# Patient Record
Sex: Male | Born: 1950
Health system: Southern US, Community
[De-identification: ages and names within clinical notes are randomized; demographics above are authoritative.]

## PROBLEM LIST (undated history)

## (undated) DIAGNOSIS — K5790 Diverticulosis of intestine, part unspecified, without perforation or abscess without bleeding: Secondary | ICD-10-CM

## (undated) DIAGNOSIS — R55 Syncope and collapse: Secondary | ICD-10-CM

## (undated) DIAGNOSIS — I10 Essential (primary) hypertension: Secondary | ICD-10-CM

## (undated) DIAGNOSIS — D126 Benign neoplasm of colon, unspecified: Secondary | ICD-10-CM

## (undated) DIAGNOSIS — I639 Cerebral infarction, unspecified: Secondary | ICD-10-CM

## (undated) DIAGNOSIS — E785 Hyperlipidemia, unspecified: Secondary | ICD-10-CM

## (undated) DIAGNOSIS — K449 Diaphragmatic hernia without obstruction or gangrene: Secondary | ICD-10-CM

## (undated) DIAGNOSIS — G459 Transient cerebral ischemic attack, unspecified: Secondary | ICD-10-CM

## (undated) DIAGNOSIS — M1990 Unspecified osteoarthritis, unspecified site: Secondary | ICD-10-CM

## (undated) DIAGNOSIS — Z923 Personal history of irradiation: Secondary | ICD-10-CM

## (undated) DIAGNOSIS — T7840XA Allergy, unspecified, initial encounter: Secondary | ICD-10-CM

## (undated) DIAGNOSIS — C029 Malignant neoplasm of tongue, unspecified: Secondary | ICD-10-CM

## (undated) DIAGNOSIS — D496 Neoplasm of unspecified behavior of brain: Secondary | ICD-10-CM

## (undated) DIAGNOSIS — R011 Cardiac murmur, unspecified: Secondary | ICD-10-CM

## (undated) DIAGNOSIS — C801 Malignant (primary) neoplasm, unspecified: Secondary | ICD-10-CM

## (undated) DIAGNOSIS — I219 Acute myocardial infarction, unspecified: Secondary | ICD-10-CM

## (undated) DIAGNOSIS — I739 Peripheral vascular disease, unspecified: Secondary | ICD-10-CM

## (undated) DIAGNOSIS — J449 Chronic obstructive pulmonary disease, unspecified: Secondary | ICD-10-CM

## (undated) HISTORY — PX: TRACHEOSTOMY: SUR1362

## (undated) HISTORY — PX: APPENDECTOMY: SHX54

## (undated) HISTORY — PX: BRAIN SURGERY: SHX531

## (undated) HISTORY — DX: Syncope and collapse: R55

## (undated) HISTORY — DX: Diverticulosis of intestine, part unspecified, without perforation or abscess without bleeding: K57.90

## (undated) HISTORY — DX: Diaphragmatic hernia without obstruction or gangrene: K44.9

## (undated) HISTORY — DX: Malignant (primary) neoplasm, unspecified: C80.1

## (undated) HISTORY — DX: Unspecified osteoarthritis, unspecified site: M19.90

## (undated) HISTORY — PX: TRACHEOSTOMY CLOSURE: SHX458

## (undated) HISTORY — DX: Hyperlipidemia, unspecified: E78.5

## (undated) HISTORY — DX: Chronic obstructive pulmonary disease, unspecified: J44.9

## (undated) HISTORY — PX: CHOLECYSTECTOMY: SHX55

## (undated) HISTORY — PX: ORBITAL FRACTURE SURGERY: SHX725

## (undated) HISTORY — PX: HERNIA REPAIR: SHX51

## (undated) HISTORY — DX: Allergy, unspecified, initial encounter: T78.40XA

## (undated) HISTORY — DX: Benign neoplasm of colon, unspecified: D12.6

## (undated) HISTORY — DX: Cardiac murmur, unspecified: R01.1

---

## 1998-09-27 ENCOUNTER — Ambulatory Visit (HOSPITAL_COMMUNITY): Admission: RE | Admit: 1998-09-27 | Discharge: 1998-09-27 | Payer: Self-pay | Admitting: *Deleted

## 1999-02-26 ENCOUNTER — Encounter: Payer: Self-pay | Admitting: Emergency Medicine

## 1999-02-26 ENCOUNTER — Emergency Department (HOSPITAL_COMMUNITY): Admission: EM | Admit: 1999-02-26 | Discharge: 1999-02-26 | Payer: Self-pay | Admitting: Emergency Medicine

## 1999-10-19 ENCOUNTER — Ambulatory Visit (HOSPITAL_COMMUNITY): Admission: RE | Admit: 1999-10-19 | Discharge: 1999-10-19 | Payer: Self-pay | Admitting: *Deleted

## 1999-11-24 ENCOUNTER — Encounter (INDEPENDENT_AMBULATORY_CARE_PROVIDER_SITE_OTHER): Payer: Self-pay

## 1999-11-24 ENCOUNTER — Ambulatory Visit (HOSPITAL_COMMUNITY): Admission: RE | Admit: 1999-11-24 | Discharge: 1999-11-24 | Payer: Self-pay | Admitting: *Deleted

## 2000-01-16 ENCOUNTER — Encounter: Payer: Self-pay | Admitting: Emergency Medicine

## 2000-01-17 ENCOUNTER — Inpatient Hospital Stay (HOSPITAL_COMMUNITY): Admission: EM | Admit: 2000-01-17 | Discharge: 2000-01-17 | Payer: Self-pay | Admitting: Emergency Medicine

## 2000-01-17 ENCOUNTER — Encounter: Payer: Self-pay | Admitting: Cardiovascular Disease

## 2000-03-01 ENCOUNTER — Ambulatory Visit (HOSPITAL_COMMUNITY): Admission: RE | Admit: 2000-03-01 | Discharge: 2000-03-01 | Payer: Self-pay | Admitting: Cardiovascular Disease

## 2000-11-07 ENCOUNTER — Encounter: Admission: RE | Admit: 2000-11-07 | Discharge: 2000-11-07 | Payer: Self-pay | Admitting: Cardiovascular Disease

## 2000-11-07 ENCOUNTER — Encounter: Payer: Self-pay | Admitting: Cardiovascular Disease

## 2000-11-08 ENCOUNTER — Encounter: Payer: Self-pay | Admitting: Cardiovascular Disease

## 2000-11-08 ENCOUNTER — Ambulatory Visit (HOSPITAL_COMMUNITY): Admission: RE | Admit: 2000-11-08 | Discharge: 2000-11-08 | Payer: Self-pay | Admitting: Cardiovascular Disease

## 2001-06-08 ENCOUNTER — Emergency Department (HOSPITAL_COMMUNITY): Admission: EM | Admit: 2001-06-08 | Discharge: 2001-06-08 | Payer: Self-pay | Admitting: Emergency Medicine

## 2001-06-08 ENCOUNTER — Encounter: Payer: Self-pay | Admitting: Emergency Medicine

## 2001-08-26 ENCOUNTER — Ambulatory Visit (HOSPITAL_COMMUNITY): Admission: RE | Admit: 2001-08-26 | Discharge: 2001-08-26 | Payer: Self-pay | Admitting: Internal Medicine

## 2001-10-23 ENCOUNTER — Encounter: Payer: Self-pay | Admitting: General Surgery

## 2001-10-27 ENCOUNTER — Observation Stay (HOSPITAL_COMMUNITY): Admission: RE | Admit: 2001-10-27 | Discharge: 2001-10-28 | Payer: Self-pay | Admitting: General Surgery

## 2001-11-21 ENCOUNTER — Emergency Department (HOSPITAL_COMMUNITY): Admission: EM | Admit: 2001-11-21 | Discharge: 2001-11-21 | Payer: Self-pay | Admitting: Emergency Medicine

## 2002-01-05 ENCOUNTER — Encounter: Payer: Self-pay | Admitting: Cardiovascular Disease

## 2002-01-05 ENCOUNTER — Ambulatory Visit (HOSPITAL_COMMUNITY): Admission: RE | Admit: 2002-01-05 | Discharge: 2002-01-05 | Payer: Self-pay | Admitting: Cardiovascular Disease

## 2002-07-28 ENCOUNTER — Observation Stay (HOSPITAL_COMMUNITY): Admission: RE | Admit: 2002-07-28 | Discharge: 2002-07-29 | Payer: Self-pay | Admitting: General Surgery

## 2003-02-08 ENCOUNTER — Inpatient Hospital Stay (HOSPITAL_COMMUNITY): Admission: EM | Admit: 2003-02-08 | Discharge: 2003-02-09 | Payer: Self-pay | Admitting: Emergency Medicine

## 2003-02-08 ENCOUNTER — Encounter: Payer: Self-pay | Admitting: Emergency Medicine

## 2003-02-09 ENCOUNTER — Encounter: Payer: Self-pay | Admitting: Cardiovascular Disease

## 2004-08-30 ENCOUNTER — Encounter: Admission: RE | Admit: 2004-08-30 | Discharge: 2004-08-30 | Payer: Self-pay | Admitting: General Surgery

## 2005-07-16 ENCOUNTER — Inpatient Hospital Stay (HOSPITAL_COMMUNITY): Admission: AD | Admit: 2005-07-16 | Discharge: 2005-07-17 | Payer: Self-pay | Admitting: Cardiovascular Disease

## 2005-11-03 ENCOUNTER — Emergency Department (HOSPITAL_COMMUNITY): Admission: EM | Admit: 2005-11-03 | Discharge: 2005-11-03 | Payer: Self-pay | Admitting: Emergency Medicine

## 2006-06-17 ENCOUNTER — Encounter: Admission: RE | Admit: 2006-06-17 | Discharge: 2006-06-17 | Payer: Self-pay | Admitting: Cardiovascular Disease

## 2006-08-07 ENCOUNTER — Inpatient Hospital Stay (HOSPITAL_COMMUNITY): Admission: EM | Admit: 2006-08-07 | Discharge: 2006-08-08 | Payer: Self-pay | Admitting: Emergency Medicine

## 2007-04-07 ENCOUNTER — Emergency Department (HOSPITAL_COMMUNITY): Admission: EM | Admit: 2007-04-07 | Discharge: 2007-04-07 | Payer: Self-pay | Admitting: Emergency Medicine

## 2007-08-23 ENCOUNTER — Emergency Department (HOSPITAL_COMMUNITY): Admission: EM | Admit: 2007-08-23 | Discharge: 2007-08-23 | Payer: Self-pay | Admitting: Emergency Medicine

## 2007-09-10 ENCOUNTER — Ambulatory Visit: Payer: Self-pay | Admitting: Internal Medicine

## 2007-09-23 ENCOUNTER — Encounter: Payer: Self-pay | Admitting: Internal Medicine

## 2007-09-23 ENCOUNTER — Ambulatory Visit: Payer: Self-pay | Admitting: Internal Medicine

## 2007-11-09 ENCOUNTER — Emergency Department (HOSPITAL_COMMUNITY): Admission: EM | Admit: 2007-11-09 | Discharge: 2007-11-09 | Payer: Self-pay | Admitting: Family Medicine

## 2008-03-18 ENCOUNTER — Encounter (INDEPENDENT_AMBULATORY_CARE_PROVIDER_SITE_OTHER): Payer: Self-pay | Admitting: Cardiovascular Disease

## 2008-03-18 ENCOUNTER — Inpatient Hospital Stay (HOSPITAL_COMMUNITY): Admission: EM | Admit: 2008-03-18 | Discharge: 2008-03-19 | Payer: Self-pay | Admitting: Emergency Medicine

## 2008-05-07 ENCOUNTER — Emergency Department (HOSPITAL_COMMUNITY): Admission: EM | Admit: 2008-05-07 | Discharge: 2008-05-07 | Payer: Self-pay | Admitting: Emergency Medicine

## 2008-05-12 ENCOUNTER — Ambulatory Visit: Payer: Self-pay | Admitting: Surgery

## 2008-05-12 ENCOUNTER — Ambulatory Visit (HOSPITAL_COMMUNITY): Admission: RE | Admit: 2008-05-12 | Discharge: 2008-05-12 | Payer: Self-pay | Admitting: Cardiovascular Disease

## 2008-05-12 ENCOUNTER — Encounter (INDEPENDENT_AMBULATORY_CARE_PROVIDER_SITE_OTHER): Payer: Self-pay | Admitting: Cardiovascular Disease

## 2008-09-30 ENCOUNTER — Encounter: Admission: RE | Admit: 2008-09-30 | Discharge: 2008-09-30 | Payer: Self-pay | Admitting: Cardiovascular Disease

## 2008-10-26 ENCOUNTER — Emergency Department (HOSPITAL_COMMUNITY): Admission: EM | Admit: 2008-10-26 | Discharge: 2008-10-26 | Payer: Self-pay | Admitting: Emergency Medicine

## 2008-11-19 ENCOUNTER — Encounter: Admission: RE | Admit: 2008-11-19 | Discharge: 2008-11-19 | Payer: Self-pay | Admitting: Neurology

## 2009-02-16 ENCOUNTER — Encounter (HOSPITAL_COMMUNITY): Admission: RE | Admit: 2009-02-16 | Discharge: 2009-05-17 | Payer: Self-pay | Admitting: Cardiovascular Disease

## 2010-06-13 ENCOUNTER — Encounter: Admission: RE | Admit: 2010-06-13 | Discharge: 2010-06-13 | Payer: Self-pay | Admitting: Cardiovascular Disease

## 2010-06-22 ENCOUNTER — Encounter: Admission: RE | Admit: 2010-06-22 | Discharge: 2010-06-22 | Payer: Self-pay | Admitting: Cardiovascular Disease

## 2010-07-13 ENCOUNTER — Inpatient Hospital Stay (HOSPITAL_COMMUNITY): Admission: EM | Admit: 2010-07-13 | Discharge: 2010-07-14 | Payer: Self-pay | Admitting: Emergency Medicine

## 2010-10-13 ENCOUNTER — Ambulatory Visit: Payer: Self-pay | Admitting: Vascular Surgery

## 2010-12-24 ENCOUNTER — Encounter: Payer: Self-pay | Admitting: Cardiovascular Disease

## 2011-01-16 ENCOUNTER — Other Ambulatory Visit (HOSPITAL_COMMUNITY): Payer: Self-pay | Admitting: Cardiovascular Disease

## 2011-01-16 DIAGNOSIS — I2510 Atherosclerotic heart disease of native coronary artery without angina pectoris: Secondary | ICD-10-CM

## 2011-02-01 ENCOUNTER — Ambulatory Visit (HOSPITAL_COMMUNITY)
Admission: RE | Admit: 2011-02-01 | Discharge: 2011-02-01 | Disposition: A | Discharge status: Home / Self Care | Payer: Medicare PPO | Source: Ambulatory Visit | Attending: Cardiovascular Disease | Admitting: Cardiovascular Disease

## 2011-02-01 DIAGNOSIS — R079 Chest pain, unspecified: Secondary | ICD-10-CM | POA: Insufficient documentation

## 2011-02-01 DIAGNOSIS — I2510 Atherosclerotic heart disease of native coronary artery without angina pectoris: Secondary | ICD-10-CM

## 2011-02-01 MED ORDER — TECHNETIUM TC 99M TETROFOSMIN IV KIT
30.0000 | PACK | Freq: Once | INTRAVENOUS | Status: AC | PRN
  Administered 2011-02-01: 30 via INTRAVENOUS

## 2011-02-01 MED ORDER — TECHNETIUM TC 99M TETROFOSMIN IV KIT
10.0000 | PACK | Freq: Once | INTRAVENOUS | Status: AC | PRN
  Administered 2011-02-01: 10 via INTRAVENOUS

## 2011-02-16 LAB — CBC
Hemoglobin: 13.9 g/dL (ref 13.0–17.0)
Hemoglobin: 15.4 g/dL (ref 13.0–17.0)
MCHC: 33.4 g/dL (ref 30.0–36.0)
MCV: 97 fL (ref 78.0–100.0)
Platelets: 207 10*3/uL (ref 150–400)
RBC: 4.28 MIL/uL (ref 4.22–5.81)
RBC: 4.64 MIL/uL (ref 4.22–5.81)
WBC: 7.3 10*3/uL (ref 4.0–10.5)

## 2011-02-16 LAB — CARDIAC PANEL(CRET KIN+CKTOT+MB+TROPI)
CK, MB: 4 ng/mL (ref 0.3–4.0)
Relative Index: 3.7 — ABNORMAL HIGH (ref 0.0–2.5)
Total CK: 104 U/L (ref 7–232)
Troponin I: 0.02 ng/mL (ref 0.00–0.06)

## 2011-02-16 LAB — POCT CARDIAC MARKERS
CKMB, poc: 4 ng/mL (ref 1.0–8.0)
Troponin i, poc: <0.05 ng/mL (ref 0.00–0.09)

## 2011-02-16 LAB — URINALYSIS, ROUTINE W REFLEX MICROSCOPIC
Glucose, UA: 100 mg/dL — AB
Ketones, ur: NEGATIVE mg/dL
Nitrite: NEGATIVE
Protein, ur: NEGATIVE mg/dL
Urobilinogen, UA: 1 mg/dL (ref 0.0–1.0)

## 2011-02-16 LAB — POCT I-STAT, CHEM 8
Chloride: 107 mEq/L (ref 96–112)
HCT: 49 % (ref 39.0–52.0)
Potassium: 3.6 mEq/L (ref 3.5–5.1)

## 2011-02-16 LAB — DIFFERENTIAL
Eosinophils Relative: 4 % (ref 0–5)
Lymphocytes Relative: 34 % (ref 12–46)
Lymphs Abs: 2.5 10*3/uL (ref 0.7–4.0)

## 2011-02-16 LAB — URINE CULTURE: Colony Count: NO GROWTH

## 2011-02-16 LAB — LIPID PANEL
Total CHOL/HDL Ratio: 3.7 RATIO
VLDL: 15 mg/dL (ref 0–40)

## 2011-02-16 LAB — PROTIME-INR
INR: 0.93 (ref 0.00–1.49)
Prothrombin Time: 12.7 seconds (ref 11.6–15.2)

## 2011-02-16 LAB — HEPARIN LEVEL (UNFRACTIONATED): Heparin Unfractionated: 0.22 IU/mL — ABNORMAL LOW (ref 0.30–0.70)

## 2011-04-12 ENCOUNTER — Observation Stay (HOSPITAL_COMMUNITY): Payer: Medicare PPO

## 2011-04-12 ENCOUNTER — Observation Stay (HOSPITAL_COMMUNITY)
Admission: AD | Admit: 2011-04-12 | Discharge: 2011-04-13 | Disposition: A | Discharge status: Home / Self Care | Payer: Medicare PPO | Source: Ambulatory Visit | Attending: Cardiovascular Disease | Admitting: Cardiovascular Disease

## 2011-04-12 DIAGNOSIS — I10 Essential (primary) hypertension: Secondary | ICD-10-CM | POA: Insufficient documentation

## 2011-04-12 DIAGNOSIS — R0602 Shortness of breath: Secondary | ICD-10-CM | POA: Insufficient documentation

## 2011-04-12 DIAGNOSIS — I739 Peripheral vascular disease, unspecified: Secondary | ICD-10-CM | POA: Insufficient documentation

## 2011-04-12 DIAGNOSIS — J4489 Other specified chronic obstructive pulmonary disease: Principal | ICD-10-CM | POA: Insufficient documentation

## 2011-04-12 DIAGNOSIS — R609 Edema, unspecified: Secondary | ICD-10-CM | POA: Insufficient documentation

## 2011-04-12 DIAGNOSIS — F411 Generalized anxiety disorder: Secondary | ICD-10-CM | POA: Insufficient documentation

## 2011-04-12 DIAGNOSIS — J449 Chronic obstructive pulmonary disease, unspecified: Secondary | ICD-10-CM | POA: Insufficient documentation

## 2011-04-12 DIAGNOSIS — Z8673 Personal history of transient ischemic attack (TIA), and cerebral infarction without residual deficits: Secondary | ICD-10-CM | POA: Insufficient documentation

## 2011-04-12 DIAGNOSIS — E785 Hyperlipidemia, unspecified: Secondary | ICD-10-CM | POA: Insufficient documentation

## 2011-04-12 DIAGNOSIS — M7989 Other specified soft tissue disorders: Secondary | ICD-10-CM

## 2011-04-12 DIAGNOSIS — F17200 Nicotine dependence, unspecified, uncomplicated: Secondary | ICD-10-CM | POA: Insufficient documentation

## 2011-04-12 LAB — COMPREHENSIVE METABOLIC PANEL
AST: 26 U/L (ref 0–37)
BUN: 9 mg/dL (ref 6–23)
CO2: 26 mEq/L (ref 19–32)
Calcium: 9.3 mg/dL (ref 8.4–10.5)
Creatinine, Ser: 0.89 mg/dL (ref 0.4–1.5)
GFR calc Af Amer: >60 mL/min (ref >60)
GFR calc non Af Amer: >60 mL/min (ref >60)
Glucose, Bld: 90 mg/dL (ref 70–99)
Total Bilirubin: 0.5 mg/dL (ref 0.3–1.2)

## 2011-04-12 LAB — CARDIAC PANEL(CRET KIN+CKTOT+MB+TROPI)
Total CK: 161 U/L (ref 7–232)
Troponin I: <0.3 ng/mL (ref <0.30)

## 2011-04-12 LAB — CBC
MCV: 95 fL (ref 78.0–100.0)
Platelets: 225 10*3/uL (ref 150–400)
RBC: 5.05 MIL/uL (ref 4.22–5.81)
WBC: 8.5 10*3/uL (ref 4.0–10.5)

## 2011-04-12 LAB — DIFFERENTIAL
Basophils Absolute: 0.1 10*3/uL (ref 0.0–0.1)
Eosinophils Absolute: 0.3 10*3/uL (ref 0.0–0.7)
Lymphocytes Relative: 33 % (ref 12–46)
Lymphs Abs: 2.8 10*3/uL (ref 0.7–4.0)
Neutrophils Relative %: 50 % (ref 43–77)

## 2011-04-12 LAB — TSH: TSH: 1.041 u[IU]/mL (ref 0.350–4.500)

## 2011-04-12 LAB — PRO B NATRIURETIC PEPTIDE: Pro B Natriuretic peptide (BNP): 58.3 pg/mL (ref 0–125)

## 2011-04-13 LAB — LIPID PANEL
HDL: 67 mg/dL (ref >39)
Total CHOL/HDL Ratio: 3 RATIO
VLDL: 16 mg/dL (ref 0–40)

## 2011-04-13 LAB — CBC
MCH: 33.3 pg (ref 26.0–34.0)
MCHC: 35.2 g/dL (ref 30.0–36.0)
Platelets: 197 10*3/uL (ref 150–400)
RBC: 4.99 MIL/uL (ref 4.22–5.81)
RDW: 13.7 % (ref 11.5–15.5)

## 2011-04-13 LAB — MAGNESIUM: Magnesium: 1.8 mg/dL (ref 1.5–2.5)

## 2011-04-13 LAB — BASIC METABOLIC PANEL
BUN: 8 mg/dL (ref 6–23)
CO2: 29 mEq/L (ref 19–32)
Chloride: 102 mEq/L (ref 96–112)
Glucose, Bld: 98 mg/dL (ref 70–99)
Potassium: 3.8 mEq/L (ref 3.5–5.1)

## 2011-04-13 LAB — CARDIAC PANEL(CRET KIN+CKTOT+MB+TROPI)
CK, MB: 2.8 ng/mL (ref 0.3–4.0)
Troponin I: <0.3 ng/mL (ref <0.30)

## 2011-04-13 LAB — HEPARIN LEVEL (UNFRACTIONATED): Heparin Unfractionated: 0.19 IU/mL — ABNORMAL LOW (ref 0.30–0.70)

## 2011-04-14 NOTE — Discharge Summary (Signed)
NAME:  [name], [name]                ACCOUNT NO.:  [id number]  MEDICAL RECORD NO.:  [id number]           PATIENT TYPE:  O  LOCATION:  2008                         FACILITY:  [facility]  PHYSICIAN:  Ricki Rodriguez, M.D.  DATE OF BIRTH:  28-Nov-1951  DATE OF ADMISSION:  04/12/2011 DATE OF DISCHARGE:  04/13/2011                              DISCHARGE SUMMARY   FINAL DIAGNOSES: 1. Chronic obstructive lung disease. 2. Shortness of breath. 3. Chronic leg edema. 4. Hypertension. 5. Tobacco use disorder. 6. Anxiety. 7. Status post cerebrovascular accident. 8. Hyperlipidemia. 9. Peripheral vascular disease.  DISCHARGE MEDICATIONS: 1. Xanax 0.25 mg one daily as needed. 2. Hydrochlorothiazide 12.5 mg 1 capsule daily. 3. Lisinopril 20 mg one daily. 4. Potassium chloride 10 mEq one daily. 5. Simvastatin 20 mg one at bedtime. 6. Aspirin 325 mg one daily. 7. Azor 5/20 mg one daily. 8. Cilostazol 100 mg one twice daily. 9. Clonidine 0.2 mg one twice daily. 10.Edarbi 80 mg one daily. 11.Isosorbide mononitrate 60 mg one daily. 12.Metoprolol tartrate 100 mg twice daily. 13.Nitroglycerin 0.4 mg one sublingual q. 5 minutes x3 as needed for     chest pain. 14.Vitamin D2 one daily. 15.The patient to discontinue taking Bystolic.  DISCHARGE DIET:  Low-sodium heart-healthy diet.  DISCHARGE ACTIVITY:  The patient to increase activity gradually as tolerated.  FOLLOWUP:  By Dr. Orpah Cobb in 2 weeks.  ADDITIONAL INSTRUCTIONS:  The patient to wear support stockings and take medications regularly and avoid salt in the diet.  HISTORY:  This 60 year old white male presented with leg edema and pain in the calf along with some shortness of breath.  The patient had a past history of coronary artery disease, hypertension, peripheral vascular disease and old cerebrovascular accident with left-sided weakness.  PHYSICAL EXAMINATION:  VITAL SIGNS:  Pulse 70, respirations 16, blood pressure 124/85, height  5 feet 8 inch, weight 194 pounds, body mass index of 29.5. HEENT:  The patient is normocephalic, atraumatic with hazel eyes. Conjunctivae pink.  Sclerae nonicteric.  He wears dentures and reading glasses.  Tongue pink and midline. NECK:  No JVD, no carotid bruit.  Full range of motion. LUNGS:  Clear bilaterally. HEART:  Normal S1-S2. ABDOMEN:  Soft and nontender but distended. EXTREMITIES:  No cyanosis, positive clubbing.  Positive 2+ edema. CNS:  Cranial nerves grossly intact.  The patient is right-handed. SKIN:  Warm and dry.  LABORATORY DATA:  Normal hemoglobin/hematocrit, WBC count, platelet count.  D-dimer borderline at 0.49.  B-natriuretic peptide normal at 58. Electrolytes showed potassium of 3.1, subsequent potassium was 3.8 after replacement.  BUN and creatinine normal.  Glucose normal.  CK-MB, troponin I normal x3.  Thyroid stimulating hormone level was 1.041. Lipid profile showed cholesterol of 200, HDL cholesterol of 67, LDL cholesterol of 117, magnesium level was 1.8.  Chest x-ray showed no acute cardiopulmonary disease.  HOSPITAL COURSE:  The patient was admitted to telemetry floor.  He underwent a 2-D echocardiogram that showed preserved LV systolic function with mild left ventricular hypertrophy and mild dilatation of the aortic root.  Doppler of his lower extremity was negative for DVT. His  leg edema subsided by elevation of the legs.  He was advised to wear support stocking, decrease salt and fluid intake and get followup by me in 2 weeks.     Ricki Rodriguez, M.D.     ASK/MEDQ  D:  04/13/2011  T:  04/14/2011  Job:  [id number]  Electronically Signed by Orpah Cobb M.D. on 04/14/2011 02:24:13 PM

## 2011-04-17 NOTE — Consult Note (Signed)
NEW PATIENT CONSULTATION   [name], [name]  DOB:  Oct 19, 1951                                       10/13/2010  BJYNW#:[id number]   REASON FOR CONSULTATION:  Bilateral cold feet.   HISTORY OF PRESENT ILLNESS:  This is a 60 year old gentleman with chief  complaint of bilateral cold feet.  He has had these symptoms for about 2  years he says.  He says that with drop in temperature he notes that his  feet change colors.  At one point last year previously, they turned  purple and he developed pain in his feet.  He is not absolutely certain  whether or not he developed any anesthesia or paresthesia during this  episode.  He denies any claudication symptoms or rest pain symptoms.  Mainly his complaint of severe coldness in his legs during these  episodes.  He denies any known previous connective tissue disorder and  no autoimmune disorders.  Additionally,  he denies ever been diagnosed  with any type of Raynaud's phenomenon.   PAST MEDICAL HISTORY:  1. Hypertension.  2. History of stroke.  3. Skin cancer in his left ear.  4. History of left eye trauma.  5. Coronary artery disease.   PAST SURGICAL HISTORY:  Included a cardiac catheterization, hiatal  hernia surgery of some type, cholecystectomy, appendectomy and left eye  surgery.   SOCIAL HISTORY:  He does have a significant smoking history with greater  than 40 pack year history of smoking, continuing to smoke at least 1  pack a day.  He drinks about 6 Beers a month.   MEDICATIONS:  Include metoprolol, Pletal, Azor, Plavix, clonidine,  Tylenol,  isosorbide ER, nitroglycerin.   ALLERGIES:  Codeine.   REVIEW OF SYSTEMS:  He noted weight gain, headache, pain in legs when  walking, pain in feet when lying flat, mini-strokes, slurred speech,  bronchitis, chest tightness, pressure, shortness breath with lying flat,  shortness breath with exertion, hiatal hernia.   PHYSICAL EXAMINATION:  Vitals: Blood pressure  __________ , heart rate  78, respirations were 12, satting 99% in room air.  General: He is well-developed, well-nourished, no apparent distress.  Head:  Normocephalic, atraumatic.  ENT:  Hearing was grossly intact.  Nares without any erythema or  drainage.  Oropharynx had no erythema or exudate.  Neck: supple neck without nuchal rigidity, no JVD  Pulmonary:  Clear auscultation bilaterally, symmetric expansion.  No  rales, rhonchi or wheezing.  Cardiac:  Regular rate and rhythm.  Normal S1 and S2.  He had a grade  2/6 murmur in multiple listening areas.  No rubs, thrills or gallops.  Vascular:  Palpable upper extremity pulses throughout.  Carotids were  palpable and had no bruits on either side.  He had palpable femoral  pulses.  I do not appreciate popliteals but he had easily palpable  dorsalis pedis pulses bilaterally and there were faint posterior tibial  pulses bilaterally.  GI:  He had a soft abdomen, nontender, nondistended, no guarding.  No  hepatosplenomegaly. No obvious masses.  Musculoskeletal: All extremities had 5/5 strength.  There were no  obvious ischemic changes in either lower extremities.  No signs of any  type of gangrene or ulceration in any extremities.  Neurologic:  Cranial nerves II-XII were intact.  Motor exam was as  listed.  He had intact sensation  in all extremities include the lower  extremities.  Psychiatric:  Judgment appear to be intact.  Mood and affect were  appropriate for clinical situation.  Skin and extremities:  As listed above.  Otherwise there was no obvious  rash in the body.  Lymphatics:  No cervical, axillary, inguinal lymphadenopathy.   NON-INVASIVE VASCULAR IMAGING:  He had bilateral ABI's completed which  demonstrate on the right side 0.97 and on the left 1.31. Looking at the  waveforms, there were biphasic waveforms in the right posterior tibial  and anterior tibial.  On the left side, similarly there was a biphasic  waveform in the  posterior tibial and anterior tibial.  Also he had toe  pressures completed which gave a toe brachial index of 0.74 on the right  side and 0.78 on the left side.   MEDICAL DECISION MAKING:  This is a 60 year old gentleman who by history  has what is suggestive of possible Raynaud phenomenon.  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  Unfortunately, in most cases of primary, workup fails to reveal exact  etiology.  His eliciting trigger seems to be the cold weather, so  obviously I recommended to him that he minimize his exposure to the cold  weather, wearing thermal undergarments as necessary to avoid this.  Additionally if  he develops a severe episode of vasospastic behavior  and his legs turn blue, I have instructed him to come immediately to the  clinic to be seen.  I will, at that point, repeat ABIs to document the  severity of this and then this would also be the point of highest yield  for obtaining labwork in this patient.  I think at this point with  normal ABIs and normal toe pressures it would be low yield to draw the  labs or do any additional workup at this point.  I also stressed to him  the importance of smoking cessation.  We talked about 5 minutes about  the need to terminate his smoking.  He was not interested in any  counseling or pharmacologic assistance at this point.  We discussed this  plan.  He is fine with it at this point and he will follow up with [place] if  he has a severe exacerbation of his likely primary Raynaud's phenomena.     Leonides Sake, MD  Electronically Signed   BC/MEDQ  D:  10/13/2010  T:  10/16/2010  Job:  [id number]   cc:   Ricki Rodriguez, M.D.

## 2011-04-17 NOTE — Consult Note (Signed)
NAME:  DIAMANTE, RUBIN                ACCOUNT NO.:  [id number]   MEDICAL RECORD NO.:  [id number]          PATIENT TYPE:  INP   LOCATION:  4731                         FACILITY:  [facility]   PHYSICIAN:  Pramod P. Pearlean Brownie, MD    DATE OF BIRTH:  [birth year]   DATE OF CONSULTATION:  DATE OF DISCHARGE:                                 CONSULTATION   REASON FOR REFERRAL:  Stroke.   HISTORY OF PRESENT ILLNESS:  Mr. Rhoads is a 60 year old Caucasian male  who walked in this morning to Dr. Roseanne Kaufman office complaining of chest  pain and left hand weakness.  He was referred to the emergency room when  Dr. Algie Coffer found significant left-sided weakness.  He thought the  patient's symptoms began at 7:30 and hence he called me and I advised  him to call a Code Stroke.  However, when I arrived and examined the  patient, he told me clearly that his symptoms began this morning when he  woke up from sleep at around 7:00 a.m., he went to sleep at 930.  The  patient's symptoms were clearly beyond 3 hours and he is not a candidate  for aggressive intervention and Code Stroke was cancelled.  The patient  has no known prior history of stroke, TIA, seizures or significant  neurological problems.  He did have a brain surgery at 66 months of age  for presumed brain hemorrhage.  He also had some orbital hematoma  following a blunt head trauma for which he required surgery in 1970.   PAST MEDICAL HISTORY:  Significant for coronary artery disease,  myocardial infarction, hypertension.   HOME MEDICATIONS:  Aspirin, Lipitor, Lopressor, Norvasc.   ALLERGIES TO MEDICATIONS:  CODEINE.   FAMILY HISTORY:  Noncontributory.   SOCIAL HISTORY:  Lives in [place].  He is currently smoking.  Does  drink alcohol occasionally.  Denies doing drugs.  He is independent in  activities of daily living.   REVIEW OF SYSTEMS:  Positive for weakness, numbness, chest pain.   PHYSICAL EXAMINATION:  GENERAL:  Reveals a pleasant,  middle-aged  Caucasian male who is not in distress, afebrile, temperature 96.5, blood  pressure is 160/110, heart rate is 79 per minute regular, respiratory  rate 20 per minute, sats 96% on room air.  HEAD:  Nontraumatic.  NECK:  Supple.  There is no bruit.  ENT exam unremarkable.  CARDIAC:  No murmur or gallop.  LUNGS:  Clear to auscultation.  ABDOMEN:  Soft, nontender.  NEUROLOGICAL:  The patient is awake, alert.  He is oriented to time,  place and person.  There is no aphasia, apraxia or dysarthria and eye  movements are full range.  There is no facial weakness.  Tongue is  midline.  MOTOR SYSTEM EXAM:  Reveals no upper extremity drift, but he clearly has  significant weakness in the left grip and intrinsic hand muscles.  Fine  finger movements is diminished on the left.  He orbits the right over  left upper extremity.  He has left lower extremity drift.  He has mild  weakness of hip flexors and  ankle dorsiflexors.  He has decreased  sensation in the left hemibody.  On NIH stroke scale, he scored 3.   DIAGNOSTIC DATA:  Data reviewed noncontrast CAT scan of the head shows  no acute abnormality.   Admission Labs are pending at this time.   IMPRESSION:  A 56-year gentleman with sudden onset of left hemiparesis  likely due to right subcortical infarct.  He is also having ongoing  chest pain and coronary ischemia and also needs to be ruled out.   PLAN:  The patient is clearly not a candidate for thrombolysis due to  time of onset being beyond 3 hours.  He will be admitted for stroke  workup.  Keep him on telemetry monitoring.  Check MRI scan of the brain  with MRA of the brain, carotid ultrasound, transcranial Doppler studies,  fasting lipid profile, hemoglobin A1c, and homocystine.  Consider  changing aspirin to Plavix for secondary stroke prevention if the  patient can afford it.  IV heparin for chest pain treatment as per Dr.  Algie Coffer. I will happy to follow the patient in  consult.  Kindly call for  questions.           ______________________________  Sunny Schlein. Pearlean Brownie, MD     PPS/MEDQ  D:  03/18/2008  T:  03/19/2008  Job:  [id number]

## 2011-04-20 NOTE — H&P (Signed)
NAME:  [name], [name]                ACCOUNT NO.:  [id number]   MEDICAL RECORD NO.:  [id number]          PATIENT TYPE:  INP   LOCATION:  4735                         FACILITY:  [facility]   PHYSICIAN:  Ricki Rodriguez, M.D.  DATE OF BIRTH:  [birth year]   DATE OF ADMISSION:  07/16/2005  DATE OF DISCHARGE:                                HISTORY & PHYSICAL   CHIEF COMPLAINT:  Chest pain.   HISTORY OF PRESENT ILLNESS:  This 60 year old white male had exertional  dyspnea with chest pain for three days with left hand numbness. No history  of nausea and no nitroglycerin use.   PAST MEDICAL HISTORY:  Negative for diabetes. Positive for hypertension.  Positive for smoking one pack of cigarettes a day for 35 years. Positive for  alcohol use. Negative for elevated cholesterol level, myocardial infarction,  obesity, exercise, or family history of premature coronary artery disease.   PAST SURGICAL HISTORY:  1.  Gallbladder surgery in 1996.  2.  Left eye surgery in 1994.  3.  Appendectomy in the 1970s.  4.  Subdural hematoma evacuation at age 22 months.  5.  Polyp removal by Dr. Dortha Kern in 2001.   MEDICATIONS:  1.  Ecotrin 325 mg one daily.  2.  Lopressor 25 mg b.i.d.  3.  Lipitor 10 mg one daily.   ALLERGIES:  CODEINE.   PERSONAL HISTORY:  The patient is married x2. One son died at age 52 years.  He has one daughter at 18 years of age and another daughter 64 years of age.  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   FAMILY HISTORY:  Parents not known. Had two half brothers and one sister.   REVIEW OF SYSTEMS:  Denies weight gain or weight loss. Wears glasses. No  cataract surgery. Denies hearing loss. Tinnitus, rhinorrhea. Wears full  upper dentures. No history of cough, hemoptysis, asthma, or pneumonia. No  history of palpitations, dizziness or chest pain. No history of nausea,  vomiting, diarrhea, or constipation. Positive history of chronic  obstructive  lung disease, leg claudication, draining from the bowels, stomach ulcer,  hiatal hernia, history of blood transfusion, and hematuria. No history of  hepatitis, kidney stones, strokes, seizures, or psychiatric admission.  Positive history of joint pain. History of tetanus diphtheria shot in 2000.  No history of flu shot or pneumonia shot.   PHYSICAL EXAMINATION:  VITAL SIGNS: Pulse 80, respirations 20, blood  pressure 160/70, height 5 feet 8 inches, weight 168 pounds.  HEENT: Patient is normocephalic and atraumatic with hazel eyes. Conjunctiva  pink. Sclerae nonicteric. Mucous membranes pink and moist.  NECK: No JVD, no carotid bruits.  LUNGS: Clear bilaterally.  HEART: Normal S1 and S2 with a grade 2/6 systolic murmur.  ABDOMEN: Soft and nontender.  EXTREMITIES: No clubbing, cyanosis, or edema.  CNS: Cranial nerves II-XII grossly intact and the patient moves all four  extremities.   LABORATORY DATA:  Pending.   EKG reveals normal sinus rhythm with high voltage QRS.   ASSESSMENT:  1.  Chest  pain. Rule out myocardial infarction.  2.  Hypertension.  3.  Chronic obstructive lung disease.   PLAN:  1.  Admit the patient to telemetry bed.  2.  Check serial CK-MB.  3.  Schedule the patient for cardiac catheterization.      Ricki Rodriguez, M.D.  Electronically Signed     ASK/MEDQ  D:  07/17/2005  T:  07/17/2005  Job:  [id number]

## 2011-04-20 NOTE — Discharge Summary (Signed)
NAME:  [name], [name]                ACCOUNT NO.:  [id number]   MEDICAL RECORD NO.:  [id number]          PATIENT TYPE:  INP   LOCATION:  4735                         FACILITY:  [facility]   PHYSICIAN:  Ricki Rodriguez, M.D.  DATE OF BIRTH:  1951/01/01   DATE OF ADMISSION:  07/16/2005  DATE OF DISCHARGE:  07/17/2005                                 DISCHARGE SUMMARY   PRINCIPAL DIAGNOSIS:  1.  Chest pain.  2.  Chronic obstructive lung disease.  3.  Hypertension.  4.  Coronary artery disease.  5.  Abnormal electrocardiogram.   DISCHARGE MEDICATIONS:  Aspirin 81 mg one daily, Lopressor 25 mg one b.i.d.,  Lipitor 20 mg one daily.   DISCHARGE INSTRUCTIONS:  Diet low fat, low salt diet.  Activities:  Increase  slowly.  May shower after 24 hours and no sexual activity for one week.  Follow up with Dr. Orpah Cobb in 1-2 weeks.  Referral to smoking  cessation.  The patient will notify for right groin pain, swelling, or  discharge.   CONDITION ON DISCHARGE:  Improved.   HISTORY:  This 60 year old white male had exertional dyspnea with chest pain  for 3-4 days without history of nausea or nitroglycerin use.  His past  medical history is positive for hypertension, smoking one pack cigarettes  per day for 35 years, alcohol use, no history of diabetes, elevated  cholesterol level, or myocardial infarction.   PHYSICAL EXAMINATION:  Pulse 80, respirations 20, blood pressure 160/70,  height 5 feet 8 inches, weight 168 pounds.  HEENT:  The patient is  normocephalic, atraumatic, with hazel eyes, conjunctivae pink, sclerae  nonicteric, mucous membranes pink and moist.  Neck:  No JVD, no carotid  bruit.  Lungs clear bilaterally.  Heart:  Normal S1 and S2, grade 2/6  systolic murmur.  Abdomen:  Soft and nontender.  Extremities:  No edema,  cyanosis, or clubbing.  CNS:  Cranial nerves grossly intact, the patient  moves all four extremities.   LABORATORY DATA:  Normal hemoglobin, hematocrit, near normal  WBC count and  platelet count.  Normal electrolytes, BUN, creatinine, and liver enzymes.  Normal CK, MB, troponin I.  Cholesterol 187, triglycerides elevated at 273,  LDL cholesterol 79, HDL cholesterol 53.  Chest x-ray no acute disease.  Cardiac catheterization showed minimal one vessel disease with mild left  ventricular systolic dysfunction.   HOSPITAL COURSE:  The patient was admitted to the telemetry unit, myocardial  infarction was ruled out.  He underwent cardiac catheterization that failed  to show significant coronary artery disease.  He had mild left ventricular  systolic dysfunction.  The patient was discharged home in satisfactory  condition with follow up by me in one week.      Ricki Rodriguez, M.D.  Electronically Signed     ASK/MEDQ  D:  10/12/2005  T:  10/12/2005  Job:  [id number]

## 2011-04-20 NOTE — Op Note (Signed)
[place]  Patient:    [name]                        MRN: [id number] Proc. Date: 10/19/99 Adm. Date:  [id number] Attending:  Sharyn Dross                           Operative Report  REFERRING PHYSICIAN:  Dortha Kern, Montez Hageman., M.D.  OBJECTIVELY:  This pleasant 60 year old gentleman presented to the office with complaints of tarry stools, that was present at that time.  He was seen and evaluated and underwent a flexible sigmoidoscope, which appeared to be unremarkable at this time.  He was subsequently scheduled for an endoscopy to rule out any evidence of any occult process that may be ongoing.  During the process, the patient had some abdominal pains and discomforts, but appeared to be mild in nature.  He had more burning sensation that was noted.  OBJECTIVE FINDINGS:  He is a pleasant gentleman, who appears to be in no acute distress.  His vital signs are stable.  His HEENT examination is anicteric. NECK was supple.  LUNGS are clear.  HEART had a regular rate and rhythm without heaves, thrills, murmurs or gallops.  The ABDOMEN was soft, no tenderness, no hepatosplenomegaly appreciated.  EXTREMITIES are unremarkable.  PLAN:  I am going to presently schedule the patient for an endoscopic examination. Presently going to proceed with the endoscopic examination at this time.  INFORMED CONSENT:  The patient was advised of the procedure, indications and the risks involved.  The patient has agreed to have the procedure performed.  Video was viewed and consent form was obtained.  PREOPERATIVE PREPARATION:  The patient is brought in the endoscopy unit with IV, where IV conscious sedating medication was started.  A monitor is placed on the  patient to monitor the patients vital signs and oxygen saturation.  Nasal oxygen at 2 L/min was used and after adequate sedation was performed, the procedure was begun.  PROCEDURE NOTE:  The  instrument is advanced, patient lying in left lateral position via direct technique without difficulty.  The oropharyngeal, epiglottis, vocal cords, and piriform sinuses appeared to be grossly within normal limits.  The esophagus was normal without any evidence of acute inflammation, ulcerations, hiatal hernia or varices appreciated.  The gastric area showed a normal mucosa lake without any type of appearance of he gastric body and antral area that was noted.  This is consistent with a possible mild inflammatory process that was present.  There was no evidence of any acute  ulcerations that was noted at this time or erosions appreciated.  The pylorus is normal with good peristaltic activity and upon advancing through the pyloric canal, the duodenal bulb and second portion appeared to show evidence of mild duodenal  erosions that was appreciated.  The instrument was subsequently retracted back,  where retroflexed view of the cardia revealed no gross pathology.  The Z-line appeared to be approximately 40 cm distal esophagus, but noted at this region, there appeared to be evidence consistent with cardia inflammation and questionable mild grade I distal esophagitis that was appreciated.  The instrument was subsequently retracted back and removed per orum without difficulty with the patient tolerating the procedure well.  TREATMENT: 1. I am going to treat the patient with the Axid at this time, which he is    presently taking. 2. Will  have him follow-up in the office within the next few weeks. 3. May need to add a prokinetic agent during the interim to see if this may improve    his ability or give him samples of a PTI to possibly see if this helps to    improve his symptoms.  Depending upon these results will determine the course of    therapy. DD:  10/19/99 TD:  10/20/99 Job: [id number]

## 2011-04-20 NOTE — Cardiovascular Report (Signed)
NAME:  [name], [name]                ACCOUNT NO.:  [id number]   MEDICAL RECORD NO.:  [id number]          PATIENT TYPE:  INP   LOCATION:  4735                         FACILITY:  [facility]   PHYSICIAN:  Ricki Rodriguez, M.D.  DATE OF BIRTH:  12-07-1950   DATE OF PROCEDURE:  07/17/2005  DATE OF DISCHARGE:                              CARDIAC CATHETERIZATION   PROCEDURES:  1.  Left heart catheterization.  2.  Selective coronary angiography.  3.  Left ventricle function study.   INDICATIONS:  This 59 year old white male had a 3-day history of chest pain,  along with an abnormal EKG, hypertension and chronic obstructive lung  disease.   APPROACH:  Right femoral artery using 4-French sheath and catheters.   COMPLICATIONS:  None.   HEMODYNAMIC DATA:  1.  Left ventricular pressure:  132/13.  2.  Aortic pressure:  132/82.   CORONARY ANATOMY:  1.  LEFT MAIN CORONARY ARTERY:  Unremarkable.  2.  LEFT ANTERIOR DESCENDING CORONARY ARTERY:  Also unremarkable. There was      a diagonal branch 1 to OM3 also unremarkable.  3.  LEFT CIRCUMFLEX CORONARY ARTERY:  Unremarkable. Obtuse Marginal branches      1 and 2 were unremarkable.  4.  RIGHT CORONARY ARTERY:  Dominant; had luminal irregularities proximally,      otherwise, was unremarkable.  Posterolateral branch and Posterior      descending coronary artery were unremarkable.   LEFT VENTRICULOGRAM:  The left ventriculogram showed mild anterior and  inferior hypokinesia, with a 45-50% ejection fraction.   IMPRESSION:  1.  Minimal one-vessel coronary artery disease.  2.  Mild left systolic dysfunction.   RECOMMENDATIONS:  This patient will be treated medically with smoking  cessation, and medication and dietary compliance.      Ricki Rodriguez, M.D.  Electronically Signed     ASK/MEDQ  D:  07/17/2005  T:  07/17/2005  Job:  [id number]

## 2011-04-20 NOTE — Discharge Summary (Signed)
NAME:  [name], [name]                ACCOUNT NO.:  [id number]   MEDICAL RECORD NO.:  [id number]          PATIENT TYPE:  INP   LOCATION:  2018                         FACILITY:  [facility]   PHYSICIAN:  Ricki Rodriguez, M.D.  DATE OF BIRTH:  04-Jan-1951   DATE OF ADMISSION:  08/07/2006  DATE OF DISCHARGE:  08/08/2006                                 DISCHARGE SUMMARY   HOSPITAL LOCATION:  2118, bed 1.   PRINCIPAL DIAGNOSES:  1. Chest pain.  2. Native coronary atherosclerosis.  3. Chronic obstructive lung disease.  4. Hypertension.  5. Hyperlipidemia.  6. Peptic ulcer disease.  7. Diabetes mellitus type 2.   PRINCIPAL PROCEDURE:  Left heart catheterization, selective left  angiography, left heart study done by Dr. Orpah Cobb August 07, 2006.   DISCHARGE MEDICATIONS:  1. Prilosec 20 mg 1 twice daily.  2. Aspirin 81 mg 1 day.  3. Zetia 10 mg 1 daily.  4. Metoprolol 25 mg 1/2 twice daily.  5. Nitroglycerin 0.4 mg 1 sublingual every 5 minutes x3 daily.  6. Take Tylenol as needed.   DISCHARGE DIET:  Low-fat, low-salt diet.   DISCHARGE ACTIVITY:  As tolerated.   WOUND CARE:  The patient to notify if right groin pain swelling or  discharge.   FOLLOWUP:  By Dr. Orpah Cobb in two weeks.  The patient to call [phone]  for an appointment.   HISTORY:  This 60 year old white man had non-exertional chest pain of three  hours duration with nausea and sweating spell.  The patient was increased  with cough and motion, but some of it was reproducible by palpation also.  The patient has a known history of hypertension, smoking, elevated  cholesterol level, and mild coronary artery disease in the past.   PHYSICAL EXAMINATION:  GENERAL:  The patient is averagely built and  nourished white male in no acute distress.  VITAL SIGNS:  Pulse 104, respirations 20, blood pressure 123/71, temperature  97.1, pulse oximetry 95% on room air.  The patient is 5 feet, 8 inches tall  and weighed  approximately 168 pounds.  HEENT:  The patient is normocephalic, atraumatic.  Has hazel eyes.  Conjunctivae are pink.  Sclerae are nonicteric.  Mucous membranes are pink  and moist.  NECK:  No JVD.  No carotid bruit.  LUNGS:  Clear bilaterally with a few basal crackles posteriorly.  HEART:  Normal S1, S2.  Grade 2/6 systolic murmur without S3 gallop.  ABDOMEN:  Soft with mild epigastric tenderness.  Bowel sounds normal.  EXTREMITIES:  No edema or cyanosis, but positive for mild clubbing.  CNS:  Cranial nerves grossly intact and the patient moves all four  extremities.   LABORATORY DATA:  Normal cardiac enzymes.  Sodium, potassium, BUN,  creatinine normal.  Hemoglobin and hematocrit normal.  EKG normal sinus  rhythm.   CHEST X-RAY:  Lungs clear.  Heart size normal.   CARDIAC CATHETERIZATION:  Showed mild non-critical coronary artery disease  and mild left ventricular systolic dysfunction with ejection fraction of 50%  and inferior wall hyperkinesia.   HOSPITAL COURSE:  The  patient was admitted to telemetry unit.  Myocardial  infarction was ruled out.  Because of her prolonged chest pain and known  history of coronary artery disease, the patient underwent cardiac  catheterization.  This showed non-critical coronary artery disease and mild  left ventricular systolic dysfunction.   The patient's medications were adjusted and he was discharged home in  satisfactory condition on August 08, 2006 with a followup by me in two  weeks.      Ricki Rodriguez, M.D.  Electronically Signed     ASK/MEDQ  D:  10/03/2006  T:  10/04/2006  Job:  [id number]

## 2011-04-20 NOTE — H&P (Signed)
[facility]  Patient:    [name], [name] Visit Number: [id number] MRN: [id number]          Service Type: SUR Location: 4W 0445 01 Attending Physician:  Arlis Porta Dictated by:   Adolph Pollack, M.D. Admit Date:  10/27/2001                           History and Physical  REASON FOR ADMISSION:  Elective laparoscopic hiatal hernia repair and Nissen fundoplication.  HISTORY OF PRESENT ILLNESS:  [name] is a 60 year old male who had been suffering from gastroesophageal reflux disease.  He has been treated adequately medically and is currently on Nexium b.i.d., but currently still has ongoing reflux symptoms.  He has undergone an extensive evaluation by Wilhemina Bonito. Eda Keys., M.D.  An upper endoscopy of January 2002 demonstrated erosive esophagitis and a hiatal hernia.  He had a 24-hour pH study done.  The 24-hour pH study was reported to be within normal limits.  However, the person who was placing the probe wondered if it was in the right position.  His manometry study demonstrated normal esophageal motility.  He continues to have classic symptoms.  He now presents for elective laparoscopic Nissen fundoplication. Procedure and risks were explained to him preoperatively.  PAST MEDICAL HISTORY: 1. Hiatal hernia with gastroesophageal reflux. 2. Cholelithiasis. 3. Epigastric incisional hernia. 4. Hypertension.  PAST SURGICAL HISTORY: 1. Laparoscopic cholecystectomy. 2. Repair of epigastric incisional hernia. 3. Brain surgery. 4. Appendectomy. 5. Left eye surgery.  CURRENT MEDICATIONS:  Nexium 40 mg p.o. b.i.d.  SOCIAL HISTORY:  He smokes two packs of cigarettes per day.  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  REVIEW OF SYSTEMS:  No known coronary artery disease.  No pulmonary disease. He does have a cough, however.  He does have some tattoos.  PHYSICAL EXAMINATION:  VITAL SIGNS:  Temperature 97,  blood pressure 160/92, pulse 101.  GENERAL:  Well-developed, well-nourished male in no acute distress.  Very pleasant and cooperative.  HEENT:  Eyes:  Extraocular motions are intact.  No scleral icterus.  NECK:  Supple without palpable mass.  LUNGS:  Breath sounds equal and clear, but they are distant.  HEART:  Heart demonstrates a slightly increased rate with regular rhythm.  ABDOMEN:  Soft, nontender.  There is a right paramedian scar and multiple small scars including one in the epigastrium.  There is a small diastasis recti noted.  No palpable organomegaly or masses.  EXTREMITIES:  He has some clubbing in fingers.  No edema noted.  IMPRESSION: 1. Hiatal hernia, refractory to medications. 2. Gastroesophageal reflux disease, refractory to medications.  PLAN:  Although the 24-hour pH study was reported as normal, he does have documented erosive esophagitis and classic symptoms as well as a hiatal hernia.  Laparoscopic hiatal hernia repair and Nissen fundoplication. Dictated by:   Adolph Pollack, M.D. Attending Physician:  Arlis Porta DD:  10/27/01 TD:  10/27/01 Job: [id number]

## 2011-04-20 NOTE — Discharge Summary (Signed)
NAME:  [name], HEMANN                ACCOUNT NO.:  [id number]   MEDICAL RECORD NO.:  [id number]          PATIENT TYPE:  INP   LOCATION:  4731                         FACILITY:  [facility]   PHYSICIAN:  Ricki Rodriguez, M.D.  DATE OF BIRTH:  [birth year]   DATE OF ADMISSION:  03/18/2008  DATE OF DISCHARGE:  03/19/2008                               DISCHARGE SUMMARY   FINAL DIAGNOSES:  1. Subcortical parietal infarction.  2. Left-sided hemiparesis.  3. Hypertension.  4. Tobacco use disorder.  5. Hyperlipidemia.  6. Coronary atherosclerosis of native coronary vessel.  7. Old myocardial infarction.  8. Coronary catheter obstruction.  9. Peptic ulcer disease.  10.Diabetes mellitus type 2.   DISCHARGE MEDICATIONS:  1. Aspirin 81 mg 1 daily.  2. Plavix 75 mg 1 daily.  3. Metoprolol 50 mg half twice daily.  4. Clonidine 0.1 mg 1 twice daily.  5. Lipitor 20 mg 1 daily.  6. Azor 10/20 one daily.   DISCHARGE DIET:  Low-sodium, heart-healthy diet.   WOUND CARE:  Not applicable.   ACTIVITY:  The patient to increase activity slowly.   SPECIAL INSTRUCTIONS:  The patient to stop any activity that causes  chest pain, shortness of breath, dizziness, sweating, or excessive  weakness.  Follow up by Dr. Orpah Cobb in 1 month. The patient to call  [phone] for appointment and by Dr. Delia Heady in 1 month.   HISTORY:  This 60 year old white male came to the office complaining of  left arm weakness when he woke up.  There was no loss of consciousness  and no speech problem.  Also, he had some left-sided chest pain, upon  walking 200-300 feet.  He had risk factors of hypertension and smoking  for 35 years.   PHYSICAL EXAMINATION:  VITAL SIGNS:  Temperature 98, pulse 76,  respirations 20, and blood pressure 155/95.  Height 5 feet 8 inches.  Weight 180 pounds.  GENERAL:  The patient is averagely built, well nourished in no acute  distress.  HEENT:  He is normocephalic and atraumatic with pupils  equally reactive  to light.  Extraocular movement intact. Conjunctivae pink.  Sclerae  nonicteric.  NECK:  No JVD and no carotid bruit.  LUNGS:  Decreased air entry at the bases.  HEART:  Normal S1 and S2 with grade 2/6 systolic murmur.  ABDOMEN:  Soft and distended.  EXTREMITIES:  Negative edema or cyanosis, positive clubbing.  SKIN:  Warm.  NEUROLOGIC:  Tongue midline.  The patient moved all 4 extremities, but  the left hand grip is decreased and decreased left upper and lower  extremity strength.   LABORATORY DATA:  Revealed normal hemoglobin, hematocrit, WBC count, and  platelet count.  Normal PT/INR.  Normal electrolytes, BUN, and  creatinine.  Glucose borderline 811.  Liver enzymes slightly high, AST  of 78 and ALT of 118.  Hemoglobin A1C is 5.6.  Cholesterol elevated at  251.  HDL cholesterol of 66.  LDL cholesterol of 170.  TSH of 1.15.   MRI/MRA of the head showed diffuse bilateral cerebral atrophy and  attenuation in the  MCA and PCA branches bilaterally.   EKG revealed sinus rhythm with marked sinus arrhythmia.   HOSPITAL COURSE:  The patient was admitted to telemetry bed.  Stroke  team was called.  The patient was not a candidate for thrombolytic  therapy.  Since he woke up with left-sided stroke, it was not clear for  how long he had the stroke.  His overall condition improved within 24  hours of hospitalization.  The patient requested early discharge due to  lack of insurance, and he will be followed by me and by Dr. Lum Keas on outpatient basis.  He understood he could stay few days and get  inpatient rehabilitation. Instructions were given for rehab and for  exercises and medications.  Samples were provided from the office.      Ricki Rodriguez, M.D.  Electronically Signed     ASK/MEDQ  D:  04/07/2008  T:  04/08/2008  Job:  [id number]

## 2011-04-20 NOTE — Cardiovascular Report (Signed)
. [facility]  Patient:    [name], [name]                       MRN: [id number] Proc. Date: 03/01/00 Adm. Date:  [id number] Disc. Date: [id number] Attending:  Ricki Rodriguez CC:         Cardiac Catheterization Laboratory                        Cardiac Catheterization  CINE:  [id number]  PROCEDURE:  Left heart catheterization, selective coronary angiography, left ventricular function study, and descending aortography.  CARDIOLOGIST:  Ricki Rodriguez, M.D.  INDICATIONS:  This 60 year old white male had typical angina with a sweating spell and shortness of breath.  COMPLICATIONS:  None.  A Perclose suture was applied without any difficulty.  APPROACH:  Right femoral artery using 6-French diagnostic catheters.  HEMODYNAMIC DATA: Left ventricular pressure:  120 x 14 mmHg. Aortic pressure:  122 x 81 mmHg.  LEFT VENTRICULOGRAM:  The left ventriculogram showed mild generalized hypokinesia with an ejection fraction of 50%-55%.  DESCENDING AORTOGRAPHY:  The descending aortography showed normal renal arteries and mild external and internal iliac vessel disease.  CORONARY ANATOMY: 1. Left main coronary artery:  The left main coronary artery was unremarkable. 2. Left anterior descending coronary artery:  The left anterior descending coronary    artery was a large diagonal vessel and was unremarkable. 3. Left circumflex coronary artery:  The left circumflex coronary artery had    a fair-sized ramus and obtuse marginal branch-I, and was essentially    unremarkable. 4. Right coronary artery:  The right coronary artery was dominant and unremarkable.  IMPRESSION: 1. Normal coronaries. 2. Preserved left ventricular systolic function. 3. Mild peripheral vascular disease.  RECOMMENDATIONS:  This patient will be treated medically. DD:  03/01/00 TD:  03/02/00 Job: [id number]

## 2011-04-20 NOTE — Cardiovascular Report (Signed)
NAME:  [name], [name]                ACCOUNT NO.:  [id number]   MEDICAL RECORD NO.:  [id number]          PATIENT TYPE:  INP   LOCATION:  2018                         FACILITY:  [facility]   PHYSICIAN:  Ricki Rodriguez, M.D.  DATE OF BIRTH:  December 29, 1950   DATE OF PROCEDURE:  08/08/2006  DATE OF DISCHARGE:                              CARDIAC CATHETERIZATION   OPERATION/PROCEDURE:  1. Left heart catheterization.  2. Selective coronary angiography.  3. Left ventricular function study.   INDICATIONS:  This 60 year old white male with cardiac risk factors of  hypertension, smoking, hyperlipidemia, had severe chest pain with sweating  spells.   APPROACH:  Right femoral artery using a 4-French sheath and catheters.   COMPLICATIONS:  None.   DYE:  Less than 40 mL of dye was used.   HEMODYNAMIC DATA:  The left ventricular pressure was 130/20 and the aortic  pressure was 134/84.   LEFT VENTRICULOGRAPHY:  The left ventriculogram showed inferior wall  hypokinesia with ejection fraction of 50%.   CORONARY ANATOMY:  1. The left main coronary artery was unremarkable.  2. Left anterior descending coronary artery:  The left anterior descending      coronary artery had normal proximal area, mid vessel eccentric 30%      lesion near the origin of the diagonal 1 vessel and a diffuse narrowing      of the distal one-third of the vessel. The diagonal 1 and 2 had ostial      20-30% stenosis.  3. Left circumflex coronary artery:  The left circumflex  coronary artery      was unremarkable.  Its ramus branch, obtuse marginal 1 and 2 were also      unremarkable.  4. Right coronary artery:  The right coronary artery was dominant. Had      proximal to mid junction 20-30% eccentric lesion and distal luminal      irregularities.  The posterolateral branch had luminal irregularities      in the proximal section and the posterior descending coronary artery      had diffuse narrowing of the vessel.  The marginal  vessels were      unremarkable.   IMPRESSION:  1. Mild multi-vessel, native vessel coronary artery disease.  2. Mild left ventricular systolic dysfunction.   RECOMMENDATIONS:  This patient will continue his medical therapy and will  undergo therapeutic lifestyle changes.      Ricki Rodriguez, M.D.  Electronically Signed     ASK/MEDQ  D:  08/08/2006  T:  08/08/2006  Job:  [id number]

## 2011-04-20 NOTE — H&P (Signed)
NAME:  [name], [name] NO.:  [id number]   MEDICAL RECORD NO.:  [id number]          PATIENT TYPE:  INP   LOCATION:  1844                         FACILITY:  [facility]   PHYSICIAN:  Ricki Rodriguez, M.D.  DATE OF BIRTH:  1950/12/17   DATE OF ADMISSION:  08/07/2006  DATE OF DISCHARGE:                                HISTORY & PHYSICAL   CHIEF COMPLAINT:  Chest pain.   HISTORY OF PRESENT ILLNESS:  This 60 year old white male had nonexertional  chest pain for 3 hours without radiation of the pain but with nausea and  sweating spell.  The patient had no significant relief with nitroglycerin  use.  His chest pain is increased with cough and motion and some of it is  reproducible by palpation.   PAST MEDICAL HISTORY:  1. Negative for diabetes.  2. Positive for hypertension.  3. Positive for smoking one pack of cigarettes per day for 35 years.  4. Positive for occasional alcohol use.  5. Negative for elevated cholesterol level, myocardial infarction,      obesity, exercise, or family history of premature coronary artery      disease.   PAST SURGICAL HISTORY:  1. Gallbladder surgery in 1996.  2. Left eye surgery in 1994.  3. Appendectomy in 1970s.  4. Subdural hematoma evacuation in 1970s.  5. Subdural hematoma evacuation at age 24 months.  6. Colonic Polyp removal by Dr. Dortha Kern, in 2001.  7. Cardiac catheterization in 2006, showing no significant coronary artery      disease.   CURRENT MEDICATIONS:  1. Ecotrin 325 mg one daily.  2. Benicar 20 mg one daily.  3. Lipitor 10 mg one daily, discontinued due to muscle pain.   ALLERGIES:  CODEINE.   PERSONAL HISTORY:  The patient is married x2, one son died at age 10 years,  has daughter 11 year old and another daughter a 46 year old.  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   FAMILY HISTORY:  Parents not known.  The patient has two half brothers and  one  sister.   REVIEW OF SYSTEMS:  The patient denies recent weight gain, weight loss.  Wears glasses for reading.  No cataract surgery.  Denies hearing loss,  tinnitus, rhinorrhea.  Wears full upper dentures.  No history of cough,  hemoptysis, asthma, pneumonia.  No history of palpitations, dizziness.  A  positive history of occasional chest pain.  No history of diarrhea,  constipation, occasional history of nausea.  Positive history of chronic  obstructive lung disease, leg claudication.  No history of GI bleed, stomach  ulcer, hiatal hernia, blood transfusion, or hematuria.  No history of  hepatitis, kidney stones, strokes, seizures, or psychiatric admissions.  He  has a history of joint pains and he took tetanus diphtheria booster shot in  2000.  No history of flu shot or pneumonia shot intake.   PHYSICAL EXAMINATION:  VITAL SIGNS:  Pulse 104, respirations 20, blood  pressure 123/71, temperature 97.1, pulse oximetry 95% on room air.  The  patient is 5 feet 8 inches tall, weighs 168 pounds.  GENERAL:  Alert, oriented x3 and in mild to moderate distress due to chest  pain.  HEENT:  The patient is normocephalic, atraumatic.  He has hazel eyes.  Conjunctivae pink.  Sclerae nonicteric.  Mucous membranes pink and moist.  NECK:  No JVD.  No carotid bruits.  LUNGS:  Clear bilaterally anteriorly with a few basal crackles bilaterally.  HEART:  Normal S1 S2 with a grade 2/6 systolic murmur.  No S3 gallop.  ABDOMEN:  Soft and mild epigastric tenderness.  Bowel sounds normal.  EXTREMITIES:  No edema.  Mild clubbing.  No cyanosis.  CNS:  Cranial nerves grossly intact.  The patient moves all four  extremities.   LABORATORY DATA:  Revealed normal cardiac enzymes x1.  Sodium 140, potassium  3.5, BUN 16, creatinine 0.9, glucose 119.  Hemoglobin 16.3 and hematocrit  48.  EKG:  Normal sinus rhythm.   ASSESSMENT:  1. Chest pain rule out myocardial infarction.  2. Hypertension.  3. Chronic obstructive  lung disease.  4. Possible musculoskeletal chest pain.   PLAN:  1. Admit the patient to telemetry bed.  2. Check serial cardiac enzymes.  3. Consider cardiac catheterization in a.m.  4. IV nitroglycerin, IV heparin.      Ricki Rodriguez, M.D.  Electronically Signed     ASK/MEDQ  D:  08/07/2006  T:  08/07/2006  Job:  [id number]

## 2011-04-20 NOTE — Discharge Summary (Signed)
[place]  Patient:    [name], [name]                       MRN: [id number] Adm. Date:  [id number] Disc. Date: [id number] Attending:  Orpah Cobb S                           Discharge Summary  DISCHARGE DIAGNOSES: 1. Dilated cardiomyopathy. 2. Atypical angina. 3. 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  PRINCIPAL PROCEDURE:  Adenosine Cardiolite stress test, negative for ischemia, however, with dilated heart and reduced ejection fraction of 48%.  MEDICATIONS: 1. Mobic 7.5 mg daily. 2. Xanax 0.5 mg as needed.  FOLLOW-UP:  Follow-up appointment by Dr. Orpah Cobb in two weeks.  DIET:  Low fat, low salt diet as tolerated.  ACTIVITY:  As tolerated.  SPECIAL INSTRUCTIONS:  Patient to wear left wrist splint for two weeks, and elevate the left hand on extra pillow, and refrain from job for one week with light duty for an additional one week.  HISTORY OF PRESENT ILLNESS:  This 60 year old white male presented to the emergency room with sweating spell, exertional dyspnea, and left hand swelling with numbness. The patient denied any injuries, and works as a Games developer.  He has a history of smoking an alcohol intake.  PHYSICAL EXAMINATION:  VITAL SIGNS:  Stable with a pulse of 96, respirations 22, blood pressure 178/79. Height 5 feet 8 inches, weight 160 pounds.  GENERAL:  The patient was alert and oriented x 3.  HEENT:  Head:  Normocephalic, atraumatic.  Eyes:  Hazel eyes with conjunctivae pink, sclerae nonicteric.  Ears, nose, throat:  Grossly unremarkable, with mucous membranes pink and moist.  NECK:  No JVD, no carotid bruit.  LUNGS:  Clear to auscultation bilaterally.  HEART:  Normal S1, S2, without S3.  ABDOMEN:  Soft and nontender.  EXTREMITIES:  No edema, cyanosis, or clubbing.  There was numbness of the left and fingers and swelling of the left hand.  CNS:  Cranial nerves II-XII grossly  intact.  LABORATORY DATA:  Normal hemoglobin, hematocrit, WBC count, platelet count. Normal electrolytes, BUN, creatinine, and glucose.  CK 131, MB 1.7.  Urinalysis negative.  EKG normal sinus rhythm.  Chest x-ray with cardiomegaly, enlarged heart compared to study two years ago.  Adenosine Cardiolite stress test negative for any reversible ischemia. However, the ejection fraction was reduced to 48%.  HOSPITAL COURSE:  The patient was admitted to telemetry unit.  Myocardial infarction was ruled out.  He underwent adenosine Cardiolite stress test that did not show any ischemia, however, showed reduced ejection fraction.  The patient ad Mobic 7.5 mg 1 daily along with elevation of the left hand and splint placement. The patient had significant relief in swelling, and had no chest pain and no ischemia on the Cardiolite stress test.  Hence, he was discharged home in satisfactory condition, with follow-up by me in one to two weeks, and also with  instructions to refrain from job for one week, and light duty for an additional one week. DD:  01/17/00 TD:  01/18/00 Job: [id number]

## 2011-04-20 NOTE — Op Note (Signed)
[name], [name]                         ACCOUNT NO.:  [id number]   MEDICAL RECORD NO.:  [id number]                   PATIENT TYPE:  OBV   LOCATION:  0380                                 FACILITY:  [facility]   PHYSICIAN:  Adolph Pollack, M.D.            DATE OF BIRTH:  [birth year]   DATE OF PROCEDURE:  07/28/2002  DATE OF DISCHARGE:  07/29/2002                                 OPERATIVE REPORT   PREOPERATIVE DIAGNOSES:  Recurrent ventral incisional hernia.   POSTOPERATIVE DIAGNOSES:  Recurrent ventral incisional hernia.   PROCEDURE:  Laparoscopic repair of recurrent ventral incisional hernia with  Gore-Tex dual mesh.   SURGEON:  Adolph Pollack, M.D.   ASSISTANT:  Anselm Pancoast. Zachery Dakins, M.D.   ANESTHESIA:  General.   INDICATIONS FOR PROCEDURE:  [name] is a 60 year old male who had a  laparoscopic Nissen fundoplication. He than had a coughing spell soon after  this and developed an epigastric ventral incisional hernia. Subsequently, he  has gained a lot of weight, done some lifting and noted some pain and  another bulge and has a recurrent hernia and now presents for repair.   TECHNIQUE:  He was placed supine on the operating table and a general  anesthetic was administered. A Foley catheter was placed in the bladder. His  abdomen was shaved and sterilely prepped and draped. In the left mid abdomen  laterally, a small incision was made through the skin and subcutaneous  tissue and the fascial layers and muscle layers were incised and bluntly  dissected. The peritoneum was incised sharply and entered under direct  vision. A pursestring suture of #0 Vicryl was placed around the anterior  fascial layer and a Hasson trocar was introduced into the peritoneal cavity.  A pneumoperitoneum was created by insufflation of CO2 gas.   The laparoscope was introduced in omentum and it was adherent to the  previous mesh repair in abdominal wall was noted. A 5 mm trocar was then  placed in the right lower quadrant and this omentum was easily able to be  taken down under the hernia. Part of the falciform ligament was also incised  to create an area where the mesh could lay. Next, a 10 mm trocar was placed  in the right mid abdomen and a 5 mm trocar in the right lower quadrant. The  periphery of the hernia was marked with four spinal needles and then 4 cm  were measured out from these and a large oval drawn. A piece of 15 x 19 Gore-  Tex mesh was brought onto the field. It was the dual mesh type. Four  quadrants of it were marked and it was cut to fit the oval and it was on the  anterior abdominal wall. Four stay sutures of #0 Novofil were then placed at  the four quadrants and the mesh was rolled up and placed into the abdominal  cavity. The  mesh was unfurled with the rough side facing up and the smooth  side facing down. Four incisions were made at the four quadrants of the oval  on the anterior abdominal wall and the stay sutures pulled up around the  fascial bridge tightening up and tying down and thus approximating the mesh  to the anterior abdominal wall with more than adequate coverage of the  hernia. Using the spiral tacker, the periphery of the mesh was further  anchored. I then made four incisions in between the four previous anchoring  sutures and placed four more anchoring sutures there using the endoclose  device. The mesh was well anchored at this time. I then removed the left  lateral trocar and closed the fascial defect with one interrupted #0 Novofil  suture and by tightening up and typing down the pursestring suture with #0  Vicryl. The remaining trocars were removed. The pneumoperitoneum was  released.   I had inspected the area around the mesh previously and under the mesh and  noted no bile injury and no bleeding before I removed all the trocars. I  then closed all the skin incisions with 4-0 Monocryl subcuticular sutures.  Steri-Strips and  sterile dressings were applied.   He tolerated the procedure well without any apparent complications and he  was taken to the recovery room in satisfactory condition.                                                Adolph Pollack, M.D.    Kari Baars  D:  07/28/2002  T:  07/29/2002  Job:  [id number]   cc:   Ricki Rodriguez, M.D.  [address]  [place] [address]

## 2011-04-20 NOTE — Op Note (Signed)
[facility]  Patient:    CARNEL, STEGMAN Visit Number: [id number] MRN: [id number]          Service Type: SUR Location: 4W [id number] 01 Attending Physician:  Arlis Porta Dictated by:   Adolph Pollack, M.D. Proc. Date: 10/27/01 Admit Date:  10/27/2001   CC:         Wilhemina Bonito. Eda Keys., M.D. [facility]  Ricki Rodriguez, M.D.   Operative Report  PREOPERATIVE DIAGNOSES:  Hiatal hernia with gastroesophageal reflux disease.  POSTOPERATIVE DIAGNOSES:  Hiatal hernia with gastroesophageal reflux disease.  PROCEDURE:  Laparoscopic hiatal hernia repair and Nissen fundoplication (over a size 50 dilator).  SURGEON:  Adolph Pollack, M.D.  ASSISTANT:  Lorne Skeens. Hoxworth, M.D.  ANESTHESIA:  General.  INDICATION:  This is a 60 year old male, who has been suffering from gastroesophageal reflux disease and has now become medically refractory.  He has had erosive esophagitis documented on upper endoscopy and has a hiatal hernia.  He now presents for elective hiatal hernia repair and Nissen fundoplication.  TECHNIQUE:  He was placed supine on the operating table, and a general anesthetic was administered.  The abdomen was shaved then sterilely prepped and draped.  An inferior epigastric incision was made, incising the skin sharply, carrying this down to the subcutaneous tissues until the midline fascia was identified, and a small incision was made in the midline fascia.  A pursestring suture of 0 Vicryl was placed around the fascial edges.  The peritoneal cavity was entered bluntly and under direct vision.  A Hasson trocar was introduced to the peritoneal cavity and pneumoperitoneum created by insufflation of CO2 gas.  Next the laparoscope was introduced.  Under direct vision, a 5 mm trocar was placed in the right and mid lateral abdomen, through which a liver retractor was inserted and used to hold up the left lobe of the liver.  Following this, two 10  mm incisions were made in the right upper quadrant and one 5 mm incision made in the left upper quadrant.  In the right upper quadrant, 5 mm and 10 mm trocars were placed and in the left upper quadrant, 5 mm trocar was placed.  Next, we visualized the thin gastrohepatic omentum and divided this with the harmonic scalpel up to the level of the crus.  Some of the frenal esophageal attachments superiorly over the esophagus were then divided with the harmonic scalpel down onto the left crus.  I then used blunt dissection to carefully dissect free the esophagus from the right crus.  Using blunt dissection, a retroesophageal window was then created.  A moderate sized hiatal hernia was noted.  The stomach was easily reducible.  Through the retroesophageal window, a small abrasion/avulsion was made in the spleen with minimal bleeding.  I just placed a piece of Surgicel at this site, and this controlled it without difficulty.  Next, I grasped an area approximately halfway down the fundus and then began dividing short gastric vessels to completely mobilize the fundus and the cardia.  Once this was done, I reapproached the hiatus and closed the hiatal hernia with three interrupted size 0 sutures.  This was more than an adequate closure, and the crura were not under tension.  I then grasped the stomach through the retroesophageal window and pulled part of the fundus through, creating a 360 degree wrap which was under no tension.  Following this, under direct vision, a size 50 dilator was placed into the stomach.  I then  performed a 360 degree fundoplication.  The first two sutures of the fundoplication incorporated the left leaf over the wrap of the stomach as part of the wrap, the esophagus, and the right leaf.  The last suture incorporated just the stomach in both bites for the right and left leaf.  The wrap measured approximately 2 cm.  I removed the dilator at this time, and the wrap was  floppy.  I then fixed the right posterior portion of the wrap to the right crus with a single nonabsorbable suture.  I inspected the area and reinspected the splenic area, and no bleeding was noted.  I subsequently removed the liver retractor and removed all the trocars and released the pneumoperitoneum.  The epigastric fascial defect was closed by tightening up and tying down the pursestring suture.  The skin incisions were closed with 4-0 Monocryl subcuticular stitches.  Steri-Strips and sterile dressings were applied.  He tolerated the procedure well without any apparent complications, and he was taken to the recovery room in satisfactory condition. Dictated by:   Adolph Pollack, M.D. Attending Physician:  Arlis Porta DD:  10/27/01 TD:  10/27/01 Job: [id number]

## 2011-04-20 NOTE — Procedures (Signed)
[place]  Patient:    [name], [name] Visit Number: [id number] MRN: [id number]          Service Type: END Location: ENDO Attending Physician:  Estella Husk Dictated by:   Wilhemina Bonito Eda Keys., M.D. [facility] Proc. Date: 08/26/01 Admit Date:  08/26/2001 Discharge Date: 08/26/2001   CC:         Adolph Pollack, M.D.   Procedure Report  PROCEDURE:  Esophageal manometry.  INDICATION:  Preoperative evaluation.  HISTORY:  This is a 60 year old white male with a history of gastroesophageal reflux disease who was recently evaluated in the office.  The patient had symptoms despite compliance with medical therapy.  He was interested in alternatives to medical therapy.  He was interested in proceeding with antireflux surgery.  As such, he is for esophageal manometry.  DESCRIPTION OF PROCEDURE:  The patient presented to the [facility] GI laboratory.  The manometry probe was placed with some difficulty. The findings were as follows:  1. The upper esophageal sphincter demonstrated normal relaxation and    coordination. 2. The esophageal body demonstrated normal wave amplitude and normal wave    propagation. 3. The lower esophageal sphincter resting pressure equalled 21.1 mmHg.  This    was normal.  Relaxation with wet swallowing, however, was not    well-demonstrated.  IMPRESSION:  Relaxation of the lower esophageal sphincter with wet swallowing not well-demonstrated.  Otherwise normal manometry with normal peristalsis.  RECOMMENDATIONS:  Proceed to surgical evaluation for laparoscopic Nissen fundoplication. Dictated by:   Wilhemina Bonito. Eda Keys., M.D. [facility] Attending Physician:  Estella Husk DD:  09/05/01 TD:  09/06/01 Job: [id number]

## 2011-08-28 LAB — URINALYSIS, ROUTINE W REFLEX MICROSCOPIC
Bilirubin Urine: NEGATIVE
Ketones, ur: NEGATIVE
Nitrite: NEGATIVE
Protein, ur: NEGATIVE
Urobilinogen, UA: 0.2
pH: 5.5

## 2011-08-28 LAB — LIPID PANEL
HDL: 66
HDL: 67
LDL Cholesterol: 170 — ABNORMAL HIGH
LDL Cholesterol: 181 — ABNORMAL HIGH
Total CHOL/HDL Ratio: 3.8
Total CHOL/HDL Ratio: 4.1
Triglycerides: 122
Triglycerides: 76
VLDL: 15
VLDL: 24

## 2011-08-28 LAB — COMPREHENSIVE METABOLIC PANEL
AST: 78 — ABNORMAL HIGH
Alkaline Phosphatase: 100
BUN: 10
CO2: 21
Chloride: 103
Creatinine, Ser: 0.7
GFR calc non Af Amer: >60
Potassium: 3.6
Total Bilirubin: 0.4

## 2011-08-28 LAB — DIFFERENTIAL
Basophils Absolute: 0
Basophils Relative: 1
Eosinophils Relative: 6 — ABNORMAL HIGH
Lymphocytes Relative: 36

## 2011-08-28 LAB — CBC
HCT: 44
HCT: 47.8
MCV: 96.4
Platelets: 182
Platelets: 218
RDW: 14.3
RDW: 14.4
WBC: 7.4

## 2011-08-28 LAB — TROPONIN I: Troponin I: 0.01

## 2011-08-28 LAB — PROTIME-INR: Prothrombin Time: 12.2

## 2011-08-30 LAB — COMPREHENSIVE METABOLIC PANEL
ALT: 70 — ABNORMAL HIGH
AST: 64 — ABNORMAL HIGH
Albumin: 4.1
Alkaline Phosphatase: 102
CO2: 24
Chloride: 105
Creatinine, Ser: 0.75
GFR calc Af Amer: >60
GFR calc non Af Amer: >60
Potassium: 3.1 — ABNORMAL LOW
Total Bilirubin: 0.7

## 2011-08-30 LAB — DIFFERENTIAL
Basophils Absolute: 0.1
Basophils Relative: 1
Eosinophils Absolute: 0.2
Eosinophils Relative: 4
Monocytes Absolute: 0.7

## 2011-08-30 LAB — PROTIME-INR: Prothrombin Time: 12.8

## 2011-08-30 LAB — CBC
MCV: 95.7
Platelets: 233
RBC: 4.82
WBC: 6.3

## 2011-08-30 LAB — URINALYSIS, ROUTINE W REFLEX MICROSCOPIC
Glucose, UA: NEGATIVE
Hgb urine dipstick: NEGATIVE
Ketones, ur: NEGATIVE
Protein, ur: NEGATIVE
pH: 5.5

## 2011-08-30 LAB — ETHANOL: Alcohol, Ethyl (B): 186 — ABNORMAL HIGH

## 2011-09-05 LAB — DIFFERENTIAL
Basophils Absolute: 0.6 — ABNORMAL HIGH
Lymphocytes Relative: 20
Lymphs Abs: 2.2
Neutro Abs: 6.8

## 2011-09-05 LAB — URINALYSIS, ROUTINE W REFLEX MICROSCOPIC
Bilirubin Urine: NEGATIVE
Glucose, UA: NEGATIVE
Ketones, ur: NEGATIVE
Leukocytes, UA: NEGATIVE
Nitrite: NEGATIVE
Specific Gravity, Urine: 1.019
pH: 5.5

## 2011-09-05 LAB — CBC
Hemoglobin: 16.1
Platelets: 201
RDW: 14.2
WBC: 11 — ABNORMAL HIGH

## 2011-09-05 LAB — POCT I-STAT, CHEM 8
BUN: 19
Chloride: 106
Creatinine, Ser: 1.2
Sodium: 139

## 2011-09-05 LAB — URINE MICROSCOPIC-ADD ON

## 2011-09-10 LAB — CBC
HCT: 44.5
Hemoglobin: 14.8
MCV: 98.1
RBC: 4.54
WBC: 9.6

## 2011-09-10 LAB — I-STAT 8, (EC8 V) (CONVERTED LAB)
BUN: 14
Bicarbonate: 26.5 — ABNORMAL HIGH
Glucose, Bld: 87
Hemoglobin: 16
Sodium: 141
pH, Ven: 7.418 — ABNORMAL HIGH

## 2011-09-10 LAB — POCT CARDIAC MARKERS
CKMB, poc: 1
Operator id: 151321
Operator id: 270651
Troponin i, poc: <0.05
Troponin i, poc: <0.05
Troponin i, poc: <0.05

## 2011-09-10 LAB — DIFFERENTIAL
Eosinophils Absolute: 0.3
Eosinophils Relative: 3
Lymphs Abs: 2.5
Monocytes Absolute: 0.9
Monocytes Relative: 9

## 2011-09-10 LAB — POCT I-STAT CREATININE: Creatinine, Ser: 0.7

## 2011-09-13 LAB — DIFFERENTIAL
Basophils Relative: 2 — ABNORMAL HIGH
Eosinophils Absolute: 0.2
Monocytes Absolute: 0.6
Monocytes Relative: 9

## 2011-09-13 LAB — COMPREHENSIVE METABOLIC PANEL
ALT: 31
AST: 26
Albumin: 3.8
Alkaline Phosphatase: 94
Potassium: 3.6
Sodium: 140
Total Protein: 6.8

## 2011-09-13 LAB — CBC
Hemoglobin: 15.5
Platelets: 237
RDW: 14.4 — ABNORMAL HIGH

## 2012-02-01 ENCOUNTER — Encounter: Payer: Self-pay | Admitting: Internal Medicine

## 2012-02-27 ENCOUNTER — Ambulatory Visit: Payer: Medicare PPO | Admitting: Internal Medicine

## 2012-07-17 ENCOUNTER — Encounter (HOSPITAL_COMMUNITY): Payer: Self-pay

## 2012-07-17 ENCOUNTER — Inpatient Hospital Stay (HOSPITAL_COMMUNITY)
Admission: EM | Admit: 2012-07-17 | Discharge: 2012-07-18 | DRG: 313 | Disposition: A | Discharge status: Home / Self Care | Payer: Medicare PPO | Attending: Cardiovascular Disease | Admitting: Cardiovascular Disease

## 2012-07-17 ENCOUNTER — Inpatient Hospital Stay (HOSPITAL_COMMUNITY): Payer: Medicare PPO

## 2012-07-17 DIAGNOSIS — Z8673 Personal history of transient ischemic attack (TIA), and cerebral infarction without residual deficits: Secondary | ICD-10-CM

## 2012-07-17 DIAGNOSIS — F10239 Alcohol dependence with withdrawal, unspecified: Secondary | ICD-10-CM | POA: Diagnosis present

## 2012-07-17 DIAGNOSIS — J4489 Other specified chronic obstructive pulmonary disease: Secondary | ICD-10-CM | POA: Diagnosis present

## 2012-07-17 DIAGNOSIS — R000 Tachycardia, unspecified: Secondary | ICD-10-CM | POA: Diagnosis present

## 2012-07-17 DIAGNOSIS — F1020 Alcohol dependence, uncomplicated: Secondary | ICD-10-CM | POA: Diagnosis present

## 2012-07-17 DIAGNOSIS — R079 Chest pain, unspecified: Principal | ICD-10-CM | POA: Diagnosis present

## 2012-07-17 DIAGNOSIS — E785 Hyperlipidemia, unspecified: Secondary | ICD-10-CM | POA: Diagnosis present

## 2012-07-17 DIAGNOSIS — F10939 Alcohol use, unspecified with withdrawal, unspecified: Secondary | ICD-10-CM | POA: Diagnosis present

## 2012-07-17 DIAGNOSIS — I10 Essential (primary) hypertension: Secondary | ICD-10-CM | POA: Diagnosis present

## 2012-07-17 DIAGNOSIS — J449 Chronic obstructive pulmonary disease, unspecified: Secondary | ICD-10-CM | POA: Diagnosis present

## 2012-07-17 DIAGNOSIS — F17200 Nicotine dependence, unspecified, uncomplicated: Secondary | ICD-10-CM | POA: Diagnosis present

## 2012-07-17 DIAGNOSIS — I252 Old myocardial infarction: Secondary | ICD-10-CM

## 2012-07-17 HISTORY — DX: Essential (primary) hypertension: I10

## 2012-07-17 HISTORY — DX: Cerebral infarction, unspecified: I63.9

## 2012-07-17 HISTORY — DX: Acute myocardial infarction, unspecified: I21.9

## 2012-07-17 LAB — CBC WITH DIFFERENTIAL/PLATELET
Eosinophils Relative: 1 % (ref 0–5)
HCT: 50.8 % (ref 39.0–52.0)
Hemoglobin: 18.3 g/dL — ABNORMAL HIGH (ref 13.0–17.0)
Lymphocytes Relative: 27 % (ref 12–46)
MCV: 97.7 fL (ref 78.0–100.0)
Monocytes Absolute: 1 10*3/uL (ref 0.1–1.0)
Monocytes Relative: 15 % — ABNORMAL HIGH (ref 3–12)
Neutro Abs: 3.9 10*3/uL (ref 1.7–7.7)
WBC: 7 10*3/uL (ref 4.0–10.5)

## 2012-07-17 LAB — COMPREHENSIVE METABOLIC PANEL
AST: 106 U/L — ABNORMAL HIGH (ref 0–37)
BUN: 15 mg/dL (ref 6–23)
CO2: 26 mEq/L (ref 19–32)
Calcium: 9.4 mg/dL (ref 8.4–10.5)
Chloride: 99 mEq/L (ref 96–112)
Creatinine, Ser: 0.78 mg/dL (ref 0.50–1.35)
GFR calc Af Amer: >90 mL/min (ref >90)
GFR calc non Af Amer: >90 mL/min (ref >90)
Glucose, Bld: 100 mg/dL — ABNORMAL HIGH (ref 70–99)
Total Bilirubin: 0.5 mg/dL (ref 0.3–1.2)

## 2012-07-17 LAB — CARDIAC PANEL(CRET KIN+CKTOT+MB+TROPI)
Relative Index: INVALID (ref 0.0–2.5)
Relative Index: INVALID (ref 0.0–2.5)
Total CK: 83 U/L (ref 7–232)
Total CK: 65 U/L (ref 7–232)
Troponin I: <0.3 ng/mL (ref <0.30)

## 2012-07-17 LAB — APTT: aPTT: 27 seconds (ref 24–37)

## 2012-07-17 LAB — PRO B NATRIURETIC PEPTIDE: Pro B Natriuretic peptide (BNP): 5.4 pg/mL (ref 0–125)

## 2012-07-17 LAB — MAGNESIUM: Magnesium: 1.8 mg/dL (ref 1.5–2.5)

## 2012-07-17 MED ORDER — NITROGLYCERIN IN D5W 200-5 MCG/ML-% IV SOLN
3.0000 ug/min | INTRAVENOUS | Status: DC
  Administered 2012-07-17: 15 ug/min via INTRAVENOUS
  Filled 2012-07-17: qty 250

## 2012-07-17 MED ORDER — ATORVASTATIN CALCIUM 40 MG PO TABS
40.0000 mg | ORAL_TABLET | Freq: Every morning | ORAL | Status: DC
  Administered 2012-07-18: 40 mg via ORAL
  Filled 2012-07-17: qty 1

## 2012-07-17 MED ORDER — CLOPIDOGREL BISULFATE 75 MG PO TABS
75.0000 mg | ORAL_TABLET | Freq: Once | ORAL | Status: AC
  Administered 2012-07-17: 75 mg via ORAL
  Filled 2012-07-17 (×2): qty 1

## 2012-07-17 MED ORDER — HYDROCHLOROTHIAZIDE 12.5 MG PO CAPS
12.5000 mg | ORAL_CAPSULE | Freq: Every day | ORAL | Status: DC
  Administered 2012-07-18: 12.5 mg via ORAL
  Filled 2012-07-17: qty 1

## 2012-07-17 MED ORDER — ASPIRIN 300 MG RE SUPP: 300.0000 mg | RECTAL | Status: AC

## 2012-07-17 MED ORDER — ALPRAZOLAM 0.25 MG PO TABS
0.2500 mg | ORAL_TABLET | Freq: Two times a day (BID) | ORAL | Status: DC | PRN
  Administered 2012-07-18: 0.25 mg via ORAL
  Filled 2012-07-17: qty 1

## 2012-07-17 MED ORDER — ASPIRIN EC 81 MG PO TBEC: 81.0000 mg | DELAYED_RELEASE_TABLET | Freq: Every day | ORAL | Status: DC

## 2012-07-17 MED ORDER — NITROGLYCERIN 0.4 MG SL SUBL: 0.4000 mg | SUBLINGUAL_TABLET | SUBLINGUAL | Status: DC | PRN

## 2012-07-17 MED ORDER — SODIUM CHLORIDE 0.9 % IV SOLN: 250.0000 mL | INTRAVENOUS | Status: DC | PRN

## 2012-07-17 MED ORDER — LISINOPRIL 20 MG PO TABS
20.0000 mg | ORAL_TABLET | Freq: Every day | ORAL | Status: DC
  Administered 2012-07-18: 20 mg via ORAL
  Filled 2012-07-17: qty 1

## 2012-07-17 MED ORDER — ONDANSETRON HCL 4 MG/2ML IJ SOLN: 4.0000 mg | Freq: Four times a day (QID) | INTRAMUSCULAR | Status: DC | PRN

## 2012-07-17 MED ORDER — SODIUM CHLORIDE 0.9 % IJ SOLN
3.0000 mL | Freq: Two times a day (BID) | INTRAMUSCULAR | Status: DC
  Administered 2012-07-17: 3 mL via INTRAVENOUS

## 2012-07-17 MED ORDER — CILOSTAZOL 100 MG PO TABS
100.0000 mg | ORAL_TABLET | Freq: Two times a day (BID) | ORAL | Status: DC
  Administered 2012-07-17 – 2012-07-18 (×2): 100 mg via ORAL
  Filled 2012-07-17 (×3): qty 1

## 2012-07-17 MED ORDER — SODIUM CHLORIDE 0.9 % IJ SOLN: 3.0000 mL | INTRAMUSCULAR | Status: DC | PRN

## 2012-07-17 MED ORDER — METOPROLOL TARTRATE 25 MG PO TABS: 25.0000 mg | ORAL_TABLET | Freq: Two times a day (BID) | ORAL | Status: DC

## 2012-07-17 MED ORDER — ACETAMINOPHEN 325 MG PO TABS: 650.0000 mg | ORAL_TABLET | ORAL | Status: DC | PRN

## 2012-07-17 MED ORDER — ISOSORBIDE MONONITRATE ER 60 MG PO TB24
60.0000 mg | ORAL_TABLET | Freq: Every day | ORAL | Status: DC
  Administered 2012-07-18: 60 mg via ORAL
  Filled 2012-07-17: qty 1

## 2012-07-17 MED ORDER — ASPIRIN 81 MG PO CHEW
324.0000 mg | CHEWABLE_TABLET | ORAL | Status: AC
  Administered 2012-07-17: 324 mg via ORAL
  Filled 2012-07-17: qty 4

## 2012-07-17 MED ORDER — POTASSIUM CHLORIDE ER 10 MEQ PO TBCR
10.0000 meq | EXTENDED_RELEASE_TABLET | Freq: Every evening | ORAL | Status: DC
  Administered 2012-07-17: 10 meq via ORAL
  Filled 2012-07-17 (×2): qty 1

## 2012-07-17 MED ORDER — HEPARIN (PORCINE) IN NACL 100-0.45 UNIT/ML-% IJ SOLN
1250.0000 [IU]/h | INTRAMUSCULAR | Status: DC
  Administered 2012-07-17: 1000 [IU]/h via INTRAVENOUS
  Filled 2012-07-17 (×2): qty 250

## 2012-07-17 MED ORDER — METOPROLOL TARTRATE 25 MG PO TABS
100.0000 mg | ORAL_TABLET | Freq: Two times a day (BID) | ORAL | Status: DC
  Administered 2012-07-17 – 2012-07-18 (×2): 100 mg via ORAL
  Filled 2012-07-17 (×3): qty 4

## 2012-07-17 MED ORDER — ASPIRIN 81 MG PO CHEW
81.0000 mg | CHEWABLE_TABLET | Freq: Every day | ORAL | Status: DC
  Administered 2012-07-18: 81 mg via ORAL
  Filled 2012-07-17: qty 1

## 2012-07-17 MED ORDER — HEPARIN BOLUS VIA INFUSION
4000.0000 [IU] | Freq: Once | INTRAVENOUS | Status: AC
Start: 2012-07-17 — End: 2012-07-17
  Administered 2012-07-17: 4000 [IU] via INTRAVENOUS
  Filled 2012-07-17: qty 4000

## 2012-07-17 NOTE — Progress Notes (Signed)
ANTICOAGULATION CONSULT NOTE - Initial Consult  Pharmacy Consult for Heparin Indication: chest pain/ACS  Allergies  Allergen Reactions  . Latex Rash    Patient Measurements: Height: 5\' 8"  (172.7 cm) Weight: 180 lb (81.647 kg) IBW/kg (Calculated) : 68.4  Heparin Dosing Weight: 82 kg  Vital Signs: Temp: 97.3 F (36.3 C) (08/15 1446) Temp src: Oral (08/15 1446) BP: 165/109 mmHg (08/15 1446) Pulse Rate: 124  (08/15 1446)  Labs: No results found for this basename: HGB:2,HCT:3,PLT:3,APTT:3,LABPROT:3,INR:3,HEPARINUNFRC:3,CREATININE:3,CKTOTAL:3,CKMB:3,TROPONINI:3 in the last 72 hours  Estimated Creatinine Clearance: 93.8 ml/min (by C-G formula based on Cr of 0.75).   Medical History: Past Medical History  Diagnosis Date  . Stroke   . Hypertension   . Myocardial infarction     Medications:  See med rec   Assessment: Patient is a 61 y.o. M admitted to the ED with c/o CP. Spoke to patient, he is very hesitant about starting heparin therapy. Per Dr. Algie Coffer, patient is now OK for [place] to start heparin therapy for him.  Goal of Therapy:  Heparin level 0.3-0.7 units/ml Monitor platelets by anticoagulation protocol: Yes   Plan:  1) heparin 4000 units x1 bolus, then heparin gtt at 1000 units/hr 2) check 6 hour heparin level   Shayan Bramhall P 07/17/2012,3:53 PM

## 2012-07-17 NOTE — ED Provider Notes (Signed)
Patient moved to CDU directly from triage awaiting admission for acute coronary syndrome.  Patient presented to Dr. Roseanne Kaufman office with palpitations and diaphoresis.  Dr. Algie Coffer accompanied patient to ED and entered admission orders.  Patient denies pain or discomfort at present.  Strong odor of Etoh, patient admits to consuming "4 or 5 beers" today.  Prior history of MI and poorly controlled hypertension.  Patient reports having epigastric chest pressure earlier, now resolved.  Patient alert, oriented, skin warm, dry, red.  Lungs CTA bilaterally.  S1/S2, RRR, no murmur.  Abdomen soft, non-tender.  Strong distal pulses palpated all extremities.     Date: 07/17/2012  Rate: 104  Rhythm: sinus tachycardia  QRS Axis: normal  Intervals: normal  ST/T Wave abnormalities: normal  Conduction Disutrbances:none  Narrative Interpretation:  Sinus tachycardia   Old EKG Reviewed: changes noted  comparision to ECG of 04/12/11--tachycardia present today, first degree block no longer present    Jimmye Norman, NP 07/17/12 1935

## 2012-07-17 NOTE — ED Notes (Signed)
Pt presents with onset of midscapular pain that radiates into chest while at MD office getting BP checked.  +diaphoresis and shortness of breath.

## 2012-07-17 NOTE — H&P (Signed)
[name] is an 61 y.o. male.   Chief Complaint: chest pain HPI: 61 years old male with uncontrolled hypertension presented to office for high blood pressure. While waiting in waiting room of the office he had chest pain and sweating spell. He was brought to ER and found to be tachycardic with elevated blood pressure. He admits to excess salt intake.  Past Medical History  Diagnosis Date  . Stroke   . Hypertension   . Myocardial infarction       Past Surgical History  Procedure Date  . Brain surgery   . Cholecystectomy   . Appendectomy     No family history on file. Social History:  reports that he has been smoking.  He does not have any smokeless tobacco history on file. He reports that he drinks alcohol. He reports that he does not use illicit drugs.  Allergies:  Allergies  Allergen Reactions  . Latex Rash     (Not in a hospital admission)  Results for orders placed during the hospital encounter of 07/17/12 (from the past 48 hour(s))  CARDIAC PANEL(CRET KIN+CKTOT+MB+TROPI)     Status: Normal   Collection Time   07/17/12  3:55 PM      Component Value Range Comment   Total CK 83  7 - 232 U/L    CK, MB 2.4  0.3 - 4.0 ng/mL    Troponin I <0.30  <0.30 ng/mL    Relative Index RELATIVE INDEX IS INVALID  0.0 - 2.5   PROTIME-INR     Status: Normal   Collection Time   07/17/12  3:55 PM      Component Value Range Comment   Prothrombin Time 12.1  11.6 - 15.2 seconds    INR 0.88  0.00 - 1.49   APTT     Status: Normal   Collection Time   07/17/12  3:55 PM      Component Value Range Comment   aPTT 27  24 - 37 seconds   CBC WITH DIFFERENTIAL     Status: Abnormal   Collection Time   07/17/12  3:55 PM      Component Value Range Comment   WBC 7.0  4.0 - 10.5 K/uL    RBC 5.20  4.22 - 5.81 MIL/uL    Hemoglobin 18.3 (*) 13.0 - 17.0 g/dL    HCT 62.1  30.8 - 65.7 %    MCV 97.7  78.0 - 100.0 fL    MCH 35.2 (*) 26.0 - 34.0 pg    MCHC 36.0  30.0 - 36.0 g/dL    RDW 84.6  96.2 -  95.2 %    Platelets 166  150 - 400 K/uL    Neutrophils Relative 57  43 - 77 %    Neutro Abs 3.9  1.7 - 7.7 K/uL    Lymphocytes Relative 27  12 - 46 %    Lymphs Abs 1.9  0.7 - 4.0 K/uL    Monocytes Relative 15 (*) 3 - 12 %    Monocytes Absolute 1.0  0.1 - 1.0 K/uL    Eosinophils Relative 1  0 - 5 %    Eosinophils Absolute 0.1  0.0 - 0.7 K/uL    Basophils Relative 1  0 - 1 %    Basophils Absolute 0.1  0.0 - 0.1 K/uL   COMPREHENSIVE METABOLIC PANEL     Status: Abnormal   Collection Time   07/17/12  3:55 PM      Component  Value Range Comment   Sodium 143  135 - 145 mEq/L    Potassium 3.6  3.5 - 5.1 mEq/L    Chloride 99  96 - 112 mEq/L    CO2 26  19 - 32 mEq/L    Glucose, Bld 100 (*) 70 - 99 mg/dL    BUN 15  6 - 23 mg/dL    Creatinine, Ser 4.54  0.50 - 1.35 mg/dL    Calcium 9.4  8.4 - 09.8 mg/dL    Total Protein 8.0  6.0 - 8.3 g/dL    Albumin 3.9  3.5 - 5.2 g/dL    AST 119 (*) 0 - 37 U/L    ALT 88 (*) 0 - 53 U/L    Alkaline Phosphatase 93  39 - 117 U/L    Total Bilirubin 0.5  0.3 - 1.2 mg/dL    GFR calc non Af Amer >90  >90 mL/min    GFR calc Af Amer >90  >90 mL/min   PRO B NATRIURETIC PEPTIDE     Status: Normal   Collection Time   07/17/12  3:55 PM      Component Value Range Comment   Pro B Natriuretic peptide (BNP) 5.4  0 - 125 pg/mL   MAGNESIUM     Status: Normal   Collection Time   07/17/12  3:55 PM      Component Value Range Comment   Magnesium 1.8  1.5 - 2.5 mg/dL    No results found.  @ROS @ No weight gain, wears reading glasses, Full upper and lower dentures, + chest pain, + palpitation, + leg edema, + shortness of breath, no hemoptysis, No GI bleed, no hepatitis, + kidney stone, + CVA, no seizures, no psych admission, + joint pains, + back pain, + leg pain.  Physical Exam:  Blood pressure 153/93, pulse 104, temperature 98.2 F (36.8 C), temperature source Oral, resp. rate 19, height 5\' 8"  (1.727 m), weight 81.647 kg (180 lb), SpO2 92.00%. HEENT: The patient is  normocephalic, atraumatic with hazel eyes. Conjunctivae pink. Sclerae nonicteric. He wears dentures and reading glasses. Tongue pink and midline.  NECK: No JVD, no carotid bruit. Full range of motion.  LUNGS: Clear bilaterally.  HEART: Normal S1-S2, tachycardic. II/VI systolic murmur. ABDOMEN: Soft and nontender but distended.  EXTREMITIES: No cyanosis, positive clubbing. Trace edema.  CNS: Cranial nerves grossly intact. The patient is right-handed.  SKIN: Warm and dry.   Assessment/Plan Chest pain Hypertension COPD Hyperlipidemia Anxiety Alcohol use disorder Tobacco use disorder Peripheral vascular disease  Admit R/O MI Home medications.  Gervase Colberg S 07/17/2012, 6:05 PM

## 2012-07-17 NOTE — ED Notes (Signed)
Pt presents with palpitations, chest pain and shortness of breath while at MD office.  Pt was there to have BP evaluated.  Pt admits to etoh use prior to arrival.  Dr. Algie Coffer with pt and is placing admission orders while in triage.

## 2012-07-18 ENCOUNTER — Inpatient Hospital Stay (HOSPITAL_COMMUNITY): Payer: Medicare PPO

## 2012-07-18 LAB — COMPREHENSIVE METABOLIC PANEL
ALT: 65 U/L — ABNORMAL HIGH (ref 0–53)
AST: 71 U/L — ABNORMAL HIGH (ref 0–37)
Albumin: 3.2 g/dL — ABNORMAL LOW (ref 3.5–5.2)
CO2: 29 mEq/L (ref 19–32)
Chloride: 103 mEq/L (ref 96–112)
Creatinine, Ser: 0.72 mg/dL (ref 0.50–1.35)
GFR calc non Af Amer: >90 mL/min (ref >90)
Potassium: 3.7 mEq/L (ref 3.5–5.1)
Sodium: 143 mEq/L (ref 135–145)
Total Bilirubin: 1 mg/dL (ref 0.3–1.2)

## 2012-07-18 LAB — HEPARIN LEVEL (UNFRACTIONATED): Heparin Unfractionated: 0.15 IU/mL — ABNORMAL LOW (ref 0.30–0.70)

## 2012-07-18 LAB — CBC
Platelets: 138 10*3/uL — ABNORMAL LOW (ref 150–400)
RBC: 4.41 MIL/uL (ref 4.22–5.81)
RDW: 14.3 % (ref 11.5–15.5)
WBC: 6.9 10*3/uL (ref 4.0–10.5)

## 2012-07-18 LAB — PROTIME-INR: Prothrombin Time: 13.1 seconds (ref 11.6–15.2)

## 2012-07-18 LAB — LIPID PANEL
LDL Cholesterol: 112 mg/dL — ABNORMAL HIGH (ref 0–99)
Triglycerides: 81 mg/dL (ref <150)
VLDL: 16 mg/dL (ref 0–40)

## 2012-07-18 LAB — CARDIAC PANEL(CRET KIN+CKTOT+MB+TROPI)
Relative Index: INVALID (ref 0.0–2.5)
Total CK: 67 U/L (ref 7–232)
Troponin I: <0.3 ng/mL (ref <0.30)

## 2012-07-18 MED ORDER — REGADENOSON 0.4 MG/5ML IV SOLN
0.4000 mg | Freq: Once | INTRAVENOUS | Status: AC
  Administered 2012-07-18: 0.4 mg via INTRAVENOUS
  Filled 2012-07-18: qty 5

## 2012-07-18 MED ORDER — LORAZEPAM 1 MG PO TABS
1.0000 mg | ORAL_TABLET | Freq: Once | ORAL | Status: AC
  Administered 2012-07-18: 1 mg via ORAL
  Filled 2012-07-18: qty 1

## 2012-07-18 MED ORDER — TECHNETIUM TC 99M TETROFOSMIN IV KIT
10.0000 | PACK | Freq: Once | INTRAVENOUS | Status: AC | PRN
  Administered 2012-07-18: 10 via INTRAVENOUS

## 2012-07-18 MED ORDER — TECHNETIUM TC 99M TETROFOSMIN IV KIT
30.0000 | PACK | Freq: Once | INTRAVENOUS | Status: AC | PRN
  Administered 2012-07-18: 30 via INTRAVENOUS

## 2012-07-18 MED ORDER — LORAZEPAM 1 MG PO TABS: 1.0000 mg | ORAL_TABLET | Freq: Three times a day (TID) | ORAL | Status: AC | PRN

## 2012-07-18 NOTE — Progress Notes (Signed)
UR Completed.  Meloni Hinz Jane [phone] 07/18/2012  

## 2012-07-18 NOTE — Discharge Summary (Signed)
Physician Discharge Summary  Patient ID: [name] MRN: [id number] DOB/AGE: [birth year] 61 y.o.  Admit date: 07/17/2012 Discharge date: 07/18/2012  Admission Diagnoses: Chest pain  Hypertension  COPD  Hyperlipidemia  Anxiety  Alcohol use disorder  Tobacco use disorder  Peripheral vascular disease  Discharge Diagnoses:  Active Problems:  *Chest pain* Principle diagnosis  Hypertension  COPD  Hyperlipidemia  Anxiety  Alcohol use disorder Tobacco use disorder  Peripheral vascular disease   Discharged Condition: fair  Hospital Course: 61 years old male with hypertension, COPD, Anxiety and alcohol use disorder had chest pain with sweating spell. He underwent nuclear stress test that failed to show reversible ischemia. He was given Lorazepam for alcohol withdrawal. He was advised to decrease alcohol consumption by 50 % every week till able to quit in 2-3 weeks or sooner or stay in hospital 48 hours and get Lorazepam scheduled and as needed.   He refused to stay and wife agreed to take him home and keep an eye on his alcohol use. He was also advised to decrease salt intake to control blood pressure and decrease smoking to quit asap.  Consults: None  Significant Diagnostic Studies: labs: essentially unremarkable and nuclear medicine: negative for reversible ischemia with EF of 54 % with anterior scar.  Treatments: analgesia: Morphine and cardiac meds: lisinopril (Prinivil), metoprolol and Imdur  Discharge Exam: Blood pressure 136/80, pulse 72, temperature 98.3 F (36.8 C), temperature source Oral, resp. rate 16, height 5\' 8"  (1.727 m), weight 77.2 kg (170 lb 3.1 oz), SpO2 94.00%. HEENT: The patient is normocephalic, atraumatic with hazel eyes. Conjunctivae pink. Sclerae nonicteric. He wears dentures and reading glasses. Tongue pink and midline.  NECK: No JVD, no carotid bruit. Full range of motion.  LUNGS: Clear bilaterally.  HEART: Normal S1-S2, tachycardic. II/VI systolic  murmur.  ABDOMEN: Soft and nontender but distended.  EXTREMITIES: No cyanosis, positive clubbing. Trace edema.  CNS: Cranial nerves grossly intact. The patient is right-handed. Fine tremors worsening with activity. SKIN: Warm and dry.   Disposition: 01-Home or Self Care   Medication List  As of 07/18/2012  5:50 PM   STOP taking these medications         atorvastatin 10 MG tablet         TAKE these medications         aspirin 81 MG chewable tablet   Chew 81 mg by mouth daily.      atorvastatin 40 MG tablet   Commonly known as: LIPITOR   Take 40 mg by mouth every morning.      cilostazol 100 MG tablet   Commonly known as: PLETAL   Take 100 mg by mouth 2 (two) times daily.      hydrochlorothiazide 12.5 MG capsule   Commonly known as: MICROZIDE   Take 12.5 mg by mouth daily.      isosorbide mononitrate 60 MG 24 hr tablet   Commonly known as: IMDUR   Take 60 mg by mouth daily.      lisinopril 20 MG tablet   Commonly known as: PRINIVIL,ZESTRIL   Take 20 mg by mouth daily.      LORazepam 1 MG tablet   Commonly known as: ATIVAN   Take 1 tablet (1 mg total) by mouth every 8 (eight) hours as needed for anxiety.      metoprolol 100 MG tablet   Commonly known as: LOPRESSOR   Take 100 mg by mouth 2 (two) times daily.      nitroGLYCERIN  0.4 MG SL tablet   Commonly known as: NITROSTAT   Place 0.4 mg under the tongue every 5 (five) minutes as needed. For chest pain      potassium chloride 10 MEQ tablet   Commonly known as: K-DUR   Take 10 mEq by mouth every evening.           Follow-up Information    Follow up with [facility] S, MD. Schedule an appointment as soon as possible for a visit in 1 week.   Contact information:   [address] [place] [address] [phone]          Signed: Ricki Rodriguez 07/18/2012, 5:50 PM

## 2012-07-18 NOTE — Progress Notes (Signed)
ANTICOAGULATION CONSULT NOTE - Initial Consult  Pharmacy Consult for Heparin Indication: chest pain/ACS  Allergies  Allergen Reactions  . Latex Rash    Patient Measurements: Height: 5\' 8"  (172.7 cm) Weight: 170 lb 3.1 oz (77.2 kg) IBW/kg (Calculated) : 68.4  Heparin Dosing Weight: 82 kg  Vital Signs: Temp: 98.2 F (36.8 C) (08/16 0345) Temp src: Oral (08/16 0345) BP: 166/97 mmHg (08/16 0400) Pulse Rate: 79  (08/16 0100)  Labs:  Basename 07/18/12 0317 07/17/12 2150 07/17/12 1555  HGB 15.2 -- 18.3*  HCT 43.0 -- 50.8  PLT 138* -- 166  APTT -- -- 27  LABPROT 13.1 -- 12.1  INR 0.97 -- 0.88  HEPARINUNFRC 0.15* -- --  CREATININE 0.72 -- 0.78  CKTOTAL 67 65 83  CKMB 2.3 2.3 2.4  TROPONINI <0.30 <0.30 <0.30    Estimated Creatinine Clearance: 93.8 ml/min (by C-G formula based on Cr of 0.72).   Medical History: Past Medical History  Diagnosis Date  . Stroke   . Hypertension   . Myocardial infarction     Medications:  See med rec   Assessment: subtherapeutic heparin level 0.15. No bleeding noted. No infusion issues.  Goal of Therapy:  Heparin level 0.3-0.7 units/ml Monitor platelets by anticoagulation protocol: Yes   Plan:  Increase to 1250 and recheck in 6-8 hours.    Janice Coffin 07/18/2012,4:12 AM

## 2012-07-23 NOTE — ED Provider Notes (Signed)
Medical screening examination/treatment/procedure(s) were conducted as a shared visit with non-physician practitioner(s) and myself.  I personally evaluated the patient during the encounter  Patient to be admitted by DR Algie Coffer, not formally seen by any ED provider, but screened by [place] since in our department. Will be admitted for chest pain.   Shelda Jakes, MD 07/23/12 2125

## 2012-09-15 ENCOUNTER — Encounter: Payer: Self-pay | Admitting: Internal Medicine

## 2012-09-17 ENCOUNTER — Encounter: Payer: Self-pay | Admitting: Internal Medicine

## 2012-11-11 ENCOUNTER — Telehealth: Payer: Self-pay | Admitting: *Deleted

## 2012-11-11 NOTE — Telephone Encounter (Signed)
Pt scheduled to see Dr. Marina Goodell tomorrow at 10:15am. Pt aware of appt date and time.

## 2012-11-11 NOTE — Telephone Encounter (Signed)
Based on the electronic chart, I cannot see that I have ever seen this patient. Have I? Y. Was he being set up for colonoscopy? Please investigate. It is likely he may need an office visit, but we will see. Thanks

## 2012-11-11 NOTE — Telephone Encounter (Signed)
Pt is a recall colon. Last colon with Dr. Marina Goodell 09/23/2007 with a 5 year recall. Pt received recall letter to schedule colon.

## 2012-11-11 NOTE — Telephone Encounter (Signed)
I would recommend a routine office visit with me to assess his clinical status and determine the appropriate as a surveillance colonoscopy as well as its location options

## 2012-11-11 NOTE — Telephone Encounter (Signed)
Dr. Marina Goodell please see the note below from previsit. Do you want pt seen for an OV? Please advise.

## 2012-11-11 NOTE — Telephone Encounter (Signed)
Mr. [name] has been recalled for colon and has a previsit schedule for 11/14/12.  On reviewing his chart it appears he has severe COPD and is also on Pletal from a recent event in August 2013.  He will be a candidate for hospital colon.  Please advise.

## 2012-11-12 ENCOUNTER — Encounter: Payer: Self-pay | Admitting: Internal Medicine

## 2012-11-12 ENCOUNTER — Ambulatory Visit (INDEPENDENT_AMBULATORY_CARE_PROVIDER_SITE_OTHER): Payer: Medicare PPO | Admitting: Internal Medicine

## 2012-11-12 VITALS — BP 142/88 | HR 84 | Ht 68.0 in | Wt 182.0 lb

## 2012-11-12 DIAGNOSIS — I2510 Atherosclerotic heart disease of native coronary artery without angina pectoris: Secondary | ICD-10-CM

## 2012-11-12 DIAGNOSIS — Z86010 Personal history of colonic polyps: Secondary | ICD-10-CM

## 2012-11-12 DIAGNOSIS — J449 Chronic obstructive pulmonary disease, unspecified: Secondary | ICD-10-CM

## 2012-11-12 MED ORDER — MOVIPREP 100 G PO SOLR: 1.0000 | Freq: Once | ORAL | Status: DC

## 2012-11-12 NOTE — Patient Instructions (Addendum)
You have been scheduled for a colonoscopy with propofol. Please follow written instructions given to you at your visit today.  Please pick up your prep kit at the pharmacy within the next 1-3 days. If you use inhalers (even only as needed) or a CPAP machine, please bring them with you on the day of your procedure.  

## 2012-11-12 NOTE — Progress Notes (Signed)
HISTORY OF PRESENT ILLNESS:  [name] is a 61 y.o. male with coronary artery disease, cerebrovascular disease, COPD,hypertension, tobacco abuse, alcohol abuse, and adenomatous colon polyps. His also had prior surgeries as outlined. He is referred to the office today regarding evaluation for surveillance colonoscopy. Patient has a history of adenomatous colon polyps and has undergone prior colonoscopy in 1998, 2002, 2005, and most recently in 2008. At the time of his most recent colonoscopy. He was found to have several polyps, one adenomatous. Followup in 5 years recommended. The patient's GI review of systems is entirely negative. He was hospitalized in August for chest pain and hypertension. He does have COPD, but has not been hospitalized for this nor does he use oxygen. Currently, he reports that his chronic medical problems are stable. He is Pletal. He remains active.  REVIEW OF SYSTEMS:  All non-GI ROS negative except for heart murmur, arthritis  Past Medical History  Diagnosis Date  . Stroke   . Hypertension   . Myocardial infarction   . Colon polyps   . Diverticulosis     Past Surgical History  Procedure Date  . Brain surgery   . Cholecystectomy   . Appendectomy   . Hernia repair   . Orbitol sugery     Social History [name]  reports that he has been smoking.  He has never used smokeless tobacco. He reports that he drinks alcohol. He reports that he does not use illicit drugs.  family history is not on file. Family history is unknown by patient.  Allergies  Allergen Reactions  . Latex Rash       PHYSICAL EXAMINATION:  Vital signs: BP 142/88  Pulse 84  Ht 5\' 8"  (1.727 m)  Wt 182 lb (82.555 kg)  BMI 27.67 kg/m2 General: Well-developed, well-nourished, no acute distress HEENT: Sclerae are anicteric, conjunctiva pink. Oral mucosa intact Lungs: Clear Heart: Regular Abdomen: soft, nontender, nondistended, no obvious ascites, no peritoneal signs, normal  bowel sounds. No organomegaly. Extremities: No edema Psychiatric: alert and oriented x3. Cooperative     ASSESSMENT:  #1. Personal history of adenomatous colon polyps. Due for surveillance. Appropriate candidate without contraindication.   PLAN:  #1. Surveillance colonoscopy.The nature of the procedure, as well as the risks, benefits, and alternatives were carefully and thoroughly reviewed with the patient. Ample time for discussion and questions allowed. The patient understood, was satisfied, and agreed to proceed.  #2. Movi prep prescribed. The patient instructed on his use. #3. CRNA supervised propofol administration for sedation recommended.

## 2012-11-28 ENCOUNTER — Ambulatory Visit (AMBULATORY_SURGERY_CENTER): Payer: Medicare PPO | Admitting: Internal Medicine

## 2012-11-28 ENCOUNTER — Encounter: Payer: Self-pay | Admitting: Internal Medicine

## 2012-11-28 VITALS — BP 121/89 | HR 81 | Temp 98.1°F | Resp 19 | Ht 68.0 in | Wt 182.0 lb

## 2012-11-28 DIAGNOSIS — Z86010 Personal history of colonic polyps: Secondary | ICD-10-CM

## 2012-11-28 DIAGNOSIS — Z1211 Encounter for screening for malignant neoplasm of colon: Secondary | ICD-10-CM

## 2012-11-28 DIAGNOSIS — D126 Benign neoplasm of colon, unspecified: Secondary | ICD-10-CM

## 2012-11-28 MED ORDER — SODIUM CHLORIDE 0.9 % IV SOLN: 500.0000 mL | INTRAVENOUS | Status: DC

## 2012-11-28 NOTE — Op Note (Signed)
[facility] [address] [place], [address]   COLONOSCOPY PROCEDURE REPORT  PATIENT: Travone, Georg  MR#: [id number] BIRTHDATE: 1951-05-12 , 61  yrs. old GENDER: Male ENDOSCOPIST: Roxy Cedar, MD REFERRED XB:[id number] Program Recall PROCEDURE DATE:  11/28/2012 PROCEDURE:   Colonoscopy with snare polypectomy    x 1 ASA CLASS:   Class III INDICATIONS:Patient's personal history of adenomatous colon polyps. Prior exams [phone] MEDICATIONS: MAC sedation, administered by CRNA and propofol (Diprivan) 250mg  IV  DESCRIPTION OF PROCEDURE:   After the risks benefits and alternatives of the procedure were thoroughly explained, informed consent was obtained.  A digital rectal exam revealed no abnormalities of the rectum.   The LB CF-[id number]  endoscope was introduced through the anus and advanced to the cecum, which was identified by both the appendix and ileocecal valve. No adverse events experienced.   The quality of the prep was good, using MoviPrep  The instrument was then slowly withdrawn as the colon was fully examined.      COLON FINDINGS: A diminutive polyp was found at the hepatic flexure. A polypectomy was performed with a cold snare.  The resection was complete and the polyp tissue was completely retrieved.   Moderate diverticulosis was noted The finding was in the left colon.   The colon mucosa was otherwise normal.  Retroflexed views revealed internal hemorrhoids. The time to cecum=1 minutes 59 seconds. Withdrawal time=11 minutes 07 seconds.  The scope was withdrawn and the procedure completed. COMPLICATIONS: There were no complications.  ENDOSCOPIC IMPRESSION: 1.   Diminutive polyp was found at the hepatic flexure; polypectomy was performed with a cold snare 2.   Moderate diverticulosis was noted in the left colon 3.   The colon mucosa was otherwise normal  RECOMMENDATIONS: 1. Follow up colonoscopy in 5 years   eSigned:   Roxy Cedar, MD 11/28/2012 9:11 AM   cc: Orpah Cobb, MD and The Patient   PATIENT NAME:  Thurl, Boen MR#: [id number]

## 2012-11-28 NOTE — Progress Notes (Signed)
Pt and wife both unsure about when he took his Pletal last

## 2012-11-28 NOTE — Progress Notes (Signed)
Patient did not experience any of the following events: a burn prior to discharge; a fall within the facility; wrong site/side/patient/procedure/implant event; or a hospital transfer or hospital admission upon discharge from the facility. (G8907) Patient did not have preoperative order for IV antibiotic SSI prophylaxis. (G8918)  

## 2012-11-28 NOTE — Patient Instructions (Addendum)
YOU HAD AN ENDOSCOPIC PROCEDURE TODAY AT THE [facility]: Refer to the procedure report that was given to you for any specific questions about what was found during the examination.  If the procedure report does not answer your questions, please call your gastroenterologist to clarify.  If you requested that your care partner not be given the details of your procedure findings, then the procedure report has been included in a sealed envelope for you to review at your convenience later.  YOU SHOULD EXPECT: Some feelings of bloating in the abdomen. Passage of more gas than usual.  Walking can help get rid of the air that was put into your GI tract during the procedure and reduce the bloating. If you had a lower endoscopy (such as a colonoscopy or flexible sigmoidoscopy) you may notice spotting of blood in your stool or on the toilet paper. If you underwent a bowel prep for your procedure, then you may not have a normal bowel movement for a few days.  DIET: Your first meal following the procedure should be a light meal and then it is ok to progress to your normal diet.  A half-sandwich or bowl of soup is an example of a good first meal.  Heavy or fried foods are harder to digest and may make you feel nauseous or bloated.  Likewise meals heavy in dairy and vegetables can cause extra gas to form and this can also increase the bloating.  Drink plenty of fluids but you should avoid alcoholic beverages for 24 hours.  ACTIVITY: Your care partner should take you home directly after the procedure.  You should plan to take it easy, moving slowly for the rest of the day.  You can resume normal activity the day after the procedure however you should NOT DRIVE or use heavy machinery for 24 hours (because of the sedation medicines used during the test).    SYMPTOMS TO REPORT IMMEDIATELY: A gastroenterologist can be reached at any hour.  During normal business hours, 8:30 AM to 5:00 PM Monday through Friday,  call [phone].  After hours and on weekends, please call the GI answering service at [phone] who will take a message and have the physician on call contact you.   Following lower endoscopy (colonoscopy or flexible sigmoidoscopy):  Excessive amounts of blood in the stool  Significant tenderness or worsening of abdominal pains  Swelling of the abdomen that is new, acute  Fever of 100F or higher  If any biopsies were taken you will be contacted by phone or by letter within the next 1-3 weeks.  Call your gastroenterologist if you have not heard about the biopsies in 3 weeks.  Our staff will call the home number listed on your records the next business day following your procedure to check on you and address any questions or concerns that you may have at that time regarding the information given to you following your procedure. This is a courtesy call and so if there is no answer at the home number and we have not heard from you through the emergency physician on call, we will assume that you have returned to your regular daily activities without incident.   Polyps, diverticulosis, and high fiber diet given.  Follow-up colonoscopy in 5 years.  SIGNATURES/CONFIDENTIALITY: You and/or your care partner have signed paperwork which will be entered into your electronic medical record.  These signatures attest to the fact that that the information above on your After Visit Summary has  been reviewed and is understood.  Full responsibility of the confidentiality of this discharge information lies with you and/or your care-partner.  

## 2012-12-01 ENCOUNTER — Telehealth: Payer: Self-pay

## 2012-12-01 NOTE — Telephone Encounter (Signed)
  Follow up Call-  Call back number 11/28/2012  Post procedure Call Back phone  # [phone]  Permission to leave phone message Yes     Patient questions:  Do you have a fever, pain , or abdominal swelling? no Pain Score  0 *  Have you tolerated food without any problems? yes  Have you been able to return to your normal activities? yes  Do you have any questions about your discharge instructions: Diet   no Medications  no Follow up visit  no  Do you have questions or concerns about your Care? no  Actions: * If pain score is 4 or above: No action needed, pain <4.

## 2012-12-04 ENCOUNTER — Encounter: Payer: Self-pay | Admitting: Internal Medicine

## 2012-12-08 ENCOUNTER — Encounter (HOSPITAL_COMMUNITY): Payer: Self-pay | Admitting: Emergency Medicine

## 2012-12-08 ENCOUNTER — Emergency Department (HOSPITAL_COMMUNITY)
Admission: EM | Admit: 2012-12-08 | Discharge: 2012-12-08 | Disposition: A | Discharge status: Home / Self Care | Payer: Medicare PPO | Attending: Emergency Medicine | Admitting: Emergency Medicine

## 2012-12-08 DIAGNOSIS — E785 Hyperlipidemia, unspecified: Secondary | ICD-10-CM | POA: Insufficient documentation

## 2012-12-08 DIAGNOSIS — Z860100 Personal history of colon polyps, unspecified: Secondary | ICD-10-CM | POA: Insufficient documentation

## 2012-12-08 DIAGNOSIS — Z8709 Personal history of other diseases of the respiratory system: Secondary | ICD-10-CM | POA: Insufficient documentation

## 2012-12-08 DIAGNOSIS — F17200 Nicotine dependence, unspecified, uncomplicated: Secondary | ICD-10-CM | POA: Insufficient documentation

## 2012-12-08 DIAGNOSIS — Z8673 Personal history of transient ischemic attack (TIA), and cerebral infarction without residual deficits: Secondary | ICD-10-CM | POA: Insufficient documentation

## 2012-12-08 DIAGNOSIS — M7989 Other specified soft tissue disorders: Secondary | ICD-10-CM | POA: Insufficient documentation

## 2012-12-08 DIAGNOSIS — Z8739 Personal history of other diseases of the musculoskeletal system and connective tissue: Secondary | ICD-10-CM | POA: Insufficient documentation

## 2012-12-08 DIAGNOSIS — Z8679 Personal history of other diseases of the circulatory system: Secondary | ICD-10-CM | POA: Insufficient documentation

## 2012-12-08 DIAGNOSIS — Z8719 Personal history of other diseases of the digestive system: Secondary | ICD-10-CM | POA: Insufficient documentation

## 2012-12-08 DIAGNOSIS — I252 Old myocardial infarction: Secondary | ICD-10-CM | POA: Insufficient documentation

## 2012-12-08 DIAGNOSIS — Z79899 Other long term (current) drug therapy: Secondary | ICD-10-CM | POA: Insufficient documentation

## 2012-12-08 DIAGNOSIS — M79606 Pain in leg, unspecified: Secondary | ICD-10-CM

## 2012-12-08 DIAGNOSIS — I10 Essential (primary) hypertension: Secondary | ICD-10-CM | POA: Insufficient documentation

## 2012-12-08 DIAGNOSIS — Z85820 Personal history of malignant melanoma of skin: Secondary | ICD-10-CM | POA: Insufficient documentation

## 2012-12-08 DIAGNOSIS — Z7982 Long term (current) use of aspirin: Secondary | ICD-10-CM | POA: Insufficient documentation

## 2012-12-08 NOTE — ED Notes (Signed)
Emotional support offered to pt.

## 2012-12-08 NOTE — ED Notes (Signed)
Pt sts went to [place] on Friday and told to come here due to elevated blood work for possible DVT; pt sts hx of legs "feeling cold" and some leg swelling and cramping x months; pt legs warm at present

## 2012-12-08 NOTE — Progress Notes (Signed)
*  PRELIMINARY RESULTS* Vascular Ultrasound Lower extremity venous duplex has been completed.  Preliminary findings: Bilaterally  No evidence of deep or superficial thrombosis.  No Baker's cyst.   Farrel Demark, RDMS, RVT 12/08/2012, 5:27 PM

## 2012-12-08 NOTE — ED Provider Notes (Signed)
History     CSN: [id number]  Arrival date & time 12/08/12  1520   First MD Initiated Contact with Patient 12/08/12 2008      Chief Complaint  Patient presents with  . Leg Swelling    (Consider location/radiation/quality/duration/timing/severity/associated sxs/prior treatment) The history is provided by the patient.   Patient here with chronic bilateral lower extremity pain and swelling. Symptoms worse with standing or amylase in. Went to the [place] clinic and has elevated blood test of some type and patient was told that based on results of that test he might have blood clots in his legs. He has been seen by mask the surgeon in the past with negative workup. He denies any chest pain or shortness of breath. Denies any weakness in his back. His symptoms haven't gone on for months he said. Legs do feel cold at times. Denies any recent trauma. No change in his bowel or bladder function. Past Medical History  Diagnosis Date  . Hypertension   . Colon polyps   . Diverticulosis   . Allergy   . Arthritis   . Cancer     skin cancer behind ear  . COPD (chronic obstructive pulmonary disease)   . Heart murmur   . Hyperlipidemia   . Myocardial infarction     unsure when  . Stroke     3.5 years ago    Past Surgical History  Procedure Date  . Brain surgery   . Cholecystectomy   . Appendectomy   . Hernia repair   . Orbitol sugery     Family History  Problem Relation Age of Onset  . Colon cancer Neg Hx   . Esophageal cancer Neg Hx   . Rectal cancer Neg Hx   . Stomach cancer Neg Hx     History  Substance Use Topics  . Smoking status: Current Every Day Smoker -- 0.5 packs/day  . Smokeless tobacco: Never Used  . Alcohol Use: Yes     Comment: 1 and 1/2 pint every week      Review of Systems  All other systems reviewed and are negative.    Allergies  Codeine and Latex  Home Medications   Current Outpatient Rx  Name  Route  Sig  Dispense  Refill  . ASPIRIN 81 MG PO  CHEW   Oral   Chew 81 mg by mouth daily.         . ATORVASTATIN CALCIUM 40 MG PO TABS   Oral   Take 40 mg by mouth every morning.         Marland Kitchen AZILSARTAN MEDOXOMIL 80 MG PO TABS   Oral   Take 1 tablet by mouth at bedtime.         Marland Kitchen VITAMIN D-3 1000 UNITS PO CAPS   Oral   Take 1 capsule by mouth daily.         Marland Kitchen CILOSTAZOL 100 MG PO TABS   Oral   Take 100 mg by mouth 2 (two) times daily.         Marland Kitchen HYDROCHLOROTHIAZIDE 12.5 MG PO CAPS   Oral   Take 12.5 mg by mouth daily.         . ISOSORBIDE MONONITRATE ER 60 MG PO TB24   Oral   Take 60 mg by mouth daily.         Marland Kitchen LISINOPRIL 20 MG PO TABS   Oral   Take 20 mg by mouth daily.         Marland Kitchen  METOPROLOL TARTRATE 100 MG PO TABS   Oral   Take 100 mg by mouth 2 (two) times daily.         Marland Kitchen NITROGLYCERIN 0.4 MG SL SUBL   Sublingual   Place 0.4 mg under the tongue every 5 (five) minutes as needed. For chest pain         . POTASSIUM CHLORIDE ER 10 MEQ PO TBCR   Oral   Take 10 mEq by mouth at bedtime.           BP 150/91  Pulse 108  Temp 98.2 F (36.8 C) (Oral)  Resp 18  SpO2 95%  Physical Exam  Nursing note and vitals reviewed. Constitutional: He is oriented to person, place, and time. He appears well-developed and well-nourished.  Non-toxic appearance. No distress.  HENT:  Head: Normocephalic and atraumatic.  Eyes: Conjunctivae normal, EOM and lids are normal. Pupils are equal, round, and reactive to light.  Neck: Normal range of motion. Neck supple. No tracheal deviation present. No mass present.  Cardiovascular: Normal rate, regular rhythm and normal heart sounds.  Exam reveals no gallop.   No murmur heard. Pulmonary/Chest: Effort normal and breath sounds normal. No stridor. No respiratory distress. He has no decreased breath sounds. He has no wheezes. He has no rhonchi. He has no rales.  Abdominal: Soft. Normal appearance and bowel sounds are normal. He exhibits no distension. There is no  tenderness. There is no rebound and no CVA tenderness.  Musculoskeletal: Normal range of motion. He exhibits no edema and no tenderness.       Patient with normal dorsalis pedis pulses bilaterally. Skin is normal. Range of motion is normal. He has neurovascular intact  Neurological: He is alert and oriented to person, place, and time. He has normal strength. No cranial nerve deficit or sensory deficit. GCS eye subscore is 4. GCS verbal subscore is 5. GCS motor subscore is 6.  Skin: Skin is warm and dry. No abrasion and no rash noted.  Psychiatric: He has a normal mood and affect. His speech is normal and behavior is normal.    ED Course  Procedures (including critical care time)  Labs Reviewed - No data to display No results found.   No diagnosis found.    MDM  Patient with negative duplex Dopplers of his legs. Suspect he has claudication and patient will be referred back to the [place] clinic        Toy Baker, MD 12/08/12 2029

## 2013-02-11 ENCOUNTER — Emergency Department (HOSPITAL_COMMUNITY): Payer: Medicare PPO

## 2013-02-11 ENCOUNTER — Inpatient Hospital Stay (HOSPITAL_COMMUNITY)
Admission: EM | Admit: 2013-02-11 | Discharge: 2013-02-13 | DRG: 897 | Disposition: A | Discharge status: Home / Self Care | Payer: Medicare PPO | Attending: Family Medicine | Admitting: Family Medicine

## 2013-02-11 ENCOUNTER — Encounter (HOSPITAL_COMMUNITY): Payer: Self-pay | Admitting: *Deleted

## 2013-02-11 DIAGNOSIS — Z9089 Acquired absence of other organs: Secondary | ICD-10-CM

## 2013-02-11 DIAGNOSIS — S37009A Unspecified injury of unspecified kidney, initial encounter: Secondary | ICD-10-CM

## 2013-02-11 DIAGNOSIS — I5030 Unspecified diastolic (congestive) heart failure: Secondary | ICD-10-CM | POA: Diagnosis present

## 2013-02-11 DIAGNOSIS — Z860100 Personal history of colon polyps, unspecified: Secondary | ICD-10-CM

## 2013-02-11 DIAGNOSIS — M129 Arthropathy, unspecified: Secondary | ICD-10-CM | POA: Diagnosis present

## 2013-02-11 DIAGNOSIS — Z7982 Long term (current) use of aspirin: Secondary | ICD-10-CM

## 2013-02-11 DIAGNOSIS — J4489 Other specified chronic obstructive pulmonary disease: Secondary | ICD-10-CM | POA: Diagnosis present

## 2013-02-11 DIAGNOSIS — E860 Dehydration: Secondary | ICD-10-CM | POA: Diagnosis present

## 2013-02-11 DIAGNOSIS — Z885 Allergy status to narcotic agent status: Secondary | ICD-10-CM

## 2013-02-11 DIAGNOSIS — I509 Heart failure, unspecified: Secondary | ICD-10-CM | POA: Diagnosis present

## 2013-02-11 DIAGNOSIS — Z86011 Personal history of benign neoplasm of the brain: Secondary | ICD-10-CM

## 2013-02-11 DIAGNOSIS — F17200 Nicotine dependence, unspecified, uncomplicated: Secondary | ICD-10-CM | POA: Diagnosis present

## 2013-02-11 DIAGNOSIS — N179 Acute kidney failure, unspecified: Secondary | ICD-10-CM | POA: Diagnosis present

## 2013-02-11 DIAGNOSIS — R55 Syncope and collapse: Secondary | ICD-10-CM

## 2013-02-11 DIAGNOSIS — Z91040 Latex allergy status: Secondary | ICD-10-CM

## 2013-02-11 DIAGNOSIS — F1010 Alcohol abuse, uncomplicated: Principal | ICD-10-CM | POA: Diagnosis present

## 2013-02-11 DIAGNOSIS — I252 Old myocardial infarction: Secondary | ICD-10-CM

## 2013-02-11 DIAGNOSIS — E872 Acidosis, unspecified: Secondary | ICD-10-CM | POA: Diagnosis present

## 2013-02-11 DIAGNOSIS — Z8673 Personal history of transient ischemic attack (TIA), and cerebral infarction without residual deficits: Secondary | ICD-10-CM

## 2013-02-11 DIAGNOSIS — I440 Atrioventricular block, first degree: Secondary | ICD-10-CM | POA: Diagnosis present

## 2013-02-11 DIAGNOSIS — Z85828 Personal history of other malignant neoplasm of skin: Secondary | ICD-10-CM

## 2013-02-11 DIAGNOSIS — J449 Chronic obstructive pulmonary disease, unspecified: Secondary | ICD-10-CM | POA: Diagnosis present

## 2013-02-11 DIAGNOSIS — E785 Hyperlipidemia, unspecified: Secondary | ICD-10-CM | POA: Diagnosis present

## 2013-02-11 DIAGNOSIS — I129 Hypertensive chronic kidney disease with stage 1 through stage 4 chronic kidney disease, or unspecified chronic kidney disease: Secondary | ICD-10-CM | POA: Diagnosis present

## 2013-02-11 DIAGNOSIS — N183 Chronic kidney disease, stage 3 unspecified: Secondary | ICD-10-CM | POA: Diagnosis present

## 2013-02-11 DIAGNOSIS — I739 Peripheral vascular disease, unspecified: Secondary | ICD-10-CM | POA: Diagnosis present

## 2013-02-11 DIAGNOSIS — Z79899 Other long term (current) drug therapy: Secondary | ICD-10-CM

## 2013-02-11 HISTORY — DX: Transient cerebral ischemic attack, unspecified: G45.9

## 2013-02-11 HISTORY — DX: Neoplasm of unspecified behavior of brain: D49.6

## 2013-02-11 HISTORY — DX: Peripheral vascular disease, unspecified: I73.9

## 2013-02-11 LAB — CBC
MCH: 34 pg (ref 26.0–34.0)
MCHC: 35.1 g/dL (ref 30.0–36.0)
Platelets: 240 10*3/uL (ref 150–400)

## 2013-02-11 LAB — PROTIME-INR: Prothrombin Time: 12.3 seconds (ref 11.6–15.2)

## 2013-02-11 LAB — TROPONIN I: Troponin I: <0.3 ng/mL (ref <0.30)

## 2013-02-11 LAB — POCT I-STAT, CHEM 8
Creatinine, Ser: 1.6 mg/dL — ABNORMAL HIGH (ref 0.50–1.35)
HCT: 49 % (ref 39.0–52.0)
Hemoglobin: 16.7 g/dL (ref 13.0–17.0)
Potassium: 3.9 mEq/L (ref 3.5–5.1)
Sodium: 142 mEq/L (ref 135–145)
TCO2: 29 mmol/L (ref 0–100)

## 2013-02-11 LAB — COMPREHENSIVE METABOLIC PANEL
Albumin: 4 g/dL (ref 3.5–5.2)
Alkaline Phosphatase: 77 U/L (ref 39–117)
BUN: 29 mg/dL — ABNORMAL HIGH (ref 6–23)
Calcium: 9.2 mg/dL (ref 8.4–10.5)
Creatinine, Ser: 1.38 mg/dL — ABNORMAL HIGH (ref 0.50–1.35)
Glucose, Bld: 101 mg/dL — ABNORMAL HIGH (ref 70–99)
Total Protein: 8 g/dL (ref 6.0–8.3)

## 2013-02-11 LAB — SAMPLE TO BLOOD BANK

## 2013-02-11 LAB — ETHANOL: Alcohol, Ethyl (B): 205 mg/dL — ABNORMAL HIGH (ref 0–11)

## 2013-02-11 LAB — CG4 I-STAT (LACTIC ACID): Lactic Acid, Venous: 3.31 mmol/L — ABNORMAL HIGH (ref 0.5–2.2)

## 2013-02-11 LAB — ACETAMINOPHEN LEVEL: Acetaminophen (Tylenol), Serum: <15 ug/mL (ref 10–30)

## 2013-02-11 MED ORDER — HEPARIN SODIUM (PORCINE) 5000 UNIT/ML IJ SOLN
5000.0000 [IU] | Freq: Three times a day (TID) | INTRAMUSCULAR | Status: DC
  Administered 2013-02-12 – 2013-02-13 (×4): 5000 [IU] via SUBCUTANEOUS
  Filled 2013-02-11 (×7): qty 1

## 2013-02-11 MED ORDER — CILOSTAZOL 100 MG PO TABS
100.0000 mg | ORAL_TABLET | Freq: Two times a day (BID) | ORAL | Status: DC
  Administered 2013-02-12 – 2013-02-13 (×4): 100 mg via ORAL
  Filled 2013-02-11 (×5): qty 1

## 2013-02-11 MED ORDER — AMLODIPINE BESYLATE 2.5 MG PO TABS
2.5000 mg | ORAL_TABLET | Freq: Every evening | ORAL | Status: DC
  Administered 2013-02-12: 2.5 mg via ORAL
  Filled 2013-02-11 (×2): qty 1

## 2013-02-11 MED ORDER — ISOSORBIDE MONONITRATE ER 60 MG PO TB24
60.0000 mg | ORAL_TABLET | Freq: Two times a day (BID) | ORAL | Status: DC
  Administered 2013-02-12 – 2013-02-13 (×4): 60 mg via ORAL
  Filled 2013-02-11 (×5): qty 1

## 2013-02-11 MED ORDER — ATORVASTATIN CALCIUM 10 MG PO TABS
10.0000 mg | ORAL_TABLET | Freq: Every evening | ORAL | Status: DC
  Administered 2013-02-12: 10 mg via ORAL
  Filled 2013-02-11 (×2): qty 1

## 2013-02-11 MED ORDER — VITAMIN D 1000 UNITS PO TABS
1000.0000 [IU] | ORAL_TABLET | Freq: Every day | ORAL | Status: DC
  Administered 2013-02-12 – 2013-02-13 (×2): 1000 [IU] via ORAL
  Filled 2013-02-11 (×2): qty 1

## 2013-02-11 MED ORDER — HYDROCODONE-ACETAMINOPHEN 5-325 MG PO TABS
1.0000 | ORAL_TABLET | ORAL | Status: DC | PRN
  Administered 2013-02-12 (×2): 2 via ORAL
  Filled 2013-02-11 (×2): qty 2

## 2013-02-11 MED ORDER — LORAZEPAM 2 MG/ML IJ SOLN: 1.0000 mg | Freq: Four times a day (QID) | INTRAMUSCULAR | Status: DC | PRN

## 2013-02-11 MED ORDER — ADULT MULTIVITAMIN W/MINERALS CH
2.0000 | ORAL_TABLET | Freq: Every day | ORAL | Status: DC
  Filled 2013-02-11: qty 2

## 2013-02-11 MED ORDER — TETANUS-DIPHTH-ACELL PERTUSSIS 5-2.5-18.5 LF-MCG/0.5 IM SUSP
0.5000 mL | Freq: Once | INTRAMUSCULAR | Status: AC
  Administered 2013-02-11: 0.5 mL via INTRAMUSCULAR
  Filled 2013-02-11: qty 0.5

## 2013-02-11 MED ORDER — THIAMINE HCL 100 MG/ML IJ SOLN
100.0000 mg | Freq: Every day | INTRAMUSCULAR | Status: DC
  Filled 2013-02-11 (×2): qty 1

## 2013-02-11 MED ORDER — LORAZEPAM 1 MG PO TABS
1.0000 mg | ORAL_TABLET | Freq: Four times a day (QID) | ORAL | Status: DC | PRN
  Administered 2013-02-12: 1 mg via ORAL
  Filled 2013-02-11: qty 1

## 2013-02-11 MED ORDER — SODIUM CHLORIDE 0.9 % IV SOLN
INTRAVENOUS | Status: AC
  Administered 2013-02-11: 23:00:00 via INTRAVENOUS

## 2013-02-11 MED ORDER — LISINOPRIL 20 MG PO TABS
20.0000 mg | ORAL_TABLET | Freq: Every evening | ORAL | Status: DC
  Filled 2013-02-11: qty 1

## 2013-02-11 MED ORDER — CYANOCOBALAMIN 500 MCG PO TABS
500.0000 ug | ORAL_TABLET | Freq: Every morning | ORAL | Status: DC
  Administered 2013-02-12 – 2013-02-13 (×2): 500 ug via ORAL
  Filled 2013-02-11 (×2): qty 1

## 2013-02-11 MED ORDER — ONDANSETRON HCL 4 MG PO TABS: 4.0000 mg | ORAL_TABLET | Freq: Four times a day (QID) | ORAL | Status: DC | PRN

## 2013-02-11 MED ORDER — NICOTINE 14 MG/24HR TD PT24
14.0000 mg | MEDICATED_PATCH | Freq: Every day | TRANSDERMAL | Status: DC
  Filled 2013-02-11 (×3): qty 1

## 2013-02-11 MED ORDER — ALBUTEROL SULFATE HFA 108 (90 BASE) MCG/ACT IN AERS
2.0000 | INHALATION_SPRAY | RESPIRATORY_TRACT | Status: DC | PRN
  Filled 2013-02-11: qty 6.7

## 2013-02-11 MED ORDER — VITAMIN B-1 100 MG PO TABS
100.0000 mg | ORAL_TABLET | Freq: Every day | ORAL | Status: DC
  Administered 2013-02-12 – 2013-02-13 (×2): 100 mg via ORAL
  Filled 2013-02-11 (×2): qty 1

## 2013-02-11 MED ORDER — ADULT MULTIVITAMIN W/MINERALS CH
1.0000 | ORAL_TABLET | Freq: Every day | ORAL | Status: DC
  Administered 2013-02-12 – 2013-02-13 (×2): 1 via ORAL
  Filled 2013-02-11 (×2): qty 1

## 2013-02-11 MED ORDER — METOPROLOL TARTRATE 100 MG PO TABS
100.0000 mg | ORAL_TABLET | Freq: Two times a day (BID) | ORAL | Status: DC
  Administered 2013-02-12 – 2013-02-13 (×4): 100 mg via ORAL
  Filled 2013-02-11 (×5): qty 1

## 2013-02-11 MED ORDER — FOLIC ACID 1 MG PO TABS
1.0000 mg | ORAL_TABLET | Freq: Every day | ORAL | Status: DC
  Administered 2013-02-12 – 2013-02-13 (×2): 1 mg via ORAL
  Filled 2013-02-11 (×2): qty 1

## 2013-02-11 MED ORDER — ASPIRIN 81 MG PO CHEW
81.0000 mg | CHEWABLE_TABLET | Freq: Every morning | ORAL | Status: DC
  Administered 2013-02-12 – 2013-02-13 (×2): 81 mg via ORAL
  Filled 2013-02-11 (×2): qty 1

## 2013-02-11 MED ORDER — SODIUM CHLORIDE 0.9 % IJ SOLN
3.0000 mL | Freq: Two times a day (BID) | INTRAMUSCULAR | Status: DC
  Administered 2013-02-11 – 2013-02-12 (×2): 3 mL via INTRAVENOUS

## 2013-02-11 MED ORDER — ONDANSETRON HCL 4 MG/2ML IJ SOLN: 4.0000 mg | Freq: Four times a day (QID) | INTRAMUSCULAR | Status: DC | PRN

## 2013-02-11 MED ORDER — HYDROCHLOROTHIAZIDE 12.5 MG PO CAPS
12.5000 mg | ORAL_CAPSULE | Freq: Every morning | ORAL | Status: DC
  Administered 2013-02-12 – 2013-02-13 (×2): 12.5 mg via ORAL
  Filled 2013-02-11 (×2): qty 1
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
## 2013-02-11 NOTE — ED Notes (Signed)
CSW familiar with pt from previous visits.  Emotional support offered to pt/family.

## 2013-02-11 NOTE — ED Notes (Signed)
Patient transported to CT 

## 2013-02-12 DIAGNOSIS — N179 Acute kidney failure, unspecified: Secondary | ICD-10-CM | POA: Diagnosis present

## 2013-02-12 DIAGNOSIS — Z8673 Personal history of transient ischemic attack (TIA), and cerebral infarction without residual deficits: Secondary | ICD-10-CM

## 2013-02-12 DIAGNOSIS — R55 Syncope and collapse: Secondary | ICD-10-CM | POA: Diagnosis present

## 2013-02-12 DIAGNOSIS — I252 Old myocardial infarction: Secondary | ICD-10-CM

## 2013-02-12 DIAGNOSIS — I10 Essential (primary) hypertension: Secondary | ICD-10-CM | POA: Diagnosis present

## 2013-02-12 LAB — COMPREHENSIVE METABOLIC PANEL
ALT: 25 U/L (ref 0–53)
AST: 30 U/L (ref 0–37)
Albumin: 3.5 g/dL (ref 3.5–5.2)
CO2: 24 mEq/L (ref 19–32)
Calcium: 8.8 mg/dL (ref 8.4–10.5)
Creatinine, Ser: 1.17 mg/dL (ref 0.50–1.35)
Sodium: 141 mEq/L (ref 135–145)
Total Protein: 7.1 g/dL (ref 6.0–8.3)

## 2013-02-12 LAB — CBC
HCT: 40.4 % (ref 39.0–52.0)
Hemoglobin: 13.8 g/dL (ref 13.0–17.0)
MCH: 34 pg (ref 26.0–34.0)
MCHC: 34.2 g/dL (ref 30.0–36.0)
MCV: 99.5 fL (ref 78.0–100.0)
RBC: 4.06 MIL/uL — ABNORMAL LOW (ref 4.22–5.81)

## 2013-02-12 LAB — URINALYSIS, MICROSCOPIC ONLY
Bilirubin Urine: NEGATIVE
Glucose, UA: NEGATIVE mg/dL
Ketones, ur: 15 mg/dL — AB
Leukocytes, UA: NEGATIVE
Nitrite: NEGATIVE
Protein, ur: NEGATIVE mg/dL

## 2013-02-12 LAB — RAPID URINE DRUG SCREEN, HOSP PERFORMED
Barbiturates: NOT DETECTED
Cocaine: NOT DETECTED

## 2013-02-12 LAB — LACTIC ACID, PLASMA: Lactic Acid, Venous: 0.8 mmol/L (ref 0.5–2.2)

## 2013-02-12 LAB — HEMOGLOBIN A1C: Hgb A1c MFr Bld: 5.7 % — ABNORMAL HIGH (ref <5.7)

## 2013-02-12 MED ORDER — TIOTROPIUM BROMIDE MONOHYDRATE 18 MCG IN CAPS
18.0000 ug | ORAL_CAPSULE | Freq: Every day | RESPIRATORY_TRACT | Status: DC
  Filled 2013-02-12 (×2): qty 5

## 2013-02-12 MED ORDER — LOSARTAN POTASSIUM 50 MG PO TABS
50.0000 mg | ORAL_TABLET | Freq: Every evening | ORAL | Status: DC
  Administered 2013-02-12: 50 mg via ORAL
  Filled 2013-02-12 (×2): qty 1

## 2013-02-12 MED ORDER — ALBUTEROL SULFATE (5 MG/ML) 0.5% IN NEBU
INHALATION_SOLUTION | RESPIRATORY_TRACT | Status: AC
  Administered 2013-02-12: 2.5 mg
  Filled 2013-02-12: qty 0.5
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
## 2013-02-12 NOTE — Progress Notes (Signed)
RT performed peak flow pre and post. Patients pre peak flow was 150 liter/min and after treatment the post peak flow was 210 liters/min. Patient showed improved flow after albuterol treatment.
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
## 2013-02-12 NOTE — Progress Notes (Signed)
Echocardiogram 2D Echocardiogram has been performed.  [name], Bobby 02/12/2013, 10:42 AM

## 2013-02-12 NOTE — Progress Notes (Signed)
VASCULAR LAB PRELIMINARY  PRELIMINARY  PRELIMINARY  PRELIMINARY  Carotid Dopplers completed.    Preliminary report:  There is <40% right ICA stenosis.  There is no left ICA stenosis.  Right vertebral artery flow not insonated.  Left vertebral flow is antegrade.  KANADY, CANDACE, RVT 02/12/2013, 3:06 PM
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
## 2013-02-13 DIAGNOSIS — S37009A Unspecified injury of unspecified kidney, initial encounter: Secondary | ICD-10-CM

## 2013-02-13 LAB — BASIC METABOLIC PANEL
Calcium: 10 mg/dL (ref 8.4–10.5)
GFR calc non Af Amer: >90 mL/min (ref >90)
Potassium: 4 mEq/L (ref 3.5–5.1)
Sodium: 135 mEq/L (ref 135–145)

## 2013-02-13 MED ORDER — TIOTROPIUM BROMIDE MONOHYDRATE 18 MCG IN CAPS: 18.0000 ug | ORAL_CAPSULE | Freq: Every day | RESPIRATORY_TRACT | Status: DC

## 2013-02-13 MED ORDER — ALBUTEROL SULFATE HFA 108 (90 BASE) MCG/ACT IN AERS: 2.0000 | INHALATION_SPRAY | RESPIRATORY_TRACT | Status: DC | PRN

## 2013-02-13 MED ORDER — ADULT MULTIVITAMIN W/MINERALS CH: 1.0000 | ORAL_TABLET | Freq: Every day | ORAL | Status: DC
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
## 2013-02-13 NOTE — Progress Notes (Addendum)
D/c instructions reviewed with pt. Copy of instructions given to pt. Pt refused wheelchair to take to entrance where wife is picking pt up. Pt steady on feet, steady gait this am, ambulating in hall. Pt d/c'd via ambulation with belongings.

## 2013-02-13 NOTE — Progress Notes (Signed)
I have seen and examined this patient, reviewed their chart. I have discussed this patient with the resident. I agree with the resident's findings, assessment and care plan.  For his chronic cough,plan to set up PFT as out patient.
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
## 2013-02-13 NOTE — Progress Notes (Signed)
Referral received today for possible drug abuse. Met with pt to discuss his use of drugs and alcohol, pt stated that he stopped drinking 3 weeks ago. CSW mentioned going to [employer], and pt stated that he go's to AA through the [place]. CSW asked about family/friends support, and pt stated that he has several children and a stable marriage of 33 years. No other CSW needs at this time and pt is being d/c home today.  CSW gave [place] and other AA phone numbers to the pt.    Sherald Barge, LCSW-A Clinical Social Worker [phone]

## 2013-02-13 NOTE — Discharge Summary (Signed)
FMTS Attending Admission Note: Kehinde Eniola,MD I  have seen and examined this patient, reviewed their chart. I have discussed this patient with the resident. I agree with the resident's findings, assessment and care plan.  For his chronic cough,plan to set up PFT as out patient.
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
## 2013-02-14 NOTE — Progress Notes (Signed)
FMTS Attending Admission Note: Bobby Eniola,MD  Patient was seen and examined by Dr Breen, I reviewed their chart. I have discussed this patient with the resident. I agree with the resident's findings, assessment and care plan. 

## 2013-02-24 ENCOUNTER — Encounter: Payer: Self-pay | Admitting: Family Medicine

## 2013-02-24 ENCOUNTER — Ambulatory Visit (INDEPENDENT_AMBULATORY_CARE_PROVIDER_SITE_OTHER): Payer: Medicare PPO | Admitting: Family Medicine

## 2013-02-24 VITALS — BP 140/83 | HR 78 | Temp 97.9°F | Ht 68.0 in | Wt 179.8 lb

## 2013-02-24 DIAGNOSIS — E785 Hyperlipidemia, unspecified: Secondary | ICD-10-CM | POA: Insufficient documentation

## 2013-02-24 DIAGNOSIS — N179 Acute kidney failure, unspecified: Secondary | ICD-10-CM

## 2013-02-24 DIAGNOSIS — F1010 Alcohol abuse, uncomplicated: Secondary | ICD-10-CM

## 2013-02-24 DIAGNOSIS — I10 Essential (primary) hypertension: Secondary | ICD-10-CM

## 2013-02-24 DIAGNOSIS — Z720 Tobacco use: Secondary | ICD-10-CM

## 2013-02-24 DIAGNOSIS — J449 Chronic obstructive pulmonary disease, unspecified: Secondary | ICD-10-CM

## 2013-02-24 DIAGNOSIS — F17200 Nicotine dependence, unspecified, uncomplicated: Secondary | ICD-10-CM

## 2013-02-24 DIAGNOSIS — R55 Syncope and collapse: Secondary | ICD-10-CM

## 2013-02-24 DIAGNOSIS — J4489 Other specified chronic obstructive pulmonary disease: Secondary | ICD-10-CM

## 2013-02-24 LAB — BASIC METABOLIC PANEL
Chloride: 103 mEq/L (ref 96–112)
Potassium: 4.1 mEq/L (ref 3.5–5.3)
Sodium: 138 mEq/L (ref 135–145)

## 2013-02-24 NOTE — Patient Instructions (Signed)
Mr. Rabel, we are glad you are feeling better.  Most importantly, YOU CANNOT DRIVE UNTIL WE FURTHER EXAMINE YOU IN THE NEXT COUPLE MONTHS.  We will repeat a couple lab tests today to make sure everything looks good and will see you back in one month.  In the meantime, we will want you to see Dr. Raymondo Band, our pharmacist, for both spirometry (testing your lungs), and smoking cessation.  Lastly, please make an appointment to see your primary care doctor at the [facility] within the next two weeks to discuss switching care, if you still want to do this.  Thank you , Dr. Paulina Fusi

## 2013-02-24 NOTE — Assessment & Plan Note (Addendum)
Continue moderate dose Lipitor 10 mg and will need to titrate up to Lipitor 40 mg at next visit for high dose statin.  Will consider rechecking in 4-6 months to evaluate for compliance/decrease in TC.  No myalgias or weakness noted.

## 2013-02-24 NOTE — Assessment & Plan Note (Signed)
Well controlled at 140/83 today.  Will not adjust any of his meds.  Will check BMET today and see back in one month.  If creatinine and K+ stable on BMET will recheck in 6 months.

## 2013-02-24 NOTE — Assessment & Plan Note (Signed)
Most likely secondary to cough causing vasovagal or hypoxia induced syncope.  Has not had episode since d/c and since he cut back on his smoking.  DO NOT FEEL COMFORTABLE LETTING HIM DRIVE AT THIS POINT SO WILL CONTINUE TO NOT ALLOW.  Will send for formal spirometry by Dr. Raymondo Band and if need to add on controller med, will want to see for at least one month on this medication before considering to give him his license back.  Will evaluate in one month.

## 2013-02-24 NOTE — Assessment & Plan Note (Signed)
Will send to Dr. Raymondo Band for [facility] tobacco abuse.

## 2013-02-24 NOTE — Progress Notes (Signed)
[name] is a 62 y.o. male who presents today for hospital f/u for HTN, COPD, Syncopal Episode, Tobacco Abuse/Alcohol Abuse.  COPD - Pt doing well on Spiriva started in the hospital.  Noticed increase in exercise tolerance from being only able to walk about 2-3 blocks to now walking half a mile.  He is taking this once a day without SE.  He had been directed the the [place] clinic to take his albuterol, one puff per day, as well.  Denies increased cough/sputum production/or lightheadedness/dizziness  HTN - Well controlled on Norvasc Zestril Imdur Lopressor.  Denies fatigue, edema, cough, HA.  Compliant with meds  Syncopal Episodes and Tobacco Abuse - Has not had another episode since being d/c from hospital.  Has decreased smoking to 1/4 ppd from 1/2 ppd and has noticed decrease cough as well.  Has not had any HA/Dizziness/Presyncopal episodes.  Pt states he has driven from his house to the gas station up the road to get coffee despite explicit instructions not to do this.    Alcohol Abuse - Denies alcohol use since d/c from hospital.  No desire to drink either.    Past Medical History  Diagnosis Date  . Hypertension   . Colon polyps   . Diverticulosis   . Allergy   . Arthritis   . COPD (chronic obstructive pulmonary disease)   . Heart murmur   . Hyperlipidemia   . Myocardial infarction     unsure when  . Cancer     skin cancer behind ear  . TIA (transient ischemic attack)     3.5 years ago  . Brain tumor     s/p surgery as infant  . PAD (peripheral artery disease)     History  Smoking status  . Current Every Day Smoker -- 1.50 packs/day for 40 years  Smokeless tobacco  . Never Used    Family History  Problem Relation Age of Onset  . Colon cancer Neg Hx   . Esophageal cancer Neg Hx   . Rectal cancer Neg Hx   . Stomach cancer Neg Hx     Current Outpatient Prescriptions on File Prior to Visit  Medication Sig Dispense Refill  . albuterol (PROVENTIL HFA;VENTOLIN HFA) 108  (90 BASE) MCG/ACT inhaler Inhale 2 puffs into the lungs every 4 (four) hours as needed for wheezing or shortness of breath.  1 Inhaler  1  . amLODipine (NORVASC) 2.5 MG tablet Take 2.5 mg by mouth every evening.      Marland Kitchen aspirin 81 MG chewable tablet Chew 81 mg by mouth every morning.       Marland Kitchen atorvastatin (LIPITOR) 10 MG tablet Take 10 mg by mouth every evening.       . Cholecalciferol (VITAMIN D-3) 1000 UNITS CAPS Take 1 capsule by mouth daily.      . cilostazol (PLETAL) 100 MG tablet Take 100 mg by mouth 2 (two) times daily.      . cyanocobalamin 500 MCG tablet Take 500 mcg by mouth every morning.      . hydrochlorothiazide (MICROZIDE) 12.5 MG capsule Take 12.5 mg by mouth every morning.       . isosorbide mononitrate (IMDUR) 60 MG 24 hr tablet Take 60 mg by mouth 2 (two) times daily.       Marland Kitchen lisinopril (PRINIVIL,ZESTRIL) 20 MG tablet Take 20 mg by mouth every evening.       . Magnesium Oxide 420 MG TABS Take 420 mg by mouth 2 (two) times  daily.      . metoprolol (LOPRESSOR) 100 MG tablet Take 100 mg by mouth 2 (two) times daily.      . Multiple Vitamin (MULTIVITAMIN WITH MINERALS) TABS Take 1 tablet by mouth daily.  30 tablet  1  . nitroGLYCERIN (NITROSTAT) 0.4 MG SL tablet Place 0.4 mg under the tongue every 5 (five) minutes as needed. For chest pain      . tiotropium (SPIRIVA) 18 MCG inhalation capsule Place 1 capsule (18 mcg total) into inhaler and inhale daily.  30 capsule  2   No current facility-administered medications on file prior to visit.    ROS: Per HPI.  All other systems reviewed and are negative.   Physical Exam Filed Vitals:   02/24/13 1435  BP: 140/83  Pulse: 78  Temp: 97.9 F (36.6 C)    Physical Examination: General appearance - alert, well appearing, and in no distress and normal appearing weight Eyes - left eye normal, right eye normal Mouth - dental hygiene poor and tongue normal Neck - supple, no significant adenopathy, carotids upstroke normal bilaterally,  no bruits Lymphatics - no palpable lymphadenopathy Chest - clear to auscultation, no wheezes, rales or rhonchi, symmetric air entry Heart - normal rate, regular rhythm, normal S1, S2, no murmurs, rubs, clicks or gallops Neurological - cranial nerves II through XII intact, DTR's normal and symmetric, motor and sensory grossly normal bilaterally Extremities - no pedal edema noted, peripheral pulses abnormal 1/4 B/L LE , + Clubbing B/L fingernails  Skin - normal coloration and turgor, no rashes, no suspicious skin lesions noted    Chemistry      Component Value Date/Time   NA 135 02/13/2013 0906   K 4.0 02/13/2013 0906   CL 95* 02/13/2013 0906   CO2 26 02/13/2013 0906   BUN 13 02/13/2013 0906   CREATININE 0.84 02/13/2013 0906      Component Value Date/Time   CALCIUM 10.0 02/13/2013 0906   ALKPHOS 71 02/12/2013 0440   AST 30 02/12/2013 0440   ALT 25 02/12/2013 0440   BILITOT 0.7 02/12/2013 0440

## 2013-02-24 NOTE — Assessment & Plan Note (Signed)
Spiriva working great for patient.  Clarified medication instructions with repeat with patient about his spiriva and albuterol instructions.  Will be using albuterol now q 6 hrs PRN for SOB or prior to walking. Will also send to Dr. Raymondo Band for Spirometry testing in case he may need controller medication like dulera/advair/symbicort.  Lastly, has cut back on his smoking from 1/2 ppd to 1/4 ppd and his symptoms have decreased.  Will have Dr. Raymondo Band see this pt as well for the [facility] for smoking cessation

## 2013-02-24 NOTE — Assessment & Plan Note (Signed)
Resolved upon d/c from the hospital.  Will recheck BMET today as we had increased his ACE.  If stable, will take off his problem list.

## 2013-02-25 ENCOUNTER — Encounter: Payer: Self-pay | Admitting: Family Medicine

## 2013-02-27 ENCOUNTER — Telehealth: Payer: Self-pay | Admitting: Family Medicine

## 2013-02-27 NOTE — Telephone Encounter (Signed)
Spoke with patient and he will send the results of test he had done so we can scan into the chart. Will work on [employer] forms

## 2013-02-27 NOTE — Telephone Encounter (Signed)
Patient wants to speak to the nurse about the breathing tests that he had done at the [place].

## 2013-03-12 ENCOUNTER — Encounter: Payer: Self-pay | Admitting: Pharmacist

## 2013-03-12 ENCOUNTER — Ambulatory Visit (INDEPENDENT_AMBULATORY_CARE_PROVIDER_SITE_OTHER): Payer: Medicare PPO | Admitting: Pharmacist

## 2013-03-12 VITALS — BP 133/87 | HR 82 | Ht 67.0 in | Wt 182.1 lb

## 2013-03-12 DIAGNOSIS — J449 Chronic obstructive pulmonary disease, unspecified: Secondary | ICD-10-CM

## 2013-03-12 NOTE — Patient Instructions (Addendum)
Thanks for coming in today.   Lung Function Tests showed Moderate Obstruction Please keep taking you Spiriva once daily and your albuterol 1 spray up to 4 times per day.   Avoid being around your wife when she smokes.  Walk more!  Quit Date is the end of the month April 30th.  Please follow up with Dr. Paulina Fusi at the end of the month OR early May.   If you are unable to quit completely please keep trying.  Your grand-children are worth the effort!

## 2013-03-12 NOTE — Progress Notes (Signed)
  Subjective:    Patient ID: [name], male    DOB: [birth year], 62 y.o.   MRN: [id number]  HPI Patient arrives in good spirits. He is interested in quitting smoking. He has been cutting back over the past few years.  Age when started using tobacco on a daily basis 13. Number of Cigarettes per day 10. Max 3.5 ppd Brand smoked Durwin Nora. Estimated Nicotine Content per Cigarette (mg) 1.  Estimated Nicotine intake per day 10mg .   Most recent quit attempt 5 years ago.  Longest time ever been tobacco free 2 weeks, only attempted twice. Triggers to use tobacco include; household members also smoke and stress.     Review of Systems     Objective:   Physical Exam  See Documentation Flowsheet (discrete results - PFTs) for complete Spirometry results. Patient provided good effort while attempting spirometry.   Lung Age = 63    Assessment & Plan:   Spirometry evaluation reveals mild/moderate obstructive lung disease corresponding to GOLD Classification 1/2 based on spirometry.  Patient has been experiencing shortness of breath for a while and taking Spiriva once daily and albuterol 1 spray up to 4 times a day. No medication changes at this time. Encouraged Tobacco Cessation.  Reviewed results of pulmonary function tests.  Pt verbalized understanding of results.  Written pt instructions provided.      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 However, it has been difficult to cut back as his wife still smokes. Did not initiate nicotine replacement tx as he would like to do it on his own. His quit date goal is April 30th. Written information provided.  F/U with Dr. Paulina Fusi at the end of the month or in early May. Total time in face-to-face counseling 25 minutes.  Patient seen with Juanita Craver, PharmD candidate. Marland Kitchen

## 2013-03-13 ENCOUNTER — Encounter: Payer: Self-pay | Admitting: Family Medicine

## 2013-03-13 NOTE — Progress Notes (Signed)
Patient ID: [name], male   DOB: 1951-06-05, 62 y.o.   MRN: [id number] Reviewed: Agree with Dr. Macky Lower documentation and management.

## 2013-03-25 ENCOUNTER — Telehealth: Payer: Self-pay | Admitting: Family Medicine
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
## 2013-04-08 ENCOUNTER — Encounter: Payer: Self-pay | Admitting: Family Medicine

## 2013-04-08 ENCOUNTER — Ambulatory Visit (INDEPENDENT_AMBULATORY_CARE_PROVIDER_SITE_OTHER): Payer: Medicare PPO | Admitting: Family Medicine

## 2013-04-08 VITALS — BP 158/76 | HR 86 | Temp 98.4°F | Ht 67.0 in | Wt 182.0 lb

## 2013-04-08 DIAGNOSIS — F17200 Nicotine dependence, unspecified, uncomplicated: Secondary | ICD-10-CM

## 2013-04-08 DIAGNOSIS — Z720 Tobacco use: Secondary | ICD-10-CM

## 2013-04-08 DIAGNOSIS — J449 Chronic obstructive pulmonary disease, unspecified: Secondary | ICD-10-CM

## 2013-04-08 DIAGNOSIS — I10 Essential (primary) hypertension: Secondary | ICD-10-CM
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
## 2013-04-08 NOTE — Assessment & Plan Note (Signed)
BP elevated today, will continue with current plan and recheck in one month.  If at that time still elevated, would increased Lisinopril to 40 mg or HCTZ to 25 mg.  BMP stable last visit and will repeat if increase meds.  If stable, would recheck in about 5-10 months.

## 2013-04-08 NOTE — Patient Instructions (Signed)
[name], congratulations on not smoking for the past 10 days!  Please continue to keep this up, we will see you back in 1 month and see how your feeling.   Thanks, Dr. Paulina Fusi
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
## 2013-04-08 NOTE — Assessment & Plan Note (Signed)
Has quit for 10-11 days now, using E-cig.  Will see back in one month to see how he is doing and please see COPD evaluation about further information regarding his license.

## 2013-04-10 ENCOUNTER — Telehealth: Payer: Self-pay | Admitting: Family Medicine

## 2013-04-10 NOTE — Telephone Encounter (Signed)
I'll come take a look at it today and call the pt.    Twana First Paulina Fusi, DO of [facility] 04/10/2013, 9:44 AM

## 2013-04-10 NOTE — Telephone Encounter (Signed)
Pt received a letter in the mail from [place] and he's not understanding it, he doesn't know if it is stating that he has cancer or not and a copy of letter is in Dr's Hess box.

## 2013-04-13 ENCOUNTER — Telehealth: Payer: Self-pay | Admitting: Family Medicine

## 2013-04-13 NOTE — Telephone Encounter (Signed)
Spoke with patient at length regarding the CT lung he had done at the [place] that found some incidental small nodules.  Recommend repeating the test in one year and went over this with him.  Pt voiced his understanding and concern and his questions were answered.   Twana First Paulina Fusi, DO of [facility] 04/13/2013, 4:58 PM

## 2013-05-11 ENCOUNTER — Ambulatory Visit (INDEPENDENT_AMBULATORY_CARE_PROVIDER_SITE_OTHER): Payer: Medicare PPO | Admitting: Family Medicine

## 2013-05-11 ENCOUNTER — Encounter: Payer: Self-pay | Admitting: Family Medicine

## 2013-05-11 VITALS — BP 142/82 | HR 86 | Temp 98.8°F | Ht 67.0 in | Wt 185.0 lb

## 2013-05-11 DIAGNOSIS — J449 Chronic obstructive pulmonary disease, unspecified: Secondary | ICD-10-CM

## 2013-05-11 DIAGNOSIS — F17200 Nicotine dependence, unspecified, uncomplicated: Secondary | ICD-10-CM

## 2013-05-11 DIAGNOSIS — I10 Essential (primary) hypertension: Secondary | ICD-10-CM

## 2013-05-11 DIAGNOSIS — Z720 Tobacco use: Secondary | ICD-10-CM

## 2013-05-11 DIAGNOSIS — E785 Hyperlipidemia, unspecified: Secondary | ICD-10-CM

## 2013-05-11 DIAGNOSIS — G5790 Unspecified mononeuropathy of unspecified lower limb: Secondary | ICD-10-CM

## 2013-05-11 DIAGNOSIS — G5792 Unspecified mononeuropathy of left lower limb: Secondary | ICD-10-CM

## 2013-05-11 MED ORDER — GABAPENTIN 100 MG PO CAPS: 300.0000 mg | ORAL_CAPSULE | Freq: Every day | ORAL | Status: DC

## 2013-05-11 NOTE — Assessment & Plan Note (Signed)
BP initially 151/88, repeat 142/82.  Would like under 150/90 since no CKD/DM and over 60.  Will continue w/ current regimen and see back in about 6 months and repeat a BMET.

## 2013-05-11 NOTE — Assessment & Plan Note (Signed)
Has been ongoing now for about 3 weeks or so.  No new trauma to the area but did have crush injury to the area about 10-15 yrs ago.  Most likely neuropathy secondary to trauma so will start neurontin low dose today and see back in one month.  If no improvement, will take off and re-evaluate for other causes.

## 2013-05-11 NOTE — Assessment & Plan Note (Signed)
No myalgias or weakness noted.  Pt does not want to go up on his lipitor at this time, but will continue to assess as he will need to increase statin to high intensity as had previous MI.

## 2013-05-11 NOTE — Assessment & Plan Note (Signed)
Has been cigarette free for over 2 months now.  Also transitioned from e-cig to plastic cigarette.  Will continue to monitor

## 2013-05-11 NOTE — Progress Notes (Signed)
[name] is a 62 y.o. male who presents today for COPD, smoking cessation, new onset neuropathy of left foot, HTN, Hyperlipidemia.  COPD - Stable, continues on spiriva, compliant, no recent coughing episodes or syncopal episodes.  Smoking Cessation - Has not smoked in over 2 months now.  About two weeks ago transitioned from e-cigarette to plastic cigarette.  No urge to smoke currently, doing very well.   Neuropathy of L foot - Has been ongoing for about 3 weeks now, no recent trauma but did have episode of crush injury about 10-15 yrs ago.  Has no tried anything for it and denies any weakness.  Does describe some paraesthesias that are pins/needles and happen occasionally but does not relate them to anything.   HTN - Stable, compliant with medications without syncope, lightheadedness, blurred vision, hypotension, cough, edema.    Hyperlipidemia - Stable on lipitor, no myalgias, weakness, compliant with medication.  Currently, does not want to go up on medication.   Past Medical History  Diagnosis Date  . Hypertension   . Colon polyps   . Diverticulosis   . Allergy   . Arthritis   . COPD (chronic obstructive pulmonary disease)   . Heart murmur   . Hyperlipidemia   . Myocardial infarction     unsure when  . Cancer     skin cancer behind ear  . TIA (transient ischemic attack)     3.5 years ago  . Brain tumor     s/p surgery as infant  . PAD (peripheral artery disease)     History  Smoking status  . Former Smoker -- 1.50 packs/day for 40 years  . Quit date: 03/27/2013  Smokeless tobacco  . Never Used    Family History  Problem Relation Age of Onset  . Colon cancer Neg Hx   . Esophageal cancer Neg Hx   . Rectal cancer Neg Hx   . Stomach cancer Neg Hx     Current Outpatient Prescriptions on File Prior to Visit  Medication Sig Dispense Refill  . albuterol (PROVENTIL HFA;VENTOLIN HFA) 108 (90 BASE) MCG/ACT inhaler Inhale 2 puffs into the lungs every 4 (four) hours as  needed for wheezing or shortness of breath.  1 Inhaler  1  . amLODipine (NORVASC) 2.5 MG tablet Take 2.5 mg by mouth every evening.      Marland Kitchen aspirin 81 MG chewable tablet Chew 81 mg by mouth every morning.       Marland Kitchen atorvastatin (LIPITOR) 10 MG tablet Take 10 mg by mouth every evening.       . Cholecalciferol (VITAMIN D-3) 1000 UNITS CAPS Take 1 capsule by mouth daily.      . cilostazol (PLETAL) 100 MG tablet Take 100 mg by mouth 2 (two) times daily.      . cyanocobalamin 500 MCG tablet Take 500 mcg by mouth every morning.      . hydrochlorothiazide (MICROZIDE) 12.5 MG capsule Take 12.5 mg by mouth every morning.       . isosorbide mononitrate (IMDUR) 60 MG 24 hr tablet Take 60 mg by mouth 2 (two) times daily.       Marland Kitchen lisinopril (PRINIVIL,ZESTRIL) 20 MG tablet Take 20 mg by mouth every evening.       . Magnesium Oxide 420 MG TABS Take 420 mg by mouth 2 (two) times daily.      . metoprolol (LOPRESSOR) 100 MG tablet Take 100 mg by mouth 2 (two) times daily.      Marland Kitchen  Multiple Vitamin (MULTIVITAMIN WITH MINERALS) TABS Take 1 tablet by mouth daily.  30 tablet  1  . nitroGLYCERIN (NITROSTAT) 0.4 MG SL tablet Place 0.4 mg under the tongue every 5 (five) minutes as needed. For chest pain      . tiotropium (SPIRIVA) 18 MCG inhalation capsule Place 1 capsule (18 mcg total) into inhaler and inhale daily.  30 capsule  2   No current facility-administered medications on file prior to visit.    ROS: Per HPI.  All other systems reviewed and are negative.   Physical Exam Filed Vitals:   05/11/13 0915  BP: 151/88  Pulse: 86  Temp: 98.8 F (37.1 C)    Physical Examination: General appearance - alert, well appearing, and in no distress Chest - clear to auscultation, no wheezes, rales or rhonchi, symmetric air entry Heart - normal rate, regular rhythm, normal S1, S2, no murmurs, rubs, clicks or gallops Neurological - No focal deficits Extremities - no pedal edema noted, peripheral pulses abnormal 1/4 B/L LE  , + Clubbing B/L fingernails  Skin - normal coloration and turgor, no rashes, no suspicious skin lesions noted

## 2013-05-11 NOTE — Patient Instructions (Signed)
Gabapentin capsules or tablets What is this medicine? GABAPENTIN (GA ba pen tin) is used to control partial seizures in adults with epilepsy. It is also used to treat certain types of nerve pain. This medicine may be used for other purposes; ask your health care provider or pharmacist if you have questions. What should I tell my health care provider before I take this medicine? They need to know if you have any of these conditions: -kidney disease -suicidal thoughts, plans, or attempt; a previous suicide attempt by you or a family member -an unusual or allergic reaction to gabapentin, other medicines, foods, dyes, or preservatives -pregnant or trying to get pregnant -breast-feeding How should I use this medicine? Take this medicine by mouth. Swallow it with a drink of water. Follow the directions on the prescription label. If this medicine upsets your stomach, take it with food or milk. Take your medicine at regular intervals. Do not take it more often than directed. If you are directed to break the 600 or 800 mg tablets in half as part of your dose, the extra half tablet should be used for the next dose. If you have not used the extra half tablet within 3 days, it should be thrown away. A special MedGuide will be given to you by the pharmacist with each prescription and refill. Be sure to read this information carefully each time. Talk to your pediatrician regarding the use of this medicine in children. Special care may be needed. Overdosage: If you think you have taken too much of this medicine contact a poison control center or emergency room at once. NOTE: This medicine is only for you. Do not share this medicine with others. What if I miss a dose? If you miss a dose, take it as soon as you can. If it is almost time for your next dose, take only that dose. Do not take double or extra doses. What may interact with this medicine? Do not take this medicine with any of the following  medications: -other gabapentin products This medicine may also interact with the following medications: -alcohol -antacids -antihistamines for allergy, cough and cold -certain medicines for anxiety or sleep -certain medicines for depression or psychotic disturbances -homatropine; hydrocodone -naproxen -narcotic medicines (opiates) for pain -phenothiazines like chlorpromazine, mesoridazine, prochlorperazine, thioridazine This list may not describe all possible interactions. Give your health care provider a list of all the medicines, herbs, non-prescription drugs, or dietary supplements you use. Also tell them if you smoke, drink alcohol, or use illegal drugs. Some items may interact with your medicine. What should I watch for while using this medicine? Visit your doctor or health care professional for regular checks on your progress. You may want to keep a record at home of how you feel your condition is responding to treatment. You may want to share this information with your doctor or health care professional at each visit. You should contact your doctor or health care professional if your seizures get worse or if you have any new types of seizures. Do not stop taking this medicine or any of your seizure medicines unless instructed by your doctor or health care professional. Stopping your medicine suddenly can increase your seizures or their severity. Wear a medical identification bracelet or chain if you are taking this medicine for seizures, and carry a card that lists all your medications. You may get drowsy, dizzy, or have blurred vision. Do not drive, use machinery, or do anything that needs mental alertness until you know  how this medicine affects you. To reduce dizzy or fainting spells, do not sit or stand up quickly, especially if you are an older patient. Alcohol can increase drowsiness and dizziness. Avoid alcoholic drinks. Your mouth may get dry. Chewing sugarless gum or sucking hard  candy, and drinking plenty of water will help. The use of this medicine may increase the chance of suicidal thoughts or actions. Pay special attention to how you are responding while on this medicine. Any worsening of mood, or thoughts of suicide or dying should be reported to your health care professional right away. Women who become pregnant while using this medicine may enroll in the [place] American Antiepileptic Drug Pregnancy Registry by calling [phone]. This registry collects information about the safety of antiepileptic drug use during pregnancy. What side effects may I notice from receiving this medicine? Side effects that you should report to your doctor or health care professional as soon as possible: -allergic reactions like skin rash, itching or hives, swelling of the face, lips, or tongue -worsening of mood, thoughts or actions of suicide or dying Side effects that usually do not require medical attention (report to your doctor or health care professional if they continue or are bothersome): -constipation -difficulty walking or controlling muscle movements -dizziness -nausea -slurred speech -tiredness -tremors -weight gain This list may not describe all possible side effects. Call your doctor for medical advice about side effects. You may report side effects to FDA at 1-800-FDA-1088. Where should I keep my medicine? Keep out of reach of children. Store at room temperature between 15 and 30 degrees C (59 and 86 degrees F). Throw away any unused medicine after the expiration date. NOTE: This sheet is a summary. It may not cover all possible information. If you have questions about this medicine, talk to your doctor, pharmacist, or health care provider.  2013, [employer]vier/Gold Standard. (12/12/2011 11:43:23 AM)

## 2013-05-11 NOTE — Assessment & Plan Note (Signed)
Stable, continue current spiriva and no smoking.  Doing excellent at this time without SE from spiriva and no syncopal episodes/coughing spells.

## 2013-05-18 ENCOUNTER — Telehealth: Payer: Self-pay | Admitting: *Deleted

## 2013-05-18 NOTE — Telephone Encounter (Signed)
Pt reports waking up from sleep after taking Gabapentin and having the "shakes" - " I can't take that stuff - I don't really know why I am taking it" " I think that I just won't take it until I hear from him" Advised pt that I would send note to MD about his concerns and get back with him. Pt states that he is no longer drinking or smoking and feels "alot better".PLease advise regarding continuance of gabapentin (300 mg q hs). Wyatt Haste, RN-BSN

## 2013-05-25 ENCOUNTER — Telehealth: Payer: Self-pay | Admitting: Family Medicine

## 2013-05-25 NOTE — Telephone Encounter (Signed)
Called pt and informed him to stop taking gabapentin and he stated that he had follow up appointment on 7/10. Wyatt Haste, RN-BSN

## 2013-05-25 NOTE — Telephone Encounter (Signed)
Basic info completed and form placed in Dr. Pricilla Riffle box. Please complete all parts that you can and return to [facility] [place]. Wyatt Haste, RN-BSN

## 2013-05-25 NOTE — Telephone Encounter (Signed)
Patient dropped off forms to be filled out for DMV.  Please call him when completed.  °

## 2013-05-25 NOTE — Telephone Encounter (Signed)
If he thinks he is getting the shakes from the medication, he can stop taking it.  Please have him schedule an appointment with me in the next couple of weeks.    Thanks, Judie Grieve

## 2013-05-27 ENCOUNTER — Telehealth: Payer: Self-pay | Admitting: *Deleted

## 2013-05-27 NOTE — Telephone Encounter (Signed)
Forms placed up front for pick up and pt contacted to let him know that they are ready. Wyatt Haste, RN-BSN

## 2013-05-28 NOTE — Telephone Encounter (Signed)
Form complete and ready for pick up.  Twana First Paulina Fusi, DO of [facility] 05/28/2013, 10:53 AM

## 2013-06-11 ENCOUNTER — Encounter: Payer: Self-pay | Admitting: Family Medicine

## 2013-06-11 ENCOUNTER — Ambulatory Visit (INDEPENDENT_AMBULATORY_CARE_PROVIDER_SITE_OTHER): Payer: Medicare PPO | Admitting: Family Medicine

## 2013-06-11 ENCOUNTER — Telehealth: Payer: Self-pay | Admitting: Family Medicine

## 2013-06-11 VITALS — BP 148/80 | Temp 97.5°F | Ht 67.0 in | Wt 183.3 lb

## 2013-06-11 DIAGNOSIS — K449 Diaphragmatic hernia without obstruction or gangrene: Secondary | ICD-10-CM

## 2013-06-11 DIAGNOSIS — J449 Chronic obstructive pulmonary disease, unspecified: Secondary | ICD-10-CM

## 2013-06-11 DIAGNOSIS — I10 Essential (primary) hypertension: Secondary | ICD-10-CM

## 2013-06-11 DIAGNOSIS — E785 Hyperlipidemia, unspecified: Secondary | ICD-10-CM

## 2013-06-11 DIAGNOSIS — G5790 Unspecified mononeuropathy of unspecified lower limb: Secondary | ICD-10-CM

## 2013-06-11 DIAGNOSIS — Z720 Tobacco use: Secondary | ICD-10-CM

## 2013-06-11 DIAGNOSIS — F17200 Nicotine dependence, unspecified, uncomplicated: Secondary | ICD-10-CM

## 2013-06-11 DIAGNOSIS — G5792 Unspecified mononeuropathy of left lower limb: Secondary | ICD-10-CM

## 2013-06-11 NOTE — Telephone Encounter (Signed)
[name], Can we send his surgery referral to [facility] please.  Thanks, Judie Grieve

## 2013-06-11 NOTE — Progress Notes (Signed)
[name] is a 62 y.o. male who presents today for hiatal hernia recurrence, HTN, COPD, Smoking cessation, and L foot pain.   Hiatal Hernia - Pt w/ previous hx of nissen fundoplication due to hiatal hernia.  Pt has noticed his Sx have gotten worse over the past 2-3 months with fullness/SOB with bending over.  States he also feels retrosternal fullness w/ laying down but denies nausea, vomiting, dyspepsia, reflux sx.  Has not tried any OTC medications and denies any related SOB, CP, heartburn, dizziness.  Interested in going to general surgery for re-evaluation.  Pt did have CT done in 01/2013 at the [facility] hospital showing small hiatal hernia.    HTN - BP elevated today, secondary to stress per patient.  At 158/94, pt states he is compliant with his medications and denies missing any recent doses.  On Norvasc, HCTZ, Lisinopril, Lopressor w/o edema, decreased energy, cough, hypotension.    Smoking Cessation - Pt has not smoked in three months now.  Initially tried e-cigarette, transitioned to plastic stick, and now using cinnamon lozenges.  No desire to start smoking again.    L Foot Pain/Neuropathy - Pt describes intermittent pain that has improved since last visit.  Did not take the Neurontin due to shaking following administration.  No weakness noted.   HLD - Stable, continuing on Lipitor w/o myalgias or weakness noted.  Compliant w/ medication.   Past Medical History  Diagnosis Date  . Hypertension   . Colon polyps   . Diverticulosis   . Allergy   . Arthritis   . COPD (chronic obstructive pulmonary disease)   . Heart murmur   . Hyperlipidemia   . Myocardial infarction     unsure when  . Cancer     skin cancer behind ear  . TIA (transient ischemic attack)     3.5 years ago  . Brain tumor     s/p surgery as infant  . PAD (peripheral artery disease)     History  Smoking status  . Former Smoker -- 1.50 packs/day for 40 years  . Quit date: 03/27/2013  Smokeless tobacco  . Never  Used    Family History  Problem Relation Age of Onset  . Colon cancer Neg Hx   . Esophageal cancer Neg Hx   . Rectal cancer Neg Hx   . Stomach cancer Neg Hx     Current Outpatient Prescriptions on File Prior to Visit  Medication Sig Dispense Refill  . albuterol (PROVENTIL HFA;VENTOLIN HFA) 108 (90 BASE) MCG/ACT inhaler Inhale 2 puffs into the lungs every 4 (four) hours as needed for wheezing or shortness of breath.  1 Inhaler  1  . amLODipine (NORVASC) 2.5 MG tablet Take 2.5 mg by mouth every evening.      Marland Kitchen aspirin 81 MG chewable tablet Chew 81 mg by mouth every morning.       Marland Kitchen atorvastatin (LIPITOR) 10 MG tablet Take 10 mg by mouth every evening.       . Cholecalciferol (VITAMIN D-3) 1000 UNITS CAPS Take 1 capsule by mouth daily.      . cilostazol (PLETAL) 100 MG tablet Take 100 mg by mouth 2 (two) times daily.      . cyanocobalamin 500 MCG tablet Take 500 mcg by mouth every morning.      . gabapentin (NEURONTIN) 100 MG capsule Take 3 capsules (300 mg total) by mouth at bedtime.  90 capsule  3  . hydrochlorothiazide (MICROZIDE) 12.5 MG capsule Take 12.5  mg by mouth every morning.       . isosorbide mononitrate (IMDUR) 60 MG 24 hr tablet Take 60 mg by mouth 2 (two) times daily.       Marland Kitchen lisinopril (PRINIVIL,ZESTRIL) 20 MG tablet Take 20 mg by mouth every evening.       . Magnesium Oxide 420 MG TABS Take 420 mg by mouth 2 (two) times daily.      . metoprolol (LOPRESSOR) 100 MG tablet Take 100 mg by mouth 2 (two) times daily.      . Multiple Vitamin (MULTIVITAMIN WITH MINERALS) TABS Take 1 tablet by mouth daily.  30 tablet  1  . nitroGLYCERIN (NITROSTAT) 0.4 MG SL tablet Place 0.4 mg under the tongue every 5 (five) minutes as needed. For chest pain      . tiotropium (SPIRIVA) 18 MCG inhalation capsule Place 1 capsule (18 mcg total) into inhaler and inhale daily.  30 capsule  2   No current facility-administered medications on file prior to visit.    ROS: Per HPI.  All other systems  reviewed and are negative.   Physical Exam Filed Vitals:   06/11/13 0909  BP: 158/94  Temp: 97.5 F (36.4 C)    Physical Examination: General appearance - alert, well appearing, and in no distress Heart - normal rate and regular rhythm, no murmurs appreciated  Abdomen - Soft/NT/ND, no epigastric tenderness, + ventral hernia reducible     Lab Results  Component Value Date   HGBA1C 5.7* 02/12/2013

## 2013-06-11 NOTE — Assessment & Plan Note (Signed)
Pt w/ moderate COPD stable on Spiriva and breakthrough albuterol. No increased dyspnea, cough, increased sputum.

## 2013-06-11 NOTE — Telephone Encounter (Signed)
Patient is calling because [facility] doesn't take his insurance.  He needs to speak with someone today about what his insurance has told him needs to happen.

## 2013-06-11 NOTE — Assessment & Plan Note (Signed)
Smoking cessation successful now for > 3 months.  Using cinnamon lozenges for supplementation.

## 2013-06-11 NOTE — Telephone Encounter (Signed)
There is not a [facility].   [facility] is the only group in [place].  Ileana Ladd

## 2013-06-11 NOTE — Assessment & Plan Note (Signed)
Stable, on Lipitor 20 mg, no myalgias or weakness noted.  Will try to continue to increase to 40 mg due to previous MI.

## 2013-06-11 NOTE — Assessment & Plan Note (Signed)
Pt w/ previous Nissen fundoplication w/ residual small hiatal hernia on CT from 01/2013.  Having increased Sx, will send to [facility] for evaluation.

## 2013-06-11 NOTE — Patient Instructions (Signed)
Hiatal Hernia A hiatal hernia occurs when a part of the stomach slides above the diaphragm. The diaphragm is the thin muscle separating the belly (abdomen) from the chest. A hiatal hernia can be something you are born with or develop over time. Hiatal hernias may allow stomach acid to flow back into your esophagus, the tube which carries food from your mouth to your stomach. If this acid causes problems it is called GERD (gastro-esophageal reflux disease).  SYMPTOMS  Common symptoms of GERD are heartburn (burning in your chest). This is worse when lying down or bending over. It may also cause belching and indigestion. Some of the things which make GERD worse are:  Increased weight pushes on stomach making acid rise more easily.  Smoking markedly increases acid production.  Alcohol decreases lower esophageal sphincter pressure (valve between stomach and esophagus), allowing acid from stomach into esophagus.  Late evening meals and going to bed with a full stomach increases pressure.  Anything that causes an increase in acid production.  Lower esophageal sphincter incompetence. DIAGNOSIS  Hiatal hernia is often diagnosed with x-rays of your stomach and small bowel. This is called an UGI (upper gastrointestinal x-ray). Sometimes a gastroscopic procedure is done. This is a procedure where your caregiver uses a flexible instrument to look into the stomach and small bowel. HOME CARE INSTRUCTIONS   Try to achieve and maintain an ideal body weight.  Avoid drinking alcoholic beverages.  Stop smoking.  Put the head of your bed on 4 to 6 inch blocks. This will keep your head and esophagus higher than your stomach. If you cannot use blocks, sleep with several pillows under your head and shoulders.  Over-the-counter medications will decrease acid production. Your caregiver can also prescribe medications for this. Take as directed.  1/2 to 1 teaspoon of an antacid taken every hour while awake, with  meals and at bedtime, will neutralize acid.  Do not take aspirin, ibuprofen (Advil or Motrin), or other nonsteroidal anti-inflammatory drugs.  Do not wear tight clothing around your chest or stomach.  Eat smaller meals and eat more frequently. This keeps your stomach from getting too full. Eat slowly.  Do not lie down for 2 or 3 hours after eating. Do not eat or drink anything 1 to 2 hours before going to bed.  Avoid caffeine beverages (colas, coffee, cocoa, tea), fatty foods, citrus fruits and all other foods and drinks that contain acid and that seem to increase the problems.  Avoid bending over, especially after eating. Also avoid straining during bowel movements or when urinating or lifting things. Anything that increases the pressure in your belly increases the amount of acid that may be pushed up into your esophagus. SEEK IMMEDIATE MEDICAL CARE IF:  There is change in location (pain in arms, neck, jaw, teeth or back) of your pain, or the pain is getting worse.  You also experience nausea, vomiting, sweating (diaphoresis), or shortness of breath.  You develop continual vomiting, vomit blood or coffee ground material, have bright red blood in your stools, or have black tarry stools. Some of these symptoms could signal other problems such as heart disease. MAKE SURE YOU:   Understand these instructions.  Monitor your condition.  Contact your caregiver if you are not doing well or are getting worse. Document Released: 02/09/2004 Document Revised: 02/11/2012 Document Reviewed: 11/19/2005 ExitCare Patient Information 2014 [employer].  

## 2013-06-11 NOTE — Assessment & Plan Note (Signed)
BP stable, repeat 148/80 and under 150/90.  Repeat BMET in about 5 months.

## 2013-06-11 NOTE — Assessment & Plan Note (Signed)
Had trouble w/ the neurontin, however, stable off this with intermittent pain.  No concerns at this time.

## 2013-06-11 NOTE — Telephone Encounter (Signed)
Pt called to let Dr. Paulina Fusi know that [facility] will not except his insurance, but [facility] will so can you change to them. JW

## 2013-06-12 NOTE — Telephone Encounter (Signed)
The patient is calling back because he is sure that the Surgeon is

## 2013-06-12 NOTE — Telephone Encounter (Signed)
Referral is in.  Thanks, Twana First. Paulina Fusi, DO of [facility] 06/12/2013, 1:42 PM

## 2013-06-12 NOTE — Telephone Encounter (Signed)
Related message,pt voiced understanding. [name] S  

## 2013-06-12 NOTE — Telephone Encounter (Signed)
Ok, thanks for the info.  Bobby First [name], DO of [facility] 06/12/2013, 8:06 AM

## 2013-06-12 NOTE — Telephone Encounter (Signed)
The patient is calling back because he needs a referral to what we believe is [facility].  I called [facility] and he is a patient there.  He sees Dr. Marina Goodell and they have referred him to [facility] previously.  It looks like he needs a referral to [facility] for his Hiatal Hernia and they will assist him with finding a Surgeon who does take his insurance.  He may have to go to [facility].

## 2013-06-22 ENCOUNTER — Encounter: Payer: Self-pay | Admitting: Internal Medicine

## 2013-06-22 ENCOUNTER — Ambulatory Visit (INDEPENDENT_AMBULATORY_CARE_PROVIDER_SITE_OTHER): Payer: Medicare PPO | Admitting: Internal Medicine

## 2013-06-22 VITALS — BP 130/80 | HR 80 | Ht 67.75 in | Wt 182.5 lb

## 2013-06-22 DIAGNOSIS — R933 Abnormal findings on diagnostic imaging of other parts of digestive tract: Secondary | ICD-10-CM

## 2013-06-22 DIAGNOSIS — K219 Gastro-esophageal reflux disease without esophagitis: Secondary | ICD-10-CM

## 2013-06-22 DIAGNOSIS — R141 Gas pain: Secondary | ICD-10-CM

## 2013-06-22 DIAGNOSIS — R143 Flatulence: Secondary | ICD-10-CM

## 2013-06-22 DIAGNOSIS — Z86010 Personal history of colonic polyps: Secondary | ICD-10-CM

## 2013-06-22 MED ORDER — RESTORA PO CAPS: 1.0000 | ORAL_CAPSULE | Freq: Every day | ORAL | Status: DC

## 2013-06-22 MED ORDER — OMEPRAZOLE 40 MG PO CPDR: 40.0000 mg | DELAYED_RELEASE_CAPSULE | Freq: Every day | ORAL | Status: DC
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
## 2013-06-22 NOTE — Patient Instructions (Addendum)
We have sent the following medications to your pharmacy for you to pick up at your convenience:  Omeprazole  We have given you samples of Restora. This puts good bacteria back into your colon. You should take 1 capsule by mouth once daily. If this works well for you, it can be purchased over the counter.

## 2013-07-03 ENCOUNTER — Telehealth: Payer: Self-pay | Admitting: Internal Medicine

## 2013-07-07 NOTE — Telephone Encounter (Signed)
Left message explaining that Restora was a probiotic that could be purchased over the counter, as well as Librarian, academic or [employer].  I asked him to call me back if he had any further questions

## 2013-08-07 ENCOUNTER — Ambulatory Visit (INDEPENDENT_AMBULATORY_CARE_PROVIDER_SITE_OTHER): Payer: Medicare PPO | Admitting: Family Medicine

## 2013-08-07 ENCOUNTER — Encounter: Payer: Self-pay | Admitting: Family Medicine

## 2013-08-07 VITALS — BP 144/81 | HR 84 | Temp 99.3°F | Ht 68.0 in | Wt 185.0 lb

## 2013-08-07 DIAGNOSIS — K449 Diaphragmatic hernia without obstruction or gangrene: Secondary | ICD-10-CM

## 2013-08-07 DIAGNOSIS — E785 Hyperlipidemia, unspecified: Secondary | ICD-10-CM

## 2013-08-07 DIAGNOSIS — Z720 Tobacco use: Secondary | ICD-10-CM

## 2013-08-07 DIAGNOSIS — F17200 Nicotine dependence, unspecified, uncomplicated: Secondary | ICD-10-CM

## 2013-08-07 DIAGNOSIS — I10 Essential (primary) hypertension: Secondary | ICD-10-CM

## 2013-08-07 DIAGNOSIS — J449 Chronic obstructive pulmonary disease, unspecified: Secondary | ICD-10-CM

## 2013-08-07 DIAGNOSIS — I739 Peripheral vascular disease, unspecified: Secondary | ICD-10-CM

## 2013-08-07 MED ORDER — ATORVASTATIN CALCIUM 40 MG PO TABS: 40.0000 mg | ORAL_TABLET | Freq: Every day | ORAL | Status: DC

## 2013-08-07 NOTE — Assessment & Plan Note (Signed)
Stable, continue on Spiriva and albuterol PRN.

## 2013-08-07 NOTE — Assessment & Plan Note (Signed)
Saw Dr. Marina Goodell in July 2014, who recommended Sx management with Prilosec and Probiotic.  No complaints today.
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
## 2013-08-07 NOTE — Assessment & Plan Note (Signed)
C/O muscle cramps in the middle of the night, most likely related to his PAD.  Will be going for LE dopplers and ABIs at the [place] w/in the next month.  Will f/u after this.

## 2013-08-07 NOTE — Patient Instructions (Signed)
Peripheral Vascular Disease Peripheral Vascular Disease (PVD), also called Peripheral Arterial Disease (PAD), is a circulation problem caused by cholesterol (atherosclerotic plaque) deposits in the arteries. PVD commonly occurs in the lower extremities (legs) but it can occur in other areas of the body, such as your arms. The cholesterol buildup in the arteries reduces blood flow which can cause pain and other serious problems. The presence of PVD can place a person at risk for Coronary Artery Disease (CAD).  CAUSES  Causes of PVD can be many. It is usually associated with more than one risk factor such as:   High Cholesterol.  Smoking.  Diabetes.  Lack of exercise or inactivity.  High blood pressure (hypertension).  Obesity.  Family history. SYMPTOMS   When the lower extremities are affected, patients with PVD may experience:  Leg pain with exertion or physical activity. This is called INTERMITTENT CLAUDICATION. This may present as cramping or numbness with physical activity. The location of the pain is associated with the level of blockage. For example, blockage at the abdominal level (distal abdominal aorta) may result in buttock or hip pain. Lower leg arterial blockage may result in calf pain.  As PVD becomes more severe, pain can develop with less physical activity.  In people with severe PVD, leg pain may occur at rest.  Other PVD signs and symptoms:  Leg numbness or weakness.  Coldness in the affected leg or foot, especially when compared to the other leg.  A change in leg color.  Patients with significant PVD are more prone to ulcers or sores on toes, feet or legs. These may take longer to heal or may reoccur. The ulcers or sores can become infected.  If signs and symptoms of PVD are ignored, gangrene may occur. This can result in the loss of toes or loss of an entire limb.  Not all leg pain is related to PVD. Other medical conditions can cause leg pain such  as:  Blood clots (embolism) or Deep Vein Thrombosis.  Inflammation of the blood vessels (vasculitis).  Spinal stenosis. DIAGNOSIS  Diagnosis of PVD can involve several different types of tests. These can include:  Pulse Volume Recording Method (PVR). This test is simple, painless and does not involve the use of X-rays. PVR involves measuring and comparing the blood pressure in the arms and legs. An ABI (Ankle-Brachial Index) is calculated. The normal ratio of blood pressures is 1. As this number becomes smaller, it indicates more severe disease.  < 0.95  indicates significant narrowing in one or more leg vessels.  <0.8 there will usually be pain in the foot, leg or buttock with exercise.  <0.4 will usually have pain in the legs at rest.  <0.25  usually indicates limb threatening PVD.  Doppler detection of pulses in the legs. This test is painless and checks to see if you have a pulses in your legs/feet.  A dye or contrast material (a substance that highlights the blood vessels so they show up on x-ray) may be given to help your caregiver better see the arteries for the following tests. The dye is eliminated from your body by the kidney's. Your caregiver may order blood work to check your kidney function and other laboratory values before the following tests are performed:  Magnetic Resonance Angiography (MRA). An MRA is a picture study of the blood vessels and arteries. The MRA machine uses a large magnet to produce images of the blood vessels.  Computed Tomography Angiography (CTA). A CTA is a   specialized x-ray that looks at how the blood flows in your blood vessels. An IV may be inserted into your arm so contrast dye can be injected.  Angiogram. Is a procedure that uses x-rays to look at your blood vessels. This procedure is minimally invasive, meaning a small incision (cut) is made in your groin. A small tube (catheter) is then inserted into the artery of your groin. The catheter is  guided to the blood vessel or artery your caregiver wants to examine. Contrast dye is injected into the catheter. X-rays are then taken of the blood vessel or artery. After the images are obtained, the catheter is taken out. TREATMENT  Treatment of PVD involves many interventions which may include:  Lifestyle changes:  Quitting smoking.  Exercise.  Following a low fat, low cholesterol diet.  Control of diabetes.  Foot care is very important to the PVD patient. Good foot care can help prevent infection.  Medication:  Cholesterol-lowering medicine.  Blood pressure medicine.  Anti-platelet drugs.  Certain medicines may reduce symptoms of Intermittent Claudication.  Interventional/Surgical options:  Angioplasty. An Angioplasty is a procedure that inflates a balloon in the blocked artery. This opens the blocked artery to improve blood flow.  Stent Implant. A wire mesh tube (stent) is placed in the artery. The stent expands and stays in place, allowing the artery to remain open.  Peripheral Bypass Surgery. This is a surgical procedure that reroutes the blood around a blocked artery to help improve blood flow. This type of procedure may be performed if Angioplasty or stent implants are not an option. SEEK IMMEDIATE MEDICAL CARE IF:   You develop pain or numbness in your arms or legs.  Your arm or leg turns cold, becomes blue in color.  You develop redness, warmth, swelling and pain in your arms or legs. MAKE SURE YOU:   Understand these instructions.  Will watch your condition.  Will get help right away if you are not doing well or get worse. Document Released: 12/27/2004 Document Revised: 02/11/2012 Document Reviewed: 11/23/2008 ExitCare Patient Information 2014 [employer].  

## 2013-08-07 NOTE — Assessment & Plan Note (Signed)
Will increase Lipitor to 40 mg today and recheck Direct LDL and CMP in three months.

## 2013-08-07 NOTE — Assessment & Plan Note (Signed)
Smoking Cessation > 4-6 months now.  Continue the great work.

## 2013-08-07 NOTE — Assessment & Plan Note (Signed)
Stable, 144/81 today.  Continue with current medications and will get CMET and CBC at next visit.

## 2013-09-07 ENCOUNTER — Other Ambulatory Visit (HOSPITAL_COMMUNITY): Payer: Self-pay | Admitting: Family Medicine

## 2013-10-06 ENCOUNTER — Ambulatory Visit (INDEPENDENT_AMBULATORY_CARE_PROVIDER_SITE_OTHER): Payer: Medicare PPO | Admitting: Family Medicine

## 2013-10-06 ENCOUNTER — Encounter: Payer: Self-pay | Admitting: Family Medicine

## 2013-10-06 VITALS — BP 162/96 | HR 70 | Temp 98.2°F | Ht 68.0 in | Wt 191.7 lb

## 2013-10-06 DIAGNOSIS — S0003XA Contusion of scalp, initial encounter: Secondary | ICD-10-CM

## 2013-10-06 DIAGNOSIS — I10 Essential (primary) hypertension: Secondary | ICD-10-CM

## 2013-10-06 DIAGNOSIS — S0083XA Contusion of other part of head, initial encounter: Secondary | ICD-10-CM | POA: Insufficient documentation

## 2013-10-06 NOTE — Assessment & Plan Note (Signed)
BP stable at 140/90 today on repeat.  Continue with current medications, and will have him fax/bring a copy of his recent lab work from the [place] for the CBC/CMET.
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
## 2013-10-06 NOTE — Patient Instructions (Signed)
Tallis, it was nice seeing you today.  Please follow up in about three-six months to check on your blood pressure and COPD.  If you need to see [place] sooner, please let us know.  As well you can make another appointment if the tender area on your face does not disappear in one to two months.  Please drop off your lab work from the [place] when you get the chance.  Thanks, Dr. Paulina Fusi
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
## 2013-11-19 ENCOUNTER — Ambulatory Visit (INDEPENDENT_AMBULATORY_CARE_PROVIDER_SITE_OTHER): Payer: Medicare PPO | Admitting: Family Medicine

## 2013-11-19 ENCOUNTER — Encounter: Payer: Self-pay | Admitting: Family Medicine

## 2013-11-19 ENCOUNTER — Ambulatory Visit
Admission: RE | Admit: 2013-11-19 | Discharge: 2013-11-19 | Disposition: A | Discharge status: Home / Self Care | Payer: Commercial Managed Care - HMO | Source: Ambulatory Visit | Attending: Family Medicine | Admitting: Family Medicine

## 2013-11-19 ENCOUNTER — Other Ambulatory Visit: Payer: Self-pay | Admitting: Family Medicine

## 2013-11-19 VITALS — BP 180/110 | HR 84 | Ht 68.0 in | Wt 194.9 lb

## 2013-11-19 DIAGNOSIS — M25562 Pain in left knee: Secondary | ICD-10-CM | POA: Insufficient documentation

## 2013-11-19 DIAGNOSIS — M25569 Pain in unspecified knee: Secondary | ICD-10-CM

## 2013-11-19 DIAGNOSIS — I160 Hypertensive urgency: Secondary | ICD-10-CM

## 2013-11-19 DIAGNOSIS — I10 Essential (primary) hypertension: Secondary | ICD-10-CM

## 2013-11-19 NOTE — Progress Notes (Signed)
[name] is a 62 y.o. male who presents for L Knee Pain.  Pt with 2 weeks of left medial knee pain that did not begin with inciting injury and he denies any previous injury to that area as well.  Described as dull achy pain, worse at the end of the night or with weight bearing/going up hills/out of chair.  Denies any locking, catching, or giving way, but does have tenderness in the medial knee area.  Has had minimal effusion to the joint but denies any history of gout, any direct injury, any erythema, warmness.  Has had decreased ROM secondary to inflammation.   PMH reviewed.  History  Substance Use Topics  . Smoking status: Former Smoker -- 1.50 packs/day for 40 years    Quit date: 03/27/2013  . Smokeless tobacco: Never Used  . Alcohol Use: Yes     Comment: 1 and 1/2 pint every week   ROS as above otherwise neg   Exam:  BP 180/110  Pulse 84  Ht 5\' 8"  (1.727 m)  Wt 194 lb 14.4 oz (88.406 kg)  BMI 29.64 kg/m2 Gen: Well NAD Cardio: RRR, No M/G/R Pulm: CTAB Knee:  Exam: General (compare with less affected knee)  1. Observation  1. Ecchymosis no 2. Knee Effusion with obscured landmarks - Minimal suprapatellar, no erythema, no warmth  3. Previous surgical scars no 4. No Quad atrophy  2. Tenderness to Palpation  1. Patella no 2. Tibial tubercle no 3. Patellar tendon no 4. Quadriceps tendon no 5. Joint line  1. Medial  yes L Knee  2. Lateral no 3. Normal Range of Motion  1. Flexion: 135 degrees 2. Extension: 0 to 10 degrees above horizontal plane Exam: Patellofemoral  1. Patella tracking with quadriceps contraction  no 2. Patellar Apprehension Test no Exam: Anterior Cruciate Ligament (ACL) Stability Tests - Negative B/L  Exam: Posterior Cruciate Ligament (PCL) Tests - Negative B/L  Exam: Collateral ligament evaluation - Negative B/L  Exam: Meniscus Evaluation  1. McMurray's Test + L Knee  2. Apley's Compression Test - + L Knee  3. Joint line tenderness - + L  Kne  Neurovascular Status - Intact B/L LE  50% of the 25 minute visit was spent face to face with the pt discussing management, counseling, and options for his injury.

## 2013-11-19 NOTE — Assessment & Plan Note (Signed)
Pt with 2 weeks of left medial knee pain that did not begin with inciting injury and he denies any previous injury to that area as well.  Described as dull achy pain, worse at the end of the night or with weight bearing/going up hills/out of chair.  Denies any locking, catching, or giving way, but does have tenderness in the medial knee area.  Has had minimal effusion to the joint but denies any history of gout, any direct injury, any erythema, warmness.  Has had decreased ROM secondary to inflammation.  Concern for degenerative meniscal tear as he has no crepitus and no history of any injury to this knee.   + McMurrays, + Apleys, + Thessaly with stable ligament exam.  Will start with 4 View x-ray today including weight bearing A/P, lateral, 30 degree, and sunrise view and recommend ibuprofen (400 mg) 3-4 x per day and tylenol for breakthrough until follow up in two weeks.  Continue with ice and limit activity to aggravate the area.  Can use kjnee brace/sleeve for stability if he feels this helps.

## 2013-11-19 NOTE — Assessment & Plan Note (Signed)
BP 180/110 on initial and 190/100 on repeat.  Denies any blurred vision, HA, hematuria, palpitations, diplopia, increased frequency/urgency, abdominal pain, CP, SOB.  Compliant with meds w/o SE and has not taken his medications yet today.  Discussed options with pt and he would like to monitor home BP at 7 PM every night and if >180 or >100 for more than two nights in a row, he will follow up with me in one week.  As well, if he develops the above Sx, he is to go to the ED for further management.  Can consider BMET at that time to evaluate for any kidney injury as he does not want lab work today.

## 2013-11-19 NOTE — Patient Instructions (Signed)
[name], it was nice seeing you today.  I suspect you have a meniscal injury of your left knee.  We will get imaging of your knee at this time and I would like you to continue with advil (400 mg) three to four times per day for the next two weeks as well as tylenol for breakthrough pain.  Please also use ice to the area two to three times per day and limit the amount of stress you put on your knee.  We will see you back at that time and determine the next step.  Thanks, Dr. Paulina Fusi

## 2013-11-25 ENCOUNTER — Ambulatory Visit: Payer: Medicare PPO | Admitting: Family Medicine

## 2013-12-17 ENCOUNTER — Encounter: Payer: Self-pay | Admitting: Family Medicine

## 2013-12-17 ENCOUNTER — Ambulatory Visit (INDEPENDENT_AMBULATORY_CARE_PROVIDER_SITE_OTHER): Payer: Medicare PPO | Admitting: Family Medicine

## 2013-12-17 VITALS — BP 130/100 | Temp 98.8°F | Ht 68.0 in | Wt 192.2 lb

## 2013-12-17 DIAGNOSIS — M25569 Pain in unspecified knee: Secondary | ICD-10-CM

## 2013-12-17 DIAGNOSIS — M25562 Pain in left knee: Secondary | ICD-10-CM

## 2013-12-17 NOTE — Assessment & Plan Note (Signed)
Since last visit, pt has continued to ice his knee nightly and has used a supporting knee sleeve with minimal results.  His X-rays do not show evidence of any type of OA.  At this point, with his minimal locking and giving way, most likely has acute vs degenerative medial meniscal injury.  He would like to continue conservative management so will try PT for 3-4 weeks and f/u at that time.  If no improvement, will consider further imaging with MRI of knee and if + tear, referral to orthopaedic surgery for discussion for menisectomy.  Pt understands and is agreeable to this plan.

## 2013-12-17 NOTE — Progress Notes (Signed)
[name] is a 63 y.o. male who presents to [facility] today for f/u of L knee pain.  Pt with 6 weeks of left medial knee pain that did not begin with inciting injury and he denies any previous injury to that area as well. Described as dull achy pain, worse at the end of the night or with weight bearing/going up hills/out of chair. Since last visit, he has started to have intermittent locking and giving out, but no gross joint effusion or catching.   He has had difficulty at times with ambulation, especially after prolonged periods of sitting.  Denies any history of gout, any direct injury, any erythema, warmness. Has had decreased ROM secondary to inflammation.    PMH reviewed.  History  Substance Use Topics  . Smoking status: Former Smoker -- 1.50 packs/day for 40 years    Quit date: 03/27/2013  . Smokeless tobacco: Never Used  . Alcohol Use: Yes     Comment: 1 and 1/2 pint every week   ROS as above otherwise neg   Exam:  BP 130/100  Temp(Src) 98.8 F (37.1 C) (Oral)  Ht 5\' 8"  (1.727 m)  Wt 192 lb 3.2 oz (87.181 kg)  BMI 29.23 kg/m2 Gen: Well NAD Cardio: RRR, No M/G/R Pulm: CTAB Knee:  Exam: General (compare with less affected knee)  1. Observation  1. Ecchymosis no 2. Knee Effusion with obscured landmarks - Minimal suprapatellar, no erythema, no warmth  3. No Quad atrophy  2. Tenderness to Palpation  1. Patella - no 2. Tibial tubercle - no 3. Patellar tendon -no 4. Quadriceps tendon - no 5. Joint line  1. Medial - yes L Knee  2. Lateral no 3. Normal Range of Motion  1. Flexion: 135 degrees 2. Extension: 0 to 10 degrees above horizontal plane Exam: Patellofemoral  1. Patellar Apprehension Test negative Exam: Anterior Cruciate Ligament (ACL) Stability Tests - Negative B/L  Exam: Posterior Cruciate Ligament (PCL) Tests - Negative B/L  Exam: Collateral ligament evaluation - Negative B/L  Exam: Meniscus Evaluation  1. McMurray's Test + L Knee  2. Apley's Compression  Test - + L Knee  3. Joint line tenderness, medial compartment - + L Knee 4. Thessaly - + on the L side   Neurovascular Status - Intact B/L LE  Dg Knee Ap/lat W/sunrise Left 11/19/13 - L knee x-rays ordered and reviewed by myself today.  No evidence of OA with minimal joint space narrowing, no scleral thickening, and no loose bodies present in the joint space.

## 2013-12-17 NOTE — Patient Instructions (Signed)
[name], we will see you back in 6 weeks.  Please go to physical therapy and if they do not get in touch with you in the next one week, please call us back.  Thanks, Dr. Awanda Mink

## 2013-12-22 ENCOUNTER — Telehealth: Payer: Self-pay | Admitting: Family Medicine

## 2013-12-22 NOTE — Telephone Encounter (Signed)
Patient was told to call the office if he had not heard from anyone within a week about his PT referral. Please call patient with status of the referral. Thanks!

## 2013-12-23 ENCOUNTER — Ambulatory Visit: Payer: Medicare PPO | Admitting: Family Medicine

## 2013-12-24 NOTE — Telephone Encounter (Signed)
Pt called again about status of his referral

## 2014-01-04 ENCOUNTER — Ambulatory Visit: Payer: Medicare HMO | Attending: Family Medicine | Admitting: Physical Therapy

## 2014-01-04 DIAGNOSIS — R269 Unspecified abnormalities of gait and mobility: Secondary | ICD-10-CM | POA: Insufficient documentation

## 2014-01-04 DIAGNOSIS — M25569 Pain in unspecified knee: Secondary | ICD-10-CM | POA: Insufficient documentation

## 2014-01-04 DIAGNOSIS — R262 Difficulty in walking, not elsewhere classified: Secondary | ICD-10-CM | POA: Insufficient documentation

## 2014-01-04 DIAGNOSIS — M25669 Stiffness of unspecified knee, not elsewhere classified: Secondary | ICD-10-CM | POA: Insufficient documentation

## 2014-01-04 DIAGNOSIS — IMO0001 Reserved for inherently not codable concepts without codable children: Secondary | ICD-10-CM | POA: Insufficient documentation

## 2014-01-04 DIAGNOSIS — R5381 Other malaise: Secondary | ICD-10-CM | POA: Insufficient documentation

## 2014-01-13 ENCOUNTER — Ambulatory Visit: Payer: Medicare HMO

## 2014-01-19 ENCOUNTER — Encounter: Payer: Medicare PPO | Admitting: Physical Therapy

## 2014-01-20 ENCOUNTER — Encounter: Payer: Self-pay | Admitting: *Deleted

## 2014-01-21 ENCOUNTER — Ambulatory Visit: Payer: Medicare HMO | Admitting: Rehabilitation

## 2014-01-22 ENCOUNTER — Encounter: Payer: Self-pay | Admitting: Internal Medicine

## 2014-01-22 ENCOUNTER — Ambulatory Visit (INDEPENDENT_AMBULATORY_CARE_PROVIDER_SITE_OTHER): Payer: Medicare HMO | Admitting: Internal Medicine

## 2014-01-22 VITALS — BP 152/100 | HR 104 | Ht 68.0 in | Wt 190.0 lb

## 2014-01-22 DIAGNOSIS — R1032 Left lower quadrant pain: Secondary | ICD-10-CM

## 2014-01-22 DIAGNOSIS — K219 Gastro-esophageal reflux disease without esophagitis: Secondary | ICD-10-CM

## 2014-01-22 DIAGNOSIS — Z86010 Personal history of colonic polyps: Secondary | ICD-10-CM

## 2014-01-22 DIAGNOSIS — K5732 Diverticulitis of large intestine without perforation or abscess without bleeding: Secondary | ICD-10-CM

## 2014-01-22 MED ORDER — METRONIDAZOLE 500 MG PO TABS: 500.0000 mg | ORAL_TABLET | Freq: Two times a day (BID) | ORAL | Status: DC

## 2014-01-22 MED ORDER — CIPROFLOXACIN HCL 500 MG PO TABS: 500.0000 mg | ORAL_TABLET | Freq: Two times a day (BID) | ORAL | Status: DC
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
## 2014-01-22 NOTE — Patient Instructions (Signed)
Please call Vaughan Basta, Dr. Blanch Media nurse on Wednesday to give an update on how you feel.  We have sent the following medications to your pharmacy for you to pick up at your convenience: Cipro  Flagyl

## 2014-01-25 ENCOUNTER — Ambulatory Visit: Payer: Medicare HMO | Admitting: Physical Therapy

## 2014-01-27 ENCOUNTER — Encounter: Payer: Self-pay | Admitting: Family Medicine

## 2014-01-27 ENCOUNTER — Telehealth: Payer: Self-pay

## 2014-01-27 ENCOUNTER — Other Ambulatory Visit: Payer: Self-pay

## 2014-01-27 ENCOUNTER — Ambulatory Visit (INDEPENDENT_AMBULATORY_CARE_PROVIDER_SITE_OTHER): Payer: Commercial Managed Care - HMO | Admitting: Family Medicine

## 2014-01-27 VITALS — BP 174/90 | HR 102 | Temp 98.3°F | Ht 68.0 in | Wt 186.0 lb

## 2014-01-27 DIAGNOSIS — R109 Unspecified abdominal pain: Secondary | ICD-10-CM

## 2014-01-27 DIAGNOSIS — M25569 Pain in unspecified knee: Secondary | ICD-10-CM

## 2014-01-27 DIAGNOSIS — I10 Essential (primary) hypertension: Secondary | ICD-10-CM

## 2014-01-27 DIAGNOSIS — K5732 Diverticulitis of large intestine without perforation or abscess without bleeding: Secondary | ICD-10-CM | POA: Diagnosis not present

## 2014-01-27 DIAGNOSIS — M25562 Pain in left knee: Secondary | ICD-10-CM

## 2014-01-27 NOTE — Telephone Encounter (Signed)
Pt was seen by Dr. Henrene Pastor 01/22/14 and placed on Cipro and Flagyl for diverticulitis. Pt states that he is still having the pain in his left lower quadrant and does not notice any improvement. Please advise.

## 2014-01-27 NOTE — Telephone Encounter (Signed)
Contrast-enhanced CT scan of the abdomen and pelvis "rule out diverticulitis".

## 2014-01-27 NOTE — Assessment & Plan Note (Addendum)
Elevated on recheck today to 174/90, most likely combination of running out of medications 3 days ago along with increased pain.  Pt to have medications refilled today by his cardiologist and lab work done about one month ago at the [place], will have him sent in here.  F/u in 2 weeks.

## 2014-01-27 NOTE — Assessment & Plan Note (Signed)
Minimal improvement with conservative therapy.  Will order MRI today to evaluate for meniscal tear and f/u in 2 weeks.

## 2014-01-27 NOTE — Patient Instructions (Signed)
Mr. [name], we will see you back in two weeks.  Please let us know if you need anything and have your MRI performed.  Thanks, Dr. Awanda Mink

## 2014-01-27 NOTE — Assessment & Plan Note (Signed)
On cipro/flagyl, no ADR, denies abdominal pain.  Continue to follow.

## 2014-01-27 NOTE — Telephone Encounter (Signed)
Pt scheduled for CT of abdomen and pelvis at [place] CT 02/02/14 at 1pm. Pt to be NPO after 9am, drink bottle 1 of contrast at 11am, bottle 2 at 12noon. Pt aware of appt. And will pick up contrast from [place].
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
## 2014-02-01 ENCOUNTER — Inpatient Hospital Stay: Admission: RE | Admit: 2014-02-01 | Payer: Medicare PPO | Source: Ambulatory Visit

## 2014-02-02 ENCOUNTER — Ambulatory Visit (INDEPENDENT_AMBULATORY_CARE_PROVIDER_SITE_OTHER)
Admission: RE | Admit: 2014-02-02 | Discharge: 2014-02-02 | Disposition: A | Discharge status: Home / Self Care | Payer: Commercial Managed Care - HMO | Source: Ambulatory Visit | Attending: Internal Medicine | Admitting: Internal Medicine

## 2014-02-02 DIAGNOSIS — R109 Unspecified abdominal pain: Secondary | ICD-10-CM

## 2014-02-02 MED ORDER — IOHEXOL 300 MG/ML  SOLN
100.0000 mL | Freq: Once | INTRAMUSCULAR | Status: AC | PRN
  Administered 2014-02-02: 100 mL via INTRAVENOUS

## 2014-02-05 ENCOUNTER — Inpatient Hospital Stay: Admission: RE | Admit: 2014-02-05 | Payer: Commercial Managed Care - HMO | Source: Ambulatory Visit

## 2014-02-08 ENCOUNTER — Telehealth: Payer: Self-pay | Admitting: Family Medicine

## 2014-02-08 NOTE — Telephone Encounter (Signed)
Patient advised to schedule the MRI. [name], [name]

## 2014-02-08 NOTE — Telephone Encounter (Signed)
Patient needs MRI rescheduled again. It has been cancelled twice due to insurance reasons. Please call patient.

## 2014-02-09 ENCOUNTER — Ambulatory Visit: Payer: Medicare PPO | Admitting: Family Medicine

## 2014-02-09 ENCOUNTER — Ambulatory Visit
Admission: RE | Admit: 2014-02-09 | Discharge: 2014-02-09 | Disposition: A | Discharge status: Home / Self Care | Payer: Commercial Managed Care - HMO | Source: Ambulatory Visit | Attending: Family Medicine | Admitting: Family Medicine

## 2014-02-09 DIAGNOSIS — M25562 Pain in left knee: Secondary | ICD-10-CM

## 2014-02-10 ENCOUNTER — Inpatient Hospital Stay: Admission: RE | Admit: 2014-02-10 | Payer: Medicare PPO | Source: Ambulatory Visit

## 2014-02-16 ENCOUNTER — Encounter: Payer: Self-pay | Admitting: Family Medicine

## 2014-02-16 ENCOUNTER — Ambulatory Visit (INDEPENDENT_AMBULATORY_CARE_PROVIDER_SITE_OTHER): Payer: Medicare HMO | Admitting: Family Medicine

## 2014-02-16 VITALS — BP 134/78 | HR 71 | Temp 98.8°F | Ht 68.0 in | Wt 181.4 lb

## 2014-02-16 DIAGNOSIS — M25562 Pain in left knee: Secondary | ICD-10-CM

## 2014-02-16 DIAGNOSIS — M25569 Pain in unspecified knee: Secondary | ICD-10-CM

## 2014-02-16 MED ORDER — METHYLPREDNISOLONE ACETATE 40 MG/ML IJ SUSP
40.0000 mg | Freq: Once | INTRAMUSCULAR | Status: AC
  Administered 2014-02-16: 40 mg via INTRA_ARTICULAR

## 2014-02-16 NOTE — Assessment & Plan Note (Signed)
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  Informed consent obtained and placed in chart.  Time out performed.  Area cleaned with iodine x 3 and wiped clear with alcohol swab.  Using 21 1/2 gauge needle 1 cc Depo 40 and 3 cc's 1% Lidocaine were injected in medial left knee space.  Sterile bandage placed.  Patient tolerated procedure well.  No complications.

## 2014-02-16 NOTE — Patient Instructions (Signed)
Mr. Bayless, please apply Ice to the area tonight for 20 minutes and then ice the area 1-2 x per day for 20 minutes each time until I see you.  As well please take 2 pills (650 mg) of tylenol three times per day for the next two weeks.  Please continue your exercises.  Thanks, Dr. Awanda Mink

## 2014-02-16 NOTE — Progress Notes (Signed)
[name] is a 63 y.o. male who presents to [facility] today for f/u of L knee pain.  Pt with 10 weeks of left medial knee pain that did not begin with inciting injury and he denies any previous injury to that area as well. Described as dull achy pain, worse at the end of the night or with weight bearing/going up hills/out of chair. Since last visit, he had MRI showing some chondromalacia but not meniscal or ligamentous damage or compartment OA.   He has had difficulty at times with ambulation, especially after prolonged periods of sitting.  Denies any history of gout, any direct injury, any erythema, warmness. Has had decreased ROM secondary to inflammation.  Has gone to PT for 6 sessions and has been performing his knee exercises daily.  Has not been taking medications    PMH reviewed, PSHx reviewed, allergies reviewed, medications reviewed.   History  Substance Use Topics  . Smoking status: Former Smoker -- 1.50 packs/day for 40 years    Quit date: 03/27/2013  . Smokeless tobacco: Never Used     Comment: Quit 01/2013  . Alcohol Use: Yes     Comment: 1 and 1/2 pint every week   ROS as above otherwise neg    Exam:  BP 134/78  Pulse 71  Temp(Src) 98.8 F (37.1 C) (Oral)  Ht 5\' 8"  (1.727 m)  Wt 181 lb 6.4 oz (82.283 kg)  BMI 27.59 kg/m2 Gen: Well NAD Cardio: RRR, No M/G/R Pulm: CTAB Knee:  Exam: General (compare with less affected knee)  1. Observation  1. Ecchymosis no 2. Knee Effusion with obscured landmarks - Minimal suprapatellar, no erythema, no warmth  3. No Quad atrophy  2. Tenderness to Palpation  1. Patella - no 2. Tibial tubercle - no 3. Patellar tendon -no 4. Quadriceps tendon - no 5. Joint line  1. Medial - yes L Knee  2. Lateral no 3. Normal Range of Motion  1. Flexion: 135 degrees 2. Extension: 0 to 10 degrees above horizontal plane 3. + Patellar Crepitus  Exam: Patellofemoral  1. Patellar Apprehension Test negative Exam: Anterior Cruciate Ligament (ACL)  Stability Tests - Negative B/L  Exam: Posterior Cruciate Ligament (PCL) Tests - Negative B/L  Exam: Collateral ligament evaluation - Negative B/L  Exam: Meniscus Evaluation  1. McMurray's Test Neg L Knee  2. Apley's Compression Test - Neg L Knee  3. Joint line tenderness, medial compartment - + L Knee 4. Thessaly - Neg on the L side   Neurovascular Status - Intact B/L LE  MRI 02/09/14 L Knee- No meniscal tear medial or laterally, ACL MCL PCL and LCL intact.  Minimal degenerative changes in the medial and lateral compartment of the knee.  Moderate medical decision making required for this case.

## 2014-02-16 NOTE — Addendum Note (Signed)
Addended by: Burna Forts A on: 02/16/2014 03:40 PM   Modules accepted: Orders

## 2014-03-08 ENCOUNTER — Ambulatory Visit (INDEPENDENT_AMBULATORY_CARE_PROVIDER_SITE_OTHER): Payer: Commercial Managed Care - HMO | Admitting: Family Medicine

## 2014-03-08 ENCOUNTER — Encounter: Payer: Self-pay | Admitting: Family Medicine

## 2014-03-08 VITALS — BP 140/76 | HR 74 | Temp 98.6°F | Resp 14 | Wt 174.0 lb

## 2014-03-08 DIAGNOSIS — M25562 Pain in left knee: Secondary | ICD-10-CM

## 2014-03-08 DIAGNOSIS — Z87891 Personal history of nicotine dependence: Secondary | ICD-10-CM | POA: Insufficient documentation

## 2014-03-08 DIAGNOSIS — M25569 Pain in unspecified knee: Secondary | ICD-10-CM

## 2014-03-08 NOTE — Assessment & Plan Note (Signed)
Injection last time with minimal relief, and still have medial knee pain.  Nothing at the pes anserinus or posterior knee capsule (incidental small baker's cyst on MRI found) and no pain at the gastroc junction posterior knee.  All his pain is medial knee compartment, concern for very small radial tear contributing to his Sx.  Since he has performed PT x 6 months and home exercises, does not want to take medications, has had steroid injection, will refer to orthopaedics for possible arthroscopy to evaluate intraarticular cause of his pain.

## 2014-03-08 NOTE — Progress Notes (Signed)
[name] is a 63 y.o. male who presents to [facility] today for f/u of L knee pain.  Pt with 12 weeks of left medial knee pain that did not begin with inciting injury and he denies any previous injury to that area as well. Described as dull achy pain, worse at the end of the night or with weight bearing/going up hills/out of chair. Since last visit, he had steroid injection to the L knee, where he received about 2-3 days of relief prior to it wearing off.    He has had difficulty at times with ambulation, especially after prolonged periods of sitting.  Denies any history of gout, any direct injury, any erythema, warmness, no pain at the pes anserinus. Has had decreased ROM secondary to inflammation.  Has gone to PT for 6 sessions and has been performing his knee exercises daily.  Has not been taking medications    PMH reviewed, PSHx reviewed, allergies reviewed, medications reviewed.   History  Substance Use Topics  . Smoking status: Former Smoker -- 1.50 packs/day for 40 years    Quit date: 03/27/2013  . Smokeless tobacco: Never Used     Comment: Quit 01/2013  . Alcohol Use: Yes     Comment: 1 and 1/2 pint every week   ROS as above otherwise neg    Exam:  BP 140/76  Pulse 74  Temp(Src) 98.6 F (37 C)  Resp 14  Wt 174 lb (78.926 kg)  SpO2 96% Gen: Well NAD Cardio: RRR, No M/G/R Pulm: CTAB Knee:  Exam: General (compare with less affected knee)  1. Observation  1. Ecchymosis no 2. Knee Effusion with obscured landmarks - Minimal suprapatellar, no erythema, no warmth  3. No Quad atrophy  2. Tenderness to Palpation  1. Patella - no 2. Tibial tubercle - no 3. Patellar tendon -no 4. Quadriceps tendon - no 5. Joint line  1. Medial - yes L Knee  2. Lateral no 3. Normal Range of Motion  1. Flexion: 135 degrees 2. Extension: 0 to 10 degrees above horizontal plane 3. + Patellar Crepitus on L side Exam: Patellofemoral  1. Patellar Apprehension Test negative Exam: Anterior Cruciate  Ligament (ACL) Stability Tests - Negative B/L  Exam: Posterior Cruciate Ligament (PCL) Tests - Negative B/L  Exam: Collateral ligament evaluation - Negative B/L  Exam: Meniscus Evaluation  1. McMurray's Test Neg L Knee  2. Apley's Compression Test - Neg L Knee  3. Joint line tenderness, medial compartment - + L Knee 4. Thessaly - Neg on the L side   Neurovascular Status - Intact B/L LE  MRI 02/09/14 L Knee- No meniscal tear medial or laterally, ACL MCL PCL and LCL intact.  Minimal degenerative changes in the medial and lateral compartment of the knee.

## 2014-03-08 NOTE — Patient Instructions (Signed)
Knee Pain Knee pain can be a result of an injury or other medical conditions. Treatment will depend on the cause of your pain. HOME CARE  Only take medicine as told by your doctor.  Keep a healthy weight. Being overweight can make the knee hurt more.  Stretch before exercising or playing sports.  If there is constant knee pain, change the way you exercise. Ask your doctor for advice.  Make sure shoes fit well. Choose the right shoe for the sport or activity.  Protect your knees. Wear kneepads if needed.  Rest when you are tired. GET HELP RIGHT AWAY IF:   Your knee pain does not stop.  Your knee pain does not get better.  Your knee joint feels hot to the touch.  You have a fever. MAKE SURE YOU:   Understand these instructions.  Will watch this condition.  Will get help right away if you are not doing well or get worse. Document Released: 02/15/2009 Document Revised: 02/11/2012 Document Reviewed: 02/15/2009 [employer] Patient Information 2014 [employer].  

## 2014-04-27 ENCOUNTER — Ambulatory Visit: Payer: Commercial Managed Care - HMO | Admitting: Family Medicine

## 2014-05-24 ENCOUNTER — Ambulatory Visit (INDEPENDENT_AMBULATORY_CARE_PROVIDER_SITE_OTHER): Payer: Commercial Managed Care - HMO | Admitting: Family Medicine

## 2014-05-24 ENCOUNTER — Encounter: Payer: Self-pay | Admitting: Family Medicine

## 2014-05-24 VITALS — BP 160/86 | HR 96 | Temp 98.2°F | Ht 68.0 in | Wt 182.0 lb

## 2014-05-24 DIAGNOSIS — R51 Headache: Secondary | ICD-10-CM

## 2014-05-24 DIAGNOSIS — R519 Headache, unspecified: Secondary | ICD-10-CM | POA: Insufficient documentation

## 2014-05-24 MED ORDER — NAPROXEN 500 MG PO TABS: 500.0000 mg | ORAL_TABLET | Freq: Two times a day (BID) | ORAL | Status: DC

## 2014-05-24 MED ORDER — KETOROLAC TROMETHAMINE 60 MG/2ML IM SOLN
60.0000 mg | Freq: Once | INTRAMUSCULAR | Status: AC
  Administered 2014-05-24: 60 mg via INTRAMUSCULAR
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
## 2014-05-24 NOTE — Assessment & Plan Note (Addendum)
Most likely tension 2/2 increased stress. No red flags today including worst HA of life or focal neurologic deficits.  Doubt migraine as no hx and no Sx of this today and doubt cluster HA as no rhinorrhea or lacrimation.  Toradol 60 mg x 1 in clinic today, much improvement.  Will stop his excedrin and continue with tylenol PRN and start naprosyn 500 mg BID for the next 7 days starting tomorrow along with trapezius muscle exercises to decrease tension.  As well, HTN could be contributing to this as his repeat BP was 160/86, states compliance with medications.  Since ASx at this time other than possible contribution of HA, will hold off on changing his medications around.  F/U in 1 week if no improvement.

## 2014-05-24 NOTE — Patient Instructions (Signed)
[name], it was nice seeing you today.  Please start taking the naprosyn 500 mg, two times per day for the next 7 days, starting tomorrow.  As well perform the following exercises.  Thanks, Dr. Awanda Mink   [email or web address]

## 2014-07-05 ENCOUNTER — Ambulatory Visit (INDEPENDENT_AMBULATORY_CARE_PROVIDER_SITE_OTHER): Payer: Commercial Managed Care - HMO | Admitting: Family Medicine

## 2014-07-05 ENCOUNTER — Encounter: Payer: Self-pay | Admitting: Family Medicine

## 2014-07-05 VITALS — BP 140/100 | Temp 99.4°F | Ht 68.0 in | Wt 181.0 lb

## 2014-07-05 DIAGNOSIS — L723 Sebaceous cyst: Secondary | ICD-10-CM

## 2014-07-05 DIAGNOSIS — I10 Essential (primary) hypertension: Secondary | ICD-10-CM

## 2014-07-05 DIAGNOSIS — Z9889 Other specified postprocedural states: Secondary | ICD-10-CM

## 2014-07-05 DIAGNOSIS — S8010XA Contusion of unspecified lower leg, initial encounter: Secondary | ICD-10-CM

## 2014-07-05 DIAGNOSIS — L720 Epidermal cyst: Secondary | ICD-10-CM

## 2014-07-05 DIAGNOSIS — S8012XA Contusion of left lower leg, initial encounter: Secondary | ICD-10-CM | POA: Insufficient documentation

## 2014-07-05 NOTE — Assessment & Plan Note (Signed)
Discussed excision options and pt would like to monitor for now.

## 2014-07-05 NOTE — Patient Instructions (Signed)
[name], please start using ibuprofen ( 600 mg every 6-8 hours as needed), ice, and compression with rest for your left leg.  Please follow up in 2-3 weeks if no improvement, otherwise we will see you back in 2-3 months.  Thanks, Dr. Awanda Mink

## 2014-07-05 NOTE — Progress Notes (Signed)
[name] is a 63 y.o. male who presents to [facility] today for f/u of L knee pain.  Pt with 1 week history of L knee pain after falling onto the outside of his knee when mowing the yard. Described as dull achy pain, worse at the end of standing on it.  He denies any locking, catching, effusion, popping, inability to move the leg. Denies any history of gout, any direct injury, any erythema, warmness, no pain at the pes anserinus. Previous menisectomy on this knee about 2 months ago and has been doing well.    Epidermal Cyst - On frontal L forehead, slightly increasing over the last 6 months ( has been there for 2-3 yrs now) previously had one removed on the R side.  Denies any erythema, fever, chills.   HTN - Compliant with medications, doing well, denies any ADRs.   PMH reviewed, PSHx reviewed, allergies reviewed, medications reviewed.   History  Substance Use Topics  . Smoking status: Former Smoker -- 1.50 packs/day for 40 years    Quit date: 03/27/2013  . Smokeless tobacco: Never Used     Comment: Quit 01/2013  . Alcohol Use: Yes     Comment: 1 and 1/2 pint every week   ROS as above otherwise neg    Exam:  BP 140/100  Temp(Src) 99.4 F (37.4 C) (Oral)  Ht 5\' 8"  (1.727 m)  Wt 181 lb (82.101 kg)  BMI 27.53 kg/m2 Gen: Well NAD Head: + 2 x 2 nodule w/ minimal fluctuance L frontal region above medial supraorbital notch  Cardio: RRR, No M/G/R Pulm: CTAB Knee:  Exam: General (compare with less affected knee)  1. Observation  1. Ecchymosis no 2. Knee Effusion no 3. No Quad atrophy  2. Tenderness to Palpation  1. Patella - no 2. Tibial tubercle - no 3. Patellar tendon -no 4. Quadriceps tendon - no 5. Joint line  1. Medial - no  2. Lateral no 6. L fibular Head - Yes 3. Normal Range of Motion  1. Flexion: 135 degrees 2. Extension: 0 to 10 degrees above horizontal plane 3. + Patellar Crepitus on L side  Exam: Patellofemoral  1. Patellar Apprehension Test negative Exam:  Anterior Cruciate Ligament (ACL) Stability Tests - Negative B/L  Exam: Posterior Cruciate Ligament (PCL) Tests - Negative B/L  Exam: Collateral ligament evaluation - Negative B/L  Exam: Meniscus Evaluation  1. McMurray's Test Neg L Knee  2. Apley's Compression Test - Neg L Knee  3. Thessaly - Neg on the L side   Neurovascular Status - Intact B/L LE

## 2014-07-05 NOTE — Assessment & Plan Note (Signed)
Hx and Exam c/w fibular head contusion.  Previous Surgery on his left knee with menisectomy but knee exam completely benign today.  Recommend Ice, rest, Ibuprofen 600 mg three-four x per day, and knee sleeve for now and f/u in 1-2 weeks if no improvement

## 2014-07-05 NOTE — Assessment & Plan Note (Signed)
Pt compliant with meds, states stress.  Will continue current regimen and f/u in 2-3 months for lab work consideration at that time.

## 2014-07-24 ENCOUNTER — Emergency Department (HOSPITAL_COMMUNITY)
Admission: EM | Admit: 2014-07-24 | Discharge: 2014-07-24 | Discharge status: Against Medical Advice | Payer: Medicare HMO | Attending: Emergency Medicine | Admitting: Emergency Medicine

## 2014-07-24 ENCOUNTER — Emergency Department (HOSPITAL_COMMUNITY): Payer: Medicare HMO

## 2014-07-24 ENCOUNTER — Encounter (HOSPITAL_COMMUNITY): Payer: Self-pay | Admitting: Emergency Medicine

## 2014-07-24 DIAGNOSIS — Z91040 Latex allergy status: Secondary | ICD-10-CM | POA: Diagnosis not present

## 2014-07-24 DIAGNOSIS — Z7982 Long term (current) use of aspirin: Secondary | ICD-10-CM | POA: Insufficient documentation

## 2014-07-24 DIAGNOSIS — I10 Essential (primary) hypertension: Secondary | ICD-10-CM | POA: Insufficient documentation

## 2014-07-24 DIAGNOSIS — Z8673 Personal history of transient ischemic attack (TIA), and cerebral infarction without residual deficits: Secondary | ICD-10-CM | POA: Insufficient documentation

## 2014-07-24 DIAGNOSIS — Z86011 Personal history of benign neoplasm of the brain: Secondary | ICD-10-CM | POA: Diagnosis not present

## 2014-07-24 DIAGNOSIS — Z85038 Personal history of other malignant neoplasm of large intestine: Secondary | ICD-10-CM | POA: Insufficient documentation

## 2014-07-24 DIAGNOSIS — Z791 Long term (current) use of non-steroidal anti-inflammatories (NSAID): Secondary | ICD-10-CM | POA: Insufficient documentation

## 2014-07-24 DIAGNOSIS — K219 Gastro-esophageal reflux disease without esophagitis: Secondary | ICD-10-CM | POA: Insufficient documentation

## 2014-07-24 DIAGNOSIS — Z862 Personal history of diseases of the blood and blood-forming organs and certain disorders involving the immune mechanism: Secondary | ICD-10-CM | POA: Insufficient documentation

## 2014-07-24 DIAGNOSIS — J4489 Other specified chronic obstructive pulmonary disease: Secondary | ICD-10-CM | POA: Insufficient documentation

## 2014-07-24 DIAGNOSIS — R079 Chest pain, unspecified: Secondary | ICD-10-CM | POA: Insufficient documentation

## 2014-07-24 DIAGNOSIS — Z79899 Other long term (current) drug therapy: Secondary | ICD-10-CM | POA: Diagnosis not present

## 2014-07-24 DIAGNOSIS — M129 Arthropathy, unspecified: Secondary | ICD-10-CM | POA: Insufficient documentation

## 2014-07-24 DIAGNOSIS — J449 Chronic obstructive pulmonary disease, unspecified: Secondary | ICD-10-CM | POA: Diagnosis not present

## 2014-07-24 DIAGNOSIS — Z87891 Personal history of nicotine dependence: Secondary | ICD-10-CM | POA: Insufficient documentation

## 2014-07-24 DIAGNOSIS — I252 Old myocardial infarction: Secondary | ICD-10-CM | POA: Diagnosis not present

## 2014-07-24 DIAGNOSIS — Z85828 Personal history of other malignant neoplasm of skin: Secondary | ICD-10-CM | POA: Diagnosis not present

## 2014-07-24 DIAGNOSIS — R000 Tachycardia, unspecified: Secondary | ICD-10-CM | POA: Insufficient documentation

## 2014-07-24 DIAGNOSIS — R011 Cardiac murmur, unspecified: Secondary | ICD-10-CM | POA: Insufficient documentation

## 2014-07-24 DIAGNOSIS — Z8639 Personal history of other endocrine, nutritional and metabolic disease: Secondary | ICD-10-CM | POA: Insufficient documentation

## 2014-07-24 LAB — I-STAT TROPONIN, ED: Troponin i, poc: 0.03 ng/mL (ref 0.00–0.08)

## 2014-07-24 LAB — CBC
HCT: 46.4 % (ref 39.0–52.0)
Hemoglobin: 16.1 g/dL (ref 13.0–17.0)
MCH: 33.7 pg (ref 26.0–34.0)
MCHC: 34.7 g/dL (ref 30.0–36.0)
MCV: 97.1 fL (ref 78.0–100.0)
Platelets: 257 10*3/uL (ref 150–400)
RBC: 4.78 MIL/uL (ref 4.22–5.81)
RDW: 12.9 % (ref 11.5–15.5)
WBC: 8.5 10*3/uL (ref 4.0–10.5)

## 2014-07-24 LAB — BASIC METABOLIC PANEL
ANION GAP: 14 (ref 5–15)
BUN: 15 mg/dL (ref 6–23)
CO2: 24 meq/L (ref 19–32)
Calcium: 9.6 mg/dL (ref 8.4–10.5)
Chloride: 101 mEq/L (ref 96–112)
Creatinine, Ser: 0.78 mg/dL (ref 0.50–1.35)
GFR calc Af Amer: >90 mL/min (ref >90)
GFR calc non Af Amer: >90 mL/min (ref >90)
Glucose, Bld: 106 mg/dL — ABNORMAL HIGH (ref 70–99)
Potassium: 4.7 mEq/L (ref 3.7–5.3)
SODIUM: 139 meq/L (ref 137–147)

## 2014-07-24 MED ORDER — HEPARIN BOLUS VIA INFUSION
4000.0000 [IU] | Freq: Once | INTRAVENOUS | Status: DC
  Filled 2014-07-24: qty 4000

## 2014-07-24 MED ORDER — HEPARIN (PORCINE) IN NACL 100-0.45 UNIT/ML-% IJ SOLN
1200.0000 [IU]/h | INTRAMUSCULAR | Status: DC
  Filled 2014-07-24: qty 250

## 2014-07-24 MED ORDER — NITROGLYCERIN IN D5W 200-5 MCG/ML-% IV SOLN
10.0000 ug/min | INTRAVENOUS | Status: DC
  Filled 2014-07-24: qty 250

## 2014-07-24 NOTE — ED Notes (Signed)
Patient spoke with MD Audie Pinto and asked to be sent home. MD Audie Pinto made patient aware of the risks. Patient signed AMA form and made aware of the risks for leaving.
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
## 2014-07-24 NOTE — ED Notes (Signed)
Chest pain, onset last night and vomiting, pt is shaky in triage.

## 2014-07-24 NOTE — Discharge Instructions (Signed)
You were evaluated for chest pain. Our initial testing was normal. We recommended further observation with the Cardiology team. You requested to leave the ED Against Medical Advice.  For your chest pain, there is a good chance it was caused by GERD or Reflux, also alcohol can cause a gastritis with similar pain. However, as we discussed, our primary concern is your heart. We strongly recommend scheduling close follow-up with your Heart Doctor next week. Also with your primary doctor within 1 week.  Discharge Against Medical Advice I am signing this paper to show that I am leaving this hospital or health care center of my own free will. It is done against all medical advice. In doing so, I am releasing this hospital or health care center and the attending physicians from any and all claims that I may want to make. I understand that further care has been recommended. My condition may worsen. This could cause me further bodily injury, illness, or even death. I do know that the medical staff has fully explained to me the risk that I am taking in leaving against medical advice. Document Released: 11/19/2005 Document Revised: 02/11/2012 Document Reviewed: 05/06/2007 [facility] Patient Information 2015 [place], [place]. This information is not intended to replace advice given to you by your health care provider. Make sure you discuss any questions you have with your health care provider.   Chest Pain (Nonspecific) It is often hard to give a specific diagnosis for the cause of chest pain. There is always a chance that your pain could be related to something serious, such as a heart attack or a blood clot in the lungs. You need to follow up with your health care provider for further evaluation. CAUSES   Heartburn.  Pneumonia or bronchitis.  Anxiety or stress.  Inflammation around your heart (pericarditis) or lung (pleuritis or pleurisy).  A blood clot in the lung.  A collapsed lung (pneumothorax). It  can develop suddenly on its own (spontaneous pneumothorax) or from trauma to the chest.  Shingles infection (herpes zoster virus). The chest wall is composed of bones, muscles, and cartilage. Any of these can be the source of the pain.  The bones can be bruised by injury.  The muscles or cartilage can be strained by coughing or overwork.  The cartilage can be affected by inflammation and become sore (costochondritis). DIAGNOSIS  Lab tests or other studies may be needed to find the cause of your pain. Your health care provider may have you take a test called an ambulatory electrocardiogram (ECG). An ECG records your heartbeat patterns over a 24-hour period. You may also have other tests, such as:  Transthoracic echocardiogram (TTE). During echocardiography, sound waves are used to evaluate how blood flows through your heart.  Transesophageal echocardiogram (TEE).  Cardiac monitoring. This allows your health care provider to monitor your heart rate and rhythm in real time.  Holter monitor. This is a portable device that records your heartbeat and can help diagnose heart arrhythmias. It allows your health care provider to track your heart activity for several days, if needed.  Stress tests by exercise or by giving medicine that makes the heart beat faster. TREATMENT   Treatment depends on what may be causing your chest pain. Treatment may include:  Acid blockers for heartburn.  Anti-inflammatory medicine.  Pain medicine for inflammatory conditions.  Antibiotics if an infection is present.  You may be advised to change lifestyle habits. This includes stopping smoking and avoiding alcohol, caffeine, and chocolate.  You may be advised to keep your head raised (elevated) when sleeping. This reduces the chance of acid going backward from your stomach into your esophagus. Most of the time, nonspecific chest pain will improve within 2-3 days with rest and mild pain medicine.  HOME CARE  INSTRUCTIONS   If antibiotics were prescribed, take them as directed. Finish them even if you start to feel better.  For the next few days, avoid physical activities that bring on chest pain. Continue physical activities as directed.  Do not use any tobacco products, including cigarettes, chewing tobacco, or electronic cigarettes.  Avoid drinking alcohol.  Only take medicine as directed by your health care provider.  Follow your health care provider's suggestions for further testing if your chest pain does not go away.  Keep any follow-up appointments you made. If you do not go to an appointment, you could develop lasting (chronic) problems with pain. If there is any problem keeping an appointment, call to reschedule. SEEK MEDICAL CARE IF:   Your chest pain does not go away, even after treatment.  You have a rash with blisters on your chest.  You have a fever. SEEK IMMEDIATE MEDICAL CARE IF:   You have increased chest pain or pain that spreads to your arm, neck, jaw, back, or abdomen.  You have shortness of breath.  You have an increasing cough, or you cough up blood.  You have severe back or abdominal pain.  You feel nauseous or vomit.  You have severe weakness.  You faint.  You have chills. This is an emergency. Do not wait to see if the pain will go away. Get medical help at once. Call your local emergency services (911 in [place] Do not drive yourself to the hospital. MAKE SURE YOU:   Understand these instructions.  Will watch your condition.  Will get help right away if you are not doing well or get worse. Document Released: 08/29/2005 Document Revised: 11/24/2013 Document Reviewed: 06/24/2008 [facility] Patient Information 2015 [place], [place]. This information is not intended to replace advice given to you by your health care provider. Make sure you discuss any questions you have with your health care provider.

## 2014-07-25 NOTE — ED Provider Notes (Signed)
I saw and evaluated the patient, reviewed the resident's note and I agree with the findings and plan.   .Face to face Exam:  General:  Awake HEENT:  Atraumatic Resp:  Normal effort Abd:  Nondistended Neuro:No focal weakness Lymph: No adenopathy  ekg was discussed and reviewed with the resident  Dot Lanes, MD 07/25/14 2202

## 2014-07-26 ENCOUNTER — Telehealth: Payer: Self-pay | Admitting: Family Medicine

## 2014-07-26 ENCOUNTER — Telehealth: Payer: Self-pay | Admitting: Internal Medicine

## 2014-07-26 DIAGNOSIS — K219 Gastro-esophageal reflux disease without esophagitis: Secondary | ICD-10-CM

## 2014-07-26 DIAGNOSIS — R933 Abnormal findings on diagnostic imaging of other parts of digestive tract: Secondary | ICD-10-CM

## 2014-07-26 DIAGNOSIS — R141 Gas pain: Secondary | ICD-10-CM

## 2014-07-26 DIAGNOSIS — R142 Eructation: Secondary | ICD-10-CM

## 2014-07-26 DIAGNOSIS — R143 Flatulence: Secondary | ICD-10-CM

## 2014-07-26 MED ORDER — OMEPRAZOLE 40 MG PO CPDR: 40.0000 mg | DELAYED_RELEASE_CAPSULE | Freq: Two times a day (BID) | ORAL | Status: DC

## 2014-07-26 NOTE — Telephone Encounter (Signed)
Pt called and would like a referral to see a cardiologist. [name]

## 2014-07-26 NOTE — Telephone Encounter (Signed)
Refill sent as requested. 

## 2014-07-26 NOTE — Telephone Encounter (Signed)
Pt will need appointment first, read the ED note, may not be necessary at this point.  Thanks, Tamela Oddi. Awanda Mink, DO of [facility] 07/26/2014, 10:19 AM

## 2014-07-26 NOTE — Telephone Encounter (Signed)
Please advise.Thank you.[name]  

## 2014-07-28 NOTE — Telephone Encounter (Signed)
Relayed message,patient voiced understanding. [name] S  

## 2014-07-29 ENCOUNTER — Ambulatory Visit (INDEPENDENT_AMBULATORY_CARE_PROVIDER_SITE_OTHER): Payer: Commercial Managed Care - HMO | Admitting: Family Medicine

## 2014-07-29 ENCOUNTER — Encounter: Payer: Self-pay | Admitting: Family Medicine

## 2014-07-29 VITALS — BP 100/60 | HR 86 | Ht 68.0 in | Wt 186.0 lb

## 2014-07-29 DIAGNOSIS — I10 Essential (primary) hypertension: Secondary | ICD-10-CM | POA: Diagnosis not present

## 2014-07-29 DIAGNOSIS — I252 Old myocardial infarction: Secondary | ICD-10-CM | POA: Diagnosis not present

## 2014-07-29 DIAGNOSIS — E785 Hyperlipidemia, unspecified: Secondary | ICD-10-CM | POA: Diagnosis not present

## 2014-07-29 NOTE — Patient Instructions (Signed)
Nice seeing you today Mr. Resende, if you have any further symptoms, please call us.  Thanks, Dr. Awanda Mink
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
## 2014-07-29 NOTE — Assessment & Plan Note (Signed)
Pt repeat pressure slightly increased (100/60) and denies any Sx what so ever including CP/SOB/dizzines/blurred vision/orthostasis/weakness/fatigue.  Recommend increasing hydration and calling if he has any Sx develop.  F/U in three months.

## 2014-07-29 NOTE — Assessment & Plan Note (Signed)
Check Lipid Panel today, ideally needs to increase Lipitor to at least 40 mg daily but does not want to do this at this time.

## 2014-07-29 NOTE — Assessment & Plan Note (Signed)
Pt w/ classic description of reflux Sx, but with his previous cardiac history he may warrant evaluation if continues to have any Sx.  Discussed this with him and recommended exercise stress test vs myoview stress test with referral to cardiology.

## 2014-07-30 LAB — COMPREHENSIVE METABOLIC PANEL
ALBUMIN: 4.1 g/dL (ref 3.5–5.2)
ALK PHOS: 70 U/L (ref 39–117)
ALT: 21 U/L (ref 0–53)
AST: 21 U/L (ref 0–37)
BILIRUBIN TOTAL: 0.5 mg/dL (ref 0.2–1.2)
BUN: 13 mg/dL (ref 6–23)
CO2: 23 mEq/L (ref 19–32)
Calcium: 9.3 mg/dL (ref 8.4–10.5)
Chloride: 107 mEq/L (ref 96–112)
Creat: 0.97 mg/dL (ref 0.50–1.35)
GLUCOSE: 84 mg/dL (ref 70–99)
Potassium: 4.2 mEq/L (ref 3.5–5.3)
Sodium: 141 mEq/L (ref 135–145)
Total Protein: 6.8 g/dL (ref 6.0–8.3)

## 2014-07-30 LAB — LIPID PANEL
CHOL/HDL RATIO: 3.3 ratio
CHOLESTEROL: 170 mg/dL (ref 0–200)
HDL: 51 mg/dL (ref >39)
LDL Cholesterol: 90 mg/dL (ref 0–99)
Triglycerides: 144 mg/dL (ref <150)
VLDL: 29 mg/dL (ref 0–40)

## 2014-08-02 ENCOUNTER — Telehealth: Payer: Self-pay

## 2014-08-02 NOTE — Telephone Encounter (Signed)
Resubmitted prior authorization through Cover My Meds for Omeprazole.  Awaiting response.

## 2014-09-14 ENCOUNTER — Telehealth: Payer: Self-pay | Admitting: Family Medicine

## 2014-09-14 NOTE — Telephone Encounter (Signed)
[facility] told [facility] may be [facility]. His stomach turned into a "football" shape and pain near his belly button region. Wants to know if he needs to be seen here or if we can refer him out

## 2014-09-14 NOTE — Telephone Encounter (Signed)
Pt needs new examination, and we can refer him back to [facility] at that point if appropriate.  Thanks, Tamela Oddi. Awanda Mink, DO of [facility] 09/14/2014, 12:27 PM

## 2014-09-14 NOTE — Telephone Encounter (Signed)
Pt called and would like to speak to Clarise Cruz about a issue he is having. Every time he sneezes he right side hurts and it burns. jw

## 2014-09-14 NOTE — Telephone Encounter (Signed)
Spoke with patient and made appointment on Thursday 09/23/2014 with Dr. Awanda Mink

## 2014-09-17 ENCOUNTER — Encounter: Payer: Self-pay | Admitting: Family Medicine

## 2014-09-17 ENCOUNTER — Ambulatory Visit (INDEPENDENT_AMBULATORY_CARE_PROVIDER_SITE_OTHER): Payer: Commercial Managed Care - HMO | Admitting: Family Medicine

## 2014-09-17 VITALS — BP 158/96 | HR 118 | Temp 98.3°F | Wt 188.0 lb

## 2014-09-17 DIAGNOSIS — K439 Ventral hernia without obstruction or gangrene: Secondary | ICD-10-CM

## 2014-09-17 NOTE — Patient Instructions (Signed)
I have referred you to see a surgeon for your hernia. Someone from our clinic will call you when it has been set up.

## 2014-09-23 ENCOUNTER — Ambulatory Visit: Payer: Commercial Managed Care - HMO | Admitting: Family Medicine

## 2014-09-29 NOTE — Progress Notes (Signed)
   Subjective:    Patient ID: [name], male    DOB: [birth year], 63 y.o.   MRN: [id number]  HPI Pt presents for presumed hernia that he reports was diagnosed at the [place] recently. He complains of several months of abdominal wall pain that is worse with cough or valsalva. He has not tried any treatments and it is somewhat better if he hold the spot with his hand. No n/v/d. History of several abdominal surgeries including appendectomy, cholecystectomy and hiatal hernia repair.   Review of Systems See HPI    Objective:   Physical Exam  Nursing note and vitals reviewed. Constitutional: He is oriented to person, place, and time. He appears well-developed and well-nourished. No distress.  HENT:  Head: Normocephalic and atraumatic.  Eyes: Conjunctivae are normal. Right eye exhibits no discharge. Left eye exhibits no discharge. No scleral icterus.  Cardiovascular: Normal rate.   Pulmonary/Chest: Effort normal.  Abdominal: Soft. Normal appearance and bowel sounds are normal. He exhibits no distension and no mass. There is no hepatosplenomegaly. There is no rebound and no guarding.    Neurological: He is alert and oriented to person, place, and time.  Skin: Skin is warm and dry. No rash noted. He is not diaphoretic. No erythema.  Psychiatric: He has a normal mood and affect. His behavior is normal.          Assessment & Plan:

## 2014-09-29 NOTE — Assessment & Plan Note (Signed)
Possible ventral hernia, maybe spigelian, not able to feel/see on exam but [facility] by [facility] and history is consistent, severe pain per patient - refer for surgical eval

## 2014-10-05 ENCOUNTER — Encounter (INDEPENDENT_AMBULATORY_CARE_PROVIDER_SITE_OTHER): Payer: Self-pay | Admitting: General Surgery

## 2014-10-05 NOTE — Progress Notes (Signed)
Patient ID: [name], male   DOB: 1951-04-21, 63 y.o.   MRN: [id number]  [name] 10/05/2014 1:27 PM Location: [facility] Patient #: [id number] DOB: Jul 28, 1951 Married / Language: [name] / Race: White Male History of Present Illness [name]; 10/05/2014 2:03 PM) Patient words: Intial visit for possible LT sided hernia.  The patient is a 63 year old male    Note:He is referred by Dr. Sherril Cong for further evaluation of a left sided abdominal wall hernia. 2 months ago, he had the onset of some sinus problems with significant coughing and sneezing. Following that, he noted some intermittent sharp pain in the left abdominal wall only with coughing and sneezing. This does not occur with any other activities. He has not noticed a bulge in this area. There is concern that he may have an abdominal wall hernia at the site and so I've been asked to see him. He does have a history of her previous ventral hernia repair with mesh. CT scan of the abdomen and pelvis from March 2015 did not demonstrate a ventral hernia. It did demonstrate a small left inguinal hernia containing fat.  Other Problems Bobby Holts, LPN; 91/03/7828 5:62 PM) Cancer Cerebrovascular Accident Cholelithiasis Chronic Obstructive Lung Disease Gastroesophageal Reflux Disease High blood pressure Hypercholesterolemia Inguinal Hernia Kidney Stone Myocardial infarction Vascular Disease  Past Surgical History Bobby Holts, LPN; 13/0/8657 8:46 PM) Appendectomy Colon Polyp Removal - Colonoscopy Colon Polyp Removal - Open Gallbladder Surgery - Laparoscopic Gallbladder Surgery - Open Knee Surgery Left.  Diagnostic Studies History Bobby Holts, LPN; 96/01/9527 4:13 PM) Colonoscopy 1-5 years ago  Allergies Bobby Holts, LPN; 24/03/101 7:25 PM) Latex Adhesive Tape Codeine and Related  Medication History Bobby Holts, LPN; 36/05/4402 4:74 PM) Albuterol Sulfate  HFA (108 (90 Base)MCG/ACT Aerosol Soln, Inhalation) Active. Aspirin EC (81MG  Tablet DR, Oral) Active. Lipitor (10MG  Tablet, Oral) Active. Vitamin D-3 (1000UNIT Capsule, Oral) Active. Pletal (100MG  Tablet, Oral) Active. Microzide (12.5MG  Capsule, Oral) Active. Imdur (60MG  Tablet ER 24HR, Oral) Active. Lisinopril (20MG  Tablet, Oral) Active. Lopressor (100MG  Tablet, Oral) Active. Nitrostat (0.4MG  Tab Sublingual, Sublingual PRN) Active. PriLOSEC (40MG  Capsule DR, Oral) Active. Spiriva HandiHaler (18MCG Capsule, Inhalation) Active.  Social History Bobby Holts, LPN; 25/08/5637 7:56 PM) Alcohol use Occasional alcohol use. Caffeine use Coffee. Tobacco use Current every day smoker.  Family History Bobby Holts, LPN; 43/01/2950 8:84 PM) Breast Cancer Family Members In General. Heart Disease Mother.     Review of Systems Bobby Holts LPN; 16/05/629 1:60 PM) General Present- Weight Gain. Not Present- Appetite Loss, Chills, Fatigue, Fever, Night Sweats and Weight Loss. HEENT Present- Seasonal Allergies. Not Present- Earache, Hearing Loss, Hoarseness, Nose Bleed, Oral Ulcers, Ringing in the Ears, Sinus Pain, Sore Throat, Visual Disturbances, Wears glasses/contact lenses and Yellow Eyes. Respiratory Present- Snoring. Not Present- Bloody sputum, Chronic Cough, Difficulty Breathing and Wheezing. Cardiovascular Present- Leg Cramps. Not Present- Chest Pain, Difficulty Breathing Lying Down, Palpitations, Rapid Heart Rate, Shortness of Breath and Swelling of Extremities. Gastrointestinal Present- Abdominal Pain. Not Present- Bloating, Bloody Stool, Change in Bowel Habits, Chronic diarrhea, Constipation, Difficulty Swallowing, Excessive gas, Gets full quickly at meals, Hemorrhoids, Indigestion, Nausea, Rectal Pain and Vomiting. Male Genitourinary Not Present- Blood in Urine, Change in Urinary Stream, Frequency, Impotence, Nocturia, Painful Urination, Urgency and Urine  Leakage. Musculoskeletal Not Present- Back Pain, Joint Pain, Joint Stiffness, Muscle Pain, Muscle Weakness and Swelling of Extremities. Neurological Not Present- Decreased Memory, Fainting, Headaches, Numbness, Seizures, Tingling, Tremor, Trouble walking and Weakness. Endocrine Not Present-  Cold Intolerance, Excessive Hunger, Hair Changes, Heat Intolerance, Hot flashes and New Diabetes. Hematology Present- Easy Bruising. Not Present- Excessive bleeding, Gland problems, HIV and Persistent Infections.  Vitals Bobby Holts LPN; 92/01/3006 6:22 PM) 10/05/2014 1:32 PM Weight: 191.38 lb Height: 68in Body Surface Area: 2.04 m Body Mass Index: 29.1 kg/m Pain Level: 6/10 Temp.: 99.5F(Temporal)  Pulse: 84 (Regular)  Resp.: 22 (Unlabored)  BP: 158/98 (Sitting, Left Arm, Standard)     Physical Exam [name]; 10/05/2014 2:01 PM)  The physical exam findings are as follows: Note:General: Overweight male in NAD. Pleasant and cooperative.  ABDOMEN: Soft, obese, nontender, nondistended, no masses, no organomegaly, active bowel sounds, multiple scars, no hernias.  GU: Very small left inguinal bulge with a cough  NEUROLOGIC: Alert and oriented, answers questions appropriately, normal gait and station.  PSYCHIATRIC: Normal mood, affect , and behavior.    Assessment & Plan [name]; 10/05/2014 2:05 PM)  MUSCLE STRAIN (848.9  T14.8) Impression: LLQ abdominal wall muscle strain-no evidence of hernia. He does have a very small, asymptomatic LIH. This will continue to be symptomatic until he controls the cough and sneezing.  Plan: I recommended he contact his PCP for medication for the cough. Once the cough and sneeze are controlled, I suspect the the strain will heal and the pain will improve.  [name]

## 2014-10-12 ENCOUNTER — Telehealth: Payer: Self-pay | Admitting: Internal Medicine

## 2014-10-12 DIAGNOSIS — R143 Flatulence: Secondary | ICD-10-CM

## 2014-10-12 DIAGNOSIS — K219 Gastro-esophageal reflux disease without esophagitis: Secondary | ICD-10-CM

## 2014-10-12 DIAGNOSIS — R142 Eructation: Secondary | ICD-10-CM

## 2014-10-12 DIAGNOSIS — R141 Gas pain: Secondary | ICD-10-CM

## 2014-10-15 MED ORDER — OMEPRAZOLE 40 MG PO CPDR: 40.0000 mg | DELAYED_RELEASE_CAPSULE | Freq: Two times a day (BID) | ORAL | Status: DC

## 2014-10-15 NOTE — Telephone Encounter (Signed)
Sent refill of Omeprazole to [facility]

## 2014-10-19 ENCOUNTER — Telehealth: Payer: Self-pay | Admitting: Internal Medicine

## 2014-10-19 DIAGNOSIS — R141 Gas pain: Secondary | ICD-10-CM

## 2014-10-19 DIAGNOSIS — R143 Flatulence: Secondary | ICD-10-CM

## 2014-10-19 DIAGNOSIS — K219 Gastro-esophageal reflux disease without esophagitis: Secondary | ICD-10-CM

## 2014-10-19 DIAGNOSIS — R142 Eructation: Secondary | ICD-10-CM

## 2014-10-20 NOTE — Telephone Encounter (Signed)
Lm regarding medication question

## 2014-11-18 NOTE — Telephone Encounter (Signed)
Left patient message to call me back regarding questions about his Omeprazole.  At some point, patient was taking Omeprazole twice a day.  A prior authorization for this dose was completed and subsequently denied by [facility].    Further investigation shows that patient is supposed to be taking 1 tablet (40mg ) daily.  Will discuss with this with patient when he returns the call.

## 2014-11-22 MED ORDER — OMEPRAZOLE 40 MG PO CPDR: 40.0000 mg | DELAYED_RELEASE_CAPSULE | Freq: Every day | ORAL | Status: DC

## 2014-11-22 NOTE — Telephone Encounter (Signed)
Had a message from patient on my voice mail;  Attempted to return the call - Lm  Message on vm.

## 2014-11-22 NOTE — Telephone Encounter (Signed)
Spoke with patient and clarified that he had been taking 2 20mg  Omeprazole a day.  I told him that based on the information in his chart he should be taking 1 Omeprazole 40mg  daily.  As patient was completely out of medication, I sent a 30 day supply to [facility] at [facility].  I then sent the correct 90 day supply to [facility].  I told him if I had any problems with his insurance company I would let me know, and for him to let me know if he had problems getting the 30 day supply.  Patient acknowledged and understood.

## 2014-12-13 ENCOUNTER — Ambulatory Visit (INDEPENDENT_AMBULATORY_CARE_PROVIDER_SITE_OTHER): Payer: Commercial Managed Care - HMO | Admitting: Family Medicine

## 2014-12-13 ENCOUNTER — Encounter: Payer: Self-pay | Admitting: Family Medicine

## 2014-12-13 VITALS — BP 148/96 | HR 72 | Temp 98.3°F | Ht 68.0 in | Wt 190.4 lb

## 2014-12-13 DIAGNOSIS — I10 Essential (primary) hypertension: Secondary | ICD-10-CM

## 2014-12-13 DIAGNOSIS — Z87891 Personal history of nicotine dependence: Secondary | ICD-10-CM

## 2014-12-13 MED ORDER — ZOSTER VACCINE LIVE 19400 UNT/0.65ML ~~LOC~~ SOLR
0.6500 mL | Freq: Once | SUBCUTANEOUS | Status: DC
Start: 2014-12-13 — End: 2017-03-05

## 2014-12-13 NOTE — Assessment & Plan Note (Addendum)
Currently smoking about 1/3 ppd due to family stressors - Information given for [place] quit line - Also gave Dr. Graylin Shiver card to call about further techniques to help quit - Will hold his driving form until he no longer is smoking as the reason for his syncope was due to vasovagal 2/2 to cough from cigarette toxins.  Pt understands this and will f/u with me in this in about 3 weeks to see how doing. - Can consider nicotine urine at next visit to evaluate compliance.

## 2014-12-13 NOTE — Patient Instructions (Signed)
Please consider using the [place] quit line at 1-800-quit-now.  As well, you may talk to Dr. Valentina Lucks about quitting options.  You may call the clinic to schedule an appointment with him at any point.  We will see you back in three weeks to see how you are doing.  Thanks, Dr. Awanda Mink

## 2014-12-13 NOTE — Assessment & Plan Note (Signed)
Pt BP slightly elevated on recheck today - Did run out of mediations about 3 weeks ago and started taking again about one week ago.  However, I have a feeling his tobacco use again is contributing to his BP - Information given on [place] tobacco quit line - CMET and Lipid Panel done in August 2015 - Continue current regimen, recheck in 3 months.  If still elevated at that time, can increase medication or addition of another agent.

## 2014-12-13 NOTE — Progress Notes (Signed)
[name] is a 64 y.o. male who presents to clinic for HTN f/u.   HTN - Ran out of medication about 3 weeks ago, got refilled about one week ago now, and has been compliant since then.  He denies any palpitations, edema, cough, CP/SOB, HA, blurred vision.   Tobacco Abuse - Pt states he started smoking again bout 3 weeks ago 2/2 stress from family situation.  He currently is smoking about 1/3 ppd and is interested in quitting.  His motivation to quit is at about a 9/10 level and he is interested in quitting due to daughter having a baby.   PMH reviewed, PSHx reviewed, allergies reviewed, medications reviewed.   History  Substance Use Topics  . Smoking status: Former Smoker -- 1.50 packs/day for 40 years    Quit date: 03/27/2013  . Smokeless tobacco: Never Used     Comment: Quit 01/2013  . Alcohol Use: Yes     Comment: 1 and 1/2 pint every week   ROS as above otherwise neg   Exam:  BP 148/96 mmHg  Pulse 72  Temp(Src) 98.3 F (36.8 C) (Oral)  Ht 5\' 8"  (1.727 m)  Wt 190 lb 6.4 oz (86.365 kg)  BMI 28.96 kg/m2 Gen: Well NAD Cardio: RRR, No M/G/R Pulm: CTAB

## 2014-12-18 ENCOUNTER — Other Ambulatory Visit: Payer: Self-pay | Admitting: Internal Medicine

## 2015-01-03 ENCOUNTER — Ambulatory Visit: Payer: Commercial Managed Care - HMO | Admitting: Family Medicine

## 2015-01-06 ENCOUNTER — Encounter: Payer: Self-pay | Admitting: Family Medicine

## 2015-01-06 ENCOUNTER — Ambulatory Visit (INDEPENDENT_AMBULATORY_CARE_PROVIDER_SITE_OTHER): Payer: Commercial Managed Care - HMO | Admitting: Family Medicine

## 2015-01-06 VITALS — BP 154/93 | HR 112 | Temp 98.4°F | Ht 68.0 in | Wt 181.6 lb

## 2015-01-06 DIAGNOSIS — F1010 Alcohol abuse, uncomplicated: Secondary | ICD-10-CM

## 2015-01-06 DIAGNOSIS — F10129 Alcohol abuse with intoxication, unspecified: Secondary | ICD-10-CM

## 2015-01-06 DIAGNOSIS — F10929 Alcohol use, unspecified with intoxication, unspecified: Secondary | ICD-10-CM

## 2015-01-06 DIAGNOSIS — Z87891 Personal history of nicotine dependence: Secondary | ICD-10-CM

## 2015-01-06 DIAGNOSIS — I10 Essential (primary) hypertension: Secondary | ICD-10-CM

## 2015-01-06 MED ORDER — LISINOPRIL 40 MG PO TABS: 40.0000 mg | ORAL_TABLET | Freq: Every day | ORAL | Status: DC

## 2015-01-06 NOTE — Assessment & Plan Note (Signed)
Denies alcohol use but slight aroma of liquor in the room - Check EtOH level today

## 2015-01-06 NOTE — Assessment & Plan Note (Signed)
Denies smoking and has gone back to E-cigarette - Cleared to drive again today.  Continue to follow closely for compliance with quitting.

## 2015-01-06 NOTE — Assessment & Plan Note (Signed)
BP elevated over 150/90, will try to lower due to previous MI - Increase Lisinopril to 40 mg daily, tolerating well - CMET today  - F/U in 4-6 weeks for recheck and repeat BMET for creatinine.

## 2015-01-06 NOTE — Progress Notes (Signed)
[name] is a 64 y.o. male who presents to clinic for HTN f/u.   HTN - Compliant with medication but still elevated.   He denies any palpitations, edema, cough, CP/SOB, HA, blurred vision.   Tobacco Abuse - Pt states he started smoking for a month period due to stress back in January.  Has not smoked in over about 4 weeks now since last visit.  F/U today to get cleared for DMV.    PMH reviewed, PSHx reviewed, allergies reviewed, medications reviewed.   History  Substance Use Topics  . Smoking status: Former Smoker -- 1.50 packs/day for 40 years    Quit date: 03/27/2013  . Smokeless tobacco: Never Used     Comment: Quit 01/2013  . Alcohol Use: Yes     Comment: 1 and 1/2 pint every week   ROS as above otherwise neg   Exam:  BP 154/93 mmHg  Pulse 112  Temp(Src) 98.4 F (36.9 C) (Oral)  Ht 5\' 8"  (1.727 m)  Wt 181 lb 9.6 oz (82.373 kg)  BMI 27.62 kg/m2 Gen: Well NAD Cardio: RRR, No M/G/R Pulm: CTAB

## 2015-01-06 NOTE — Addendum Note (Signed)
Addended by: Nolon Rod on: 01/06/2015 09:03 AM   Modules accepted: Orders

## 2015-01-06 NOTE — Patient Instructions (Signed)
Please take 40 mg of lisinopril and see [place] back in about 4-6 weeks.  Thanks, Dr. Awanda Mink

## 2015-01-07 LAB — COMPREHENSIVE METABOLIC PANEL
ALK PHOS: 86 U/L (ref 39–117)
ALT: 31 U/L (ref 0–53)
AST: 34 U/L (ref 0–37)
Albumin: 4 g/dL (ref 3.5–5.2)
BUN: 15 mg/dL (ref 6–23)
CO2: 22 mEq/L (ref 19–32)
Calcium: 9.1 mg/dL (ref 8.4–10.5)
Chloride: 102 mEq/L (ref 96–112)
Creat: 1.01 mg/dL (ref 0.50–1.35)
GLUCOSE: 113 mg/dL — AB (ref 70–99)
POTASSIUM: 4.1 meq/L (ref 3.5–5.3)
Sodium: 140 mEq/L (ref 135–145)
TOTAL PROTEIN: 7.2 g/dL (ref 6.0–8.3)
Total Bilirubin: 0.6 mg/dL (ref 0.2–1.2)

## 2015-01-07 LAB — ETHANOL

## 2015-01-10 ENCOUNTER — Telehealth: Payer: Self-pay | Admitting: Family Medicine

## 2015-01-10 NOTE — Telephone Encounter (Signed)
Pt called and said that the pharmacy told him all his medications have to be in 90 day QTY. Please call in verify with the pharmacy. jw

## 2015-01-11 NOTE — Telephone Encounter (Signed)
Please verify which pharmacy and which medications need to be called in and I will be glad to do it.  Thanks, Tamela Oddi. Awanda Mink, DO of [facility] 01/11/2015, 11:42 AM

## 2015-01-15 ENCOUNTER — Other Ambulatory Visit: Payer: Self-pay | Admitting: Internal Medicine

## 2015-01-26 NOTE — Addendum Note (Signed)
Addended by: Kennith Maes R on: 01/26/2015 12:12 PM   Modules accepted: Orders

## 2015-02-04 ENCOUNTER — Encounter: Payer: Self-pay | Admitting: Family Medicine

## 2015-02-04 ENCOUNTER — Ambulatory Visit (INDEPENDENT_AMBULATORY_CARE_PROVIDER_SITE_OTHER): Payer: Commercial Managed Care - HMO | Admitting: Family Medicine

## 2015-02-04 VITALS — BP 156/89 | HR 86 | Temp 98.3°F | Ht 68.0 in | Wt 188.1 lb

## 2015-02-04 DIAGNOSIS — R6889 Other general symptoms and signs: Secondary | ICD-10-CM

## 2015-02-04 DIAGNOSIS — R5381 Other malaise: Secondary | ICD-10-CM

## 2015-02-04 DIAGNOSIS — Z87891 Personal history of nicotine dependence: Secondary | ICD-10-CM

## 2015-02-04 DIAGNOSIS — I10 Essential (primary) hypertension: Secondary | ICD-10-CM

## 2015-02-04 DIAGNOSIS — R5383 Other fatigue: Secondary | ICD-10-CM

## 2015-02-04 LAB — BASIC METABOLIC PANEL
BUN: 36 mg/dL — ABNORMAL HIGH (ref 6–23)
CO2: 25 mEq/L (ref 19–32)
Calcium: 9.8 mg/dL (ref 8.4–10.5)
Chloride: 99 mEq/L (ref 96–112)
Creat: 1.12 mg/dL (ref 0.50–1.35)
Glucose, Bld: 95 mg/dL (ref 70–99)
Potassium: 4.9 mEq/L (ref 3.5–5.3)
SODIUM: 135 meq/L (ref 135–145)

## 2015-02-04 MED ORDER — HYDROCHLOROTHIAZIDE 25 MG PO TABS: 25.0000 mg | ORAL_TABLET | Freq: Every day | ORAL | Status: DC

## 2015-02-04 NOTE — Assessment & Plan Note (Signed)
BP elevated to 156/89 today, chronically elevated - Continue with current regimen and increase HCTZ to 25 mg - May need addition of Aldactone vs Norvasc if not controlled at next visit - Check in 4-6 weeks, consider BMET at that time for creatinine.

## 2015-02-04 NOTE — Progress Notes (Signed)
[name] is a 64 y.o. male who presents to clinic for HTN f/u.   HTN - Compliant with medication but still elevated.   He denies any palpitations, edema, cough, CP/SOB, HA, blurred vision.   Tobacco Abuse - Pt states he started smoking for a month period due to stress back in January.  Has not smoked in over about 8 weeks now.    Cold Intolerance - New onset, over the past 8 weeks.  Never had before, noticing having trouble with cold temps.  Denies fatigue, cough, worsening edema, changes in hair texture, changes in vision, weakness.  Mostly located in distal extremities.  Has not noticed any changes in his neck size, breathing trouble, nodules in the neck.   PMH reviewed, PSHx reviewed, allergies reviewed, medications reviewed.   History  Substance Use Topics  . Smoking status: Former Smoker -- 1.50 packs/day for 40 years    Quit date: 03/27/2013  . Smokeless tobacco: Never Used     Comment: Quit 01/2013  . Alcohol Use: Yes     Comment: 1 and 1/2 pint every week   ROS as above otherwise neg   Exam:  BP 156/89 mmHg  Pulse 86  Temp(Src) 98.3 F (36.8 C) (Oral)  Ht 5\' 8"  (1.727 m)  Wt 188 lb 1.6 oz (85.322 kg)  BMI 28.61 kg/m2  Repeat BP R arm: 148/86  Gen: Well NAD Neck: No thyromegaly or nodules palpated.  Cardio: RRR, No M/G/R Pulm: CTAB

## 2015-02-04 NOTE — Assessment & Plan Note (Signed)
Has not smoked in over 8 weeks now - Continue with e-cigarette until can get off this.

## 2015-02-04 NOTE — Patient Instructions (Signed)
Please increase your hydrochlorothiazide to 25 mg per day.

## 2015-02-04 NOTE — Assessment & Plan Note (Signed)
May just be seasonal or changes with smoking.  However, will check thyroid as etiology.  Another very low likelihood as cause would be Thromboangiitis obliterans vs Raynauds - Obtain TSH - Observe for now, continue with cessation of smoking

## 2015-02-05 LAB — TSH: TSH: 1.475 u[IU]/mL (ref 0.350–4.500)

## 2015-02-18 ENCOUNTER — Telehealth: Payer: Self-pay | Admitting: Family Medicine

## 2015-02-18 NOTE — Telephone Encounter (Signed)
Will forward to MD. Ahuva Poynor,CMA  

## 2015-02-18 NOTE — Telephone Encounter (Signed)
Requesting results from last visit

## 2015-02-20 NOTE — Telephone Encounter (Signed)
Results look good including his kidney function and his thyroid.   please let pt know.  I'll be post call on Monday, otherwise would call him then.   Thanks, Gaspar Bidding

## 2015-02-21 NOTE — Telephone Encounter (Signed)
Spoke with patient and infromed him of below

## 2015-03-10 ENCOUNTER — Telehealth: Payer: Self-pay | Admitting: Family Medicine

## 2015-03-10 DIAGNOSIS — M25562 Pain in left knee: Secondary | ICD-10-CM

## 2015-03-10 NOTE — Telephone Encounter (Signed)
Pt called and needs a referral to his doctor that did his knee surgery. jw

## 2015-03-28 ENCOUNTER — Ambulatory Visit (INDEPENDENT_AMBULATORY_CARE_PROVIDER_SITE_OTHER): Payer: Commercial Managed Care - HMO | Admitting: Family Medicine

## 2015-03-28 ENCOUNTER — Encounter: Payer: Self-pay | Admitting: Family Medicine

## 2015-03-28 VITALS — BP 130/80 | Temp 98.4°F | Ht 68.0 in | Wt 186.6 lb

## 2015-03-28 DIAGNOSIS — K439 Ventral hernia without obstruction or gangrene: Secondary | ICD-10-CM | POA: Diagnosis not present

## 2015-03-28 DIAGNOSIS — L570 Actinic keratosis: Secondary | ICD-10-CM

## 2015-03-28 DIAGNOSIS — I10 Essential (primary) hypertension: Secondary | ICD-10-CM

## 2015-03-28 DIAGNOSIS — R6889 Other general symptoms and signs: Secondary | ICD-10-CM

## 2015-03-28 NOTE — Assessment & Plan Note (Signed)
Well controlled today - If elevated in future, would consider addition of Aldactone vs norvasc - Consider BMET at next visit

## 2015-03-28 NOTE — Assessment & Plan Note (Signed)
Resolving with temperature change and smoking cessation - Could be from his PAD vs climate - If reappears, may need ABI repeats

## 2015-03-28 NOTE — Assessment & Plan Note (Signed)
Pt w/ description of possible inguinal hernia - Previously seen by surgery in November 2015 and recommended f/u - He should not need repeat referral but recommended to call [facility] to schedule f/u appointment or we will put in new referral if needed.  Red flags including severe abdominal pain, recalcitrant hernia to reduction, N/V/D, fever, needs to be seen in the Emergency Room.

## 2015-03-28 NOTE — Progress Notes (Signed)
[name] is a 64 y.o. male who presents to clinic for HTN/cold intolerance/skin lesion f/u.   HTN - Compliant with medication.  He denies any palpitations, edema, cough, CP/SOB, HA, blurred vision.   Tobacco Abuse - Pt states he started smoking for a month period due to stress in January 2016.  Has not smoked in since February 2016 per his report.  Continues to use the e-ciagrette and nicotine gum, which are working.   Cold Intolerance - New onset, since beginning of January 2016.  Never had before, noticed having trouble with cold temps.  Denies fatigue, cough, worsening edema, changes in hair texture, changes in vision, weakness.  Mostly located in distal extremities.  Had not noticed any changes in his neck size, breathing trouble, nodules in the neck.    Visit April 2016 - Improved, denies any new Sx and noticed improvement with smoking cessation.  Hx of Hernia - Pt states that he had a small episode where a bulge in the lower abdomen/groin protruded when mowing the lawn.  He was able to push it back in without complications and denied any fever, chills, nausea, vomiting, abdominal pain.  Happened 2nd week of April and has not had reoccurrence since then.   Skin Lesion - Located on nasal apex/septum region.  Has been ongoing now for about 1 month, no growth, no irregular features, no surrounding erythema or spreading.    PMH reviewed, PSHx reviewed, allergies reviewed, medications reviewed.   History  Substance Use Topics  . Smoking status: Former Smoker -- 1.50 packs/day for 40 years    Quit date: 03/27/2013  . Smokeless tobacco: Never Used     Comment: Quit 01/2013  . Alcohol Use: Yes     Comment: 1 and 1/2 pint every week   ROS as above otherwise neg   Exam:  BP 130/80 mmHg  Temp(Src) 98.4 F (36.9 C) (Oral)  Ht 5\' 8"  (1.727 m)  Wt 186 lb 9.6 oz (84.641 kg)  BMI 28.38 kg/m2   Gen: Well NAD Head: [place]/AT, scaly macule on apex of nose about 1 mm in diameter  Cardio: RRR,  No M/G/R Pulm: CTAB Abdom: NABS, No palpable hernia

## 2015-03-28 NOTE — Assessment & Plan Note (Signed)
Cryotherapy to apex of nose.  Tolerated well, time out performed, SE/ADR explained and pt agreed to procedure.  Cryo gun applied x 3 to area.  F/U PRN and consider bx at next visit if not improved.

## 2015-06-08 ENCOUNTER — Encounter: Payer: Self-pay | Admitting: Internal Medicine

## 2015-08-15 ENCOUNTER — Telehealth: Payer: Self-pay | Admitting: Internal Medicine

## 2015-08-15 DIAGNOSIS — R143 Flatulence: Secondary | ICD-10-CM

## 2015-08-15 DIAGNOSIS — K219 Gastro-esophageal reflux disease without esophagitis: Secondary | ICD-10-CM

## 2015-08-15 DIAGNOSIS — R142 Eructation: Secondary | ICD-10-CM

## 2015-08-15 DIAGNOSIS — R141 Gas pain: Secondary | ICD-10-CM

## 2015-08-15 MED ORDER — OMEPRAZOLE 40 MG PO CPDR: 40.0000 mg | DELAYED_RELEASE_CAPSULE | Freq: Every day | ORAL | Status: DC

## 2015-08-15 NOTE — Telephone Encounter (Signed)
Refilled Omeprazole 

## 2016-02-02 ENCOUNTER — Telehealth (HOSPITAL_COMMUNITY): Payer: Self-pay

## 2016-02-17 ENCOUNTER — Telehealth: Payer: Self-pay | Admitting: Internal Medicine

## 2016-02-17 DIAGNOSIS — R142 Eructation: Secondary | ICD-10-CM

## 2016-02-17 DIAGNOSIS — R141 Gas pain: Secondary | ICD-10-CM

## 2016-02-17 DIAGNOSIS — R143 Flatulence: Secondary | ICD-10-CM

## 2016-02-17 DIAGNOSIS — K219 Gastro-esophageal reflux disease without esophagitis: Secondary | ICD-10-CM

## 2016-02-20 ENCOUNTER — Encounter (HOSPITAL_COMMUNITY): Payer: Self-pay

## 2016-02-20 ENCOUNTER — Encounter (HOSPITAL_COMMUNITY)
Admission: RE | Admit: 2016-02-20 | Discharge: 2016-02-20 | Disposition: A | Discharge status: Home / Self Care | Payer: Medicare Other | Source: Ambulatory Visit | Attending: Pulmonary Disease | Admitting: Pulmonary Disease

## 2016-02-20 VITALS — BP 156/80 | HR 77 | Resp 16 | Ht 68.0 in | Wt 190.9 lb

## 2016-02-20 DIAGNOSIS — J441 Chronic obstructive pulmonary disease with (acute) exacerbation: Secondary | ICD-10-CM

## 2016-02-20 MED ORDER — OMEPRAZOLE 40 MG PO CPDR: 40.0000 mg | DELAYED_RELEASE_CAPSULE | Freq: Every day | ORAL | Status: DC

## 2016-02-20 NOTE — Telephone Encounter (Signed)
Refilled omeprazole  90 day supply to [facility]
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
## 2016-02-21 ENCOUNTER — Encounter (HOSPITAL_COMMUNITY)
Admission: RE | Admit: 2016-02-21 | Discharge: 2016-02-21 | Disposition: A | Discharge status: Home / Self Care | Payer: Medicare Other | Source: Ambulatory Visit | Attending: Pulmonary Disease | Admitting: Pulmonary Disease

## 2016-02-23 ENCOUNTER — Encounter (HOSPITAL_COMMUNITY): Payer: Medicare Other

## 2016-02-27 ENCOUNTER — Telehealth (HOSPITAL_COMMUNITY): Payer: Self-pay | Admitting: *Deleted

## 2016-02-28 ENCOUNTER — Encounter (HOSPITAL_COMMUNITY): Admission: RE | Admit: 2016-02-28 | Payer: Medicare Other | Source: Ambulatory Visit

## 2016-03-01 ENCOUNTER — Encounter (HOSPITAL_COMMUNITY): Payer: Medicare Other

## 2016-03-06 ENCOUNTER — Encounter (HOSPITAL_COMMUNITY): Payer: Medicare Other

## 2016-03-08 ENCOUNTER — Encounter (HOSPITAL_COMMUNITY): Payer: Medicare Other

## 2016-03-13 ENCOUNTER — Encounter (HOSPITAL_COMMUNITY): Payer: Medicare Other

## 2016-03-15 ENCOUNTER — Encounter (HOSPITAL_COMMUNITY): Payer: Medicare Other

## 2016-03-20 ENCOUNTER — Encounter (HOSPITAL_COMMUNITY): Payer: Medicare Other

## 2016-03-22 ENCOUNTER — Encounter (HOSPITAL_COMMUNITY)
Admission: RE | Admit: 2016-03-22 | Discharge: 2016-03-22 | Disposition: A | Discharge status: Home / Self Care | Payer: Medicare Other | Source: Ambulatory Visit | Attending: Pulmonary Disease | Admitting: Pulmonary Disease

## 2016-03-22 ENCOUNTER — Telehealth (HOSPITAL_COMMUNITY): Payer: Self-pay | Admitting: *Deleted

## 2016-03-22 DIAGNOSIS — Z029 Encounter for administrative examinations, unspecified: Secondary | ICD-10-CM | POA: Insufficient documentation

## 2016-03-27 ENCOUNTER — Encounter (HOSPITAL_COMMUNITY)
Admission: RE | Admit: 2016-03-27 | Discharge: 2016-03-27 | Disposition: A | Discharge status: Home / Self Care | Payer: Medicare Other | Source: Ambulatory Visit | Attending: Pulmonary Disease | Admitting: Pulmonary Disease

## 2016-03-27 ENCOUNTER — Telehealth (HOSPITAL_COMMUNITY): Payer: Self-pay | Admitting: *Deleted

## 2016-03-27 VITALS — Wt 189.2 lb

## 2016-03-27 DIAGNOSIS — Z029 Encounter for administrative examinations, unspecified: Secondary | ICD-10-CM | POA: Diagnosis present

## 2016-03-27 DIAGNOSIS — J441 Chronic obstructive pulmonary disease with (acute) exacerbation: Secondary | ICD-10-CM

## 2016-03-27 NOTE — Progress Notes (Signed)
Daily Session Note  Patient Details  Name: [name] MRN: [id number] Date of Birth: 1951-01-11 Referring Provider:    Encounter Date: 03/27/2016  Check In:     Session Check In - 03/27/16 1030    Check-In   Location [facility]   Staff Present Rosebud Poles, RN, BSN;Molly diVincenzo, MS, ACSM RCEP, Exercise Physiologist;Lisa Ysidro Evert, RN;Lillianna Sabel Rollene Rotunda, RN, BSN;Joann Rion, RN, BSN   Supervising physician immediately available to respond to emergencies [facility] immediately available   Physician(s) Dr. Marily Memos   Medication changes reported     No   Fall or balance concerns reported    No   Warm-up and Cool-down Performed as group-led instruction   Resistance Training Performed Yes   VAD Patient? No   Pain Assessment   Currently in Pain? No/denies   Multiple Pain Sites No      Capillary Blood Glucose: No results found for this or any previous visit (from the past 24 hour(s)).      Exercise Prescription Changes - 03/27/16 1200    Response to Exercise   Blood Pressure (Admit) 118/70 mmHg   Blood Pressure (Exercise) 122/70 mmHg   Blood Pressure (Exit) 124/70 mmHg   Heart Rate (Admit) 82 bpm   Heart Rate (Exercise) 89 bpm   Heart Rate (Exit) 86 bpm   Oxygen Saturation (Admit) 95 %   Oxygen Saturation (Exercise) 94 %   Oxygen Saturation (Exit) 92 %   Rating of Perceived Exertion (Exercise) 13   Perceived Dyspnea (Exercise) 3   Duration Progress to 45 minutes of aerobic exercise without signs/symptoms of physical distress   Intensity Other (comment)  40-80% HRR   Resistance Training   Training Prescription Yes   Weight orange bands   Reps 10-12   Oxygen   Oxygen --  room air   Bike   Level 0.5   Minutes 15   NuStep   Level 3   Minutes 15   METs 1.5   Track   Laps 9   Minutes 15     Goals Met:  Exercise tolerated well Queuing for purse lip breathing No report of cardiac concerns or symptoms Strength training completed  today  Goals Unmet:  Not Applicable  Comments: Service time is from 1030 to 1205   Dr. Rush Farmer is Medical Director for Pulmonary Rehab at [facility].

## 2016-03-29 ENCOUNTER — Encounter (HOSPITAL_COMMUNITY): Payer: Medicare Other

## 2016-04-03 ENCOUNTER — Encounter (HOSPITAL_COMMUNITY)
Admission: RE | Admit: 2016-04-03 | Discharge: 2016-04-03 | Disposition: A | Discharge status: Home / Self Care | Payer: Medicare Other | Source: Ambulatory Visit | Attending: Pulmonary Disease | Admitting: Pulmonary Disease

## 2016-04-03 VITALS — Wt 192.0 lb

## 2016-04-03 DIAGNOSIS — Z029 Encounter for administrative examinations, unspecified: Secondary | ICD-10-CM | POA: Insufficient documentation

## 2016-04-03 DIAGNOSIS — J441 Chronic obstructive pulmonary disease with (acute) exacerbation: Secondary | ICD-10-CM

## 2016-04-03 NOTE — Progress Notes (Signed)
Daily Session Note  Patient Details  Name: [name] MRN: [id number] Date of Birth: 1951-06-12 Referring Provider:    Encounter Date: 04/03/2016  Check In:     Session Check In - 04/03/16 1030    Check-In   Location [facility]   Staff Present Trish Fountain, RN, Deland Pretty, MS, ACSM CEP, Exercise Physiologist;Molly diVincenzo, MS, ACSM RCEP, Exercise Physiologist;Joan Leonia Reeves, RN, BSN   Supervising physician immediately available to respond to emergencies [facility] immediately available   Physician(s) Dr. Marily Memos   Medication changes reported     No   Fall or balance concerns reported    No   Warm-up and Cool-down Performed as group-led instruction   Resistance Training Performed Yes   VAD Patient? No   Pain Assessment   Currently in Pain? Yes   Pain Score 6    Pain Location Groin   Pain Orientation Right   Pain Descriptors / Indicators Tender   Pain Type Acute pain   Pain Onset More than a month ago   Pain Frequency Intermittent   Aggravating Factors  activity and coughing   Pain Relieving Factors pushing hernia back in   Multiple Pain Sites No      Capillary Blood Glucose: No results found for this or any previous visit (from the past 24 hour(s)).      Exercise Prescription Changes - 04/03/16 1212    Response to Exercise   Blood Pressure (Admit) 132/80 mmHg   Blood Pressure (Exercise) 136/84 mmHg   Blood Pressure (Exit) 126/80 mmHg   Heart Rate (Admit) 82 bpm   Heart Rate (Exercise) 96 bpm   Heart Rate (Exit) 87 bpm   Oxygen Saturation (Admit) 94 %   Oxygen Saturation (Exercise) 95 %   Oxygen Saturation (Exit) 93 %   Rating of Perceived Exertion (Exercise) 15   Perceived Dyspnea (Exercise) 1   Duration Progress to 45 minutes of aerobic exercise without signs/symptoms of physical distress   Intensity Other (comment)  40-80% HRR   Resistance Training   Training Prescription Yes   Weight orange bands   Reps 10-12   Oxygen   Oxygen --   Bike   Level 0.5   Minutes 15   NuStep   Level 3   Minutes 15   METs 1.9   Track   Laps 12   Minutes 15     Goals Met:  Exercise tolerated well Queuing for purse lip breathing No report of cardiac concerns or symptoms Strength training completed today  Goals Unmet:  Not Applicable  Comments: Service time is from 1030 to 1210   Dr. Rush Farmer is Medical Director for Pulmonary Rehab at [facility].

## 2016-04-05 ENCOUNTER — Encounter (HOSPITAL_COMMUNITY): Admission: RE | Admit: 2016-04-05 | Payer: Medicare Other | Source: Ambulatory Visit

## 2016-04-10 ENCOUNTER — Encounter (HOSPITAL_COMMUNITY)
Admission: RE | Admit: 2016-04-10 | Discharge: 2016-04-10 | Disposition: A | Discharge status: Home / Self Care | Payer: Medicare Other | Source: Ambulatory Visit | Attending: Pulmonary Disease | Admitting: Pulmonary Disease

## 2016-04-10 VITALS — Wt 188.7 lb

## 2016-04-10 DIAGNOSIS — J441 Chronic obstructive pulmonary disease with (acute) exacerbation: Secondary | ICD-10-CM

## 2016-04-10 DIAGNOSIS — Z029 Encounter for administrative examinations, unspecified: Secondary | ICD-10-CM | POA: Diagnosis not present

## 2016-04-10 NOTE — Progress Notes (Signed)
Daily Session Note  Patient Details  Name: [name] MRN: [id number] Date of Birth: [birth year] Referring Provider:    Encounter Date: 04/10/2016  Check In:     Session Check In - 04/10/16 1030    Check-In   Location [facility]   Staff Present Rosebud Poles, RN, BSN;Molly diVincenzo, MS, ACSM RCEP, Exercise Physiologist;Lisa Ysidro Evert, Felipe Drone, RN, MHA;Kent Riendeau Rollene Rotunda, RN, BSN   Supervising physician immediately available to respond to emergencies [facility] immediately available   Physician(s) Dr. Marily Memos   Medication changes reported     No   Fall or balance concerns reported    No   Warm-up and Cool-down Performed as group-led instruction   Resistance Training Performed Yes   VAD Patient? No   Pain Assessment   Currently in Pain? No/denies   Multiple Pain Sites No      Capillary Blood Glucose: No results found for this or any previous visit (from the past 24 hour(s)).      Exercise Prescription Changes - 04/10/16 1525    Exercise Review   Progression Yes   Response to Exercise   Blood Pressure (Admit) 130/66 mmHg   Blood Pressure (Exercise) 108/76 mmHg   Blood Pressure (Exit) 118/60 mmHg   Heart Rate (Admit) 78 bpm   Heart Rate (Exercise) 87 bpm   Heart Rate (Exit) 83 bpm   Oxygen Saturation (Admit) 96 %   Oxygen Saturation (Exercise) 91 %   Oxygen Saturation (Exit) 95 %   Rating of Perceived Exertion (Exercise) 13   Perceived Dyspnea (Exercise) 1   Duration Progress to 45 minutes of aerobic exercise without signs/symptoms of physical distress   Intensity THRR unchanged   Progression   Progression Continue to progress workloads to maintain intensity without signs/symptoms of physical distress.   Resistance Training   Training Prescription Yes   Weight orange bands   Reps 10-12   Bike   Level 1.5   Minutes 15   NuStep   Level 3   Minutes 15   METs 1.7   Track   Laps 9   Minutes 15     Goals Met:  Exercise  tolerated well Queuing for purse lip breathing No report of cardiac concerns or symptoms Strength training completed today  Goals Unmet:  Not Applicable  Comments: Service time is from 1030 to 1215   Dr. Rush Farmer is Medical Director for Pulmonary Rehab at [facility].

## 2016-04-12 ENCOUNTER — Encounter (HOSPITAL_COMMUNITY): Payer: Medicare Other

## 2016-04-17 ENCOUNTER — Encounter (HOSPITAL_COMMUNITY)
Admission: RE | Admit: 2016-04-17 | Discharge: 2016-04-17 | Disposition: A | Discharge status: Home / Self Care | Payer: Medicare Other | Source: Ambulatory Visit | Attending: Pulmonary Disease | Admitting: Pulmonary Disease

## 2016-04-19 ENCOUNTER — Telehealth (HOSPITAL_COMMUNITY): Payer: Self-pay | Admitting: *Deleted

## 2016-04-19 ENCOUNTER — Encounter (HOSPITAL_COMMUNITY)
Admission: RE | Admit: 2016-04-19 | Discharge: 2016-04-19 | Disposition: A | Discharge status: Home / Self Care | Payer: Medicare Other | Source: Ambulatory Visit | Attending: Pulmonary Disease | Admitting: Pulmonary Disease

## 2016-04-19 ENCOUNTER — Encounter (HOSPITAL_COMMUNITY): Payer: Self-pay
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
## 2016-04-24 ENCOUNTER — Encounter (HOSPITAL_COMMUNITY)
Admission: RE | Admit: 2016-04-24 | Discharge: 2016-04-24 | Disposition: A | Discharge status: Home / Self Care | Payer: Medicare Other | Source: Ambulatory Visit | Attending: Pulmonary Disease | Admitting: Pulmonary Disease

## 2016-04-24 VITALS — Wt 191.4 lb

## 2016-04-24 DIAGNOSIS — J441 Chronic obstructive pulmonary disease with (acute) exacerbation: Secondary | ICD-10-CM

## 2016-04-24 DIAGNOSIS — Z029 Encounter for administrative examinations, unspecified: Secondary | ICD-10-CM | POA: Diagnosis not present

## 2016-04-24 NOTE — Progress Notes (Signed)
Daily Session Note  Patient Details  Name: [name] MRN: [id number] Date of Birth: 22-Sep-1951 Referring Provider:    Encounter Date: 04/24/2016  Check In:     Session Check In - 04/24/16 1217    Check-In   Location [facility]   Staff Present Su Hilt, MS, ACSM RCEP, Exercise Physiologist;Lanson Randle Ysidro Evert, RN;Portia Rollene Rotunda, RN, BSN   Supervising physician immediately available to respond to emergencies [facility] immediately available   Physician(s) Dr. Marily Memos   Medication changes reported     Yes   Fall or balance concerns reported    No   Warm-up and Cool-down Performed as group-led instruction   Resistance Training Performed Yes   VAD Patient? No   Pain Assessment   Currently in Pain? No/denies   Multiple Pain Sites No      Capillary Blood Glucose: No results found for this or any previous visit (from the past 24 hour(s)).      Exercise Prescription Changes - 04/24/16 1200    Exercise Review   Progression Yes   Response to Exercise   Blood Pressure (Admit) 128/96 mmHg   Blood Pressure (Exercise) 168/74 mmHg   Blood Pressure (Exit) 118/60 mmHg   Heart Rate (Admit) 73 bpm   Heart Rate (Exercise) 89 bpm   Heart Rate (Exit) 90 bpm   Oxygen Saturation (Admit) 96 %   Oxygen Saturation (Exercise) 92 %   Oxygen Saturation (Exit) 97 %   Rating of Perceived Exertion (Exercise) 13   Perceived Dyspnea (Exercise) 2   Duration Progress to 45 minutes of aerobic exercise without signs/symptoms of physical distress   Intensity THRR unchanged   Progression   Progression Continue to progress workloads to maintain intensity without signs/symptoms of physical distress.   Resistance Training   Training Prescription Yes   Weight orange bands   Reps 10-12   Recumbant Bike   Level 3   Minutes 15   NuStep   Level 5   Minutes 15   METs 1.8   Track   Laps 12   Minutes 15     Goals Met:  Exercise tolerated well No report of cardiac concerns  or symptoms Strength training completed today  Goals Unmet:  Not Applicable  Comments: Service time is from 1030 to 1210    Dr. Rush Farmer is Medical Director for Pulmonary Rehab at [facility].

## 2016-04-26 ENCOUNTER — Encounter (HOSPITAL_COMMUNITY): Payer: Medicare Other

## 2016-05-01 ENCOUNTER — Encounter (HOSPITAL_COMMUNITY)
Admission: RE | Admit: 2016-05-01 | Discharge: 2016-05-01 | Disposition: A | Discharge status: Home / Self Care | Payer: Medicare Other | Source: Ambulatory Visit | Attending: Pulmonary Disease | Admitting: Pulmonary Disease

## 2016-05-01 VITALS — Wt 192.9 lb

## 2016-05-01 DIAGNOSIS — J441 Chronic obstructive pulmonary disease with (acute) exacerbation: Secondary | ICD-10-CM

## 2016-05-01 DIAGNOSIS — Z029 Encounter for administrative examinations, unspecified: Secondary | ICD-10-CM | POA: Diagnosis not present

## 2016-05-01 NOTE — Progress Notes (Signed)
Daily Session Note  Patient Details  Name: [name] MRN: [id number] Date of Birth: [birth year] Referring Provider:    Encounter Date: 05/01/2016  Check In:     Session Check In - 05/01/16 1020    Check-In   [facility]   Staff Present Rosebud Poles, RN, BSN;Alyla Pietila, MS, ACSM RCEP, Exercise Physiologist;Annedrea Rosezella [place], RN, MHA;Portia Rollene Rotunda, RN, BSN   Supervising physician immediately available to respond to emergencies [facility] immediately available   Physician(s) Dr. Marily Memos   Medication changes reported     No   Fall or balance concerns reported    No   Warm-up and Cool-down Performed as group-led instruction   Resistance Training Performed Yes   VAD Patient? No   Pain Assessment   Currently in Pain? No/denies   Multiple Pain Sites No      Capillary Blood Glucose: No results found for this or any previous visit (from the past 24 hour(s)).      Exercise Prescription Changes - 05/01/16 1200    Response to Exercise   Blood Pressure (Admit) 140/86 mmHg   Blood Pressure (Exercise) 152/90 mmHg   Blood Pressure (Exit) 118/58 mmHg   Heart Rate (Admit) 82 bpm   Heart Rate (Exercise) 92 bpm   Heart Rate (Exit) 84 bpm   Oxygen Saturation (Admit) 93 %   Oxygen Saturation (Exercise) 95 %   Oxygen Saturation (Exit) 96 %   Rating of Perceived Exertion (Exercise) 11   Perceived Dyspnea (Exercise) 1   Duration Progress to 45 minutes of aerobic exercise without signs/symptoms of physical distress   Intensity THRR unchanged   Progression   Progression Continue to progress workloads to maintain intensity without signs/symptoms of physical distress.   Resistance Training   Training Prescription Yes   Weight orange bands   Reps 10-12   Recumbant Bike   Level 3   Minutes 15   NuStep   Level 5   Minutes 15   METs 2   Track   Laps 14   Minutes 15     Goals Met:  Exercise tolerated well No report of cardiac concerns or  symptoms Strength training completed today  Goals Unmet:  Not Applicable  Comments: Service time is from 10:30am to 12:05pm    Dr. Rush Farmer is Medical Director for Pulmonary Rehab at [facility].

## 2016-05-01 NOTE — Progress Notes (Signed)
Nutrition Note Spoke with pt. Pt's Rate Your Plate nutrition survey completed with pt. Will review results with pt at a later date. Continue client-centered nutrition education by RD as part of interdisciplinary care.  Monitor and evaluate progress toward nutrition goal with team.  Derek Mound, M.Ed, RD, LDN, CDE 05/01/2016 12:07 PM

## 2016-05-03 ENCOUNTER — Encounter (HOSPITAL_COMMUNITY)
Admission: RE | Admit: 2016-05-03 | Discharge: 2016-05-03 | Disposition: A | Discharge status: Home / Self Care | Payer: Medicare Other | Source: Ambulatory Visit | Attending: Pulmonary Disease | Admitting: Pulmonary Disease

## 2016-05-03 VITALS — Wt 191.4 lb

## 2016-05-03 DIAGNOSIS — Z87891 Personal history of nicotine dependence: Secondary | ICD-10-CM | POA: Insufficient documentation

## 2016-05-03 DIAGNOSIS — Z7982 Long term (current) use of aspirin: Secondary | ICD-10-CM | POA: Diagnosis not present

## 2016-05-03 DIAGNOSIS — J441 Chronic obstructive pulmonary disease with (acute) exacerbation: Secondary | ICD-10-CM

## 2016-05-03 DIAGNOSIS — Z79899 Other long term (current) drug therapy: Secondary | ICD-10-CM | POA: Insufficient documentation

## 2016-05-03 DIAGNOSIS — Z8673 Personal history of transient ischemic attack (TIA), and cerebral infarction without residual deficits: Secondary | ICD-10-CM | POA: Insufficient documentation

## 2016-05-03 DIAGNOSIS — E785 Hyperlipidemia, unspecified: Secondary | ICD-10-CM | POA: Insufficient documentation

## 2016-05-03 DIAGNOSIS — I252 Old myocardial infarction: Secondary | ICD-10-CM | POA: Diagnosis not present

## 2016-05-03 DIAGNOSIS — I10 Essential (primary) hypertension: Secondary | ICD-10-CM | POA: Diagnosis not present

## 2016-05-03 NOTE — Progress Notes (Deleted)
Incomplete Session Note  Patient Details  Name: [name] MRN: [id number] Date of Birth: 1951-06-01 Referring Provider:    [name] did not complete his rehab session. After education he only exercised on one piece of equipment. He left early due to his wife's MD appointment.

## 2016-05-03 NOTE — Progress Notes (Signed)
Daily Session Note  Patient Details  Name: [name] MRN: [id number] Date of Birth: 11-Dec-1950 Referring Provider:    Encounter Date: 05/03/2016  Check In:     Session Check In - 05/03/16 1034    Check-In   [facility]   Staff Present Rosebud Poles, RN, BSN;Lisa Ysidro Evert, RN;Carson Meche Rollene Rotunda, RN, BSN;Ramon Dredge, RN, MHA;Molly diVincenzo, MS, ACSM RCEP, Exercise Physiologist   Supervising physician immediately available to respond to emergencies [facility] immediately available   Physician(s) Dr. Renaee Munda   Medication changes reported     No   Fall or balance concerns reported    No   Warm-up and Cool-down Performed as group-led instruction   Resistance Training Performed Yes   VAD Patient? No   Pain Assessment   Currently in Pain? No/denies   Multiple Pain Sites No      Capillary Blood Glucose: No results found for this or any previous visit (from the past 24 hour(s)).      Exercise Prescription Changes - 05/03/16 1251    Response to Exercise   Blood Pressure (Admit) 122/86 mmHg   Blood Pressure (Exercise) 128/70 mmHg   Blood Pressure (Exit) 128/70 mmHg   Heart Rate (Admit) 78 bpm   Heart Rate (Exercise) 88 bpm   Heart Rate (Exit) 88 bpm   Oxygen Saturation (Admit) 97 %   Oxygen Saturation (Exercise) 97 %   Oxygen Saturation (Exit) 97 %   Rating of Perceived Exertion (Exercise) 10   Perceived Dyspnea (Exercise) 0   Duration Progress to 45 minutes of aerobic exercise without signs/symptoms of physical distress   Intensity THRR unchanged   Progression   Progression Continue to progress workloads to maintain intensity without signs/symptoms of physical distress.   Resistance Training   Training Prescription Yes   Weight orange bands   Reps 10-12   Recumbant Bike   Level 2   Minutes 15   NuStep   Level --   Minutes --   METs --   Track   Laps --   Minutes --     Goals Met:  Exercise tolerated well Strength training  completed today  Goals Unmet:  Not Applicable  Comments: Service time is from 1030 to 1200. Don left early in order to make his wife's MD appointment.   Dr. Rush Farmer is Medical Director for Pulmonary Rehab at [facility].

## 2016-05-08 ENCOUNTER — Encounter (HOSPITAL_COMMUNITY)
Admission: RE | Admit: 2016-05-08 | Discharge: 2016-05-08 | Disposition: A | Discharge status: Home / Self Care | Payer: Medicare Other | Source: Ambulatory Visit | Attending: Pulmonary Disease | Admitting: Pulmonary Disease

## 2016-05-08 VITALS — Wt 191.4 lb

## 2016-05-08 DIAGNOSIS — I252 Old myocardial infarction: Secondary | ICD-10-CM | POA: Diagnosis not present

## 2016-05-08 DIAGNOSIS — I10 Essential (primary) hypertension: Secondary | ICD-10-CM | POA: Diagnosis not present

## 2016-05-08 DIAGNOSIS — Z79899 Other long term (current) drug therapy: Secondary | ICD-10-CM | POA: Diagnosis not present

## 2016-05-08 DIAGNOSIS — E785 Hyperlipidemia, unspecified: Secondary | ICD-10-CM | POA: Diagnosis not present

## 2016-05-08 DIAGNOSIS — Z7982 Long term (current) use of aspirin: Secondary | ICD-10-CM | POA: Diagnosis not present

## 2016-05-08 DIAGNOSIS — J441 Chronic obstructive pulmonary disease with (acute) exacerbation: Secondary | ICD-10-CM | POA: Diagnosis not present

## 2016-05-08 DIAGNOSIS — Z87891 Personal history of nicotine dependence: Secondary | ICD-10-CM | POA: Diagnosis not present

## 2016-05-08 DIAGNOSIS — Z8673 Personal history of transient ischemic attack (TIA), and cerebral infarction without residual deficits: Secondary | ICD-10-CM | POA: Diagnosis not present

## 2016-05-08 NOTE — Progress Notes (Signed)
Daily Session Note  Patient Details  Name: [name] MRN: [id number] Date of Birth: 17-Jul-1951 Referring Provider:    Encounter Date: 05/08/2016  Check In:     Session Check In - 05/08/16 1024    Check-In   [facility]   Staff Present Rosebud Poles, RN, BSN;Taran Hable Ysidro Evert, RN;Portia Rollene Rotunda, RN, BSN;Ramon Dredge, RN, MHA;Molly diVincenzo, MS, ACSM RCEP, Exercise Physiologist   Supervising physician immediately available to respond to emergencies [facility] immediately available   Physician(s) Dr. Marily Memos   Medication changes reported     No   Fall or balance concerns reported    No   Warm-up and Cool-down Performed as group-led instruction   Resistance Training Performed Yes   VAD Patient? No   Pain Assessment   Currently in Pain? No/denies   Multiple Pain Sites No      Capillary Blood Glucose: No results found for this or any previous visit (from the past 24 hour(s)).      Exercise Prescription Changes - 05/08/16 1200    Response to Exercise   Blood Pressure (Admit) 152/90 mmHg   Blood Pressure (Exercise) 150/78 mmHg   Blood Pressure (Exit) 102/64 mmHg   Heart Rate (Admit) 88 bpm   Heart Rate (Exercise) 100 bpm   Heart Rate (Exit) 91 bpm   Oxygen Saturation (Admit) 94 %   Oxygen Saturation (Exercise) 93 %   Oxygen Saturation (Exit) 93 %   Rating of Perceived Exertion (Exercise) 11   Perceived Dyspnea (Exercise) 0   Duration Progress to 45 minutes of aerobic exercise without signs/symptoms of physical distress   Intensity THRR unchanged   Progression   Progression Continue to progress workloads to maintain intensity without signs/symptoms of physical distress.   Resistance Training   Training Prescription Yes   Weight orange bands   Reps 10-12   Recumbant Bike   Level 3   Minutes 15   NuStep   Level 5   Minutes 15   METs 1.8   Track   Laps 15   Minutes 15     Goals Met:  Exercise tolerated well No report of  cardiac concerns or symptoms Strength training completed today  Goals Unmet:  Not Applicable  Comments: Service time is from 1030 to 1200    Dr. Rush Farmer is Medical Director for Pulmonary Rehab at [facility].

## 2016-05-10 ENCOUNTER — Encounter (HOSPITAL_COMMUNITY): Payer: Medicare Other

## 2016-05-15 ENCOUNTER — Encounter (HOSPITAL_COMMUNITY)
Admission: RE | Admit: 2016-05-15 | Discharge: 2016-05-15 | Disposition: A | Discharge status: Home / Self Care | Payer: Medicare Other | Source: Ambulatory Visit | Attending: Pulmonary Disease | Admitting: Pulmonary Disease

## 2016-05-15 VITALS — Wt 190.5 lb

## 2016-05-15 DIAGNOSIS — Z87891 Personal history of nicotine dependence: Secondary | ICD-10-CM | POA: Diagnosis not present

## 2016-05-15 DIAGNOSIS — I10 Essential (primary) hypertension: Secondary | ICD-10-CM | POA: Diagnosis not present

## 2016-05-15 DIAGNOSIS — E785 Hyperlipidemia, unspecified: Secondary | ICD-10-CM | POA: Diagnosis not present

## 2016-05-15 DIAGNOSIS — Z79899 Other long term (current) drug therapy: Secondary | ICD-10-CM | POA: Diagnosis not present

## 2016-05-15 DIAGNOSIS — J441 Chronic obstructive pulmonary disease with (acute) exacerbation: Secondary | ICD-10-CM

## 2016-05-15 DIAGNOSIS — Z7982 Long term (current) use of aspirin: Secondary | ICD-10-CM | POA: Diagnosis not present

## 2016-05-15 DIAGNOSIS — Z8673 Personal history of transient ischemic attack (TIA), and cerebral infarction without residual deficits: Secondary | ICD-10-CM | POA: Diagnosis not present

## 2016-05-15 DIAGNOSIS — I252 Old myocardial infarction: Secondary | ICD-10-CM | POA: Diagnosis not present

## 2016-05-15 NOTE — Progress Notes (Signed)
Incomplete Session Note  Patient Details  Name: [name] MRN: [id number] Date of Birth: May 27, 1951 Referring Provider:    [name] did not complete his rehab session.  He stated he did not feel well due to lack of sleep over the last several days. He stated he has some financial worries that he was taking care of. He agreed to not exercise, return home to rest and return Thursday to pulmonary rehab for his bi-weekly exercise session.

## 2016-05-17 ENCOUNTER — Encounter (HOSPITAL_COMMUNITY)
Admission: RE | Admit: 2016-05-17 | Discharge: 2016-05-17 | Disposition: A | Discharge status: Home / Self Care | Payer: Medicare Other | Source: Ambulatory Visit | Attending: Pulmonary Disease | Admitting: Pulmonary Disease

## 2016-05-17 NOTE — Progress Notes (Signed)
Don just stopped by the cardiac and pulmonary rehab department to inform RN he would not be returning to pulmonary rehab. He stated he has personal problems that need his attention at this time and he is unable to continue to focus on rehab. He was encouraged to continue his home exercise and to continue to focus on smoking cessation. [facility]. Encouraged [name] to contact department for readmission once he is able to fully focus on pulmonary rehab.

## 2016-05-17 NOTE — Progress Notes (Signed)
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 meet any of his goals. He states he has personal matters that he needs to focus on at this time and he is unable to committ to his exercise sessions. He has decided to drop from the pulmonary rehab program.   Expected Outcomes weight loss, quit smoking, purse lip breathing, sleeping better at night weight loss, quit smoking, purse lip breathing, sleeping better at night         Core Components/Risk Factors/Patient Goals at Discharge (Final Review):      Goals and Risk Factor Review - 05/17/16 0742    Core Components/Risk Factors/Patient Goals Review   Review Patient did not meet any of his goals. He states he has personal matters that he needs to focus on at this time and he is unable to committ to his exercise sessions. He has decided to drop from the  pulmonary rehab program.      ITP Comments:   Comments: Patient has attended only 8 exercise sessions since his first session 03/27/16. He has decided to drop pulmonary rehab at this time.

## 2016-05-22 ENCOUNTER — Encounter (HOSPITAL_COMMUNITY): Payer: Medicare Other

## 2016-05-24 ENCOUNTER — Encounter (HOSPITAL_COMMUNITY): Payer: Medicare Other

## 2016-05-29 ENCOUNTER — Encounter (HOSPITAL_COMMUNITY): Payer: Medicare Other

## 2016-05-31 ENCOUNTER — Encounter (HOSPITAL_COMMUNITY): Payer: Medicare Other

## 2016-06-05 ENCOUNTER — Encounter (HOSPITAL_COMMUNITY): Payer: Medicare Other

## 2016-06-07 ENCOUNTER — Encounter (HOSPITAL_COMMUNITY): Payer: Medicare Other

## 2016-06-11 ENCOUNTER — Encounter (HOSPITAL_COMMUNITY): Payer: Self-pay

## 2016-06-11 DIAGNOSIS — J441 Chronic obstructive pulmonary disease with (acute) exacerbation: Secondary | ICD-10-CM

## 2016-06-11 NOTE — Progress Notes (Signed)
Discharge Summary  Patient Details  Name: [name] MRN: [id number] Date of Birth: 24-Sep-1951 Referring Provider:     Number of Visits: 7  Reason for Discharge:  Early Exit:  Personal  Smoking History:  History  Smoking status  . Former Smoker -- 1.50 packs/day for 40 years  . Quit date: 03/27/2013  Smokeless tobacco  . Never Used    Comment: Quit 01/2013    Diagnosis:  COPD exacerbation ([place])  ADL UCSD: Pre score 59   Initial Exercise Prescription: See epic flow sheet  Discharge Exercise Prescription (Final Exercise Prescription Changes):     Exercise Prescription Changes - 05/15/16 1200    Response to Exercise   Blood Pressure (Admit) 144/70 mmHg   Heart Rate (Admit) 84 bpm   Oxygen Saturation (Admit) 95 %      Functional Capacity:   Psychological, QOL, Others - Outcomes: PHQ 2/9: Depression screen [facility] 2/9 02/20/2016 03/28/2015 02/04/2015 01/06/2015 12/13/2014  Decreased Interest 0 0 0 0 0  Down, Depressed, Hopeless 2 0 0 0 0  PHQ - 2 Score 2 0 0 0 0  Altered sleeping 3 - - - -  Tired, decreased energy 3 - - - -  Change in appetite 3 - - - -  Feeling bad or failure about yourself  0 - - - -  Trouble concentrating 0 - - - -  Moving slowly or fidgety/restless 1 - - - -  Suicidal thoughts 0 - - - -  PHQ-9 Score 12 - - - -  Difficult doing work/chores Not difficult at all - - - -    Quality of Life: Pre score 25.30  Personal Goals: Goals established at orientation with interventions provided to work toward goal.    Personal Goals Discharge:     Goals and Risk Factor Review      04/17/16 0732 04/17/16 0734 04/19/16 0951 05/17/16 0742     Core Components/Risk Factors/Patient Goals Review   Personal Goals Review Weight Management/Obesity;Tobacco Cessation;Improve shortness of breath with ADL's;Develop more efficient breathing techniques such as purse lipped breathing and diaphragmatic breathing and practicing self-pacing with activity.;Stress        Review Smoking cessation techniques, stress counseling, weight loss, improved breathing techniques --  patient states he has significantly cut back on smoking and is down to 3 cigarettes daily, one with each meal. patient continues to worry about his health. He has an inguinal hernia that limits his ability to exercise. He is in the process of obtaining a truss from the [place] and hopes it will help during exercise. Patient did not meet any of his goals. He states he has personal matters that he needs to focus on at this time and he is unable to committ to his exercise sessions. He has decided to drop from the pulmonary rehab program.    Expected Outcomes weight loss, quit smoking, purse lip breathing, sleeping better at night          Nutrition & Weight - Outcomes:    Nutrition:   Nutrition Discharge:   Education Questionnaire Score:     Knowledge Questionnaire Score - 06/11/16 1209    Knowledge Questionnaire Score   Pre Score 5/13      Patient did not meet their personal goals.

## 2016-06-11 NOTE — Progress Notes (Deleted)
Discharge Summary  Patient Details  Name: RAQUEL VANSON MRN: [id number] Date of Birth: May 18, 1951 Referring Provider:     Number of Visits: 7  Reason for Discharge:  Early Exit:  Personal  Smoking History:  History  Smoking status  . Former Smoker -- 1.50 packs/day for 40 years  . Quit date: 03/27/2013  Smokeless tobacco  . Never Used    Comment: Quit 01/2013    Diagnosis:  COPD exacerbation ([place])  ADL UCSD:   Initial Exercise Prescription:   Discharge Exercise Prescription (Final Exercise Prescription Changes):     Exercise Prescription Changes - 05/15/16 1200    Response to Exercise   Blood Pressure (Admit) 144/70 mmHg   Heart Rate (Admit) 84 bpm   Oxygen Saturation (Admit) 95 %      Functional Capacity:   Psychological, QOL, Others - Outcomes: PHQ 2/9: Depression screen [facility] 2/9 02/20/2016 03/28/2015 02/04/2015 01/06/2015 12/13/2014  Decreased Interest 0 0 0 0 0  Down, Depressed, Hopeless 2 0 0 0 0  PHQ - 2 Score 2 0 0 0 0  Altered sleeping 3 - - - -  Tired, decreased energy 3 - - - -  Change in appetite 3 - - - -  Feeling bad or failure about yourself  0 - - - -  Trouble concentrating 0 - - - -  Moving slowly or fidgety/restless 1 - - - -  Suicidal thoughts 0 - - - -  PHQ-9 Score 12 - - - -  Difficult doing work/chores Not difficult at all - - - -    Quality of Life:   Personal Goals: Goals established at orientation with interventions provided to work toward goal.    Personal Goals Discharge:     Goals and Risk Factor Review      04/17/16 0732 04/17/16 0734 04/19/16 0951 05/17/16 0742     Core Components/Risk Factors/Patient Goals Review   Personal Goals Review Weight Management/Obesity;Tobacco Cessation;Improve shortness of breath with ADL's;Develop more efficient breathing techniques such as purse lipped breathing and diaphragmatic breathing and practicing self-pacing with activity.;Stress       Review Smoking cessation techniques, stress  counseling, weight loss, improved breathing techniques --  patient states he has significantly cut back on smoking and is down to 3 cigarettes daily, one with each meal. patient continues to worry about his health. He has an inguinal hernia that limits his ability to exercise. He is in the process of obtaining a truss from the [place] and hopes it will help during exercise. Patient did not meet any of his goals. He states he has personal matters that he needs to focus on at this time and he is unable to committ to his exercise sessions. He has decided to drop from the pulmonary rehab program.    Expected Outcomes weight loss, quit smoking, purse lip breathing, sleeping better at night          Nutrition & Weight - Outcomes:    Nutrition:   Nutrition Discharge:   Education Questionnaire Score:  Patient did not turn in his admission homework. He had several medical setbacks prior to his first exercise session and during his 7 visits. It was decided that Timmothy Sours would be discharged and readmitted once he was able to commit to regular attendance. Timmothy Sours continues to smoke, and is not ready to commit to a life style change in order to improve his overall health.

## 2016-06-12 ENCOUNTER — Encounter (HOSPITAL_COMMUNITY): Payer: Medicare Other

## 2016-06-14 ENCOUNTER — Encounter (HOSPITAL_COMMUNITY): Payer: Medicare Other

## 2016-06-19 ENCOUNTER — Encounter (HOSPITAL_COMMUNITY): Payer: Medicare Other

## 2016-06-21 ENCOUNTER — Encounter (HOSPITAL_COMMUNITY): Payer: Medicare Other

## 2016-06-26 ENCOUNTER — Encounter (HOSPITAL_COMMUNITY): Payer: Medicare Other

## 2016-06-28 ENCOUNTER — Encounter (HOSPITAL_COMMUNITY): Payer: Medicare Other

## 2016-07-03 ENCOUNTER — Encounter (HOSPITAL_COMMUNITY): Payer: Medicare Other

## 2016-07-05 ENCOUNTER — Encounter (HOSPITAL_COMMUNITY): Payer: Medicare Other

## 2016-07-10 ENCOUNTER — Encounter (HOSPITAL_COMMUNITY): Payer: Medicare Other

## 2016-09-14 ENCOUNTER — Encounter (HOSPITAL_COMMUNITY): Payer: Self-pay

## 2016-09-14 ENCOUNTER — Emergency Department (HOSPITAL_COMMUNITY)
Admission: EM | Admit: 2016-09-14 | Discharge: 2016-09-14 | Disposition: A | Discharge status: Home / Self Care | Payer: Medicare Other | Attending: Emergency Medicine | Admitting: Emergency Medicine

## 2016-09-14 ENCOUNTER — Emergency Department (HOSPITAL_COMMUNITY): Payer: Medicare Other

## 2016-09-14 DIAGNOSIS — I10 Essential (primary) hypertension: Secondary | ICD-10-CM | POA: Insufficient documentation

## 2016-09-14 DIAGNOSIS — Z85828 Personal history of other malignant neoplasm of skin: Secondary | ICD-10-CM | POA: Diagnosis not present

## 2016-09-14 DIAGNOSIS — I252 Old myocardial infarction: Secondary | ICD-10-CM | POA: Diagnosis not present

## 2016-09-14 DIAGNOSIS — J449 Chronic obstructive pulmonary disease, unspecified: Secondary | ICD-10-CM | POA: Insufficient documentation

## 2016-09-14 DIAGNOSIS — Z87891 Personal history of nicotine dependence: Secondary | ICD-10-CM | POA: Insufficient documentation

## 2016-09-14 DIAGNOSIS — M7551 Bursitis of right shoulder: Secondary | ICD-10-CM | POA: Insufficient documentation

## 2016-09-14 DIAGNOSIS — Z7982 Long term (current) use of aspirin: Secondary | ICD-10-CM | POA: Insufficient documentation

## 2016-09-14 DIAGNOSIS — Z91040 Latex allergy status: Secondary | ICD-10-CM | POA: Insufficient documentation

## 2016-09-14 DIAGNOSIS — Z8673 Personal history of transient ischemic attack (TIA), and cerebral infarction without residual deficits: Secondary | ICD-10-CM | POA: Insufficient documentation

## 2016-09-14 DIAGNOSIS — M25511 Pain in right shoulder: Secondary | ICD-10-CM | POA: Diagnosis not present

## 2016-09-14 MED ORDER — DICLOFENAC SODIUM 1 % TD GEL: 4.0000 g | Freq: Four times a day (QID) | TRANSDERMAL | 0 refills | Status: DC

## 2016-09-14 MED ORDER — FENTANYL CITRATE (PF) 100 MCG/2ML IJ SOLN
50.0000 ug | INTRAMUSCULAR | Status: DC | PRN
  Administered 2016-09-14: 50 ug via NASAL

## 2016-09-14 MED ORDER — FENTANYL CITRATE (PF) 100 MCG/2ML IJ SOLN
INTRAMUSCULAR | Status: AC
  Filled 2016-09-14: qty 2

## 2016-09-14 NOTE — ED Notes (Signed)
Patient refusing to undress and get into gown.

## 2016-09-14 NOTE — ED Triage Notes (Signed)
Per Pt, Pt started to have right shoulder pain that started two days ago in the right shoulder that radiates down to the right elbow. Pt reports getting worse of the last two days. Unable to take BP med this morning. Attempted to take Tylenol with no relief

## 2016-09-14 NOTE — ED Notes (Signed)
Pt denies concerns with dc 
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
## 2016-09-14 NOTE — Discharge Instructions (Signed)
Please take the Voltaren gel (topical anti-inflammatory) as instructed, and keep you scheduled appointment at the [facility] next week to discuss your shoulder pain and bursitis with your primary care provider.

## 2016-11-05 ENCOUNTER — Other Ambulatory Visit: Payer: Self-pay | Admitting: Internal Medicine

## 2016-11-05 DIAGNOSIS — R141 Gas pain: Secondary | ICD-10-CM

## 2016-11-05 DIAGNOSIS — K219 Gastro-esophageal reflux disease without esophagitis: Secondary | ICD-10-CM

## 2016-11-05 DIAGNOSIS — R143 Flatulence: Secondary | ICD-10-CM

## 2016-11-05 DIAGNOSIS — R142 Eructation: Secondary | ICD-10-CM

## 2016-12-07 ENCOUNTER — Encounter: Payer: Self-pay | Admitting: Internal Medicine

## 2016-12-07 ENCOUNTER — Ambulatory Visit (INDEPENDENT_AMBULATORY_CARE_PROVIDER_SITE_OTHER): Payer: Medicare PPO | Admitting: Internal Medicine

## 2016-12-07 VITALS — BP 142/100 | HR 85 | Temp 97.9°F | Ht 68.0 in | Wt 187.8 lb

## 2016-12-07 DIAGNOSIS — K148 Other diseases of tongue: Secondary | ICD-10-CM | POA: Diagnosis not present

## 2016-12-07 DIAGNOSIS — K1379 Other lesions of oral mucosa: Secondary | ICD-10-CM | POA: Diagnosis not present

## 2016-12-07 MED ORDER — LIDOCAINE VISCOUS 2 % MT SOLN: 20.0000 mL | OROMUCOSAL | 0 refills | Status: DC | PRN

## 2016-12-07 MED ORDER — DIPHENHYDRAMINE HCL 12.5 MG/5ML PO LIQD: 12.5000 mg | Freq: Four times a day (QID) | ORAL | 0 refills | Status: DC | PRN

## 2016-12-07 MED ORDER — ALUM & MAG HYDROXIDE-SIMETH 200-200-20 MG/5ML PO SUSP: 15.0000 mL | Freq: Four times a day (QID) | ORAL | 0 refills | Status: DC | PRN
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
## 2016-12-07 NOTE — Patient Instructions (Signed)
Mr. Gottschall,  I am concerned about your tongue lesion. Please request a biopsy at the [place] next week.  Use a combination of mylanta, viscous lidocaine, and benadryl and apply to your tongue where you have pain. Otherwise, continue oragel with kenalog (new prescription) as a swish and swallow treatment.  Best, Dr. Ola Spurr

## 2016-12-08 ENCOUNTER — Encounter: Payer: Self-pay | Admitting: Internal Medicine

## 2016-12-08 DIAGNOSIS — K148 Other diseases of tongue: Secondary | ICD-10-CM | POA: Insufficient documentation

## 2016-12-08 NOTE — Assessment & Plan Note (Addendum)
-   Area concerning due to jagged appearance and patient's long history of tobacco abuse - Precepted with Dr. McDiarmid who recommended biopsy of lesion through the [place] to better characterize the tissue and rule out cancer - Prescribed viscous lidocaine, liquid mylanta, and liquid benadryl and counseled patient to mix these in equal portions to use to coat his tongue ulcer to ease discomfort - Planned to prescribe oragel with kenalog to reduce inflammation but patient's pharmacy did not have this in stock. Instead prescribed kenalog dental paste and recommended continued use of oragel mouthwash. - If biopsy is performed and results as normal, could also perform HIV and B12 testing, though patient is followed by Oncologist at [facility] with frequent labs

## 2016-12-10 ENCOUNTER — Telehealth: Payer: Self-pay | Admitting: Internal Medicine

## 2016-12-10 NOTE — Telephone Encounter (Signed)
Patient had asked for call back today. He reports pain at bottom of tongue a little better but area of tip is developing a "white spot." He wonders if it could be a boil. He has been unable to pick up numbing medications due to cost but has an appointment at the [facility] tomorrow, 1/9. He says he will call clinic if he needs referral for biopsy outside of the [place] system.

## 2017-01-28 HISTORY — PX: SKIN GRAFT SPLIT THICKNESS LEG / FOOT: SUR1303

## 2017-01-28 HISTORY — PX: FREE FLAP RADIAL FOREARM: SHX1678

## 2017-01-28 HISTORY — PX: NECK DISSECTION: SUR422

## 2017-01-28 HISTORY — PX: PARTIAL GLOSSECTOMY: SHX2173

## 2017-02-13 ENCOUNTER — Telehealth: Payer: Self-pay | Admitting: *Deleted

## 2017-02-13 NOTE — Telephone Encounter (Signed)
Oncology Nurse Navigator Documentation  Placed introductory call to new referral patient [name], spoke with his wife.  Introduced myself as the H&N oncology nurse navigator that works with Drs. Isidore Moos and Alvy Bimler to whom he has been referred by Dr. Hendricks Limes, [facility], for post-surgical chemo/RT.  She confirmed understanding of referrals, understands [facility] is obtaining pre-authorizations from [facility] for [place] and [employer].   I briefly explained my role as their navigator, indicated that I would be joining them during yet-to-be scheduled appts.   She noted that they had seen Dr. Hendricks Limes earlier today, that he was ordering swallowing evaluation.  She mentioned also that PEG tube placement needs to be scheduled.  I indicated I would communicate to [place] to request VA pre-authorization for both procedures at [facility] hospital.  I provided my contact information, encouraged them to call with questions/concerns as they move forward with treatments at [facility].  Gayleen Orem, RN, BSN, [place]ck Oncology Nurse [place] at [place] [phone]

## 2017-02-18 ENCOUNTER — Encounter: Payer: Self-pay | Admitting: Radiation Oncology

## 2017-02-19 ENCOUNTER — Encounter: Payer: Self-pay | Admitting: Radiation Oncology

## 2017-02-19 NOTE — Progress Notes (Addendum)
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   Patient presented months ago with symptoms of: Dr. Hendricks Limes at [facility] documents on 01/10/17: He states that a few weeks ago he noted an ulcer on his tongue. He was then seen at the [facility] and underwent an incisional biopsy of the tongue lesion that came back as SCC. He then underwent CT neck and chest. He is scheduled to get a PET scan at the [facility]. Reports that the ulcer is very painful and he has not been able to eat much.   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 Invasive squamous cell carcinoma shows greatest depth of invasion of 1.5 cm. Margins uninvolved by invasive tumor (invasive squamous cell carcinoma comes to within 0.6 cm of the inked posterior margin). Negative for lymphovascular invasion Negative for perineural invasion. AJCC Pathologic Stage: pT3 pN3 pM- not applicable. B.ANTERIOR DORSAL TONGUE, BIOPSY: Negative for malignancy. C.POSTERIOR DORSAL TONGUE, BIOPSY: Negative for malignancy. D.MID DORSAL TONGUE, BIOPSY: Negative for malignancy. E.DEEP TONGUE, BIOPSY: Negative for malignancy. F.ANTERIOR FLOOR OF MOUTH, BIOPSY: Negative for malignancy. G.POSTERIOR FLOOR OF MOUTH, BIOPSY: Negative for malignancy. H.LEVEL IA, RESECTION: Six benign lymph nodes negative for metastatic carcinoma on routine H&E staining (0/6). I.LEVEL IB, RESECTION: Two benign lymph nodes negative for metastatic carcinoma on routine H&E staining (0/2). Benign salivary  gland tissue. J.LEFT LEVEL IIA , 3 AND 4, RESECTION: One of twenty-one lymph nodes positive for metastatic carcinoma (0.9 cm focus) with very focal extranodal extension on routine H&E staining (1/21). K.LEVEL IIB, RESECTION: Four benign lymph nodes negative for metastatic carcinoma on routine H&E staining (0/4). One small focus of benign salivary gland tissue. L.RIGHT LEVEL IB, RESECTION: Three benign lymph nodes negative for metastatic carcinoma on routine H&E staining (0/3). Benign salivary gland tissue. M.RIGHT LEVEL 2A, 3, 4, RESECTION: Two of fifteen lymph nodes positive for metastatic carcinoma (2/15).   Nutrition Status Yes No Comments  Weight changes? [x]  []  Has los 40 lbs since January 2018  Swallowing concerns? [x]  []  Can swallow small amounts of liquid.  He is waiting for a swallow study.  He currently has a NG tube and is taking in 2 milkshakes per day, 3 Nutrien 240 mls each and 1 protien shake per day plus water.  PEG? []  [x]  Waiting to be scheduled.   Referrals Yes No Comments  Social Work? []  [x]    Dentistry? []  [x]  Has had all teeth removed before surgery.  Swallowing therapy? []  [x]    Nutrition? [x]  []  Saw at [facility]  Med/Onc? [x]  []  03/05/17 with Dr. Alvy Bimler.  Will have chemotherapy in [place].   Safety Issues Yes No Comments  Prior radiation? []  [x]    Pacemaker/ICD? []  [x]    Possible current pregnancy? []  [x]    Is the patient on methotrexate? []  [x]     Tobacco/Marijuana/Snuff/ETOH use: He stopped smoking in [place]. Did smoke 1.5 packs/daily for 40 years. He stopped drinking in January.  He did drank 1 1/2 pints of alcohol weekly and has been drinking since he was 66 years old..   Past/Anticipated interventions by otolaryngology, if any:  01/28/17 Dr. Hendricks Limes Procedures/Surgeries performed:  LARYNGOSCOPY DIRECT SELECTIVE NECK DISSECTION FREE FLAP RADIAL FOREARM PR  SPLIT [place] <100 SQCM [15100] (SPLIT THICKNESS SKIN GRAFT LEG)     Past/Anticipated interventions by medical oncology, if  any: chemotherapy in [place].   Current Complaints / other details: Patient is here with his wife who had radiation with Dr. Truddie Coco 10 years ago.  He has an incision on his left arm that he said is infected and draining.  He is using bacitracin and taking Keflex. 01/15/17 PET ?VA    BP (!) 142/92 (BP Location: Right Arm, Patient Position: Sitting)   Pulse 100   Temp 98 F (36.7 C) (Axillary)   Ht 5\' 8"  (1.727 m)   Wt 163 lb 6.4 oz (74.1 kg)   SpO2 96%   BMI 24.84 kg/m    Wt Readings from Last 3 Encounters:  02/22/17 163 lb 6.4 oz (74.1 kg)  12/07/16 187 lb 12.8 oz (85.2 kg)  09/14/16 194 lb 6.4 oz (88.2 kg)

## 2017-02-21 ENCOUNTER — Ambulatory Visit
Admission: RE | Admit: 2017-02-21 | Discharge: 2017-02-21 | Disposition: A | Discharge status: Home / Self Care | Payer: Medicare PPO | Source: Ambulatory Visit | Attending: Radiation Oncology | Admitting: Radiation Oncology

## 2017-02-21 ENCOUNTER — Other Ambulatory Visit: Payer: Self-pay | Admitting: Radiation Oncology

## 2017-02-21 ENCOUNTER — Telehealth: Payer: Self-pay | Admitting: *Deleted

## 2017-02-21 ENCOUNTER — Ambulatory Visit
Admission: RE | Admit: 2017-02-21 | Discharge: 2017-02-21 | Disposition: A | Discharge status: Home / Self Care | Payer: Self-pay | Source: Ambulatory Visit | Attending: Radiation Oncology | Admitting: Radiation Oncology

## 2017-02-21 DIAGNOSIS — C801 Malignant (primary) neoplasm, unspecified: Secondary | ICD-10-CM

## 2017-02-21 NOTE — Telephone Encounter (Signed)
Oncology Nurse Navigator Documentation  Spoke with patient wife, confirmed his availability for 4/3 2:00 pm appt with Dr. Alvy Bimler.  Gayleen Orem, RN, BSN, [place] Neck Oncology Nurse [place] at [place] [phone]

## 2017-02-22 ENCOUNTER — Ambulatory Visit
Admission: RE | Admit: 2017-02-22 | Discharge: 2017-02-22 | Disposition: A | Discharge status: Home / Self Care | Payer: Medicare PPO | Source: Ambulatory Visit | Attending: Radiation Oncology | Admitting: Radiation Oncology

## 2017-02-22 ENCOUNTER — Encounter: Payer: Self-pay | Admitting: Radiation Oncology

## 2017-02-22 ENCOUNTER — Encounter: Payer: Self-pay | Admitting: *Deleted

## 2017-02-22 VITALS — BP 142/92 | HR 100 | Temp 98.0°F | Ht 68.0 in | Wt 163.4 lb

## 2017-02-22 DIAGNOSIS — Z79899 Other long term (current) drug therapy: Secondary | ICD-10-CM | POA: Diagnosis not present

## 2017-02-22 DIAGNOSIS — Z510 Encounter for antineoplastic radiation therapy: Secondary | ICD-10-CM | POA: Diagnosis not present

## 2017-02-22 DIAGNOSIS — C021 Malignant neoplasm of border of tongue: Secondary | ICD-10-CM | POA: Diagnosis not present

## 2017-02-22 DIAGNOSIS — K148 Other diseases of tongue: Secondary | ICD-10-CM | POA: Diagnosis not present

## 2017-02-22 DIAGNOSIS — Z87891 Personal history of nicotine dependence: Secondary | ICD-10-CM | POA: Insufficient documentation

## 2017-02-22 DIAGNOSIS — Z8673 Personal history of transient ischemic attack (TIA), and cerebral infarction without residual deficits: Secondary | ICD-10-CM | POA: Diagnosis not present

## 2017-02-22 DIAGNOSIS — Z9889 Other specified postprocedural states: Secondary | ICD-10-CM | POA: Diagnosis not present

## 2017-02-22 DIAGNOSIS — J449 Chronic obstructive pulmonary disease, unspecified: Secondary | ICD-10-CM | POA: Insufficient documentation

## 2017-02-22 DIAGNOSIS — Z9049 Acquired absence of other specified parts of digestive tract: Secondary | ICD-10-CM | POA: Diagnosis not present

## 2017-02-22 DIAGNOSIS — Z8589 Personal history of malignant neoplasm of other organs and systems: Secondary | ICD-10-CM | POA: Insufficient documentation

## 2017-02-22 DIAGNOSIS — Z888 Allergy status to other drugs, medicaments and biological substances status: Secondary | ICD-10-CM | POA: Diagnosis not present

## 2017-02-22 DIAGNOSIS — I739 Peripheral vascular disease, unspecified: Secondary | ICD-10-CM | POA: Diagnosis not present

## 2017-02-22 DIAGNOSIS — Z885 Allergy status to narcotic agent status: Secondary | ICD-10-CM | POA: Insufficient documentation

## 2017-02-22 DIAGNOSIS — I10 Essential (primary) hypertension: Secondary | ICD-10-CM | POA: Insufficient documentation

## 2017-02-22 DIAGNOSIS — Z7982 Long term (current) use of aspirin: Secondary | ICD-10-CM | POA: Insufficient documentation

## 2017-02-22 HISTORY — DX: Malignant neoplasm of tongue, unspecified: C02.9

## 2017-02-22 NOTE — Progress Notes (Signed)
Radiation Oncology         [phone] ________________________________  Initial Outpatient Consultation  Name: [name] MRN: [id number]  Date: 02/22/2017  DOB: [birth year]  [id number] Carlean Jews Rosalyn Gess, MD  Reva Bores, MD   REFERRING PHYSICIAN: Reva Bores, MD  DIAGNOSIS:    ICD-9-CM ICD-10-CM   1. Tongue lesion 529.8 K14.8 Ambulatory referral to Social Work    STAGE IVB pT3 pN3 cM0 Moderate to poorly differentiated squamous cell carcinoma of the left oral tongue, margins negative by 0.6 cm, negative for PNI and LVSI , depth of invasion 1.5 cm, 3 of 51 lymph nodes positive, positive for focal extranodal extension in 1 node  CHIEF COMPLAINT: Here to discuss management of left tongue cancer  HISTORY OF PRESENT ILLNESS::[name] is a 66 y.o. male who presented to his PCP, Dr. Olene [name], on 12/07/16 with complaints of a new onset ulcer under his tongue. This caused him significant discomfort, resulting in decreased food intake.  Subsequently the patient underwent incisional biopsy of the tongue lesion at the [place] which revealed squamous cell carcinoma. Additionally, he underwent neck and chest CT at the [place].  He underwent a PET scan on 01/15/17 preoperatively. This revealed hypermetabolic activity in the left anterior tongue as well as within a small left level 2 node and small nodules in the left parotid gland. No evidence of metastatic disease distantly. CT of the neck on 12/28/16 had demonstrate no frank adenopathy in the neck and the tongue was not well visualized die to dental work. On the same day a CT of his chest showed emphysema of the lungs and a few nonspecific pulmonary nodules which were unchanged compared to 06/2015.  The patient saw Dr. Hendricks [name] at [facility] for consultation on 01/10/17, and underwent surgery on 01/28/17. This was in the form of left partial glossectomy and bilateral selective neck dissections. He underwent left radial forearm flap reconstruction and he did have  dental extractions at the time of his surgery.  Pathology from surgery revealed pT3pN3 cM0 moderate to poorly differentiated squamous cell carcinoma of the left oral tongue, margins negative by 0.6 cm, negative for PNI and LVSI, depth of invasion 1.5 cm, 3 of 51 lymph nodes positive, positive for focal extranodal extension in 1 node.   On review of systems, the patient has lost 40 lbs since January 2018. He reports he can swallow small amounts of liquid. He currently has a nasogastric tube and is taking in 2 milkshakes per day, as well as nutrition shakes and water. The patient is waiting to be scheduled for a swallowing study. The patient denies any bowel or bladder habit changes, however his wife reports he has decreased frequency of bowel movements recently. The patient reports he will consult with Dr. Alvy [name] on 03/05/17. He plans to receive chemotherapy in [place] at this time due to rules at the [facility] but is worried about the commute.  Of note, the patient stopped smoking in January 2018. He had a 40 ppd smoking history. Additionally, the patient quit drinking alcohol in January 2018.  We are joined today by Bobby Orem, RN, Head and Neck Nurse Navigator.  PREVIOUS RADIATION THERAPY: No  PAST MEDICAL HISTORY:  has a past medical history of Allergy; Arthritis; Brain tumor ([place]); Cancer [facility]); COPD (chronic obstructive pulmonary disease) ([place]); Diverticulosis; Heart murmur; HH (hiatus hernia); Hyperlipidemia; Hypertension; Myocardial infarction; PAD (peripheral artery disease) ([place]); Stroke [facility]); Syncope; TIA (transient ischemic attack); Tongue cancer ([place]); and Tubular adenoma of colon.  PAST SURGICAL HISTORY: Past Surgical History:  Procedure Laterality Date  . APPENDECTOMY    . BRAIN SURGERY     3-4 months old  . CHOLECYSTECTOMY    . FREE FLAP RADIAL FOREARM  01/28/2017  . HERNIA REPAIR     X2  . NECK DISSECTION  01/28/2017  . ORBITAL FRACTURE SURGERY Left   . PARTIAL GLOSSECTOMY   01/28/2017   TONGUE, LEFT PARTIAL GLOSSECTOMY at [facility].  . SKIN GRAFT SPLIT THICKNESS LEG / FOOT  01/28/2017   PR SPLIT GRFT TRUNK,ARM,LEG <100 SQCM [15100] (SPLIT THICKNESS SKIN GRAFT LEG)   . TRACHEOSTOMY    . TRACHEOSTOMY CLOSURE      FAMILY HISTORY: family history is not on file.  SOCIAL HISTORY:  reports that he quit smoking about 7 weeks ago. He has a 60.00 pack-year smoking history. He has never used smokeless tobacco. He reports that he does not drink alcohol or use drugs.  ALLERGIES: Latex; Adhesive [tape]; and Codeine  MEDICATIONS:  Current Outpatient Prescriptions  Medication Sig Dispense Refill  . aspirin 81 MG chewable tablet Chew 81 mg by mouth every morning.     Marland Kitchen atorvastatin (LIPITOR) 10 MG tablet Take 5 mg by mouth daily.     . bacitracin 500 UNIT/GM ointment Apply topically.    . cephALEXin (KEFLEX) 500 MG capsule Take 500 mg by mouth.    . chlorthalidone (HYGROTON) 25 MG tablet Take 25 mg by mouth daily.    Marland Kitchen albuterol (PROVENTIL HFA;VENTOLIN HFA) 108 (90 BASE) MCG/ACT inhaler Inhale 2 puffs into the lungs every 4 (four) hours as needed for wheezing or shortness of breath. (Patient not taking: Reported on 02/22/2017) 1 Inhaler 1  . alum & mag hydroxide-simeth (MAALOX/MYLANTA) 200-200-20 MG/5ML suspension Take 15 mLs by mouth every 6 (six) hours as needed for indigestion or heartburn. (Patient not taking: Reported on 02/22/2017) 355 mL 0  . Cholecalciferol (VITAMIN D-3) 1000 UNITS CAPS Take 1 capsule by mouth daily. Reported on 02/21/2016    . cilostazol (PLETAL) 100 MG tablet Take 100 mg by mouth 2 (two) times daily. Reported on 02/21/2016    . cyanocobalamin 500 MCG tablet Take 500 mcg by mouth every morning. Reported on 02/21/2016    . diclofenac sodium (VOLTAREN) 1 % GEL Apply 4 g topically 4 (four) times daily. (Patient not taking: Reported on 02/22/2017) 100 g 0  . diphenhydrAMINE (BENADRYL) 12.5 MG/5ML liquid Take 5 mLs (12.5 mg total) by mouth every 6 (six)  hours as needed. Apply to painful areas of mouth as part of mixture. Do not ingest. (Patient not taking: Reported on 02/22/2017) 118 mL 0  . ferrous sulfate 325 (65 FE) MG EC tablet Take 325 mg by mouth daily with breakfast.    . ferrous sulfate 325 (65 FE) MG tablet Take 325 mg by mouth 2 (two) times daily with a meal.    . hydrochlorothiazide (HYDRODIURIL) 25 MG tablet Take 1 tablet (25 mg total) by mouth daily. (Patient not taking: Reported on 09/14/2016) 90 tablet 3  . isosorbide dinitrate (ISORDIL) 10 MG tablet Take 10 mg by mouth 2 (two) times daily. Reported on 02/21/2016    . isosorbide mononitrate (IMDUR) 60 MG 24 hr tablet Take 60 mg by mouth 2 (two) times daily.     . isosorbide mononitrate (IMDUR) 60 MG 24 hr tablet Take 60 mg by mouth 2 (two) times daily.    Marland Kitchen lidocaine (XYLOCAINE) 2 % solution Use as directed 20 mLs in the mouth or  throat as needed for mouth pain. Apply as part of mixture. (Patient not taking: Reported on 02/22/2017) 100 mL 0  . lisinopril (PRINIVIL,ZESTRIL) 40 MG tablet Take 1 tablet (40 mg total) by mouth daily. (Patient not taking: Reported on 09/14/2016) 90 tablet 3  . losartan (COZAAR) 50 MG tablet Take 25 mg by mouth daily.    . metoprolol (LOPRESSOR) 100 MG tablet Take 100 mg by mouth 2 (two) times daily.    . Multiple Vitamin (MULTIVITAMIN WITH MINERALS) TABS Take 1 tablet by mouth daily. (Patient not taking: Reported on 02/22/2017) 30 tablet 1  . naproxen (NAPROSYN) 500 MG tablet Take 1 tablet (500 mg total) by mouth 2 (two) times daily with a meal. (Patient not taking: Reported on 09/14/2016) 30 tablet 0  . nitroGLYCERIN (NITROSTAT) 0.4 MG SL tablet Place 0.4 mg under the tongue every 5 (five) minutes as needed. For chest pain    . Olodaterol HCl 2.5 MCG/ACT AERS Inhale 2 puffs into the lungs daily.    Marland Kitchen omeprazole (PRILOSEC) 40 MG capsule TAKE 1 CAPSULE (40 MG TOTAL) BY MOUTH DAILY. (Patient not taking: Reported on 02/22/2017) 30 capsule 3  . omeprazole (PRILOSEC)  40 MG capsule Take 1 capsule (40 mg total) by mouth daily. (Patient not taking: Reported on 02/20/2016) 90 capsule 1  . omeprazole (PRILOSEC) 40 MG capsule TAKE 1 CAPSULE BY MOUTH  DAILY (Patient not taking: Reported on 02/22/2017) 90 capsule 0  . Probiotic Product (RESTORA) CAPS Take 1 capsule by mouth daily. (Patient not taking: Reported on 02/20/2016) 10 capsule 0  . tiotropium (SPIRIVA) 18 MCG inhalation capsule Place 18 mcg into inhaler and inhale daily. Reported on 02/20/2016    . zoster vaccine live, PF, (ZOSTAVAX) 52841 UNT/0.65ML injection Inject 19,400 Units into the skin once. (Patient not taking: Reported on 02/20/2016) 1 each 0   No current facility-administered medications for this encounter.     REVIEW OF SYSTEMS:  A 10+ POINT REVIEW OF SYSTEMS WAS OBTAINED including neurology, dermatology, psychiatry, cardiac, respiratory, lymph, extremities, GI, GU, Musculoskeletal, constitutional,   HEENT.  All pertinent positives are noted in the HPI.  All others are negative.    PHYSICAL EXAM:  height is 5\' 8"  (1.727 m) and weight is 163 lb 6.4 oz (74.1 kg). His axillary temperature is 98 F (36.7 C). His blood pressure is 142/92 (abnormal) and his pulse is 100. His oxygen saturation is 96%.   General: Alert and oriented, in no acute distress. HEENT: Head is normocephalic. Extraocular movements are intact. Nasogastric tube in place. Edentulous. Oropharynx is notable for some sutures still in place in the anterior gingiva along the mandible from prior extractions. In the right tonsillar region there is a papular mass that appears benign, less than 1.0 cm in size. The anterior oral tongue is now reconstructed tissue from his forearm graft. This was a generous reconstruction and he appears to be healing well. Grafted tissue appears healthy. No obvious sign of recurrence.  Neck: Neck is notable for bandage where tracheostomy was formerly. He has lymphedema throughout bilateral neck, concentrated anteriorly  and to the right side. No obvious adenopathy appreciated. Surgical scars well healed over neck. Former stoma from his tracheotomy is still healing with an area where subcutaneous tissue is still fresh -not totally healed  Heart: Regular in rate and rhythm with no murmurs. Chest: Decreased breath sounds bilaterally, likely due to the patient's emphysema. Abdomen: Firm, non tender, non rigid. Extremities: No edema in lower extremities. Bandage around the left  wrist where he is recovering from an infection related to his flap reconstruction. Lymphatics: see Neck Exam Skin: No concerning lesions. Musculoskeletal: Some discernible weakness in the left hand on motor exam which he reports is related to a stroke from years ago. Neurologic: No obvious focalities. Speech is fluent. Coordination is intact. Psychiatric: Judgment and insight are intact. Affect is appropriate.  ECOG = 1   LABORATORY DATA:  Lab Results  Component Value Date   WBC 8.5 07/24/2014   HGB 16.1 07/24/2014   HCT 46.4 07/24/2014   MCV 97.1 07/24/2014   PLT 257 07/24/2014   CMP     Component Value Date/Time   NA 135 02/04/2015 1419   K 4.9 02/04/2015 1419   CL 99 02/04/2015 1419   CO2 25 02/04/2015 1419   GLUCOSE 95 02/04/2015 1419   BUN 36 (H) 02/04/2015 1419   CREATININE 1.12 02/04/2015 1419   CALCIUM 9.8 02/04/2015 1419   PROT 7.2 01/06/2015 0905   ALBUMIN 4.0 01/06/2015 0905   AST 34 01/06/2015 0905   ALT 31 01/06/2015 0905   ALKPHOS 86 01/06/2015 0905   BILITOT 0.6 01/06/2015 0905   GFRNONAA >90 07/24/2014 1220   GFRAA >90 07/24/2014 1220      Lab Results  Component Value Date   TSH 1.475 02/04/2015         RADIOGRAPHY: as above     IMPRESSION/PLAN: This is a delightful patient with head and neck cancer. I would recommend adjuvant radiotherapy for this patient.  We discussed the potential risks, benefits, and side effects of radiotherapy. We talked in detail about acute and late effects. We  discussed that some of the most bothersome acute effects may be mucositis, dysgeusia, salivary changes, skin irritation, hair loss, dehydration, weight loss and fatigue. We talked about late effects which include but are not necessarily limited to dysphagia, hypothyroidism, nerve injury, spinal cord injury, xerostomia, trismus, and neck edema. No guarantees of treatment were given. A consent form was signed and placed in the patient's medical record. The patient is enthusiastic about proceeding with treatment. I look forward to participating in the patient's care.    Simulation (treatment planning) will take place 02/26/17. We will tentatively plan to start his treatment on Tuesday 03/12/17 to allow a full 6 weeks of healing and coordination of chemotherapy.  We also discussed that the treatment of head and neck cancer is a multidisciplinary process to maximize treatment outcomes and quality of life. For this reasons the following referrals have been or will be made:  1) Nutritionist for nutrition support during and after treatment.  2) Speech language pathology for swallowing and/or speech therapy. MBSS for baseline swallowing evaluation  3) Social work for social support.   4) Physical therapy due to risk of lymphedema in neck and deconditioning.  5) Baseline labs including TSH.  6) IR to have a feeding tube - PEG - placed. Also ordered NG tube removal after PEG is placed.  Additionally, I encouraged the patient to try 1 dose of Miralax daily until his bowel movements become more regular, and then he may use Miralax once every other day as needed.  When patient sees Dr. Hendricks [name] early next week, he will confirm that the patient appears healed enough to begin with radiation therapy.  The patient is scheduled to meet with Dr. Alvy [name] on 03/05/17. I strongly believe that the patient's well being will be tied into his ability to get treatment close to home, and I strongly recommend that  he get his  chemotherapy as well as radiation therapy in [place]. I am concerned that having to commute for treatment in [place], due to the patient's transportation needs and recurring radiation therapy schedule, may compromise the patient's ability to receive chemotherapy and affect his outcome negatively.  __________________________________________   Eppie Gibson, MD  This document serves as a record of services personally performed by Eppie Gibson, MD. It was created on her behalf by Maryla Morrow, a trained medical scribe. The creation of this record is based on the scribe's personal observations and the provider's statements to them. This document has been checked and approved by the attending provider.

## 2017-02-22 NOTE — Progress Notes (Signed)
Oncology Nurse Navigator Documentation  Met with [name] during initial consult with Dr. Isidore Moos.  He was accompanied by his wife.    1. Further introduced myself as his Navigator, explained my role as a member of the Care Team.   2. Provided New Patient Information packet, discussed contents:  Contact information for physician(s), myself, other members of the Care Team.  Advance Directive information ([place] blue pamphlet with LCSW contact info).  Provided [place] AD forms.  Fall Prevention Patient Safety Plan  Appointment Guideline  Financial Assistance Information sheet  [place] with highlight of [place] 3. Provided introductory explanation of radiation treatment including SIM planning and purpose of Aquaplast head and shoulder mask, showed them example.   4. Provided and discussed education handouts for PEG and PAC.  5. Of note:  He is endentulous.  Expressed concern for VA requirement he receive chemotherapy in [place].  They understand we will appeal.  CT SIM arranged for 3/27 10:00. 6.   Discussed H&N MDC, tentatively scheduled for 4/10. 7.   Provided a tour of SIM and [place] areas, explained treatment and arrival procedures. 8.   I encouraged them to contact me with questions/concerns as treatments/procedures begin.  They verbalized understanding of information provided.    Gayleen Orem, RN, BSN, [place] Neck Oncology Nurse [place] at [place] [phone]

## 2017-02-25 ENCOUNTER — Telehealth: Payer: Self-pay | Admitting: *Deleted

## 2017-02-25 ENCOUNTER — Other Ambulatory Visit: Payer: Self-pay | Admitting: Radiation Oncology

## 2017-02-25 DIAGNOSIS — C029 Malignant neoplasm of tongue, unspecified: Secondary | ICD-10-CM

## 2017-02-25 DIAGNOSIS — C021 Malignant neoplasm of border of tongue: Secondary | ICD-10-CM | POA: Insufficient documentation

## 2017-02-25 DIAGNOSIS — R634 Abnormal weight loss: Secondary | ICD-10-CM

## 2017-02-25 NOTE — Telephone Encounter (Signed)
Oncology Nurse Navigator Documentation  Received call from patient's wife,  provided clarification for 4/3 2:00 appt with Dr. Alvy Bimler.  Gayleen Orem, RN, BSN, [place] Neck Oncology Nurse [place] at [place] [phone]

## 2017-02-26 ENCOUNTER — Ambulatory Visit
Admission: RE | Admit: 2017-02-26 | Discharge: 2017-02-26 | Disposition: A | Discharge status: Home / Self Care | Payer: Medicare PPO | Source: Ambulatory Visit | Attending: Radiation Oncology | Admitting: Radiation Oncology

## 2017-02-26 ENCOUNTER — Telehealth: Payer: Self-pay | Admitting: *Deleted

## 2017-02-26 ENCOUNTER — Encounter: Payer: Self-pay | Admitting: *Deleted

## 2017-02-26 DIAGNOSIS — Z510 Encounter for antineoplastic radiation therapy: Secondary | ICD-10-CM | POA: Diagnosis not present

## 2017-02-26 DIAGNOSIS — C021 Malignant neoplasm of border of tongue: Secondary | ICD-10-CM

## 2017-02-26 MED ORDER — SODIUM CHLORIDE 0.9% FLUSH
10.0000 mL | Freq: Once | INTRAVENOUS | Status: AC
  Administered 2017-02-26: 10 mL via INTRAVENOUS

## 2017-02-26 NOTE — Telephone Encounter (Signed)
Oncology Nurse Navigator Documentation  LVMM for [place] to call me re site for provision of chemotherapy.  Gayleen Orem, RN, BSN, [place] Neck Oncology Nurse [place] at [place] [phone]

## 2017-02-26 NOTE — Progress Notes (Signed)
Head and Neck Cancer Simulation, IMRT treatment planning, and Special treatment procedure note   Outpatient  Diagnosis:    ICD-9-CM ICD-10-CM   1. 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HCC) 141.2 C02.1 sodium chloride flush (NS) 0.9 % injection 10 mL    The patient was taken to the CT simulator and laid in the supine position on the table. An Aquaplast head and shoulder mask was custom fitted to the patient's anatomy. High-resolution CT axial imaging was obtained of the head and neck with contrast. I verified that the quality of the imaging is good for treatment planning. 1 Medically Necessary Treatment Device was fabricated and supervised by me: Aquaplast mask.   Treatment planning note I plan to treat the patient with IMRT. I plan to treat the patient's tumor bed and bilateral neck nodes. I plan to treat to a total dose of 60 Gray in 30  fractions. Dose calculation was ordered from dosimetry.  IMRT planning Note  IMRT is medically necessary and an important modality to deliver adequate dose to the patient's at risk tissues while sparing the patient's normal structures, including the: esophagus, parotid tissue, mandible, brain stem, spinal cord, oral cavity, brachial plexus.  This justifies the use of IMRT in the patient's treatment.   Special Treatment Procedure Note:  The patient will be receiving chemotherapy concurrently. Chemotherapy heightens the risk of side effects. I have considered this during the patient's treatment planning process and will monitor the patient accordingly for side effects on a weekly basis. Concurrent chemotherapy increases the complexity of this patient's treatment and therefore this constitutes a special treatment procedure.  -----------------------------------  Eppie Gibson, MD

## 2017-02-26 NOTE — Progress Notes (Signed)
  Has armband been applied?  Yes  Does patient have an allergy to IV contrast dye?: no   Has patient ever received premedication for IV contrast dye?: yes   Does patient take metformin?: no  If patient does take metformin when was the last dose:   Date of lab work: 02/04/2017 BUN: 18  CR: 0.86  IV site: Right Ac  Has IV site been added to flowsheet?  yes  There were no vitals taken for this visit.

## 2017-02-27 ENCOUNTER — Encounter: Payer: Self-pay | Admitting: *Deleted

## 2017-02-27 ENCOUNTER — Telehealth: Payer: Self-pay | Admitting: *Deleted

## 2017-02-27 NOTE — Progress Notes (Signed)
Oncology Nurse Navigator Documentation  To provide support, encouragement and care continuity, met with Mr. Allums during his CT SIM.  He was accompanied by his wife. He tolerated procedure without difficulty, denied concerns. I provided tour of treatment area, explained registration, arrival and preparation procedures. He voiced understanding of information provided.  Rick , RN, BSN, CHPN Head & Neck Oncology Nurse Navigator [facility] at [place] [phone]   

## 2017-02-27 NOTE — Telephone Encounter (Signed)
Oncology Nurse Navigator Documentation  Spoke with Ashley Mariner, [facility] Navigator, re coordination of PEG and PAC placement at [facility] [employer], transfer of chemotherapy administration to [employer].  I engaged her with Jodelle Green, RadOnc Adminstrative Support, to provide necessary information.  I later issued letter for Dr. Pearlie Oyster signature re medical necessity of PEG and PAC, provided to [facility].  Gayleen Orem, RN, BSN, [place]ck Oncology Nurse [place] at [place] [phone]

## 2017-02-28 ENCOUNTER — Other Ambulatory Visit: Payer: Self-pay | Admitting: Radiology

## 2017-02-28 ENCOUNTER — Other Ambulatory Visit: Payer: Self-pay | Admitting: Student

## 2017-02-28 ENCOUNTER — Telehealth: Payer: Self-pay | Admitting: *Deleted

## 2017-02-28 ENCOUNTER — Other Ambulatory Visit: Payer: Self-pay | Admitting: *Deleted

## 2017-02-28 DIAGNOSIS — K148 Other diseases of tongue: Secondary | ICD-10-CM

## 2017-02-28 NOTE — Telephone Encounter (Signed)
Oncology Nurse Navigator Documentation  In follow-up to call from [facility], [place], and indication VA has approved PEG and PAC placement at [facility], called [facility]. Provided authorization [id number].    Called patient, informed of VA approval.  Informed need to arrive [facility] 0700, reiterated instructions for drinking barium contrast this evening, NPO status at midnight.  Gayleen Orem, RN, BSN, [place] Neck Oncology Nurse [place] at [place] [phone]

## 2017-03-01 ENCOUNTER — Encounter: Payer: Self-pay | Admitting: *Deleted

## 2017-03-01 ENCOUNTER — Ambulatory Visit (HOSPITAL_COMMUNITY)
Admission: RE | Admit: 2017-03-01 | Discharge: 2017-03-01 | Disposition: A | Discharge status: Home / Self Care | Payer: No Typology Code available for payment source | Source: Ambulatory Visit | Attending: Radiation Oncology | Admitting: Radiation Oncology

## 2017-03-01 ENCOUNTER — Other Ambulatory Visit: Payer: Self-pay | Admitting: Radiation Oncology

## 2017-03-01 ENCOUNTER — Encounter: Payer: Self-pay | Admitting: Hematology and Oncology

## 2017-03-01 ENCOUNTER — Encounter (HOSPITAL_COMMUNITY): Payer: Self-pay

## 2017-03-01 ENCOUNTER — Encounter: Payer: Self-pay | Admitting: Internal Medicine

## 2017-03-01 DIAGNOSIS — J449 Chronic obstructive pulmonary disease, unspecified: Secondary | ICD-10-CM | POA: Diagnosis not present

## 2017-03-01 DIAGNOSIS — Z85828 Personal history of other malignant neoplasm of skin: Secondary | ICD-10-CM | POA: Insufficient documentation

## 2017-03-01 DIAGNOSIS — Z8673 Personal history of transient ischemic attack (TIA), and cerebral infarction without residual deficits: Secondary | ICD-10-CM | POA: Insufficient documentation

## 2017-03-01 DIAGNOSIS — M1990 Unspecified osteoarthritis, unspecified site: Secondary | ICD-10-CM | POA: Insufficient documentation

## 2017-03-01 DIAGNOSIS — I252 Old myocardial infarction: Secondary | ICD-10-CM | POA: Insufficient documentation

## 2017-03-01 DIAGNOSIS — K449 Diaphragmatic hernia without obstruction or gangrene: Secondary | ICD-10-CM | POA: Diagnosis not present

## 2017-03-01 DIAGNOSIS — E785 Hyperlipidemia, unspecified: Secondary | ICD-10-CM | POA: Insufficient documentation

## 2017-03-01 DIAGNOSIS — I10 Essential (primary) hypertension: Secondary | ICD-10-CM | POA: Diagnosis not present

## 2017-03-01 DIAGNOSIS — I739 Peripheral vascular disease, unspecified: Secondary | ICD-10-CM | POA: Diagnosis not present

## 2017-03-01 DIAGNOSIS — Z7982 Long term (current) use of aspirin: Secondary | ICD-10-CM | POA: Insufficient documentation

## 2017-03-01 DIAGNOSIS — K148 Other diseases of tongue: Secondary | ICD-10-CM

## 2017-03-01 DIAGNOSIS — C029 Malignant neoplasm of tongue, unspecified: Secondary | ICD-10-CM | POA: Insufficient documentation

## 2017-03-01 DIAGNOSIS — Z87891 Personal history of nicotine dependence: Secondary | ICD-10-CM | POA: Diagnosis not present

## 2017-03-01 DIAGNOSIS — R634 Abnormal weight loss: Secondary | ICD-10-CM

## 2017-03-01 HISTORY — PX: IR GENERIC HISTORICAL: IMG1180011

## 2017-03-01 LAB — CBC
HCT: 39.1 % (ref 39.0–52.0)
Hemoglobin: 13.2 g/dL (ref 13.0–17.0)
MCH: 32.3 pg (ref 26.0–34.0)
MCHC: 33.8 g/dL (ref 30.0–36.0)
MCV: 95.6 fL (ref 78.0–100.0)
PLATELETS: 282 10*3/uL (ref 150–400)
RBC: 4.09 MIL/uL — ABNORMAL LOW (ref 4.22–5.81)
RDW: 12.9 % (ref 11.5–15.5)
WBC: 7.9 10*3/uL (ref 4.0–10.5)

## 2017-03-01 LAB — PROTIME-INR
INR: 1
Prothrombin Time: 13.2 seconds (ref 11.4–15.2)

## 2017-03-01 LAB — APTT: APTT: 32 s (ref 24–36)

## 2017-03-01 MED ORDER — MIDAZOLAM HCL 2 MG/2ML IJ SOLN
INTRAMUSCULAR | Status: AC | PRN
  Administered 2017-03-01 (×7): 1 mg via INTRAVENOUS

## 2017-03-01 MED ORDER — LIDOCAINE HCL 1 % IJ SOLN
INTRAMUSCULAR | Status: AC | PRN
  Administered 2017-03-01: 10 mL

## 2017-03-01 MED ORDER — SODIUM CHLORIDE 0.9 % IV SOLN
INTRAVENOUS | Status: DC
  Administered 2017-03-01: 07:00:00 via INTRAVENOUS

## 2017-03-01 MED ORDER — LIDOCAINE-EPINEPHRINE (PF) 2 %-1:200000 IJ SOLN
INTRAMUSCULAR | Status: AC
  Filled 2017-03-01: qty 20

## 2017-03-01 MED ORDER — CEFAZOLIN SODIUM-DEXTROSE 2-4 GM/100ML-% IV SOLN
2.0000 g | INTRAVENOUS | Status: AC
  Administered 2017-03-01: 2 g via INTRAVENOUS

## 2017-03-01 MED ORDER — IOPAMIDOL (ISOVUE-300) INJECTION 61%: 50.0000 mL | Freq: Once | INTRAVENOUS | Status: DC | PRN

## 2017-03-01 MED ORDER — FENTANYL CITRATE (PF) 100 MCG/2ML IJ SOLN
INTRAMUSCULAR | Status: AC
  Filled 2017-03-01: qty 4

## 2017-03-01 MED ORDER — HEPARIN SOD (PORK) LOCK FLUSH 100 UNIT/ML IV SOLN
INTRAVENOUS | Status: AC
Start: 2017-03-01 — End: 2017-03-01
  Filled 2017-03-01: qty 5

## 2017-03-01 MED ORDER — LIDOCAINE HCL 1 % IJ SOLN
INTRAMUSCULAR | Status: AC
  Filled 2017-03-01: qty 20

## 2017-03-01 MED ORDER — HEPARIN SOD (PORK) LOCK FLUSH 100 UNIT/ML IV SOLN
INTRAVENOUS | Status: AC | PRN
  Administered 2017-03-01: 500 [IU] via INTRAVENOUS

## 2017-03-01 MED ORDER — IOPAMIDOL (ISOVUE-300) INJECTION 61%
INTRAVENOUS | Status: AC
  Administered 2017-03-01: 15 mL
  Filled 2017-03-01: qty 50

## 2017-03-01 MED ORDER — MIDAZOLAM HCL 2 MG/2ML IJ SOLN
INTRAMUSCULAR | Status: AC
  Filled 2017-03-01: qty 6

## 2017-03-01 MED ORDER — OXYCODONE-ACETAMINOPHEN 5-325 MG PO TABS: 1.0000 | ORAL_TABLET | ORAL | Status: DC | PRN

## 2017-03-01 MED ORDER — CEFAZOLIN SODIUM-DEXTROSE 2-4 GM/100ML-% IV SOLN
INTRAVENOUS | Status: AC
  Filled 2017-03-01: qty 100

## 2017-03-01 MED ORDER — GLUCAGON HCL RDNA (DIAGNOSTIC) 1 MG IJ SOLR
INTRAMUSCULAR | Status: AC
  Filled 2017-03-01: qty 1

## 2017-03-01 MED ORDER — OXYCODONE HCL 5 MG/5ML PO SOLN
5.0000 mg | Freq: Once | ORAL | Status: AC
  Administered 2017-03-01: 5 mg via ORAL

## 2017-03-01 MED ORDER — GLUCAGON HCL RDNA (DIAGNOSTIC) 1 MG IJ SOLR
INTRAMUSCULAR | Status: AC | PRN
  Administered 2017-03-01: 1 mg via INTRAVENOUS

## 2017-03-01 MED ORDER — FENTANYL CITRATE (PF) 100 MCG/2ML IJ SOLN
INTRAMUSCULAR | Status: AC | PRN
  Administered 2017-03-01: 25 ug via INTRAVENOUS
  Administered 2017-03-01: 50 ug via INTRAVENOUS
  Administered 2017-03-01: 25 ug via INTRAVENOUS

## 2017-03-01 MED ORDER — MIDAZOLAM HCL 2 MG/2ML IJ SOLN
INTRAMUSCULAR | Status: AC
  Filled 2017-03-01: qty 2

## 2017-03-01 NOTE — Procedures (Signed)
  Pre-operative Diagnosis: Tongue cancer       Post-operative Diagnosis: Tongue cancer   Indications: Needs port for chemo and G-tube for nutrition  Procedure: Port placement; G-tube  Findings: Right IJ port, tip at SVC/RA junction; 14 Fr G-tube  Complications: None     EBL: Minimal  Plan: Bedrest 3 hours.
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
## 2017-03-01 NOTE — Discharge Instructions (Signed)
Gastrostomy Tube Home Guide, Adult A gastrostomy tube is a tube that is surgically placed into the stomach. It is also called a G-tube. G-tubes are used when a person is unable to eat and drink enough on their own to stay healthy. The tube is inserted into the stomach through a small cut (incision) in the skin. This tube is used for:  Feeding.  Giving medication. Gastrostomy tube care  Wash your hands with soap and water.  Remove the old dressing (if any). Some styles of G-tubes may need a dressing inserted between the skin and the G-tube. Other types of G-tubes do not require a dressing. Ask your health care provider if a dressing is needed.  Check the area where the tube enters the skin (insertion site) for redness, swelling, or pus-like (purulent) drainage. A small amount of clear or tan liquid drainage is normal. Check to make sure scar tissue (skin) is not growing around the insertion site. This could have a raised, bumpy appearance.  A cotton swab can be used to clean the skin around the tube:  When the G-tube is first put in, a normal saline solution or water can be used to clean the skin.  Mild soap and warm water can be used when the skin around the G-tube site has healed.  Roll the cotton swab around the G-tube insertion site to remove any drainage or crusting at the insertion site. Stomach residuals Feeding tube residuals are the amount of liquids that are in the stomach at any given time. Residuals may be checked before giving feedings, medications, or as instructed by your health care provider.  Ask your health care provider if there are instances when you would not start tube feedings depending on the amount or type of contents withdrawn from the stomach.  Check residuals by attaching a syringe to the G-tube and pulling back on the syringe plunger. Note the amount, and return the residual back into the stomach. Flushing the G-tube  The G-tube should be periodically  flushed with clean warm water to keep it from clogging.  Flush the G-tube after feedings or medications. Draw up 30 mL of warm water in a syringe. Connect the syringe to the G-tube and slowly push the water into the tube.  Do not push feedings, medications, or flushes rapidly. Flush the G-tube gently and slowly.  Only use syringes made for G-tubes to flush medications or feedings.  Your health care provider may want the G-tube flushed more often or with more water. If this is the case, follow your health care provider's instructions. Feedings Your health care provider will determine whether feedings are given as a bolus (a certain amount given at one time and at scheduled times) or whether feedings will be given continuously on a feeding pump.  Formulas should be given at room temperature.  If feedings are continuous, no more than 4 hours worth of feedings should be placed in the feeding bag. This helps prevent spoilage or accidental excess infusion.  Cover and place unused formula in the refrigerator.  If feedings are continuous, stop the feedings when medications or flushes are given. Be sure to restart the feedings.  Feeding bags and syringes should be replaced as instructed by your health care provider. Giving medication  In general, it is best if all medications are in a liquid form for G-tube administration. Liquid medications are less likely to clog the G-tube.  Mix the liquid medication with 30 mL (or amount recommended by your health  care provider) of warm water.  Draw up the medication into the syringe.  Attach the syringe to the G-tube and slowly push the mixture into the G-tube.  After giving the medication, draw up 30 mL of warm water in the syringe and slowly flush the G-tube.  For pills or capsules, check with your health care provider first before crushing medications. Some pills are not effective if they are crushed. Some capsules are sustained-release  medications.  If appropriate, crush the pill or capsule and mix with 30 mL of warm water. Using the syringe, slowly push the medication through the tube, then flush the tube with another 30 mL of tap water. G-tube problems G-tube was pulled out.  Cause: May have been pulled out accidentally.  Solutions: Cover the opening with clean dressing and tape. Call your health care provider right away. The G-tube should be put in as soon as possible (within 4 hours) so the G-tube opening (tract) does not close. The G-tube needs to be put in at a health care setting. An X-ray needs to be done to confirm placement before the G-tube can be used again. Redness, irritation, soreness, or foul odor around the gastrostomy site.  Cause: May be caused by leakage or infection.  Solutions: Call your health care provider right away. Large amount of leakage of fluid or mucus-like liquid present (a large amount means it soaks clothing).  Cause: Many reasons could cause the G-tube to leak.  Solutions: Call your health care provider to discuss the amount of leakage. Skin or scar tissue appears to be growing where tube enters skin.  Cause: Tissue growth may develop around the insertion site if the G-tube is moved or pulled on excessively.  Solutions: Secure tube with tape so that excess movement does not occur. Call your health care provider. G-tube is clogged.  Cause: Thick formula or medication.  Solutions: Try to slowly push warm water into the tube with a large syringe. Never try to push any object into the tube to unclog it. Do not force fluid into the G-tube. If you are unable to unclog the tube, call your health care provider right away. Tips  Head of bed (HOB) position refers to the upright position of a person's upper body.  When giving medications or a feeding bolus, keep the [facility] up as told by your health care provider. Do this during the feeding and for 1 hour after the feeding or medication  administration.  If continuous feedings are being given, it is best to keep the [facility] up as told by your health care provider. When ADLs (activities of daily living) are performed and the [facility] needs to be flat, be sure to turn the feeding pump off. Restart the feeding pump when the [facility] is returned to the recommended height.  Do not pull or put tension on the tube.  To prevent fluid backflow, kink the G-tube before removing the cap or disconnecting a syringe.  Check the G-tube length every day. Measure from the insertion site to the end of the G-tube. If the length is longer than previous measurements, the tube may be coming out. Call your health care provider if you notice increasing G-tube length.  Oral care, such as brushing teeth, must be continued.  You may need to remove excess air (vent) from the G-tube. Your health care provider will tell you if this is needed.  Always call your health care provider if you have questions or problems with the G-tube. Get help  right away if:  You have severe abdominal pain, tenderness, or abdominal bloating (distension).  You have nausea or vomiting.  You are constipated or have problems moving your bowels.  The G-tube insertion site is red, swollen, has a foul smell, or has yellow or brown drainage.  You have difficulty breathing or shortness of breath.  You have a fever.  You have a large amount of feeding tube residuals.  The G-tube is clogged and cannot be flushed. This information is not intended to replace advice given to you by your health care provider. Make sure you discuss any questions you have with your health care provider. Document Released: 01/28/2002 Document Revised: 04/26/2016 Document Reviewed: 07/27/2013 Elsevier Interactive Patient Education  2017 [place] An implanted port is a type of central line that is placed under the skin. Central lines are used to provide IV access when treatment or  nutrition needs to be given through a persons veins. Implanted ports are used for long-term IV access. An implanted port may be placed because:  You need IV medicine that would be irritating to the small veins in your hands or arms.  You need long-term IV medicines, such as antibiotics.  You need IV nutrition for a long period.  You need frequent blood draws for lab tests.  You need dialysis. Implanted ports are usually placed in the chest area, but they can also be placed in the upper arm, the abdomen, or the leg. An implanted port has two main parts:  Reservoir. The reservoir is round and will appear as a small, raised area under your skin. The reservoir is the part where a needle is inserted to give medicines or draw blood.  Catheter. The catheter is a thin, flexible tube that extends from the reservoir. The catheter is placed into a large vein. Medicine that is inserted into the reservoir goes into the catheter and then into the vein. How will I care for my incision site? Do not get the incision site wet. Bathe or shower as directed by your health care provider. How is my port accessed? Special steps must be taken to access the port:  Before the port is accessed, a numbing cream can be placed on the skin. This helps numb the skin over the port site.  Your health care provider uses a sterile technique to access the port.  Your health care provider must put on a mask and sterile gloves.  The skin over your port is cleaned carefully with an antiseptic and allowed to dry.  The port is gently pinched between sterile gloves, and a needle is inserted into the port.  Only "non-coring" port needles should be used to access the port. Once the port is accessed, a blood return should be checked. This helps ensure that the port is in the vein and is not clogged.  If your port needs to remain accessed for a constant infusion, a clear (transparent) bandage will be placed over the needle site.  The bandage and needle will need to be changed every week, or as directed by your health care provider.  Keep the bandage covering the needle clean and dry. Do not get it wet. Follow your health care providers instructions on how to take a shower or bath while the port is accessed.  If your port does not need to stay accessed, no bandage is needed over the port. What is flushing? Flushing helps keep the port from getting clogged. Follow your health  care providers instructions on how and when to flush the port. Ports are usually flushed with saline solution or a medicine called heparin. The need for flushing will depend on how the port is used.  If the port is used for intermittent medicines or blood draws, the port will need to be flushed:  After medicines have been given.  After blood has been drawn.  As part of routine maintenance.  If a constant infusion is running, the port may not need to be flushed. How long will my port stay implanted? The port can stay in for as long as your health care provider thinks it is needed. When it is time for the port to come out, surgery will be done to remove it. The procedure is similar to the one performed when the port was put in. When should I seek immediate medical care? When you have an implanted port, you should seek immediate medical care if:  You notice a bad smell coming from the incision site.  You have swelling, redness, or drainage at the incision site.  You have more swelling or pain at the port site or the surrounding area.  You have a fever that is not controlled with medicine. This information is not intended to replace advice given to you by your health care provider. Make sure you discuss any questions you have with your health care provider. Document Released: 11/19/2005 Document Revised: 04/26/2016 Document Reviewed: 07/27/2013 Elsevier Interactive Patient Education  2017 [place] Insertion, Care  After This sheet gives you information about how to care for yourself after your procedure. Your health care provider may also give you more specific instructions. If you have problems or questions, contact your health care provider. What can I expect after the procedure? After your procedure, it is common to have:  Discomfort at the port insertion site.  Bruising on the skin over the port. This should improve over 3-4 days. Follow these instructions at home: [facility] care   After your port is placed, you will get a manufacturer's information card. The card has information about your port. Keep this card with you at all times.  Take care of the port as told by your health care provider. Ask your health care provider if you or a family member can get training for taking care of the port at home. A home health care nurse may also take care of the port.  Make sure to remember what type of port you have. Incision care   Follow instructions from your health care provider about how to take care of your port insertion site. Make sure you:  Wash your hands with soap and water before you change your bandage (dressing). If soap and water are not available, use hand sanitizer.  Change your dressing as told by your health care provider.  Leave stitches (sutures), skin glue, or adhesive strips in place. These skin closures may need to stay in place for 2 weeks or longer. If adhesive strip edges start to loosen and curl up, you may trim the loose edges. Do not remove adhesive strips completely unless your health care provider tells you to do that.  Check your port insertion site every day for signs of infection. Check for:  More redness, swelling, or pain.  More fluid or blood.  Warmth.  Pus or a bad smell. General instructions   Do not take baths, swim, or use a hot tub until your health care provider approves.  Do not  lift anything that is heavier than 10 lb (4.5 kg) for a week, or as told by  your health care provider.  Ask your health care provider when it is okay to:  Return to work or school.  Resume usual physical activities or sports.  Do not drive for 24 hours if you were given a medicine to help you relax (sedative).  Take over-the-counter and prescription medicines only as told by your health care provider.  Wear a medical alert bracelet in case of an emergency. This will tell any health care providers that you have a port.  Keep all follow-up visits as told by your health care provider. This is important. Contact a health care provider if:  You cannot flush your port with saline as directed, or you cannot draw blood from the port.  You have a fever or chills.  You have more redness, swelling, or pain around your port insertion site.  You have more fluid or blood coming from your port insertion site.  Your port insertion site feels warm to the touch.  You have pus or a bad smell coming from the port insertion site. Get help right away if:  You have chest pain or shortness of breath.  You have bleeding from your port that you cannot control. Summary  Take care of the port as told by your health care provider.  Change your dressing as told by your health care provider.  Keep all follow-up visits as told by your health care provider. This information is not intended to replace advice given to you by your health care provider. Make sure you discuss any questions you have with your health care provider. Document Released: 09/09/2013 Document Revised: 10/10/2016 Document Reviewed: 10/10/2016 Elsevier Interactive Patient Education  2017 [place]. Moderate Conscious Sedation, Adult, Care After These instructions provide you with information about caring for yourself after your procedure. Your health care provider may also give you more specific instructions. Your treatment has been planned according to current medical practices, but problems sometimes  occur. Call your health care provider if you have any problems or questions after your procedure. What can I expect after the procedure? After your procedure, it is common:  To feel sleepy for several hours.  To feel clumsy and have poor balance for several hours.  To have poor judgment for several hours.  To vomit if you eat too soon. Follow these instructions at home: For at least 24 hours after the procedure:    Do not:  Participate in activities where you could fall or become injured.  Drive.  Use heavy machinery.  Drink alcohol.  Take sleeping pills or medicines that cause drowsiness.  Make important decisions or sign legal documents.  Take care of children on your own.  Rest. Eating and drinking   Follow the diet recommended by your health care provider.  If you vomit:  Drink water, juice, or soup when you can drink without vomiting.  Make sure you have little or no nausea before eating solid foods. General instructions   Have a responsible adult stay with you until you are awake and alert.  Take over-the-counter and prescription medicines only as told by your health care provider.  If you smoke, do not smoke without supervision.  Keep all follow-up visits as told by your health care provider. This is important. Contact a health care provider if:  You keep feeling nauseous or you keep vomiting.  You feel light-headed.  You develop a rash.  You have a fever. Get help right away if:  You have trouble breathing. This information is not intended to replace advice given to you by your health care provider. Make sure you discuss any questions you have with your health care provider. Document Released: 09/09/2013 Document Revised: 04/23/2016 Document Reviewed: 03/10/2016 Elsevier Interactive Patient Education  2017 [employer].

## 2017-03-01 NOTE — Discharge Instructions (Signed)
Implanted [facility] Guide An implanted port is a type of central line that is placed under the skin. Central lines are used to provide IV access when treatment or nutrition needs to be given through a persons veins. Implanted ports are used for long-term IV access. An implanted port may be placed because:  You need IV medicine that would be irritating to the small veins in your hands or arms.  You need long-term IV medicines, such as antibiotics.  You need IV nutrition for a long period.  You need frequent blood draws for lab tests.  You need dialysis. Implanted ports are usually placed in the chest area, but they can also be placed in the upper arm, the abdomen, or the leg. An implanted port has two main parts:  Reservoir. The reservoir is round and will appear as a small, raised area under your skin. The reservoir is the part where a needle is inserted to give medicines or draw blood.  Catheter. The catheter is a thin, flexible tube that extends from the reservoir. The catheter is placed into a large vein. Medicine that is inserted into the reservoir goes into the catheter and then into the vein. How will I care for my incision site? May shower in 24 hours How is my port accessed? Special steps must be taken to access the port:  Before the port is accessed, a numbing cream can be placed on the skin. This helps numb the skin over the port site.  Your health care provider uses a sterile technique to access the port.  Your health care provider must put on a mask and sterile gloves.  The skin over your port is cleaned carefully with an antiseptic and allowed to dry.  The port is gently pinched between sterile gloves, and a needle is inserted into the port.  Only "non-coring" port needles should be used to access the port. Once the port is accessed, a blood return should be checked. This helps ensure that the port is in the vein and is not clogged.  If your port needs to remain  accessed for a constant infusion, a clear (transparent) bandage will be placed over the needle site. The bandage and needle will need to be changed every week, or as directed by your health care provider.  Keep the bandage covering the needle clean and dry. Do not get it wet. Follow your health care providers instructions on how to take a shower or bath while the port is accessed.  If your port does not need to stay accessed, no bandage is needed over the port. What is flushing? Flushing helps keep the port from getting clogged. Follow your health care providers instructions on how and when to flush the port. Ports are usually flushed with saline solution or a medicine called heparin. The need for flushing will depend on how the port is used.  If the port is used for intermittent medicines or blood draws, the port will need to be flushed:  After medicines have been given.  After blood has been drawn.  As part of routine maintenance.  If a constant infusion is running, the port may not need to be flushed. How long will my port stay implanted? The port can stay in for as long as your health care provider thinks it is needed. When it is time for the port to come out, surgery will be done to remove it. The procedure is similar to the one performed when the port was  put in. When should I seek immediate medical care? When you have an implanted port, you should seek immediate medical care if:  You notice a bad smell coming from the incision site.  You have swelling, redness, or drainage at the incision site.  You have more swelling or pain at the port site or the surrounding area.  You have a fever that is not controlled with medicine. This information is not intended to replace advice given to you by your health care provider. Make sure you discuss any questions you have with your health care provider. Document Released: 11/19/2005 Document Revised: 04/26/2016 Document Reviewed:  07/27/2013 Elsevier Interactive Patient Education  2017 [place] of a Feeding Tube People who have trouble swallowing or cannot take food or medicine by mouth are sometimes given feeding tubes. A feeding tube can go into the nose and down to the stomach or through the skin in the abdomen and into the stomach or small bowel. Some of the names of these feeding tubes are gastrostomy tubes, PEG lines, nasogastric tubes, and gastrojejunostomy tubes. Supplies needed to care for the tube site:  Clean gloves.  Clean wash cloth, gauze pads, or soft paper towel.  Cotton swabs.  Skin barrier ointment or cream.  Soap and water.  Pre-cut foam pads or gauze (that go around the tube).  Tube tape. Tube site care 1. Have all supplies ready and available. 2. Wash hands well. 3. Put on clean gloves. 4. Remove the soiled foam pad or gauze, if present, that is found under the tube stabilizer. Change the foam pad or gauze daily or when soiled or moist. 5. Check the skin around the tube site for redness, rash, swelling, drainage, or extra tissue growth. If you notice any of these, call your caregiver. 6. Moisten gauze and cotton swabs with water and soap. 7. Wipe the area closest to the tube (right near the stoma) with cotton swabs. Wipe the surrounding skin with moistened gauze. Rinse with water. 8. Dry the skin and stoma site with a dry gauze pad or soft paper towel. Do not use antibiotic ointments at the tube site. 9. If the skin is red, apply a skin barrier cream or ointment (such as petroleum jelly) in a circular motion, using a cotton swab. The cream or ointment will provide a moisture barrier for the skin and helps with wound healing. 10. Apply a new pre-cut foam pad or gauze around the tube. Secure it with tape around the edges. If no drainage is present, foam pads or gauze may be left off. 11. Use tape or an anchoring device to fasten the feeding tube to the skin for comfort or as  directed. Rotate where you tape the tube to avoid skin damage from the adhesive. 12. Position the person in a semi-upright position (30-45 degree angle). 13. Throw away used supplies. 14. Remove gloves. 15. Wash hands. Supplies needed to flush a feeding tube:  Clean gloves.  60 mL syringe (that connects to the feeding tube).  Towel.  Water. Flushing a feeding tube 1. Have all supplies ready and available. 2. Wash hands well. 3. Put on clean gloves. 4. Draw up 30 mL of water in the syringe. 5. Kink the feeding tube while disconnecting it from the feeding-bag tubing or while removing the plug at the end of the tube. Kinking closes the tube and prevents secretions in the tube from spilling out. 6. Insert the tip of the syringe into the end  of the feeding tube. Release the kink. Slowly inject the water. 7. If unable to inject the water, the person with the feeding tube should lay on his or her left side. The tip of the tube may be against the stomach wall, blocking fluid flow. Changing positions may move the tip away from the stomach wall. After repositioning, try injecting the water again.  Do not use a syringe smaller than 60 mL to flush the tubing.  Do not use excessive force to overcome resistance because this could cause the tube to rupture. 8. After injecting the water, remove the syringe. 9. Always flush before giving the first medicine, between medicines, and after the final medicine before starting a feeding. This prevents medicines from clogging the tube.  Do not mix medicines with formula or with other medicines before giving medicines.  Thoroughly flush medicines through the tube so they do not mix with formula. 10. Throw away used supplies. 11. Remove gloves. 12. Wash hands. This information is not intended to replace advice given to you by your health care provider. Make sure you discuss any questions you have with your health care provider. Document Released: 11/19/2005  Document Revised: 05/02/2016 Document Reviewed: 07/03/2012 Elsevier Interactive Patient Education  2017 [place].   Moderate Conscious Sedation, Adult, Care After These instructions provide you with information about caring for yourself after your procedure. Your health care provider may also give you more specific instructions. Your treatment has been planned according to current medical practices, but problems sometimes occur. Call your health care provider if you have any problems or questions after your procedure. What can I expect after the procedure? After your procedure, it is common:  To feel sleepy for several hours.  To feel clumsy and have poor balance for several hours.  To have poor judgment for several hours.  To vomit if you eat too soon. Follow these instructions at home: For at least 24 hours after the procedure:    Do not:  Participate in activities where you could fall or become injured.  Drive.  Use heavy machinery.  Drink alcohol.  Take sleeping pills or medicines that cause drowsiness.  Make important decisions or sign legal documents.  Take care of children on your own.  Rest. Eating and drinking   Follow the diet recommended by your health care provider.  If you vomit:  Drink water, juice, or soup when you can drink without vomiting.  Make sure you have little or no nausea before eating solid foods. General instructions   Have a responsible adult stay with you until you are awake and alert.  Take over-the-counter and prescription medicines only as told by your health care provider.  If you smoke, do not smoke without supervision.  Keep all follow-up visits as told by your health care provider. This is important. Contact a health care provider if:  You keep feeling nauseous or you keep vomiting.  You feel light-headed.  You develop a rash.  You have a fever. Get help right away if:  You have trouble breathing. This  information is not intended to replace advice given to you by your health care provider. Make sure you discuss any questions you have with your health care provider. Document Released: 09/09/2013 Document Revised: 04/23/2016 Document Reviewed: 03/10/2016 Elsevier Interactive Patient Education  2017 [employer].

## 2017-03-01 NOTE — Progress Notes (Signed)
Oncology Nurse Navigator Documentation  Met with patient and wife [id number] to provide PEG education. Using  PEG teaching device   and Teach Back, provided education for PEG use and care, including: hand hygiene, gravity bolus administration of daily water flushes, nutritional supplement, fluids and medications; care of tube insertion site including daily dressing change and cleaning; S&S of infection.  Patient and wife provided correct return demonstration of bolus water administration, wife correctly verbalized dressing change and cleaning procedures.  I provided written instructions for PEG flushing/dressing change in support of verbal instruction.  They understand I will be available for ongoing PEG educational support.  I discussed contents of yet-to-be-delivered PEG starter kit provided by [facility] (6 syringes, ca 20 packets 4x4 split gauze, 3 rolls paper tape, bag of gloves).  Provided package of mesh briefs, explained use for securing PEG as an alternative to tape.   Gayleen Orem, RN, BSN, [place] [facility] Nurse [facility] [place] [phone]

## 2017-03-04 ENCOUNTER — Telehealth: Payer: Self-pay | Admitting: Student

## 2017-03-04 NOTE — Telephone Encounter (Signed)
PA informed wife called with questions after recent Port and G-tube placement 3/30. Returned call but no answer. Left voicemail for her to call back if she still has questions or concerns.   Brynda Greathouse, MS RD PA-C 4:27 PM

## 2017-03-04 NOTE — Telephone Encounter (Signed)
Spoke with wife regarding patient discomfort with feeding sessions.  Patient is infusing 250 mL tube feeding  q 4 hrs with a syringe.  States he feels like he has "gas" and is uncomfortable after feedings.  He is infusing tube feeding over 20 minute period. Discussed extending infusion time and using gravity feeds if possible.  Wife states she feels patient may be "forcing feeds to quickly."  He has an appointment with Dr. Alvy Bimler tomorrow and will discuss at appointment.  Also provided her with the number for the [facility] RD.  Wife agreeable to plan.   Brynda Greathouse, MS RD PA-C 5:33 PM

## 2017-03-05 ENCOUNTER — Encounter: Payer: Self-pay | Admitting: *Deleted

## 2017-03-05 ENCOUNTER — Encounter: Payer: Self-pay | Admitting: Hematology and Oncology

## 2017-03-05 ENCOUNTER — Telehealth: Payer: Self-pay | Admitting: Hematology and Oncology

## 2017-03-05 ENCOUNTER — Ambulatory Visit (HOSPITAL_BASED_OUTPATIENT_CLINIC_OR_DEPARTMENT_OTHER): Payer: No Typology Code available for payment source | Admitting: Hematology and Oncology

## 2017-03-05 VITALS — BP 148/81 | HR 84 | Temp 97.7°F | Resp 17 | Ht 68.0 in | Wt 166.3 lb

## 2017-03-05 DIAGNOSIS — R634 Abnormal weight loss: Secondary | ICD-10-CM | POA: Diagnosis not present

## 2017-03-05 DIAGNOSIS — I252 Old myocardial infarction: Secondary | ICD-10-CM | POA: Diagnosis not present

## 2017-03-05 DIAGNOSIS — J449 Chronic obstructive pulmonary disease, unspecified: Secondary | ICD-10-CM

## 2017-03-05 DIAGNOSIS — Z87891 Personal history of nicotine dependence: Secondary | ICD-10-CM

## 2017-03-05 DIAGNOSIS — R1310 Dysphagia, unspecified: Secondary | ICD-10-CM | POA: Diagnosis not present

## 2017-03-05 DIAGNOSIS — C021 Malignant neoplasm of border of tongue: Secondary | ICD-10-CM

## 2017-03-05 DIAGNOSIS — Z8673 Personal history of transient ischemic attack (TIA), and cerebral infarction without residual deficits: Secondary | ICD-10-CM | POA: Diagnosis not present

## 2017-03-05 MED ORDER — ONDANSETRON HCL 8 MG PO TABS: 8.0000 mg | ORAL_TABLET | Freq: Three times a day (TID) | ORAL | 1 refills | Status: AC | PRN

## 2017-03-05 MED ORDER — LIDOCAINE-PRILOCAINE 2.5-2.5 % EX CREA: TOPICAL_CREAM | CUTANEOUS | 3 refills | Status: AC

## 2017-03-05 MED ORDER — PROCHLORPERAZINE MALEATE 10 MG PO TABS: 10.0000 mg | ORAL_TABLET | Freq: Four times a day (QID) | ORAL | 1 refills | Status: AC | PRN

## 2017-03-05 MED FILL — LIDOCAINE-PRILOCAINE CREAM: 2.5-2.5 | 10 days supply | Qty: 30 | Fill #0

## 2017-03-05 MED FILL — PROCHLORPERAZINE 10 MG TAB: 10 | 7 days supply | Qty: 30 | Fill #0

## 2017-03-05 MED FILL — ONDANSETRON HCL 8 MG TABLET: 8 | 10 days supply | Qty: 30 | Fill #0

## 2017-03-05 NOTE — Assessment & Plan Note (Signed)
He has significant dysphagia since surgery He will be evaluated by speech and language therapist next week I recommend he try some soft diet as tolerated and we discussed strategies to increase nutritional supplement now

## 2017-03-05 NOTE — Assessment & Plan Note (Signed)
The patient had remote history of stroke and heart attack without any residual deficit He has discontinued all his medications due to inability to swallow I recommend he resume aspirin therapy

## 2017-03-05 NOTE — Assessment & Plan Note (Signed)
He has profound weight loss, almost 30 pounds since surgery He is slowly gaining weight We discussed the importance of increasing nutritional supplement as tolerated
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
## 2017-03-05 NOTE — Progress Notes (Signed)
START ON PATHWAY REGIMEN - Head and Neck     Administer weekly:     Cisplatin   **Always confirm dose/schedule in your pharmacy ordering system**    Patient Characteristics: Oral Cavity, Stage III, IVA, IVB; Unresectable Disease Classification: Oral Cavity AJCC T Category: T3 Current Disease Status: No Distant Metastases and No Recurrent Disease AJCC M Category: M0 AJCC N Category: pN3 AJCC 8 Stage Grouping: IVB  Intent of Therapy: Curative Intent, Discussed with Patient

## 2017-03-05 NOTE — Assessment & Plan Note (Signed)
I discussed with the patient and family members to rationale for concurrent chemoradiation therapy. In preparation for treatment, he would need multidisciplinary clinic to see dietitian, speech and language therapist and physical therapy along with chemotherapy education class We will also obtain baseline hearing test. I recommend concurrent chemoradiation therapy with weekly cisplatin.  With recent profound weight loss, I do not believe he would be able to tolerate high-dose cisplatin but given poor prognostic features, I believe cisplatin is more beneficial than cetuximab I will get the ENT navigator to coordinate appointments  We will start chemotherapy next week and I will see him on a weekly basis with blood draw, history and physical examination We discussed the role of chemotherapy. The intent is of curative intent.  We discussed some of the risks, benefits, side-effects of cisplatin  Some of the short term side-effects included, though not limited to, including weight loss, life threatening infections, risk of allergic reactions, need for transfusions of blood products, nausea, vomiting, change in bowel habits, loss of hair, admission to hospital for various reasons, and risks of death.   Long term side-effects are also discussed including risks of infertility, permanent damage to nerve function, hearing loss, chronic fatigue, kidney damage with possibility needing hemodialysis, and rare secondary malignancy including bone marrow disorders.  The patient is aware that the response rates discussed earlier is not guaranteed.  After a long discussion, patient made an informed decision to proceed with the prescribed plan of care.   Patient education material was dispensed.

## 2017-03-05 NOTE — Telephone Encounter (Signed)
Appointments scheduled per 4.3.18 LOS. Patient given AVS report and calendars with future scheduled appointments.  °

## 2017-03-07 ENCOUNTER — Ambulatory Visit: Payer: Medicare PPO

## 2017-03-07 ENCOUNTER — Encounter: Payer: Self-pay | Admitting: *Deleted

## 2017-03-07 NOTE — Progress Notes (Signed)
Oncology Nurse Navigator Documentation  Met with Mr. Orvis during Established Patient appt with Dr. Alvy Bimler.  He was accompanied by his wife. He reported:  Showed PEG placed 3/30, not standard pull-through rather a narrower ID PEG.  He indicated gravity instillation of water/supplement not feasible, use of syringe plunger required.  (Procedure note indicated direct puncture PEG placed d/t recent surgery (L partial glossectomy 2/26)).  Oral intake of liquids, no difficulty swallowing.  Unable to chew food b/c edentulous.  Dr. Alvy Bimler encouraged try soft foods eg eggs, yogurt. They voiced understanding of proposed weekly Cisplatin to start 03/15/17.  Mr. Steege understands I will coordinate baseline audiology exam. They understand to contact me with needs/concerns.  Gayleen Orem, RN, BSN, [place] [facility] Oncology Nurse [place] [facility] [place] [phone]

## 2017-03-08 ENCOUNTER — Ambulatory Visit: Payer: Medicare PPO

## 2017-03-11 ENCOUNTER — Telehealth: Payer: Self-pay | Admitting: *Deleted

## 2017-03-11 ENCOUNTER — Ambulatory Visit: Payer: No Typology Code available for payment source | Admitting: Hematology and Oncology

## 2017-03-11 ENCOUNTER — Ambulatory Visit: Payer: Medicare PPO

## 2017-03-11 DIAGNOSIS — Z510 Encounter for antineoplastic radiation therapy: Secondary | ICD-10-CM | POA: Diagnosis not present

## 2017-03-11 NOTE — Telephone Encounter (Signed)
Oncology Nurse Navigator Documentation  Sent fax to [facility] requesting baseline audiology, requested call/email when appt scheduled, fax copy of report.  'Successful TX Notice' received.  Received email indicating exam scheduled for this Wed 1:00.  Dr. Alvy Bimler notified.  Gayleen Orem, RN, BSN, [place] [facility] Oncology Nurse [place] [facility] [place] [phone]

## 2017-03-12 ENCOUNTER — Encounter: Payer: Self-pay | Admitting: *Deleted

## 2017-03-12 ENCOUNTER — Other Ambulatory Visit: Payer: No Typology Code available for payment source

## 2017-03-12 ENCOUNTER — Other Ambulatory Visit (HOSPITAL_BASED_OUTPATIENT_CLINIC_OR_DEPARTMENT_OTHER): Payer: No Typology Code available for payment source

## 2017-03-12 ENCOUNTER — Ambulatory Visit
Admission: RE | Admit: 2017-03-12 | Discharge: 2017-03-12 | Disposition: A | Discharge status: Home / Self Care | Payer: Medicare PPO | Source: Ambulatory Visit | Attending: Radiation Oncology | Admitting: Radiation Oncology

## 2017-03-12 ENCOUNTER — Ambulatory Visit: Payer: No Typology Code available for payment source | Attending: Radiation Oncology

## 2017-03-12 ENCOUNTER — Other Ambulatory Visit: Payer: Self-pay | Admitting: Hematology and Oncology

## 2017-03-12 ENCOUNTER — Ambulatory Visit: Payer: Medicare PPO

## 2017-03-12 ENCOUNTER — Ambulatory Visit: Payer: No Typology Code available for payment source | Admitting: Physical Therapy

## 2017-03-12 ENCOUNTER — Ambulatory Visit: Payer: No Typology Code available for payment source | Admitting: Nutrition

## 2017-03-12 DIAGNOSIS — Z483 Aftercare following surgery for neoplasm: Secondary | ICD-10-CM | POA: Insufficient documentation

## 2017-03-12 DIAGNOSIS — M25611 Stiffness of right shoulder, not elsewhere classified: Secondary | ICD-10-CM | POA: Diagnosis present

## 2017-03-12 DIAGNOSIS — C021 Malignant neoplasm of border of tongue: Secondary | ICD-10-CM

## 2017-03-12 DIAGNOSIS — Z510 Encounter for antineoplastic radiation therapy: Secondary | ICD-10-CM | POA: Diagnosis not present

## 2017-03-12 DIAGNOSIS — M25612 Stiffness of left shoulder, not elsewhere classified: Secondary | ICD-10-CM | POA: Insufficient documentation

## 2017-03-12 DIAGNOSIS — I890 Lymphedema, not elsewhere classified: Secondary | ICD-10-CM | POA: Diagnosis present

## 2017-03-12 DIAGNOSIS — R1310 Dysphagia, unspecified: Secondary | ICD-10-CM | POA: Insufficient documentation

## 2017-03-12 LAB — CBC WITH DIFFERENTIAL/PLATELET
BASO%: 1 % (ref 0.0–2.0)
BASOS ABS: 0.1 10*3/uL (ref 0.0–0.1)
EOS ABS: 0.3 10*3/uL (ref 0.0–0.5)
EOS%: 3.3 % (ref 0.0–7.0)
HCT: 42.1 % (ref 38.4–49.9)
HEMOGLOBIN: 14.2 g/dL (ref 13.0–17.1)
LYMPH%: 16.8 % (ref 14.0–49.0)
MCH: 32.4 pg (ref 27.2–33.4)
MCHC: 33.8 g/dL (ref 32.0–36.0)
MCV: 96 fL (ref 79.3–98.0)
MONO#: 1.3 10*3/uL — ABNORMAL HIGH (ref 0.1–0.9)
MONO%: 12.9 % (ref 0.0–14.0)
NEUT#: 6.5 10*3/uL (ref 1.5–6.5)
NEUT%: 66 % (ref 39.0–75.0)
Platelets: 319 10*3/uL (ref 140–400)
RBC: 4.39 10*6/uL (ref 4.20–5.82)
RDW: 12.8 % (ref 11.0–14.6)
WBC: 9.8 10*3/uL (ref 4.0–10.3)
lymph#: 1.6 10*3/uL (ref 0.9–3.3)

## 2017-03-12 LAB — COMPREHENSIVE METABOLIC PANEL
ALBUMIN: 3.8 g/dL (ref 3.5–5.0)
ALK PHOS: 103 U/L (ref 40–150)
ALT: 24 U/L (ref 0–55)
AST: 23 U/L (ref 5–34)
Anion Gap: 12 mEq/L — ABNORMAL HIGH (ref 3–11)
BILIRUBIN TOTAL: 0.64 mg/dL (ref 0.20–1.20)
BUN: 12.4 mg/dL (ref 7.0–26.0)
CO2: 25 meq/L (ref 22–29)
CREATININE: 0.8 mg/dL (ref 0.7–1.3)
Calcium: 10.5 mg/dL — ABNORMAL HIGH (ref 8.4–10.4)
Chloride: 100 mEq/L (ref 98–109)
GLUCOSE: 98 mg/dL (ref 70–140)
Potassium: 5 mEq/L (ref 3.5–5.1)
SODIUM: 137 meq/L (ref 136–145)
TOTAL PROTEIN: 8.7 g/dL — AB (ref 6.4–8.3)

## 2017-03-12 LAB — MAGNESIUM: Magnesium: 2 mg/dl (ref 1.5–2.5)
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
## 2017-03-12 NOTE — Progress Notes (Signed)
Head & Neck Multidisciplinary Clinic Clinical Social Work  Clinical Social Work met with patient/family at head & neck multidisciplinary clinic to offer support and assess for psychosocial needs. Mr. Moga was accompanied by his spouse for today's visit.  The patient shared his only concern was finishing treatment and spouse indicated patient unable to eat/using feeding tube as a concern.  They shared their frustrations with beginning treatment due to [place] authorization delay.  Mr. Goodwyn shared when he's feeling sad/overwhelmed/anxious he internalizes his feelings.  Patient's spouse shared she is familiar with this and "knows when to check on him".  CSW discussed importance of sharing emotions through treatment and receiving support.    ONCBCN DISTRESS SCREENING 02/22/2017  Screening Type Initial Screening  Distress experienced in past week (1-10) 5  Practical problem type Food  Emotional problem type Adjusting to illness  Spiritual/Religous concerns type Facing my mortality  Physical Problem type Mouth sores/swallowing;Talking;Tingling hands/feet  Physician notified of physical symptoms Yes    Clinical Social Work briefly discussed Clinical Social Work role and [place] support programs/services.  Clinical Social Work encouraged patient to call with any additional questions or concerns.   Maryjean Morn, MSW, LCSW, OSW-C Clinical Social Worker [facility] [phone]

## 2017-03-12 NOTE — Patient Instructions (Signed)
SWALLOWING EXERCISES Do these 6 of the 7 days per week until 6 months after your last rad day, then 2-3 times per week afterwards  1. Effortful Swallows - Press your tongue against the roof of your mouth for 3 seconds, then squeeze  the muscles in your neck while you swallow your saliva or a sip of water - Repeat 20 times, 2-3 times a day, and use whenever you eat or drink  2. Masako Swallow - swallow with your tongue sticking out - Stick tongue out past your teeth and gently bite tongue with your teeth - Swallow, while holding your tongue with your teeth - Repeat 20 times, 2-3 times a day *use a wet spoon if your mouth gets dry*  3. Pitch Raise - Repeat "he", once per second in as high of a pitch as you can - Repeat 20 times, 2-3 times a day  4. Shaker Exercise - head lift - Lie flat on your back in your bed or on a couch without pillows - Raise your head and look at your feet - KEEP YOUR SHOULDERS DOWN - HOLD FOR 45-60 SECONDS, then lower your head back down - Repeat 3 times, 2-3 times a day  5. Mendelsohn Maneuver - "half swallow" exercise - Start to swallow, and keep your Adam's apple up by squeezing hard with the            muscles of the throat - Hold the squeeze for 5-7 seconds and then relax - Repeat 20 times, 2-3 times a day *use a wet spoon if your mouth gets dry*  6. Tongue Stretch/Teeth Clean - Move your tongue around the pocket between your gums and teeth, clockwise and then counter-clockwise - Repeat on the back side, clockwise and then counter-clockwise - Repeat 15-20 times, 2-3 times a day  7. Breath Hold - Say "HUH!" loudly, then hold your breath for 3 seconds at your voice box - Repeat 20 times, 2-3 times a day  8. Chin pushback - Open your mouth  - Place your fist UNDER your chin near your neck, and push back with your fist for 5 seconds - Repeat 10 times, 2-3 times a day

## 2017-03-12 NOTE — Progress Notes (Signed)
Patient was seen during Head and Neck Clinic.  66 year old male diagnosed with tongue cancer, status post left partial glossectomy and reconstruction.  Past medical history includes TIA, stroke, PAD, MI, hypertension, hyperlipidemia, diverticulosis.  Medications include Lipitor, vitamin D, ferrous sulfate, multivitamin, Prilosec, probiotics.  Labs were reviewed.  Height: 5 feet 8 inches. Weight: 166.3 pounds on April 3. Usual body weight: 190 pounds. BMI: 25.29.  Patient endorses approximate 35 pound weight loss since January. He is edentulous. He drinks two milkshakes daily by mouth. He receives tube feeding formula through the [facility] and is utilizing Nutren 1.5, 3-4 bottles daily with 60 mL free water before and after bolus feedings. He has some type of protein powder which he uses once daily.  He is not sure of the name or nutritional content. He will be receiving weekly cisplatin and concurrent radiation therapy.  Nutrition diagnosis:  Unintended weight loss related to new diagnosis of tongue cancer as evidenced by 13% weight loss since January 2018.  Estimated Nutrition Needs: 2130-2330 kcal, 95-105 grams protein, 2.3 L fluid.  Intervention: Patient educated to continue oral intake as tolerated.  I provided samples of oral nutrition supplements and recommended soft moist protein foods. Recommended patient utilize Nutren 1.5 via PEG, one bottle 4 times a day with 60 cc free water before and after bolus feedings.  This provides 1500 cal, 68 g protein, 1244 mL free water.  Will increase Nutren 1.5 to 6 bottles daily when oral intake decreases to provide 2250 kcal, 102 grams protein, 1626 mL free water. Patient will require an additional 675 mL free water daily to meet minimum free water requirements.  Monitoring, Evaluation, Goals: Patient will tolerate adequate calories and protein to meet greater than 90% estimated needs.  Next Visit:Friday, April 20, during  infusion.  **Disclaimer: This note was dictated with voice recognition software. Similar sounding words can inadvertently be transcribed and this note may contain transcription errors which may not have been corrected upon publication of note.**
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
## 2017-03-13 ENCOUNTER — Ambulatory Visit
Admission: RE | Admit: 2017-03-13 | Discharge: 2017-03-13 | Disposition: A | Discharge status: Home / Self Care | Payer: Medicare PPO | Source: Ambulatory Visit | Attending: Radiation Oncology | Admitting: Radiation Oncology

## 2017-03-13 DIAGNOSIS — C021 Malignant neoplasm of border of tongue: Secondary | ICD-10-CM

## 2017-03-13 DIAGNOSIS — Z510 Encounter for antineoplastic radiation therapy: Secondary | ICD-10-CM | POA: Diagnosis not present

## 2017-03-13 MED ORDER — BIAFINE EX EMUL
CUTANEOUS | Status: DC | PRN
  Administered 2017-03-13: 15:00:00 via TOPICAL

## 2017-03-13 NOTE — Progress Notes (Signed)

## 2017-03-14 ENCOUNTER — Telehealth: Payer: Self-pay | Admitting: *Deleted

## 2017-03-14 ENCOUNTER — Ambulatory Visit
Admission: RE | Admit: 2017-03-14 | Discharge: 2017-03-14 | Disposition: A | Discharge status: Home / Self Care | Payer: Medicare PPO | Source: Ambulatory Visit | Attending: Radiation Oncology | Admitting: Radiation Oncology

## 2017-03-14 DIAGNOSIS — Z510 Encounter for antineoplastic radiation therapy: Secondary | ICD-10-CM | POA: Diagnosis not present

## 2017-03-14 NOTE — Progress Notes (Signed)
Oncology Nurse Navigator Documentation  Met with Mr. Soberano during [facility] [place].  He was accompanied by his wife.  Arrived him to Nursing, measured/recorded vitals, provided verbal and written overview of [place], the clinicians who will be seeing him, encouraged him to ask questions during his time with them.  He was seen by Nutrition, SLP, PT, SW and [place].  Spoke with him at end of [facility], addressed questions. He understands I can be contacted with needs/concerns.  Gayleen Orem, RN, BSN, [place] at [place] [phone]

## 2017-03-14 NOTE — Telephone Encounter (Signed)
Oncology Nurse Navigator Documentation  Called patient wife in follow-up to her call to assure/confirm authorization for chemotherapy has been received from the [place].  She expressed appreciation.  Gayleen Orem, RN, BSN, [place] Oncology Nurse [place] at [place] [phone]

## 2017-03-15 ENCOUNTER — Ambulatory Visit (HOSPITAL_BASED_OUTPATIENT_CLINIC_OR_DEPARTMENT_OTHER): Payer: No Typology Code available for payment source

## 2017-03-15 ENCOUNTER — Ambulatory Visit
Admission: RE | Admit: 2017-03-15 | Discharge: 2017-03-15 | Disposition: A | Discharge status: Home / Self Care | Payer: Medicare PPO | Source: Ambulatory Visit | Attending: Radiation Oncology | Admitting: Radiation Oncology

## 2017-03-15 ENCOUNTER — Other Ambulatory Visit: Payer: Self-pay | Admitting: Hematology and Oncology

## 2017-03-15 ENCOUNTER — Other Ambulatory Visit: Payer: Self-pay | Admitting: *Deleted

## 2017-03-15 DIAGNOSIS — Z510 Encounter for antineoplastic radiation therapy: Secondary | ICD-10-CM | POA: Diagnosis not present

## 2017-03-15 DIAGNOSIS — Z95828 Presence of other vascular implants and grafts: Secondary | ICD-10-CM

## 2017-03-15 DIAGNOSIS — R070 Pain in throat: Secondary | ICD-10-CM | POA: Diagnosis not present

## 2017-03-15 DIAGNOSIS — Z5111 Encounter for antineoplastic chemotherapy: Secondary | ICD-10-CM | POA: Diagnosis not present

## 2017-03-15 DIAGNOSIS — C021 Malignant neoplasm of border of tongue: Secondary | ICD-10-CM

## 2017-03-15 MED ORDER — SODIUM CHLORIDE 0.9 % IV SOLN
Freq: Once | INTRAVENOUS | Status: AC
  Administered 2017-03-15: 11:00:00 via INTRAVENOUS
  Filled 2017-03-15: qty 5

## 2017-03-15 MED ORDER — HYDROMORPHONE HCL 4 MG/ML IJ SOLN
1.0000 mg | INTRAMUSCULAR | Status: DC | PRN
  Administered 2017-03-15 (×3): 1 mg via INTRAVENOUS

## 2017-03-15 MED ORDER — PALONOSETRON HCL INJECTION 0.25 MG/5ML
INTRAVENOUS | Status: AC
  Filled 2017-03-15: qty 5

## 2017-03-15 MED ORDER — HYDROMORPHONE HCL 4 MG/ML IJ SOLN
INTRAMUSCULAR | Status: AC
  Filled 2017-03-15: qty 1

## 2017-03-15 MED ORDER — SODIUM CHLORIDE 0.9 % IV SOLN
Freq: Once | INTRAVENOUS | Status: AC
  Administered 2017-03-15: 08:00:00 via INTRAVENOUS

## 2017-03-15 MED ORDER — POTASSIUM CHLORIDE 2 MEQ/ML IV SOLN
Freq: Once | INTRAVENOUS | Status: AC
  Administered 2017-03-15: 09:00:00 via INTRAVENOUS
  Filled 2017-03-15: qty 10

## 2017-03-15 MED ORDER — PALONOSETRON HCL INJECTION 0.25 MG/5ML
0.2500 mg | Freq: Once | INTRAVENOUS | Status: AC
  Administered 2017-03-15: 0.25 mg via INTRAVENOUS

## 2017-03-15 MED ORDER — SODIUM CHLORIDE 0.9% FLUSH
10.0000 mL | INTRAVENOUS | Status: DC | PRN
  Administered 2017-03-15: 10 mL
  Filled 2017-03-15: qty 10

## 2017-03-15 MED ORDER — MAGIC MOUTHWASH W/LIDOCAINE: 5.0000 mL | Freq: Four times a day (QID) | ORAL | 0 refills | Status: DC | PRN

## 2017-03-15 MED ORDER — SODIUM CHLORIDE 0.9 % IV SOLN
40.0000 mg/m2 | Freq: Once | INTRAVENOUS | Status: AC
  Administered 2017-03-15: 76 mg via INTRAVENOUS
  Filled 2017-03-15: qty 76

## 2017-03-15 MED ORDER — HEPARIN SOD (PORK) LOCK FLUSH 100 UNIT/ML IV SOLN
500.0000 [IU] | Freq: Once | INTRAVENOUS | Status: AC | PRN
  Administered 2017-03-15: 500 [IU]
  Filled 2017-03-15: qty 5

## 2017-03-15 MED FILL — MAGIC MOUTHWASH W/LIDO 1:1: 12 days supply | Qty: 240 | Fill #0

## 2017-03-15 NOTE — Patient Instructions (Signed)
[employer] Discharge Instructions for Patients Receiving Chemotherapy  Today you received the following chemotherapy agents Cisplatin   To help prevent nausea and vomiting after your treatment, we encourage you to take your nausea medication as directed. No Zofran for 3 days. Take Compazine instead.    If you develop nausea and vomiting that is not controlled by your nausea medication, call the clinic.   BELOW ARE SYMPTOMS THAT SHOULD BE REPORTED IMMEDIATELY:  *FEVER GREATER THAN 100.5 F  *CHILLS WITH OR WITHOUT FEVER  NAUSEA AND VOMITING THAT IS NOT CONTROLLED WITH YOUR NAUSEA MEDICATION  *UNUSUAL SHORTNESS OF BREATH  *UNUSUAL BRUISING OR BLEEDING  TENDERNESS IN MOUTH AND THROAT WITH OR WITHOUT PRESENCE OF ULCERS  *URINARY PROBLEMS  *BOWEL PROBLEMS  UNUSUAL RASH Items with * indicate a potential emergency and should be followed up as soon as possible.  Feel free to call the clinic you have any questions or concerns. The clinic phone number is [phone].  Please show the [place] at check-in to the Emergency Department and triage nurse.  Cisplatin injection What is this medicine? CISPLATIN (SIS pla tin) is a chemotherapy drug. It targets fast dividing cells, like cancer cells, and causes these cells to die. This medicine is used to treat many types of cancer like bladder, ovarian, and testicular cancers. This medicine may be used for other purposes; ask your health care provider or pharmacist if you have questions. COMMON BRAND NAME(S): Platinol, Platinol -AQ What should I tell my health care provider before I take this medicine? They need to know if you have any of these conditions: -blood disorders -hearing problems -kidney disease -recent or ongoing radiation therapy -an unusual or allergic reaction to cisplatin, carboplatin, other chemotherapy, other medicines, foods, dyes, or preservatives -pregnant or trying to get  pregnant -breast-feeding How should I use this medicine? This drug is given as an infusion into a vein. It is administered in a hospital or clinic by a specially trained health care professional. Talk to your pediatrician regarding the use of this medicine in children. Special care may be needed. Overdosage: If you think you have taken too much of this medicine contact a poison control center or emergency room at once. NOTE: This medicine is only for you. Do not share this medicine with others. What if I miss a dose? It is important not to miss a dose. Call your doctor or health care professional if you are unable to keep an appointment. What may interact with this medicine? -dofetilide -foscarnet -medicines for seizures -medicines to increase blood counts like filgrastim, pegfilgrastim, sargramostim -probenecid -pyridoxine used with altretamine -rituximab -some antibiotics like amikacin, gentamicin, neomycin, polymyxin B, streptomycin, tobramycin -sulfinpyrazone -vaccines -zalcitabine Talk to your doctor or health care professional before taking any of these medicines: -acetaminophen -aspirin -ibuprofen -ketoprofen -naproxen This list may not describe all possible interactions. Give your health care provider a list of all the medicines, herbs, non-prescription drugs, or dietary supplements you use. Also tell them if you smoke, drink alcohol, or use illegal drugs. Some items may interact with your medicine. What should I watch for while using this medicine? Your condition will be monitored carefully while you are receiving this medicine. You will need important blood work done while you are taking this medicine. This drug may make you feel generally unwell. This is not uncommon, as chemotherapy can affect healthy cells as well as cancer cells. Report any side effects. Continue your course of treatment even though you  feel ill unless your doctor tells you to stop. In some cases, you may  be given additional medicines to help with side effects. Follow all directions for their use. Call your doctor or health care professional for advice if you get a fever, chills or sore throat, or other symptoms of a cold or flu. Do not treat yourself. This drug decreases your body's ability to fight infections. Try to avoid being around people who are sick. This medicine may increase your risk to bruise or bleed. Call your doctor or health care professional if you notice any unusual bleeding. Be careful brushing and flossing your teeth or using a toothpick because you may get an infection or bleed more easily. If you have any dental work done, tell your dentist you are receiving this medicine. Avoid taking products that contain aspirin, acetaminophen, ibuprofen, naproxen, or ketoprofen unless instructed by your doctor. These medicines may hide a fever. Do not become pregnant while taking this medicine. Women should inform their doctor if they wish to become pregnant or think they might be pregnant. There is a potential for serious side effects to an unborn child. Talk to your health care professional or pharmacist for more information. Do not breast-feed an infant while taking this medicine. Drink fluids as directed while you are taking this medicine. This will help protect your kidneys. Call your doctor or health care professional if you get diarrhea. Do not treat yourself. What side effects may I notice from receiving this medicine? Side effects that you should report to your doctor or health care professional as soon as possible: -allergic reactions like skin rash, itching or hives, swelling of the face, lips, or tongue -signs of infection - fever or chills, cough, sore throat, pain or difficulty passing urine -signs of decreased platelets or bleeding - bruising, pinpoint red spots on the skin, black, tarry stools, nosebleeds -signs of decreased red blood cells - unusually weak or tired, fainting  spells, lightheadedness -breathing problems -changes in hearing -gout pain -low blood counts - This drug may decrease the number of white blood cells, red blood cells and platelets. You may be at increased risk for infections and bleeding. -nausea and vomiting -pain, swelling, redness or irritation at the injection site -pain, tingling, numbness in the hands or feet -problems with balance, movement -trouble passing urine or change in the amount of urine Side effects that usually do not require medical attention (report to your doctor or health care professional if they continue or are bothersome): -changes in vision -loss of appetite -metallic taste in the mouth or changes in taste This list may not describe all possible side effects. Call your doctor for medical advice about side effects. You may report side effects to FDA at 1-800-FDA-1088. Where should I keep my medicine? This drug is given in a hospital or clinic and will not be stored at home. NOTE: This sheet is a summary. It may not cover all possible information. If you have questions about this medicine, talk to your doctor, pharmacist, or health care provider.  2018 Elsevier/Gold Standard (2008-02-24 14:40:54)

## 2017-03-15 NOTE — Progress Notes (Signed)
Patient c/o pain and difficulty with swallowing that started 2 days ago (see flowsheets). MD Alvy Bimler notified. Per MD Alvy Bimler ok to give IV dilauded (see eMAR).  Prescription for magic mouthwash sent to pharmacy per MD Alvy Bimler. Patient verbalizes understanding that he will need to pick up magic mouth wash from pharmacy. Patient agrees with plan of care.

## 2017-03-18 ENCOUNTER — Other Ambulatory Visit: Payer: Self-pay | Admitting: Radiation Oncology

## 2017-03-18 ENCOUNTER — Ambulatory Visit: Admission: RE | Admit: 2017-03-18 | Payer: Medicare PPO | Source: Ambulatory Visit

## 2017-03-18 ENCOUNTER — Telehealth: Payer: Self-pay | Admitting: *Deleted

## 2017-03-18 MED ORDER — DEXAMETHASONE 4 MG PO TABS: ORAL_TABLET | ORAL | 0 refills | Status: DC

## 2017-03-18 MED FILL — DEXAMETHASONE 4 MG TABLET: 4 | 20 days supply | Qty: 60 | Fill #0

## 2017-03-18 NOTE — Telephone Encounter (Signed)
-----   Message from Veverly Fells, RN sent at 03/15/2017  3:53 PM EDT ----- Regarding: First Time Follow-up: MD Alvy Bimler  Contact: [phone] [name], a patient of MD Alvy Bimler, received Cisplatin for the first time today. He tolerated well with no complaints. Dilauded given X 3 for throat pain from radiation

## 2017-03-18 NOTE — Telephone Encounter (Signed)
Wife states neck swollen and mask did not fit today for RT.  Is using feeding tube, slight nausea. Drank a milkshake. No questions or concerns

## 2017-03-19 ENCOUNTER — Ambulatory Visit
Admission: RE | Admit: 2017-03-19 | Discharge: 2017-03-19 | Disposition: A | Discharge status: Home / Self Care | Payer: Medicare PPO | Source: Ambulatory Visit | Attending: Radiation Oncology | Admitting: Radiation Oncology

## 2017-03-19 DIAGNOSIS — Z510 Encounter for antineoplastic radiation therapy: Secondary | ICD-10-CM | POA: Diagnosis not present

## 2017-03-20 ENCOUNTER — Ambulatory Visit
Admission: RE | Admit: 2017-03-20 | Discharge: 2017-03-20 | Disposition: A | Discharge status: Home / Self Care | Payer: Medicare PPO | Source: Ambulatory Visit | Attending: Radiation Oncology | Admitting: Radiation Oncology

## 2017-03-20 ENCOUNTER — Other Ambulatory Visit (HOSPITAL_BASED_OUTPATIENT_CLINIC_OR_DEPARTMENT_OTHER): Payer: No Typology Code available for payment source

## 2017-03-20 ENCOUNTER — Ambulatory Visit: Payer: No Typology Code available for payment source | Admitting: Physical Therapy

## 2017-03-20 DIAGNOSIS — C021 Malignant neoplasm of border of tongue: Secondary | ICD-10-CM | POA: Diagnosis not present

## 2017-03-20 DIAGNOSIS — R1310 Dysphagia, unspecified: Secondary | ICD-10-CM | POA: Diagnosis not present

## 2017-03-20 DIAGNOSIS — I890 Lymphedema, not elsewhere classified: Secondary | ICD-10-CM

## 2017-03-20 DIAGNOSIS — Z510 Encounter for antineoplastic radiation therapy: Secondary | ICD-10-CM | POA: Diagnosis not present

## 2017-03-20 DIAGNOSIS — M25611 Stiffness of right shoulder, not elsewhere classified: Secondary | ICD-10-CM

## 2017-03-20 DIAGNOSIS — Z483 Aftercare following surgery for neoplasm: Secondary | ICD-10-CM

## 2017-03-20 DIAGNOSIS — M25612 Stiffness of left shoulder, not elsewhere classified: Secondary | ICD-10-CM

## 2017-03-20 LAB — COMPREHENSIVE METABOLIC PANEL
ALBUMIN: 3.4 g/dL — AB (ref 3.5–5.0)
ALK PHOS: 81 U/L (ref 40–150)
ALT: 34 U/L (ref 0–55)
AST: 31 U/L (ref 5–34)
Anion Gap: 11 mEq/L (ref 3–11)
BILIRUBIN TOTAL: 0.76 mg/dL (ref 0.20–1.20)
BUN: 22 mg/dL (ref 7.0–26.0)
CO2: 25 meq/L (ref 22–29)
CREATININE: 0.7 mg/dL (ref 0.7–1.3)
Calcium: 9.7 mg/dL (ref 8.4–10.4)
Chloride: 98 mEq/L (ref 98–109)
GLUCOSE: 102 mg/dL (ref 70–140)
Potassium: 4.6 mEq/L (ref 3.5–5.1)
SODIUM: 134 meq/L — AB (ref 136–145)
TOTAL PROTEIN: 7.6 g/dL (ref 6.4–8.3)

## 2017-03-20 LAB — CBC WITH DIFFERENTIAL/PLATELET
BASO%: 0.2 % (ref 0.0–2.0)
BASOS ABS: 0 10*3/uL (ref 0.0–0.1)
EOS%: 0.6 % (ref 0.0–7.0)
Eosinophils Absolute: 0.1 10*3/uL (ref 0.0–0.5)
HEMATOCRIT: 38.5 % (ref 38.4–49.9)
HEMOGLOBIN: 12.8 g/dL — AB (ref 13.0–17.1)
LYMPH%: 5.2 % — ABNORMAL LOW (ref 14.0–49.0)
MCH: 31.5 pg (ref 27.2–33.4)
MCHC: 33.2 g/dL (ref 32.0–36.0)
MCV: 94.8 fL (ref 79.3–98.0)
MONO#: 0.6 10*3/uL (ref 0.1–0.9)
MONO%: 5.1 % (ref 0.0–14.0)
NEUT#: 11 10*3/uL — ABNORMAL HIGH (ref 1.5–6.5)
NEUT%: 88.9 % — AB (ref 39.0–75.0)
Platelets: 329 10*3/uL (ref 140–400)
RBC: 4.06 10*6/uL — ABNORMAL LOW (ref 4.20–5.82)
RDW: 12.5 % (ref 11.0–14.6)
WBC: 12.4 10*3/uL — ABNORMAL HIGH (ref 4.0–10.3)
lymph#: 0.7 10*3/uL — ABNORMAL LOW (ref 0.9–3.3)

## 2017-03-20 LAB — MAGNESIUM: Magnesium: 2 mg/dl (ref 1.5–2.5)
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
## 2017-03-21 ENCOUNTER — Ambulatory Visit (HOSPITAL_BASED_OUTPATIENT_CLINIC_OR_DEPARTMENT_OTHER): Payer: No Typology Code available for payment source | Admitting: Hematology and Oncology

## 2017-03-21 ENCOUNTER — Ambulatory Visit
Admission: RE | Admit: 2017-03-21 | Discharge: 2017-03-21 | Disposition: A | Discharge status: Home / Self Care | Payer: Medicare PPO | Source: Ambulatory Visit | Attending: Radiation Oncology | Admitting: Radiation Oncology

## 2017-03-21 ENCOUNTER — Encounter: Payer: Self-pay | Admitting: Hematology and Oncology

## 2017-03-21 VITALS — BP 135/81 | HR 84 | Resp 18 | Ht 68.0 in | Wt 160.0 lb

## 2017-03-21 DIAGNOSIS — C021 Malignant neoplasm of border of tongue: Secondary | ICD-10-CM

## 2017-03-21 DIAGNOSIS — D6481 Anemia due to antineoplastic chemotherapy: Secondary | ICD-10-CM | POA: Diagnosis not present

## 2017-03-21 DIAGNOSIS — R634 Abnormal weight loss: Secondary | ICD-10-CM | POA: Diagnosis not present

## 2017-03-21 DIAGNOSIS — L7682 Other postprocedural complications of skin and subcutaneous tissue: Secondary | ICD-10-CM | POA: Insufficient documentation

## 2017-03-21 DIAGNOSIS — Z510 Encounter for antineoplastic radiation therapy: Secondary | ICD-10-CM | POA: Diagnosis not present

## 2017-03-21 DIAGNOSIS — T451X5A Adverse effect of antineoplastic and immunosuppressive drugs, initial encounter: Secondary | ICD-10-CM

## 2017-03-21 MED ORDER — FENTANYL 25 MCG/HR TD PT72: 25.0000 ug | MEDICATED_PATCH | TRANSDERMAL | 0 refills | Status: DC

## 2017-03-21 MED ORDER — MORPHINE SULFATE (CONCENTRATE) 20 MG/ML PO SOLN: 10.0000 mg | ORAL | 0 refills | Status: DC | PRN

## 2017-03-21 MED FILL — MORPHINE SULF 100 MG/5 ML S: 100 | 20 days supply | Qty: 120 | Fill #0

## 2017-03-21 MED FILL — fentaNYL 25 MCG/HR PT72: 25 | 15 days supply | Qty: 5 | Fill #0

## 2017-03-21 NOTE — Assessment & Plan Note (Signed)
This is likely due to recent treatment. The patient denies recent history of bleeding such as epistaxis, hematuria or hematochezia. He is asymptomatic from the anemia. I will observe for now.  He does not require transfusion now. I will continue the chemotherapy at current dose without dosage adjustment.  If the anemia gets progressive worse in the future, I might have to delay his treatment or adjust the chemotherapy dose.  

## 2017-03-21 NOTE — Assessment & Plan Note (Signed)
He has mild pain from recent surgery We discussed narcotic prescription refill policy I will start him on some liquid morphine sulfate to take as needed

## 2017-03-21 NOTE — Assessment & Plan Note (Signed)
He tolerated chemotherapy side effects well We will continue weekly treatment and supportive care visits as scheduled

## 2017-03-21 NOTE — Assessment & Plan Note (Signed)
He has progressive weight loss since surgery We discussed the importance of increasing nutritional supplement as tolerated

## 2017-03-21 NOTE — Progress Notes (Signed)
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 This caused him significant discomfort, resulting in decreased food intake.      12/28/2016 Imaging    CT of the neck on 12/28/16 had demonstrate no frank adenopathy in the neck and the tongue was not well visualized die to dental work. On the same day a CT of his chest showed emphysema of the lungs and a few nonspecific pulmonary nodules which were unchanged compared to 06/2015.      01/15/2017 PET scan    This revealed hypermetabolic activity in the left anterior tongue as well as within a small left level 2 node and small nodules in the left parotid gland. No evidence of metastatic disease distantly      01/28/2017 Pathology Results    A.TONGUE, LEFT PARTIAL GLOSSECTOMY: Invasive moderate to poorly-differentiated squamous cell carcinoma, conventional type, 2.5 cm in greatest dimension on gross examination. Invasive squamous cell carcinoma shows greatest depth of invasion of 1.5 cm. Margins uninvolved by invasive tumor (invasive squamous cell carcinoma comes to within 0.6 cm of the inked posterior margin). Negative for lymphovascular invasion Negative for perineural invasion. AJCC Pathologic Stage: pT3 pN3 pM- not applicable. See Cancer Case Summary.  SURGICAL PATHOLOGY CANCER CASE SUMMARY- LIP AND ORAL CAVITY Protocol posting date: June 2017 Procedure: Partial glossectomy Tumor site: Oral, tongue Tumor laterality: left Tumor focality: unifocal Tumor size: Greatest dimension 2.5 cm on gross examination Histologic type: Squamous cell carcinoma, conventional Histologic grade: G2-G3, moderate to poorly differentiated Specimen margins:  Uninvolved by invasive tumor Distance from closest margin: 6 mm (0.6 cm) from posterior margin Lymphovascular invasion: Not identified Perineural invasion: Not identified Regional lymph nodes: Number of lymph nodes involved: 3 Number of lymph nodes examined: 25 Laterality of lymph nodes involved: Bilateral Size of largest metastatic deposit: 9 mm (0.9 cm) Extranodal extension: very focally present in 1 lymph node (part J) Pathologic Stage Classification (pTNM, AJCC 8th Edition) Primary tumor (pT): pT3 Regional lymph nodes (pN): pN3 Distant metastasis (pM): not applicable   B.ANTERIOR DORSAL TONGUE, BIOPSY: Negative for malignancy.  C.POSTERIOR DORSAL TONGUE, BIOPSY: Negative for malignancy.  D.MID DORSAL TONGUE, BIOPSY: Negative for malignancy.  E.DEEP TONGUE, BIOPSY: Negative for malignancy.  F.ANTERIOR FLOOR OF MOUTH, BIOPSY: Negative for malignancy.  G.POSTERIOR FLOOR OF MOUTH, BIOPSY: Negative for malignancy.  H.LEVEL IA, RESECTION: Six benign lymph nodes negative for metastatic carcinoma on routine H&E staining (0/6).  I.LEVEL IB, RESECTION: Two benign lymph nodes negative for metastatic carcinoma on routine H&E staining (0/2). Benign salivary gland tissue.  J.LEFT LEVEL IIA , 3 AND 4, RESECTION: One of twenty-one lymph nodes positive for metastatic carcinoma (0.9 cm focus) with very focal extranodal extension on routine H&E staining (1/21).  K.LEVEL IIB, RESECTION: Four benign lymph nodes negative for metastatic carcinoma on routine H&E staining (0/4). One small focus of benign salivary gland tissue.  L.RIGHT LEVEL IB, RESECTION: Three benign lymph nodes negative for  metastatic carcinoma on routine H&E staining (0/3). Benign salivary gland tissue.  M.RIGHT LEVEL 2A, 3, 4, RESECTION: Two of fifteen lymph nodes positive for metastatic carcinoma (2/15). See comment.  Comment: A small focus suspicious for metastatic carcinoma is seen on the initial H&E stained slide. Therefore, a pankeratin immunohistochemical stain  is performed. The positive control stain functioned appropriately. The pankeratin immunostain highlights a few minute foci of metastatic carcinoma in 2 of the 3 lymph nodes (largest focus 0.2 mm [0.02 cm]).      01/28/2017 Surgery    He had surgery at [facility] <100 SQCM [15100] (SPLIT THICKNESS SKIN GRAFT LEG)           03/01/2017 Procedure    Successful fluoroscopic guided percutaneous gastrostomy tube placement.      03/01/2017 Procedure    Placement of a subcutaneous port device. Catheter tip at the superior cavoatrial junction and ready to be used.      03/12/2017 -  Radiation Therapy    He received radiation treatment       03/13/2017 Procedure    Baseline hearing is near normal      03/15/2017 -  Chemotherapy    He received cisplatin       INTERVAL HISTORY: Please see below for problem oriented charting. He is seen prior to next dose of chemo So far, he tolerated treatment well He complained of persistent pain near the surgical sites It is causing some problem with sleeping and eating He has lost some weight He is able to only tolerate 4 cans of nutritional supplement but is willing to try No mucositis, nausea or vomiting  REVIEW OF SYSTEMS:   Constitutional: Denies fevers, chills  Eyes: Denies blurriness of vision Ears, nose, mouth, throat, and face: Denies mucositis or sore throat Respiratory: Denies cough, dyspnea or wheezes Cardiovascular: Denies palpitation, chest discomfort or lower  extremity swelling Gastrointestinal:  Denies nausea, heartburn or change in bowel habits Skin: Denies abnormal skin rashes Lymphatics: Denies new lymphadenopathy or easy bruising Neurological:Denies numbness, tingling or new weaknesses Behavioral/Psych: Mood is stable, no new changes  All other systems were reviewed with the patient and are negative.  I have reviewed the past medical history, past surgical history, social history and family history with the patient and they are unchanged from previous note.  ALLERGIES:  is allergic to latex; adhesive [tape]; and codeine.  MEDICATIONS:  Current Outpatient Prescriptions  Medication Sig Dispense Refill  . aspirin 81 MG chewable tablet Chew 81 mg by mouth every morning.     Marland Kitchen dexamethasone (DECADRON) 4 MG tablet Taper as directed, take with food. 60 tablet 0  . magic mouthwash w/lidocaine SOLN Take 5 mLs by mouth 4 (four) times daily as needed for mouth pain. 240 mL 0  . fentaNYL (DURAGESIC - DOSED MCG/HR) 25 MCG/HR patch Place 1 patch (25 mcg total) onto the skin every 3 (three) days. 5 patch 0  . lidocaine-prilocaine (EMLA) cream Apply to affected area once (Patient not taking: Reported on 03/21/2017) 30 g 3  . morphine (ROXANOL) 20 MG/ML concentrated solution Take 0.5 mLs (10 mg total) by mouth every 2 (two) hours as needed for severe pain. 120 mL 0  . ondansetron (ZOFRAN) 8 MG tablet Take 1 tablet (8 mg total) by mouth every 8 (eight) hours as needed. Start on the third day after chemotherapy. (Patient not taking: Reported on 03/21/2017) 30 tablet 1  . prochlorperazine (COMPAZINE) 10 MG tablet Take 1 tablet (10 mg total) by mouth every 6 (six) hours as needed (Nausea or vomiting). (Patient not taking: Reported on 03/21/2017) 30 tablet 1   No current facility-administered medications for this visit.     PHYSICAL EXAMINATION: ECOG PERFORMANCE STATUS: 1 - Symptomatic but  completely ambulatory  Vitals:   03/21/17 1049  BP: 135/81  Pulse: 84   Resp: 18   Filed Weights   03/21/17 1049  Weight: 160 lb (72.6 kg)    GENERAL:alert, no distress and comfortable SKIN: skin color, texture, turgor are normal, no rashes or significant lesions EYES: normal, Conjunctiva are pink and non-injected, sclera clear OROPHARYNX:no exudate, no erythema and lips, buccal mucosa, and tongue normal  NECK: supple, thyroid normal size, non-tender, without nodularity LYMPH:  no palpable lymphadenopathy in the cervical, axillary or inguinal LUNGS: clear to auscultation and percussion with normal breathing effort HEART: regular rate & rhythm and no murmurs and no lower extremity edema ABDOMEN:abdomen soft, non-tender and normal bowel sounds Musculoskeletal:no cyanosis of digits and no clubbing  NEURO: alert & oriented x 3 with fluent speech, no focal motor/sensory deficits  LABORATORY DATA:  I have reviewed the data as listed    Component Value Date/Time   NA 134 (L) 03/20/2017 0905   K 4.6 03/20/2017 0905   CL 99 02/04/2015 1419   CO2 25 03/20/2017 0905   GLUCOSE 102 03/20/2017 0905   BUN 22.0 03/20/2017 0905   CREATININE 0.7 03/20/2017 0905   CALCIUM 9.7 03/20/2017 0905   PROT 7.6 03/20/2017 0905   ALBUMIN 3.4 (L) 03/20/2017 0905   AST 31 03/20/2017 0905   ALT 34 03/20/2017 0905   ALKPHOS 81 03/20/2017 0905   BILITOT 0.76 03/20/2017 0905   GFRNONAA >90 07/24/2014 1220   GFRAA >90 07/24/2014 1220    No results found for: SPEP, UPEP  Lab Results  Component Value Date   WBC 12.4 (H) 03/20/2017   NEUTROABS 11.0 (H) 03/20/2017   HGB 12.8 (L) 03/20/2017   HCT 38.5 03/20/2017   MCV 94.8 03/20/2017   PLT 329 03/20/2017      Chemistry      Component Value Date/Time   NA 134 (L) 03/20/2017 0905   K 4.6 03/20/2017 0905   CL 99 02/04/2015 1419   CO2 25 03/20/2017 0905   BUN 22.0 03/20/2017 0905   CREATININE 0.7 03/20/2017 0905      Component Value Date/Time   CALCIUM 9.7 03/20/2017 0905   ALKPHOS 81 03/20/2017 0905   AST 31  03/20/2017 0905   ALT 34 03/20/2017 0905   BILITOT 0.76 03/20/2017 0905       RADIOGRAPHIC STUDIES: I have personally reviewed the radiological images as listed and agreed with the findings in the report. Ir Gastrostomy Tube Mod Sed  Result Date: 03/01/2017 INDICATION: 66 year old with tongue cancer. Scheduled for Port-A-Cath placement and gastrostomy tube placement. Port-A-Cath placement was performed immediately prior to gastrostomy tube placement. EXAM: PERCUTANEOUS GASTROSTOMY TUBE WITH FLUOROSCOPIC GUIDANCE Physician: Stephan Minister. Anselm Pancoast, MD MEDICATIONS: Ancef 2 g; Antibiotics were given prior to Port-A-Cath placement. Glucagon 1 mg IV ANESTHESIA/SEDATION: Versed 1.0 mg IV; Fentanyl 50 mcg IV Moderate Sedation Time:  37 minutes The patient was continuously monitored during the procedure by the interventional radiology nurse under my direct supervision. FLUOROSCOPY TIME:  Fluoroscopy Time: 6 minutes 30 seconds (186 mGy). COMPLICATIONS: None immediate. PROCEDURE: The procedure was explained to the patient. The risks and benefits of the procedure were discussed and the patient's questions were addressed. Informed consent was obtained from the patient. Plan for a direct puncture gastrostomy tube placement rather than a pull-through due to recent tongue surgery. The patient was placed on the interventional table. Fluoroscopy demonstrated oral contrast in the transverse colon. Patient already had a nasogastric tube in place. The anterior  abdomen was prepped and draped in sterile fashion. Maximal barrier sterile technique was utilized including caps, mask, sterile gowns, sterile gloves, sterile drape, hand hygiene and skin antiseptic. Stomach was inflated with air through the nasogastric tube. Saf-T-Pexy T fastener needle was advanced to the stomach under fluoroscopy. T fastener was deployed. A second T fastener was deployed with fluoroscopy. An incision was made between the T-fasteners. Needle was directed  between the T-fasteners with fluoroscopy into the distended stomach. Contrast injection confirmed placement in the stomach. A stiff Amplatz wire was placed. The tract was dilated to accommodate a peel-away sheath. A 14 French balloon retention catheter was advanced through the peel-away sheath over the wire. The wire and peel-away sheath were removed. The balloon was inflated with 7 mL of saline. Contrast injection confirmed placement in the stomach. Fluoroscopic images were obtained for documentation. The gastrostomy tube was flushed with normal saline. IMPRESSION: Successful fluoroscopic guided percutaneous gastrostomy tube placement. These Saf-T-Pexy T-fasteners sutures are designed to absorb. Instructed the family to inform Interventional Radiology if these T-fasteners are still present in 2-3 weeks. Electronically Signed   By: Markus Daft M.D.   On: 03/01/2017 17:18   Ir US Guide Vasc Access Right  Result Date: 03/01/2017 INDICATION: 66 year old with tongue cancer. Scheduled for Port-A-Cath and gastrostomy tube placement. EXAM: FLUOROSCOPIC AND ULTRASOUND GUIDED PLACEMENT OF A SUBCUTANEOUS PORT COMPARISON:  None. MEDICATIONS: Ancef 2 g; The antibiotic was administered within an appropriate time interval prior to skin puncture. ANESTHESIA/SEDATION: Versed 3.0 mg IV; Fentanyl 50 mcg IV; Moderate Sedation Time:  22 minutes The patient was continuously monitored during the procedure by the interventional radiology nurse under my direct supervision. FLUOROSCOPY TIME:  18 seconds, 5 mGy COMPLICATIONS: None immediate. PROCEDURE: The procedure, risks, benefits, and alternatives were explained to the patient. Questions regarding the procedure were encouraged and answered. The patient understands and consents to the procedure. Patient was placed supine on the interventional table. Ultrasound confirmed a patent right internal jugular vein. The right chest and neck were cleaned with a skin antiseptic and a sterile drape  was placed. Maximal barrier sterile technique was utilized including caps, mask, sterile gowns, sterile gloves, sterile drape, hand hygiene and skin antiseptic. The right neck was anesthetized with 1% lidocaine. Small incision was made in the right neck with a blade. Micropuncture set was placed in the right internal jugular vein with ultrasound guidance. The micropuncture wire was used for measurement purposes. The right chest was anesthetized with 1% lidocaine with epinephrine. #15 blade was used to make an incision and a subcutaneous port pocket was formed. Camano was assembled. Subcutaneous tunnel was formed with a stiff tunneling device. The port catheter was brought through the subcutaneous tunnel. The port was placed in the subcutaneous pocket. The micropuncture set was exchanged for a peel-away sheath. The catheter was placed through the peel-away sheath and the tip was positioned at the superior cavoatrial junction. Catheter placement was confirmed with fluoroscopy. The port was accessed and flushed with heparinized saline. The port pocket was closed using two layers of absorbable sutures and Dermabond. The vein skin site was closed using a single layer of absorbable suture and skin glue. Sterile dressings were applied. Patient tolerated the procedure well without an immediate complication. Ultrasound and fluoroscopic images were taken and saved for this procedure. IMPRESSION: Placement of a subcutaneous port device. Catheter tip at the superior cavoatrial junction and ready to be used. Electronically Signed   By: Scherrie Gerlach.D.  On: 03/01/2017 17:05   Ir Fluoro Guide Port Insertion Right  Result Date: 03/01/2017 INDICATION: 66 year old with tongue cancer. Scheduled for Port-A-Cath and gastrostomy tube placement. EXAM: FLUOROSCOPIC AND ULTRASOUND GUIDED PLACEMENT OF A SUBCUTANEOUS PORT COMPARISON:  None. MEDICATIONS: Ancef 2 g; The antibiotic was administered within an appropriate time  interval prior to skin puncture. ANESTHESIA/SEDATION: Versed 3.0 mg IV; Fentanyl 50 mcg IV; Moderate Sedation Time:  22 minutes The patient was continuously monitored during the procedure by the interventional radiology nurse under my direct supervision. FLUOROSCOPY TIME:  18 seconds, 5 mGy COMPLICATIONS: None immediate. PROCEDURE: The procedure, risks, benefits, and alternatives were explained to the patient. Questions regarding the procedure were encouraged and answered. The patient understands and consents to the procedure. Patient was placed supine on the interventional table. Ultrasound confirmed a patent right internal jugular vein. The right chest and neck were cleaned with a skin antiseptic and a sterile drape was placed. Maximal barrier sterile technique was utilized including caps, mask, sterile gowns, sterile gloves, sterile drape, hand hygiene and skin antiseptic. The right neck was anesthetized with 1% lidocaine. Small incision was made in the right neck with a blade. Micropuncture set was placed in the right internal jugular vein with ultrasound guidance. The micropuncture wire was used for measurement purposes. The right chest was anesthetized with 1% lidocaine with epinephrine. #15 blade was used to make an incision and a subcutaneous port pocket was formed. Joes was assembled. Subcutaneous tunnel was formed with a stiff tunneling device. The port catheter was brought through the subcutaneous tunnel. The port was placed in the subcutaneous pocket. The micropuncture set was exchanged for a peel-away sheath. The catheter was placed through the peel-away sheath and the tip was positioned at the superior cavoatrial junction. Catheter placement was confirmed with fluoroscopy. The port was accessed and flushed with heparinized saline. The port pocket was closed using two layers of absorbable sutures and Dermabond. The vein skin site was closed using a single layer of absorbable suture and  skin glue. Sterile dressings were applied. Patient tolerated the procedure well without an immediate complication. Ultrasound and fluoroscopic images were taken and saved for this procedure. IMPRESSION: Placement of a subcutaneous port device. Catheter tip at the superior cavoatrial junction and ready to be used. Electronically Signed   By: Markus Daft M.D.   On: 03/01/2017 17:05    ASSESSMENT & PLAN:  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[facility]) He tolerated chemotherapy side effects well We will continue weekly treatment and supportive care visits as scheduled  Incisional pain He has mild pain from recent surgery We discussed narcotic prescription refill policy I will start him on some liquid morphine sulfate to take as needed  Weight loss He has progressive weight loss since surgery We discussed the importance of increasing nutritional supplement as tolerated  Anemia due to antineoplastic chemotherapy This is likely due to recent treatment. The patient denies recent history of bleeding such as epistaxis, hematuria or hematochezia. He is asymptomatic from the anemia. I will observe for now.  He does not require transfusion now. I will continue the chemotherapy at current dose without dosage adjustment.  If the anemia gets progressive worse in the future, I might have to delay his treatment or adjust the chemotherapy dose.    No orders of the defined types were placed in this encounter.  All questions were answered. The patient knows to call the clinic with any problems, questions or concerns. No barriers to learning  was detected. I spent 15 minutes counseling the patient face to face. The total time spent in the appointment was 20 minutes and more than 50% was on counseling and review of test results     Heath Lark, MD 03/21/2017 4:37 PM

## 2017-03-22 ENCOUNTER — Ambulatory Visit
Admission: RE | Admit: 2017-03-22 | Discharge: 2017-03-22 | Disposition: A | Discharge status: Home / Self Care | Payer: Medicare PPO | Source: Ambulatory Visit | Attending: Radiation Oncology | Admitting: Radiation Oncology

## 2017-03-22 ENCOUNTER — Ambulatory Visit: Payer: Medicare PPO | Admitting: Nutrition

## 2017-03-22 ENCOUNTER — Ambulatory Visit (HOSPITAL_BASED_OUTPATIENT_CLINIC_OR_DEPARTMENT_OTHER): Payer: No Typology Code available for payment source

## 2017-03-22 VITALS — BP 130/80 | HR 84 | Temp 97.9°F

## 2017-03-22 DIAGNOSIS — Z510 Encounter for antineoplastic radiation therapy: Secondary | ICD-10-CM | POA: Diagnosis not present

## 2017-03-22 DIAGNOSIS — C021 Malignant neoplasm of border of tongue: Secondary | ICD-10-CM | POA: Diagnosis not present

## 2017-03-22 DIAGNOSIS — Z5111 Encounter for antineoplastic chemotherapy: Secondary | ICD-10-CM | POA: Diagnosis not present

## 2017-03-22 DIAGNOSIS — Z95828 Presence of other vascular implants and grafts: Secondary | ICD-10-CM

## 2017-03-22 MED ORDER — SODIUM CHLORIDE 0.9 % IV SOLN
Freq: Once | INTRAVENOUS | Status: AC
  Administered 2017-03-22: 14:00:00 via INTRAVENOUS
  Filled 2017-03-22: qty 5

## 2017-03-22 MED ORDER — HYDROMORPHONE HCL 4 MG/ML IJ SOLN
INTRAMUSCULAR | Status: AC
  Filled 2017-03-22: qty 1

## 2017-03-22 MED ORDER — PALONOSETRON HCL INJECTION 0.25 MG/5ML
INTRAVENOUS | Status: AC
  Filled 2017-03-22: qty 5

## 2017-03-22 MED ORDER — HEPARIN SOD (PORK) LOCK FLUSH 100 UNIT/ML IV SOLN
500.0000 [IU] | Freq: Once | INTRAVENOUS | Status: AC | PRN
  Administered 2017-03-22: 500 [IU]
  Filled 2017-03-22: qty 5

## 2017-03-22 MED ORDER — SODIUM CHLORIDE 0.9 % IV SOLN
40.0000 mg/m2 | Freq: Once | INTRAVENOUS | Status: AC
  Administered 2017-03-22: 76 mg via INTRAVENOUS
  Filled 2017-03-22: qty 76

## 2017-03-22 MED ORDER — PALONOSETRON HCL INJECTION 0.25 MG/5ML
0.2500 mg | Freq: Once | INTRAVENOUS | Status: AC
  Administered 2017-03-22: 0.25 mg via INTRAVENOUS

## 2017-03-22 MED ORDER — SODIUM CHLORIDE 0.9% FLUSH
10.0000 mL | INTRAVENOUS | Status: DC | PRN
  Administered 2017-03-22: 10 mL
  Filled 2017-03-22: qty 10

## 2017-03-22 MED ORDER — POTASSIUM CHLORIDE 2 MEQ/ML IV SOLN
Freq: Once | INTRAVENOUS | Status: AC
  Administered 2017-03-22: 11:00:00 via INTRAVENOUS
  Filled 2017-03-22: qty 10

## 2017-03-22 MED ORDER — HYDROMORPHONE HCL 4 MG/ML IJ SOLN
1.0000 mg | INTRAMUSCULAR | Status: DC | PRN
  Administered 2017-03-22: 1 mg via INTRAVENOUS

## 2017-03-22 MED ORDER — SODIUM CHLORIDE 0.9 % IV SOLN
Freq: Once | INTRAVENOUS | Status: AC
  Administered 2017-03-22: 10:00:00 via INTRAVENOUS

## 2017-03-22 NOTE — Progress Notes (Signed)
OK to treat with urine output of 125 per Dr. Alvy Bimler.

## 2017-03-22 NOTE — Progress Notes (Signed)
Nutrition follow-up completed with patient during chemotherapy for tongue cancer. Weight decreased and documented as 160 pounds April 19, down from 166.3 pounds April 4. Patient reports he knows he has lost too much weight and has agreed to increase his tube feeding. Reports he currently is using one bottle Nutren 1.5-4 times a day with 60 mL of free water before and after bolus feeding. Patient agrees to increase tube feeding to 6 bottles daily.  Nutrition diagnosis: Unintended weight loss continues.  Estimated nutrition needs: 2130-2330 calories, 95-1 05 g protein, 2.3 L fluid.  Intervention: Educated patient to increase Nutren 1.5-1-1/2 bottles 4 times a day with 60 mL free water before and after bolus feedings. This will provide 2250 cal, 102 g protein, and 1626 mL free water.  Patient will require an additional 675 mL free water daily to meet minimum free water requirements. Patient was educated and teach back method used. Patient was given written tube feeding guidelines.  Monitoring, evaluation, goals: Patient will increase tube feeding to meet minimum estimated needs for weight maintenance/weight gain.  Next visit: Friday, April 27, during infusion.  **Disclaimer: This note was dictated with voice recognition software. Similar sounding words can inadvertently be transcribed and this note may contain transcription errors which may not have been corrected upon publication of note.**

## 2017-03-24 ENCOUNTER — Encounter: Payer: Self-pay | Admitting: *Deleted

## 2017-03-24 NOTE — Progress Notes (Signed)
Oncology Nurse Navigator Documentation  Navigation flowsheet update.  Gayleen Orem, RN, BSN, [place] [facility] Oncology Nurse [facility] [place] [phone]

## 2017-03-25 ENCOUNTER — Ambulatory Visit
Admission: RE | Admit: 2017-03-25 | Discharge: 2017-03-25 | Disposition: A | Discharge status: Home / Self Care | Payer: Medicare PPO | Source: Ambulatory Visit | Attending: Radiation Oncology | Admitting: Radiation Oncology

## 2017-03-25 ENCOUNTER — Ambulatory Visit: Payer: No Typology Code available for payment source | Admitting: Physical Therapy

## 2017-03-25 DIAGNOSIS — R1310 Dysphagia, unspecified: Secondary | ICD-10-CM | POA: Diagnosis not present

## 2017-03-25 DIAGNOSIS — I890 Lymphedema, not elsewhere classified: Secondary | ICD-10-CM

## 2017-03-25 DIAGNOSIS — Z483 Aftercare following surgery for neoplasm: Secondary | ICD-10-CM

## 2017-03-25 DIAGNOSIS — Z510 Encounter for antineoplastic radiation therapy: Secondary | ICD-10-CM | POA: Diagnosis not present
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
## 2017-03-26 ENCOUNTER — Ambulatory Visit
Admission: RE | Admit: 2017-03-26 | Discharge: 2017-03-26 | Disposition: A | Discharge status: Home / Self Care | Payer: Medicare PPO | Source: Ambulatory Visit | Attending: Radiation Oncology | Admitting: Radiation Oncology

## 2017-03-26 DIAGNOSIS — Z510 Encounter for antineoplastic radiation therapy: Secondary | ICD-10-CM | POA: Diagnosis not present

## 2017-03-27 ENCOUNTER — Ambulatory Visit
Admission: RE | Admit: 2017-03-27 | Discharge: 2017-03-27 | Disposition: A | Discharge status: Home / Self Care | Payer: Medicare PPO | Source: Ambulatory Visit | Attending: Radiation Oncology | Admitting: Radiation Oncology

## 2017-03-27 ENCOUNTER — Other Ambulatory Visit (HOSPITAL_BASED_OUTPATIENT_CLINIC_OR_DEPARTMENT_OTHER): Payer: No Typology Code available for payment source

## 2017-03-27 ENCOUNTER — Ambulatory Visit: Payer: No Typology Code available for payment source | Admitting: Physical Therapy

## 2017-03-27 DIAGNOSIS — C021 Malignant neoplasm of border of tongue: Secondary | ICD-10-CM | POA: Diagnosis not present

## 2017-03-27 DIAGNOSIS — R1310 Dysphagia, unspecified: Secondary | ICD-10-CM | POA: Diagnosis not present

## 2017-03-27 DIAGNOSIS — I890 Lymphedema, not elsewhere classified: Secondary | ICD-10-CM

## 2017-03-27 DIAGNOSIS — Z483 Aftercare following surgery for neoplasm: Secondary | ICD-10-CM

## 2017-03-27 DIAGNOSIS — Z510 Encounter for antineoplastic radiation therapy: Secondary | ICD-10-CM | POA: Diagnosis not present

## 2017-03-27 LAB — CBC WITH DIFFERENTIAL/PLATELET
BASO%: 0.6 % (ref 0.0–2.0)
Basophils Absolute: 0.1 10*3/uL (ref 0.0–0.1)
EOS%: 2.1 % (ref 0.0–7.0)
Eosinophils Absolute: 0.3 10*3/uL (ref 0.0–0.5)
HCT: 39.6 % (ref 38.4–49.9)
HGB: 13 g/dL (ref 13.0–17.1)
LYMPH%: 6.2 % — AB (ref 14.0–49.0)
MCH: 31.6 pg (ref 27.2–33.4)
MCHC: 32.8 g/dL (ref 32.0–36.0)
MCV: 96.2 fL (ref 79.3–98.0)
MONO#: 1.1 10*3/uL — AB (ref 0.1–0.9)
MONO%: 9.5 % (ref 0.0–14.0)
NEUT#: 9.9 10*3/uL — ABNORMAL HIGH (ref 1.5–6.5)
NEUT%: 81.6 % — ABNORMAL HIGH (ref 39.0–75.0)
PLATELETS: 291 10*3/uL (ref 140–400)
RBC: 4.11 10*6/uL — AB (ref 4.20–5.82)
RDW: 12.8 % (ref 11.0–14.6)
WBC: 12.1 10*3/uL — ABNORMAL HIGH (ref 4.0–10.3)
lymph#: 0.8 10*3/uL — ABNORMAL LOW (ref 0.9–3.3)

## 2017-03-27 LAB — COMPREHENSIVE METABOLIC PANEL
ALBUMIN: 3.4 g/dL — AB (ref 3.5–5.0)
ALK PHOS: 82 U/L (ref 40–150)
ALT: 31 U/L (ref 0–55)
AST: 20 U/L (ref 5–34)
Anion Gap: 10 mEq/L (ref 3–11)
BUN: 22.7 mg/dL (ref 7.0–26.0)
CO2: 28 mEq/L (ref 22–29)
Calcium: 9.9 mg/dL (ref 8.4–10.4)
Chloride: 96 mEq/L — ABNORMAL LOW (ref 98–109)
Creatinine: 0.8 mg/dL (ref 0.7–1.3)
Glucose: 100 mg/dl (ref 70–140)
POTASSIUM: 4.8 meq/L (ref 3.5–5.1)
SODIUM: 135 meq/L — AB (ref 136–145)
Total Bilirubin: 0.79 mg/dL (ref 0.20–1.20)
Total Protein: 7.6 g/dL (ref 6.4–8.3)

## 2017-03-27 LAB — MAGNESIUM: MAGNESIUM: 1.6 mg/dL (ref 1.5–2.5)
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
## 2017-03-28 ENCOUNTER — Encounter: Payer: Self-pay | Admitting: Hematology and Oncology

## 2017-03-28 ENCOUNTER — Ambulatory Visit
Admission: RE | Admit: 2017-03-28 | Discharge: 2017-03-28 | Disposition: A | Discharge status: Home / Self Care | Payer: Medicare PPO | Source: Ambulatory Visit | Attending: Radiation Oncology | Admitting: Radiation Oncology

## 2017-03-28 ENCOUNTER — Ambulatory Visit (HOSPITAL_BASED_OUTPATIENT_CLINIC_OR_DEPARTMENT_OTHER): Payer: No Typology Code available for payment source | Admitting: Hematology and Oncology

## 2017-03-28 DIAGNOSIS — G620 Drug-induced polyneuropathy: Secondary | ICD-10-CM

## 2017-03-28 DIAGNOSIS — K1231 Oral mucositis (ulcerative) due to antineoplastic therapy: Secondary | ICD-10-CM | POA: Diagnosis not present

## 2017-03-28 DIAGNOSIS — C021 Malignant neoplasm of border of tongue: Secondary | ICD-10-CM

## 2017-03-28 DIAGNOSIS — T451X5A Adverse effect of antineoplastic and immunosuppressive drugs, initial encounter: Secondary | ICD-10-CM

## 2017-03-28 DIAGNOSIS — R1310 Dysphagia, unspecified: Secondary | ICD-10-CM | POA: Diagnosis not present

## 2017-03-28 DIAGNOSIS — B370 Candidal stomatitis: Secondary | ICD-10-CM | POA: Diagnosis not present

## 2017-03-28 DIAGNOSIS — R221 Localized swelling, mass and lump, neck: Secondary | ICD-10-CM | POA: Diagnosis not present

## 2017-03-28 DIAGNOSIS — Z510 Encounter for antineoplastic radiation therapy: Secondary | ICD-10-CM | POA: Diagnosis not present

## 2017-03-28 MED ORDER — FLUCONAZOLE 100 MG PO TABS
100.0000 mg | ORAL_TABLET | Freq: Every day | ORAL | 0 refills | Status: DC
Start: 2017-03-28 — End: 2017-04-25

## 2017-03-28 MED FILL — FLUCONAZOLE 100 MG TABLET: 100 | 7 days supply | Qty: 7 | Fill #0

## 2017-03-28 NOTE — Assessment & Plan Note (Addendum)
He tolerated chemotherapy well but has started to experience side-effects We will continue weekly treatment and supportive care visits as scheduled I would proceed with same dose chemotherapy without dose adjustment

## 2017-03-28 NOTE — Assessment & Plan Note (Signed)
He has started to experience mucositis related to side effects of treatment. He has tried Roxanol without much success I recommend increasing the dose of Roxanol for the next 2 days and if not improved, to stop fentanyl I will reassess pain control next week.

## 2017-03-28 NOTE — Assessment & Plan Note (Signed)
He has mild neck swelling and was started on some steroid treatment. Steroid likely exacerbated thrush We will continue treatment as above

## 2017-03-28 NOTE — Assessment & Plan Note (Signed)
He has not eaten much by mouth. I encouraged him to continue to eat and swallow if possible He will continue to use feeding tube as tolerated

## 2017-03-28 NOTE — Progress Notes (Signed)
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 This caused him significant discomfort, resulting in decreased food intake.      12/28/2016 Imaging    CT of the neck on 12/28/16 had demonstrate no frank adenopathy in the neck and the tongue was not well visualized die to dental work. On the same day a CT of his chest showed emphysema of the lungs and a few nonspecific pulmonary nodules which were unchanged compared to 06/2015.      01/15/2017 PET scan    This revealed hypermetabolic activity in the left anterior tongue as well as within a small left level 2 node and small nodules in the left parotid gland. No evidence of metastatic disease distantly      01/28/2017 Pathology Results    A.TONGUE, LEFT PARTIAL GLOSSECTOMY: Invasive moderate to poorly-differentiated squamous cell carcinoma, conventional type, 2.5 cm in greatest dimension on gross examination. Invasive squamous cell carcinoma shows greatest depth of invasion of 1.5 cm. Margins uninvolved by invasive tumor (invasive squamous cell carcinoma comes to within 0.6 cm of the inked posterior margin). Negative for lymphovascular invasion Negative for perineural invasion. AJCC Pathologic Stage: pT3 pN3 pM- not applicable. See Cancer Case Summary.  SURGICAL PATHOLOGY CANCER CASE SUMMARY- LIP AND ORAL CAVITY Protocol posting date: June 2017 Procedure: Partial glossectomy Tumor site: Oral, tongue Tumor laterality: left Tumor focality: unifocal Tumor size: Greatest dimension 2.5 cm on gross examination Histologic type: Squamous cell carcinoma, conventional Histologic grade: G2-G3, moderate to poorly differentiated Specimen margins:  Uninvolved by invasive tumor Distance from closest margin: 6 mm (0.6 cm) from posterior margin Lymphovascular invasion: Not identified Perineural invasion: Not identified Regional lymph nodes: Number of lymph nodes involved: 3 Number of lymph nodes examined: 35 Laterality of lymph nodes involved: Bilateral Size of largest metastatic deposit: 9 mm (0.9 cm) Extranodal extension: very focally present in 1 lymph node (part J) Pathologic Stage Classification (pTNM, AJCC 8th Edition) Primary tumor (pT): pT3 Regional lymph nodes (pN): pN3 Distant metastasis (pM): not applicable   B.ANTERIOR DORSAL TONGUE, BIOPSY: Negative for malignancy.  C.POSTERIOR DORSAL TONGUE, BIOPSY: Negative for malignancy.  D.MID DORSAL TONGUE, BIOPSY: Negative for malignancy.  E.DEEP TONGUE, BIOPSY: Negative for malignancy.  F.ANTERIOR FLOOR OF MOUTH, BIOPSY: Negative for malignancy.  G.POSTERIOR FLOOR OF MOUTH, BIOPSY: Negative for malignancy.  H.LEVEL IA, RESECTION: Six benign lymph nodes negative for metastatic carcinoma on routine H&E staining (0/6).  I.LEVEL IB, RESECTION: Two benign lymph nodes negative for metastatic carcinoma on routine H&E staining (0/2). Benign salivary gland tissue.  J.LEFT LEVEL IIA , 3 AND 4, RESECTION: One of twenty-one lymph nodes positive for metastatic carcinoma (0.9 cm focus) with very focal extranodal extension on routine H&E staining (1/21).  K.LEVEL IIB, RESECTION: Four benign lymph nodes negative for metastatic carcinoma on routine H&E staining (0/4). One small focus of benign salivary gland tissue.  L.RIGHT LEVEL IB, RESECTION: Three benign lymph nodes negative for  metastatic carcinoma on routine H&E staining (0/3). Benign salivary gland tissue.  M.RIGHT LEVEL 2A, 3, 4, RESECTION: Two of fifteen lymph nodes positive for metastatic carcinoma (2/15). See comment.  Comment: A small focus suspicious for metastatic carcinoma is seen on the initial H&E stained slide. Therefore, a pankeratin immunohistochemical stain  is performed. The positive control stain functioned appropriately. The pankeratin immunostain highlights a few minute foci of metastatic carcinoma in 2 of the 3 lymph nodes (largest focus 0.2 mm [0.02 cm]).      01/28/2017 Surgery    He had surgery at [facility] <100 SQCM [15100] (SPLIT THICKNESS SKIN GRAFT LEG)           03/01/2017 Procedure    Successful fluoroscopic guided percutaneous gastrostomy tube placement.      03/01/2017 Procedure    Placement of a subcutaneous port device. Catheter tip at the superior cavoatrial junction and ready to be used.      03/12/2017 -  Radiation Therapy    He received radiation treatment       03/13/2017 Procedure    Baseline hearing is near normal      03/15/2017 -  Chemotherapy    He received cisplatin       INTERVAL HISTORY: Please see below for problem oriented charting. He is seen prior to his treatment. He has not lost weight since I saw him. He is able to tolerate 6 cans of nutritional supplement per day. He has mild dysphagia but no choking. He was started on some steroids due to neck swelling. He complained of blistering pain/mucositis, not improved much by liquid morphine sulfate Denies recent constipation Denies significant peripheral neuropathy from treatment  REVIEW OF SYSTEMS:   Constitutional: Denies fevers, chills or abnormal weight loss Eyes: Denies blurriness of vision Respiratory: Denies cough, dyspnea or wheezes Cardiovascular: Denies  palpitation, chest discomfort or lower extremity swelling Gastrointestinal:  Denies nausea, heartburn or change in bowel habits Skin: Denies abnormal skin rashes Lymphatics: Denies new lymphadenopathy or easy bruising Neurological:Denies numbness, tingling or new weaknesses Behavioral/Psych: Mood is stable, no new changes  All other systems were reviewed with the patient and are negative.  I have reviewed the past medical history, past surgical history, social history and family history with the patient and they are unchanged from previous note.  ALLERGIES:  is allergic to latex; adhesive [tape]; and codeine.  MEDICATIONS:  Current Outpatient Prescriptions  Medication Sig Dispense Refill  . aspirin 81 MG chewable tablet Chew 81 mg by mouth every morning.     Marland Kitchen dexamethasone (DECADRON) 4 MG tablet Taper as directed, take with food. 60 tablet 0  . lidocaine-prilocaine (EMLA) cream Apply to affected area once 30 g 3  . morphine (ROXANOL) 20 MG/ML concentrated solution Take 0.5 mLs (10 mg total) by mouth every 2 (two) hours as needed for severe pain. 120 mL 0  . fentaNYL (DURAGESIC - DOSED MCG/HR) 25 MCG/HR patch Place 1 patch (25 mcg total) onto the skin every 3 (three) days. (Patient not taking: Reported on 03/28/2017) 5 patch 0  . fluconazole (DIFLUCAN) 100 MG tablet Take 1 tablet (100 mg total) by mouth daily. 7 tablet 0  . magic mouthwash w/lidocaine SOLN Take 5 mLs by mouth 4 (four) times daily as needed for mouth pain. (Patient not taking: Reported on 03/28/2017) 240 mL 0  . ondansetron (ZOFRAN) 8 MG tablet Take 1 tablet (8 mg total) by mouth every 8 (eight) hours as needed. Start on the third day after chemotherapy. (Patient not taking: Reported on 03/21/2017) 30 tablet 1  . prochlorperazine (COMPAZINE) 10 MG tablet Take 1 tablet (10 mg total) by mouth every 6 (six) hours as needed (Nausea or vomiting). (Patient not taking: Reported on 03/21/2017) 30  tablet 1   No current  facility-administered medications for this visit.     PHYSICAL EXAMINATION: ECOG PERFORMANCE STATUS: 1 - Symptomatic but completely ambulatory  Vitals:   03/28/17 1012 03/28/17 1018  BP: 137/75   Pulse: 87   Resp: 18   Temp:  (!) 96.9 F (36.1 C)   Filed Weights   03/28/17 1012  Weight: 160 lb 3.2 oz (72.7 kg)    GENERAL:alert, no distress and comfortable SKIN: Noted mild redness around his neck EYES: normal, Conjunctiva are pink and non-injected, sclera clear OROPHARYNX: Noted mucositis and oral thrush NECK: Noted mild swelling around his neck.  Well-healed surgical scar LYMPH:  no palpable lymphadenopathy in the cervical, axillary or inguinal LUNGS: clear to auscultation and percussion with normal breathing effort HEART: regular rate & rhythm and no murmurs and no lower extremity edema ABDOMEN:abdomen soft, non-tender and normal bowel sounds Musculoskeletal:no cyanosis of digits and no clubbing  NEURO: alert & oriented x 3 with fluent speech, no focal motor/sensory deficits  LABORATORY DATA:  I have reviewed the data as listed    Component Value Date/Time   NA 135 (L) 03/27/2017 0850   K 4.8 03/27/2017 0850   CL 99 02/04/2015 1419   CO2 28 03/27/2017 0850   GLUCOSE 100 03/27/2017 0850   BUN 22.7 03/27/2017 0850   CREATININE 0.8 03/27/2017 0850   CALCIUM 9.9 03/27/2017 0850   PROT 7.6 03/27/2017 0850   ALBUMIN 3.4 (L) 03/27/2017 0850   AST 20 03/27/2017 0850   ALT 31 03/27/2017 0850   ALKPHOS 82 03/27/2017 0850   BILITOT 0.79 03/27/2017 0850   GFRNONAA >90 07/24/2014 1220   GFRAA >90 07/24/2014 1220    No results found for: SPEP, UPEP  Lab Results  Component Value Date   WBC 12.1 (H) 03/27/2017   NEUTROABS 9.9 (H) 03/27/2017   HGB 13.0 03/27/2017   HCT 39.6 03/27/2017   MCV 96.2 03/27/2017   PLT 291 03/27/2017      Chemistry      Component Value Date/Time   NA 135 (L) 03/27/2017 0850   K 4.8 03/27/2017 0850   CL 99 02/04/2015 1419   CO2 28  03/27/2017 0850   BUN 22.7 03/27/2017 0850   CREATININE 0.8 03/27/2017 0850      Component Value Date/Time   CALCIUM 9.9 03/27/2017 0850   ALKPHOS 82 03/27/2017 0850   AST 20 03/27/2017 0850   ALT 31 03/27/2017 0850   BILITOT 0.79 03/27/2017 0850       RADIOGRAPHIC STUDIES: I have personally reviewed the radiological images as listed and agreed with the findings in the report. Ir Gastrostomy Tube Mod Sed  Result Date: 03/01/2017 INDICATION: 66 year old with tongue cancer. Scheduled for Port-A-Cath placement and gastrostomy tube placement. Port-A-Cath placement was performed immediately prior to gastrostomy tube placement. EXAM: PERCUTANEOUS GASTROSTOMY TUBE WITH FLUOROSCOPIC GUIDANCE Physician: Stephan Minister. Anselm Pancoast, MD MEDICATIONS: Ancef 2 g; Antibiotics were given prior to Port-A-Cath placement. Glucagon 1 mg IV ANESTHESIA/SEDATION: Versed 1.0 mg IV; Fentanyl 50 mcg IV Moderate Sedation Time:  37 minutes The patient was continuously monitored during the procedure by the interventional radiology nurse under my direct supervision. FLUOROSCOPY TIME:  Fluoroscopy Time: 6 minutes 30 seconds (186 mGy). COMPLICATIONS: None immediate. PROCEDURE: The procedure was explained to the patient. The risks and benefits of the procedure were discussed and the patient's questions were addressed. Informed consent was obtained from the patient. Plan for a direct puncture gastrostomy tube placement rather than a pull-through due  to recent tongue surgery. The patient was placed on the interventional table. Fluoroscopy demonstrated oral contrast in the transverse colon. Patient already had a nasogastric tube in place. The anterior abdomen was prepped and draped in sterile fashion. Maximal barrier sterile technique was utilized including caps, mask, sterile gowns, sterile gloves, sterile drape, hand hygiene and skin antiseptic. Stomach was inflated with air through the nasogastric tube. Saf-T-Pexy T fastener needle was advanced  to the stomach under fluoroscopy. T fastener was deployed. A second T fastener was deployed with fluoroscopy. An incision was made between the T-fasteners. Needle was directed between the T-fasteners with fluoroscopy into the distended stomach. Contrast injection confirmed placement in the stomach. A stiff Amplatz wire was placed. The tract was dilated to accommodate a peel-away sheath. A 14 French balloon retention catheter was advanced through the peel-away sheath over the wire. The wire and peel-away sheath were removed. The balloon was inflated with 7 mL of saline. Contrast injection confirmed placement in the stomach. Fluoroscopic images were obtained for documentation. The gastrostomy tube was flushed with normal saline. IMPRESSION: Successful fluoroscopic guided percutaneous gastrostomy tube placement. These Saf-T-Pexy T-fasteners sutures are designed to absorb. Instructed the family to inform Interventional Radiology if these T-fasteners are still present in 2-3 weeks. Electronically Signed   By: Markus Daft M.D.   On: 03/01/2017 17:18   Ir US Guide Vasc Access Right  Result Date: 03/01/2017 INDICATION: 66 year old with tongue cancer. Scheduled for Port-A-Cath and gastrostomy tube placement. EXAM: FLUOROSCOPIC AND ULTRASOUND GUIDED PLACEMENT OF A SUBCUTANEOUS PORT COMPARISON:  None. MEDICATIONS: Ancef 2 g; The antibiotic was administered within an appropriate time interval prior to skin puncture. ANESTHESIA/SEDATION: Versed 3.0 mg IV; Fentanyl 50 mcg IV; Moderate Sedation Time:  22 minutes The patient was continuously monitored during the procedure by the interventional radiology nurse under my direct supervision. FLUOROSCOPY TIME:  18 seconds, 5 mGy COMPLICATIONS: None immediate. PROCEDURE: The procedure, risks, benefits, and alternatives were explained to the patient. Questions regarding the procedure were encouraged and answered. The patient understands and consents to the procedure. Patient was placed  supine on the interventional table. Ultrasound confirmed a patent right internal jugular vein. The right chest and neck were cleaned with a skin antiseptic and a sterile drape was placed. Maximal barrier sterile technique was utilized including caps, mask, sterile gowns, sterile gloves, sterile drape, hand hygiene and skin antiseptic. The right neck was anesthetized with 1% lidocaine. Small incision was made in the right neck with a blade. Micropuncture set was placed in the right internal jugular vein with ultrasound guidance. The micropuncture wire was used for measurement purposes. The right chest was anesthetized with 1% lidocaine with epinephrine. #15 blade was used to make an incision and a subcutaneous port pocket was formed. [place] was assembled. Subcutaneous tunnel was formed with a stiff tunneling device. The port catheter was brought through the subcutaneous tunnel. The port was placed in the subcutaneous pocket. The micropuncture set was exchanged for a peel-away sheath. The catheter was placed through the peel-away sheath and the tip was positioned at the superior cavoatrial junction. Catheter placement was confirmed with fluoroscopy. The port was accessed and flushed with heparinized saline. The port pocket was closed using two layers of absorbable sutures and Dermabond. The vein skin site was closed using a single layer of absorbable suture and skin glue. Sterile dressings were applied. Patient tolerated the procedure well without an immediate complication. Ultrasound and fluoroscopic images were taken and saved for this procedure.  IMPRESSION: Placement of a subcutaneous port device. Catheter tip at the superior cavoatrial junction and ready to be used. Electronically Signed   By: Markus Daft M.D.   On: 03/01/2017 17:05   Ir Fluoro Guide Port Insertion Right  Result Date: 03/01/2017 INDICATION: 66 year old with tongue cancer. Scheduled for Port-A-Cath and gastrostomy tube placement.  EXAM: FLUOROSCOPIC AND ULTRASOUND GUIDED PLACEMENT OF A SUBCUTANEOUS PORT COMPARISON:  None. MEDICATIONS: Ancef 2 g; The antibiotic was administered within an appropriate time interval prior to skin puncture. ANESTHESIA/SEDATION: Versed 3.0 mg IV; Fentanyl 50 mcg IV; Moderate Sedation Time:  22 minutes The patient was continuously monitored during the procedure by the interventional radiology nurse under my direct supervision. FLUOROSCOPY TIME:  18 seconds, 5 mGy COMPLICATIONS: None immediate. PROCEDURE: The procedure, risks, benefits, and alternatives were explained to the patient. Questions regarding the procedure were encouraged and answered. The patient understands and consents to the procedure. Patient was placed supine on the interventional table. Ultrasound confirmed a patent right internal jugular vein. The right chest and neck were cleaned with a skin antiseptic and a sterile drape was placed. Maximal barrier sterile technique was utilized including caps, mask, sterile gowns, sterile gloves, sterile drape, hand hygiene and skin antiseptic. The right neck was anesthetized with 1% lidocaine. Small incision was made in the right neck with a blade. Micropuncture set was placed in the right internal jugular vein with ultrasound guidance. The micropuncture wire was used for measurement purposes. The right chest was anesthetized with 1% lidocaine with epinephrine. #15 blade was used to make an incision and a subcutaneous port pocket was formed. [place] was assembled. Subcutaneous tunnel was formed with a stiff tunneling device. The port catheter was brought through the subcutaneous tunnel. The port was placed in the subcutaneous pocket. The micropuncture set was exchanged for a peel-away sheath. The catheter was placed through the peel-away sheath and the tip was positioned at the superior cavoatrial junction. Catheter placement was confirmed with fluoroscopy. The port was accessed and flushed with  heparinized saline. The port pocket was closed using two layers of absorbable sutures and Dermabond. The vein skin site was closed using a single layer of absorbable suture and skin glue. Sterile dressings were applied. Patient tolerated the procedure well without an immediate complication. Ultrasound and fluoroscopic images were taken and saved for this procedure. IMPRESSION: Placement of a subcutaneous port device. Catheter tip at the superior cavoatrial junction and ready to be used. Electronically Signed   By: Markus Daft M.D.   On: 03/01/2017 17:05    ASSESSMENT & PLAN:  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[facility]) He tolerated chemotherapy well but has started to experience side-effects We will continue weekly treatment and supportive care visits as scheduled I would proceed with same dose chemotherapy without dose adjustment  Mucositis due to antineoplastic therapy He has started to experience mucositis related to side effects of treatment. He has tried Roxanol without much success I recommend increasing the dose of Roxanol for the next 2 days and if not improved, to stop fentanyl I will reassess pain control next week.  Dysphagia He has not eaten much by mouth. I encouraged him to continue to eat and swallow if possible He will continue to use feeding tube as tolerated  Oral candidiasis He has signs of thrush. This is likely contributing to the cause of exacerbation of his mucositis pain I recommend fluconazole for 7 days I will still proceed with treatment without dose adjustment or delay.  The mild leukocytosis is likely related to this and steroid treatment  Neck swelling He has mild neck swelling and was started on some steroid treatment. Steroid likely exacerbated thrush We will continue treatment as above   No orders of the defined types were placed in this encounter.  All questions were answered. The patient knows to call the clinic with any problems, questions or  concerns. No barriers to learning was detected. I spent 25 minutes counseling the patient face to face. The total time spent in the appointment was 30 minutes and more than 50% was on counseling and review of test results     Heath Lark, MD 03/28/2017 10:54 AM

## 2017-03-28 NOTE — Assessment & Plan Note (Addendum)
He has signs of thrush. This is likely contributing to the cause of exacerbation of his mucositis pain I recommend fluconazole for 7 days I will still proceed with treatment without dose adjustment or delay.  The mild leukocytosis is likely related to this and steroid treatment

## 2017-03-29 ENCOUNTER — Ambulatory Visit (HOSPITAL_BASED_OUTPATIENT_CLINIC_OR_DEPARTMENT_OTHER): Payer: No Typology Code available for payment source

## 2017-03-29 ENCOUNTER — Ambulatory Visit
Admission: RE | Admit: 2017-03-29 | Discharge: 2017-03-29 | Disposition: A | Discharge status: Home / Self Care | Payer: Medicare PPO | Source: Ambulatory Visit | Attending: Radiation Oncology | Admitting: Radiation Oncology

## 2017-03-29 ENCOUNTER — Ambulatory Visit: Payer: Medicare PPO | Admitting: Nutrition

## 2017-03-29 VITALS — BP 146/86 | HR 80 | Temp 97.7°F | Resp 18

## 2017-03-29 DIAGNOSIS — Z5111 Encounter for antineoplastic chemotherapy: Secondary | ICD-10-CM | POA: Diagnosis not present

## 2017-03-29 DIAGNOSIS — C021 Malignant neoplasm of border of tongue: Secondary | ICD-10-CM

## 2017-03-29 DIAGNOSIS — G893 Neoplasm related pain (acute) (chronic): Secondary | ICD-10-CM

## 2017-03-29 DIAGNOSIS — Z510 Encounter for antineoplastic radiation therapy: Secondary | ICD-10-CM | POA: Diagnosis not present

## 2017-03-29 MED ORDER — MORPHINE SULFATE (PF) 4 MG/ML IV SOLN
INTRAVENOUS | Status: AC
  Filled 2017-03-29: qty 1

## 2017-03-29 MED ORDER — HYDROMORPHONE HCL 4 MG/ML IJ SOLN
INTRAMUSCULAR | Status: AC
  Filled 2017-03-29: qty 1

## 2017-03-29 MED ORDER — SODIUM CHLORIDE 0.9 % IV SOLN
Freq: Once | INTRAVENOUS | Status: AC
  Administered 2017-03-29: 13:00:00 via INTRAVENOUS
  Filled 2017-03-29: qty 5

## 2017-03-29 MED ORDER — SODIUM CHLORIDE 0.9 % IV SOLN
40.0000 mg/m2 | Freq: Once | INTRAVENOUS | Status: AC
  Administered 2017-03-29: 76 mg via INTRAVENOUS
  Filled 2017-03-29: qty 76

## 2017-03-29 MED ORDER — HEPARIN SOD (PORK) LOCK FLUSH 100 UNIT/ML IV SOLN
500.0000 [IU] | Freq: Once | INTRAVENOUS | Status: AC | PRN
  Administered 2017-03-29: 500 [IU]
  Filled 2017-03-29: qty 5

## 2017-03-29 MED ORDER — MORPHINE SULFATE 4 MG/ML IJ SOLN
2.0000 mg | INTRAMUSCULAR | Status: AC
  Administered 2017-03-29: 2 mg via INTRAVENOUS
  Filled 2017-03-29: qty 1

## 2017-03-29 MED ORDER — PALONOSETRON HCL INJECTION 0.25 MG/5ML
INTRAVENOUS | Status: AC
  Filled 2017-03-29: qty 5

## 2017-03-29 MED ORDER — POTASSIUM CHLORIDE 2 MEQ/ML IV SOLN
Freq: Once | INTRAVENOUS | Status: AC
  Administered 2017-03-29: 11:00:00 via INTRAVENOUS
  Filled 2017-03-29: qty 10

## 2017-03-29 MED ORDER — SODIUM CHLORIDE 0.9% FLUSH
10.0000 mL | INTRAVENOUS | Status: DC | PRN
  Administered 2017-03-29: 10 mL
  Filled 2017-03-29: qty 10

## 2017-03-29 MED ORDER — SODIUM CHLORIDE 0.9 % IV SOLN
Freq: Once | INTRAVENOUS | Status: AC
  Administered 2017-03-29: 11:00:00 via INTRAVENOUS

## 2017-03-29 MED ORDER — HYDROMORPHONE HCL 4 MG/ML IJ SOLN
1.0000 mg | Freq: Once | INTRAMUSCULAR | Status: AC
  Administered 2017-03-29: 1 mg via INTRAVENOUS

## 2017-03-29 MED ORDER — PALONOSETRON HCL INJECTION 0.25 MG/5ML
0.2500 mg | Freq: Once | INTRAVENOUS | Status: AC
  Administered 2017-03-29: 0.25 mg via INTRAVENOUS

## 2017-03-29 NOTE — Patient Instructions (Signed)
[facility] Discharge Instructions for Patients Receiving Chemotherapy  Today you received the following chemotherapy agents: Cisplatin   To help prevent nausea and vomiting after your treatment, we encourage you to take your nausea medication as directed.    If you develop nausea and vomiting that is not controlled by your nausea medication, call the clinic.   BELOW ARE SYMPTOMS THAT SHOULD BE REPORTED IMMEDIATELY:  *FEVER GREATER THAN 100.5 F  *CHILLS WITH OR WITHOUT FEVER  NAUSEA AND VOMITING THAT IS NOT CONTROLLED WITH YOUR NAUSEA MEDICATION  *UNUSUAL SHORTNESS OF BREATH  *UNUSUAL BRUISING OR BLEEDING  TENDERNESS IN MOUTH AND THROAT WITH OR WITHOUT PRESENCE OF ULCERS  *URINARY PROBLEMS  *BOWEL PROBLEMS  UNUSUAL RASH Items with * indicate a potential emergency and should be followed up as soon as possible.  Feel free to call the clinic you have any questions or concerns. The clinic phone number is [phone].  Please show the CHEMO ALERT CARD at check-in to the Emergency Department and triage nurse.   

## 2017-03-29 NOTE — Progress Notes (Signed)
Nutrition follow-up completed with patient during chemotherapy for tongue cancer. Weight is stable and documented as 160.2 pounds. Patient is tolerating 6 bottles of Nutren 1.5 daily without difficulty. He denies nausea, vomiting, constipation or diarrhea.  Nutrition diagnosis: Unintended weight loss improved.  Estimated nutrition needs: 2130-2330 calories, 95-105 g protein, 2.3 L fluid.  Intervention: Patient to continue Nutren 1.5 1-1/2 bottles 4 times a day with 60 mL free water before and after bolus feedings. This provides 2250 cal, 102 g protein, and 1626 mL free water. Patient will continue to drink at least 675 mL of free water daily to meet minimum free water requirements.  Monitoring, evaluation, goals:  Patient will tolerate tube feeding to meet minimum estimated needs for weight maintenance/weight gain.  Next visit: Friday, May 4, during infusion.  **Disclaimer: This note was dictated with voice recognition software. Similar sounding words can inadvertently be transcribed and this note may contain transcription errors which may not have been corrected upon publication of note.**

## 2017-03-29 NOTE — Progress Notes (Deleted)
Per Dr Alen Blew it is okay to treat pt today with nivolumab and serum creatinine of 1.6

## 2017-03-30 ENCOUNTER — Ambulatory Visit: Payer: Medicare PPO

## 2017-04-01 ENCOUNTER — Ambulatory Visit: Payer: No Typology Code available for payment source | Admitting: Physical Therapy

## 2017-04-01 ENCOUNTER — Ambulatory Visit
Admission: RE | Admit: 2017-04-01 | Discharge: 2017-04-01 | Disposition: A | Discharge status: Home / Self Care | Payer: Medicare PPO | Source: Ambulatory Visit | Attending: Radiation Oncology | Admitting: Radiation Oncology

## 2017-04-01 DIAGNOSIS — Z483 Aftercare following surgery for neoplasm: Secondary | ICD-10-CM

## 2017-04-01 DIAGNOSIS — R1310 Dysphagia, unspecified: Secondary | ICD-10-CM | POA: Diagnosis not present

## 2017-04-01 DIAGNOSIS — M25612 Stiffness of left shoulder, not elsewhere classified: Secondary | ICD-10-CM

## 2017-04-01 DIAGNOSIS — I890 Lymphedema, not elsewhere classified: Secondary | ICD-10-CM

## 2017-04-01 DIAGNOSIS — M25611 Stiffness of right shoulder, not elsewhere classified: Secondary | ICD-10-CM

## 2017-04-01 DIAGNOSIS — Z510 Encounter for antineoplastic radiation therapy: Secondary | ICD-10-CM | POA: Diagnosis not present

## 2017-04-01 NOTE — Patient Instructions (Signed)
Manual lymph drainage for the neck, posterior approach with neck scar  Sit in front of a mirror. Do 5 slow deep breaths, breathing in through the nose and out through the mouth, letting your belly "inflate" as you breathe in. Keep your hands on your abdomen and let the hands put a little pressure there as you breathe in and out.  1) Place hands on areas just behind collar bones and do 10 stationary circles with stretch in outward directions. 2) Do stationary circles at each armpit about 10 times. 3) Place one hand on the front of the opposite shoulder and do stationary circles with stretch downward toward underarm. 4) Repeat #1 above. 5) Place one hand just below incision line and stretch skin out and down from there. 6) Imagine a river running in a line from the earlobe straight down the neck.  Place hands just behind this and do circles with stretch coming forward slightly and down, thinking about fluid flowing down that river.  Do 10 times. 7) Place hands behind ears and do circles with pressure downward.  Do 10 times on each side. 8) Place hands at top of ears and do circles with pressure back and down. 9) Place hands between eyes and ears and do circles up and back. 10) Do stationary circles on each side of the face on the cheeks with pressure going up and back, 10 times. 11) Do stationary circles on each side of the chin, again up and back 10 times. 12) Do circles ONE AT A TIME on right side of neck above incision and after that on left side of neck, with pressure up and back, 20 times. 13) Repeat steps 11, 10, 9, 8, 7, 6, 1, 3 and 2 in that order!  Do not slide on the skin, but STRETCH it with your motions. Only give enough pressure to stretch the skin.  DO THIS SLOWLY, PLEASE!  And do once a day.
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
## 2017-04-02 ENCOUNTER — Ambulatory Visit
Admission: RE | Admit: 2017-04-02 | Discharge: 2017-04-02 | Disposition: A | Discharge status: Home / Self Care | Payer: Medicare PPO | Source: Ambulatory Visit | Attending: Radiation Oncology | Admitting: Radiation Oncology

## 2017-04-02 DIAGNOSIS — Z510 Encounter for antineoplastic radiation therapy: Secondary | ICD-10-CM | POA: Diagnosis not present

## 2017-04-03 ENCOUNTER — Ambulatory Visit: Payer: Medicare PPO | Admitting: Physical Therapy

## 2017-04-03 ENCOUNTER — Other Ambulatory Visit (HOSPITAL_BASED_OUTPATIENT_CLINIC_OR_DEPARTMENT_OTHER): Payer: No Typology Code available for payment source

## 2017-04-03 ENCOUNTER — Ambulatory Visit
Admission: RE | Admit: 2017-04-03 | Discharge: 2017-04-03 | Disposition: A | Discharge status: Home / Self Care | Payer: Medicare PPO | Source: Ambulatory Visit | Attending: Radiation Oncology | Admitting: Radiation Oncology

## 2017-04-03 DIAGNOSIS — Z510 Encounter for antineoplastic radiation therapy: Secondary | ICD-10-CM | POA: Diagnosis not present

## 2017-04-03 DIAGNOSIS — C021 Malignant neoplasm of border of tongue: Secondary | ICD-10-CM

## 2017-04-03 LAB — CBC WITH DIFFERENTIAL/PLATELET
BASO%: 0.4 % (ref 0.0–2.0)
Basophils Absolute: 0 10*3/uL (ref 0.0–0.1)
EOS%: 2.5 % (ref 0.0–7.0)
Eosinophils Absolute: 0.2 10*3/uL (ref 0.0–0.5)
HEMATOCRIT: 36 % — AB (ref 38.4–49.9)
HEMOGLOBIN: 12.1 g/dL — AB (ref 13.0–17.1)
LYMPH#: 0.4 10*3/uL — AB (ref 0.9–3.3)
LYMPH%: 6.2 % — ABNORMAL LOW (ref 14.0–49.0)
MCH: 32.5 pg (ref 27.2–33.4)
MCHC: 33.7 g/dL (ref 32.0–36.0)
MCV: 96.5 fL (ref 79.3–98.0)
MONO#: 0.6 10*3/uL (ref 0.1–0.9)
MONO%: 9.3 % (ref 0.0–14.0)
NEUT%: 81.6 % — AB (ref 39.0–75.0)
NEUTROS ABS: 5.6 10*3/uL (ref 1.5–6.5)
PLATELETS: 260 10*3/uL (ref 140–400)
RBC: 3.72 10*6/uL — ABNORMAL LOW (ref 4.20–5.82)
RDW: 12.8 % (ref 11.0–14.6)
WBC: 6.9 10*3/uL (ref 4.0–10.3)

## 2017-04-03 LAB — COMPREHENSIVE METABOLIC PANEL
ALT: 34 U/L (ref 0–55)
AST: 25 U/L (ref 5–34)
Albumin: 3.4 g/dL — ABNORMAL LOW (ref 3.5–5.0)
Alkaline Phosphatase: 86 U/L (ref 40–150)
Anion Gap: 12 mEq/L — ABNORMAL HIGH (ref 3–11)
BUN: 31.1 mg/dL — AB (ref 7.0–26.0)
CALCIUM: 9.7 mg/dL (ref 8.4–10.4)
CHLORIDE: 97 meq/L — AB (ref 98–109)
CO2: 29 mEq/L (ref 22–29)
Creatinine: 0.9 mg/dL (ref 0.7–1.3)
EGFR: 90 mL/min/{1.73_m2} — ABNORMAL LOW (ref >90)
GLUCOSE: 97 mg/dL (ref 70–140)
POTASSIUM: 4.5 meq/L (ref 3.5–5.1)
SODIUM: 138 meq/L (ref 136–145)
Total Bilirubin: 0.75 mg/dL (ref 0.20–1.20)
Total Protein: 7.4 g/dL (ref 6.4–8.3)

## 2017-04-03 LAB — MAGNESIUM: Magnesium: 1.7 mg/dl (ref 1.5–2.5)

## 2017-04-04 ENCOUNTER — Ambulatory Visit
Admission: RE | Admit: 2017-04-04 | Discharge: 2017-04-04 | Disposition: A | Discharge status: Home / Self Care | Payer: Medicare PPO | Source: Ambulatory Visit | Attending: Radiation Oncology | Admitting: Radiation Oncology

## 2017-04-04 ENCOUNTER — Encounter: Payer: Self-pay | Admitting: Hematology and Oncology

## 2017-04-04 ENCOUNTER — Ambulatory Visit (HOSPITAL_BASED_OUTPATIENT_CLINIC_OR_DEPARTMENT_OTHER): Payer: No Typology Code available for payment source | Admitting: Hematology and Oncology

## 2017-04-04 DIAGNOSIS — K1231 Oral mucositis (ulcerative) due to antineoplastic therapy: Secondary | ICD-10-CM

## 2017-04-04 DIAGNOSIS — R634 Abnormal weight loss: Secondary | ICD-10-CM | POA: Diagnosis not present

## 2017-04-04 DIAGNOSIS — R1310 Dysphagia, unspecified: Secondary | ICD-10-CM

## 2017-04-04 DIAGNOSIS — C021 Malignant neoplasm of border of tongue: Secondary | ICD-10-CM

## 2017-04-04 DIAGNOSIS — Z510 Encounter for antineoplastic radiation therapy: Secondary | ICD-10-CM | POA: Diagnosis not present

## 2017-04-04 NOTE — Assessment & Plan Note (Signed)
He has progressive weight loss since surgery and chemo We discussed the importance of increasing nutritional supplement as tolerated

## 2017-04-04 NOTE — Assessment & Plan Note (Signed)
He has not eaten much by mouth. I encouraged him to continue to eat and swallow if possible He will continue to use feeding tube as tolerated With weight loss, I recommend he advance the amount of nutritional supplement through the feeding tube He will continue close follow-up with dietitian

## 2017-04-04 NOTE — Assessment & Plan Note (Signed)
He tolerated chemotherapy well but has started to experience worsening side-effects We will continue weekly treatment and supportive care visits as scheduled I would proceed with same dose chemotherapy without dose adjustment

## 2017-04-04 NOTE — Progress Notes (Signed)
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 This caused him significant discomfort, resulting in decreased food intake.      12/28/2016 Imaging    CT of the neck on 12/28/16 had demonstrate no frank adenopathy in the neck and the tongue was not well visualized die to dental work. On the same day a CT of his chest showed emphysema of the lungs and a few nonspecific pulmonary nodules which were unchanged compared to 06/2015.      01/15/2017 PET scan    This revealed hypermetabolic activity in the left anterior tongue as well as within a small left level 2 node and small nodules in the left parotid gland. No evidence of metastatic disease distantly      01/28/2017 Pathology Results    A.TONGUE, LEFT PARTIAL GLOSSECTOMY: Invasive moderate to poorly-differentiated squamous cell carcinoma, conventional type, 2.5 cm in greatest dimension on gross examination. Invasive squamous cell carcinoma shows greatest depth of invasion of 1.5 cm. Margins uninvolved by invasive tumor (invasive squamous cell carcinoma comes to within 0.6 cm of the inked posterior margin). Negative for lymphovascular invasion Negative for perineural invasion. AJCC Pathologic Stage: pT3 pN3 pM- not applicable. See Cancer Case Summary.  SURGICAL PATHOLOGY CANCER CASE SUMMARY- LIP AND ORAL CAVITY Protocol posting date: June 2017 Procedure: Partial glossectomy Tumor site: Oral, tongue Tumor laterality: left Tumor focality: unifocal Tumor size: Greatest dimension 2.5 cm on gross examination Histologic type: Squamous cell carcinoma, conventional Histologic grade: G2-G3, moderate to poorly differentiated Specimen margins:  Uninvolved by invasive tumor Distance from closest margin: 6 mm (0.6 cm) from posterior margin Lymphovascular invasion: Not identified Perineural invasion: Not identified Regional lymph nodes: Number of lymph nodes involved: 3 Number of lymph nodes examined: 57 Laterality of lymph nodes involved: Bilateral Size of largest metastatic deposit: 9 mm (0.9 cm) Extranodal extension: very focally present in 1 lymph node (part J) Pathologic Stage Classification (pTNM, AJCC 8th Edition) Primary tumor (pT): pT3 Regional lymph nodes (pN): pN3 Distant metastasis (pM): not applicable   B.ANTERIOR DORSAL TONGUE, BIOPSY: Negative for malignancy.  C.POSTERIOR DORSAL TONGUE, BIOPSY: Negative for malignancy.  D.MID DORSAL TONGUE, BIOPSY: Negative for malignancy.  E.DEEP TONGUE, BIOPSY: Negative for malignancy.  F.ANTERIOR FLOOR OF MOUTH, BIOPSY: Negative for malignancy.  G.POSTERIOR FLOOR OF MOUTH, BIOPSY: Negative for malignancy.  H.LEVEL IA, RESECTION: Six benign lymph nodes negative for metastatic carcinoma on routine H&E staining (0/6).  I.LEVEL IB, RESECTION: Two benign lymph nodes negative for metastatic carcinoma on routine H&E staining (0/2). Benign salivary gland tissue.  J.LEFT LEVEL IIA , 3 AND 4, RESECTION: One of twenty-one lymph nodes positive for metastatic carcinoma (0.9 cm focus) with very focal extranodal extension on routine H&E staining (1/21).  K.LEVEL IIB, RESECTION: Four benign lymph nodes negative for metastatic carcinoma on routine H&E staining (0/4). One small focus of benign salivary gland tissue.  L.RIGHT LEVEL IB, RESECTION: Three benign lymph nodes negative for  metastatic carcinoma on routine H&E staining (0/3). Benign salivary gland tissue.  M.RIGHT LEVEL 2A, 3, 4, RESECTION: Two of fifteen lymph nodes positive for metastatic carcinoma (2/15). See comment.  Comment: A small focus suspicious for metastatic carcinoma is seen on the initial H&E stained slide. Therefore, a pankeratin immunohistochemical stain  is performed. The positive control stain functioned appropriately. The pankeratin immunostain highlights a few minute foci of metastatic carcinoma in 2 of the 3 lymph nodes (largest focus 0.2 mm [0.02 cm]).      01/28/2017 Surgery    He had surgery at [facility] <100 SQCM [15100] (SPLIT THICKNESS SKIN GRAFT LEG)           03/01/2017 Procedure    Successful fluoroscopic guided percutaneous gastrostomy tube placement.      03/01/2017 Procedure    Placement of a subcutaneous port device. Catheter tip at the superior cavoatrial junction and ready to be used.      03/12/2017 -  Radiation Therapy    He received radiation treatment       03/13/2017 Procedure    Baseline hearing is near normal      03/15/2017 -  Chemotherapy    He received cisplatin       INTERVAL HISTORY: Please see below for problem oriented charting. He returns for his weekly supportive care visit Mucositis is worse with lip bleeding. Pain control is stable with the addition of liquid morphine sulfate and fentanyl as needed He denies nausea, vomiting or constipation He had progressive mucositis with lip bleeding Candidiasis had resolved He denies neuropathy.  REVIEW OF SYSTEMS:   Constitutional: Denies fevers, chills or abnormal weight loss Eyes: Denies blurriness of vision Respiratory: Denies cough, dyspnea or wheezes Cardiovascular: Denies palpitation, chest discomfort or lower extremity swelling Gastrointestinal:  Denies nausea,  heartburn or change in bowel habits Skin: Denies abnormal skin rashes Lymphatics: Denies new lymphadenopathy or easy bruising Neurological:Denies numbness, tingling or new weaknesses Behavioral/Psych: Mood is stable, no new changes  All other systems were reviewed with the patient and are negative.  I have reviewed the past medical history, past surgical history, social history and family history with the patient and they are unchanged from previous note.  ALLERGIES:  is allergic to latex; adhesive [tape]; and codeine.  MEDICATIONS:  Current Outpatient Prescriptions  Medication Sig Dispense Refill  . aspirin 81 MG chewable tablet Chew 81 mg by mouth every morning.     Marland Kitchen dexamethasone (DECADRON) 4 MG tablet Taper as directed, take with food. 60 tablet 0  . fluconazole (DIFLUCAN) 100 MG tablet Take 1 tablet (100 mg total) by mouth daily. 7 tablet 0  . lidocaine-prilocaine (EMLA) cream Apply to affected area once 30 g 3  . morphine (ROXANOL) 20 MG/ML concentrated solution Take 0.5 mLs (10 mg total) by mouth every 2 (two) hours as needed for severe pain. 120 mL 0  . fentaNYL (DURAGESIC - DOSED MCG/HR) 25 MCG/HR patch Place 1 patch (25 mcg total) onto the skin every 3 (three) days. (Patient not taking: Reported on 03/28/2017) 5 patch 0  . magic mouthwash w/lidocaine SOLN Take 5 mLs by mouth 4 (four) times daily as needed for mouth pain. (Patient not taking: Reported on 03/28/2017) 240 mL 0  . ondansetron (ZOFRAN) 8 MG tablet Take 1 tablet (8 mg total) by mouth every 8 (eight) hours as needed. Start on the third day after chemotherapy. (Patient not taking: Reported on 03/21/2017) 30 tablet 1  . prochlorperazine (COMPAZINE) 10 MG tablet Take 1 tablet (10 mg total) by mouth every 6 (six) hours as needed (Nausea or vomiting). (Patient not taking: Reported on 03/21/2017) 30 tablet 1   No current facility-administered medications for this visit.     PHYSICAL EXAMINATION: ECOG  PERFORMANCE STATUS: 1 -  Symptomatic but completely ambulatory  Vitals:   04/04/17 1043  BP: 108/66  Pulse: 95  Resp: 17  Temp: 97.7 F (36.5 C)   Filed Weights   04/04/17 1043  Weight: 156 lb 1.6 oz (70.8 kg)    GENERAL:alert, no distress and comfortable SKIN: Significant dermatitis/rash around his neck is noted EYES: normal, Conjunctiva are pink and non-injected, sclera clear OROPHARYNX: Noted significant mucositis.  No thrush.  Lip bleeding is noted NECK: supple, thyroid normal size, non-tender, without nodularity LYMPH:  no palpable lymphadenopathy in the cervical, axillary or inguinal LUNGS: clear to auscultation and percussion with normal breathing effort HEART: regular rate & rhythm and no murmurs and no lower extremity edema ABDOMEN:abdomen soft, non-tender and normal bowel sounds Musculoskeletal:no cyanosis of digits and no clubbing  NEURO: alert & oriented x 3 with fluent speech, no focal motor/sensory deficits  LABORATORY DATA:  I have reviewed the data as listed    Component Value Date/Time   NA 138 04/03/2017 0856   K 4.5 04/03/2017 0856   CL 99 02/04/2015 1419   CO2 29 04/03/2017 0856   GLUCOSE 97 04/03/2017 0856   BUN 31.1 (H) 04/03/2017 0856   CREATININE 0.9 04/03/2017 0856   CALCIUM 9.7 04/03/2017 0856   PROT 7.4 04/03/2017 0856   ALBUMIN 3.4 (L) 04/03/2017 0856   AST 25 04/03/2017 0856   ALT 34 04/03/2017 0856   ALKPHOS 86 04/03/2017 0856   BILITOT 0.75 04/03/2017 0856   GFRNONAA >90 07/24/2014 1220   GFRAA >90 07/24/2014 1220    No results found for: SPEP, UPEP  Lab Results  Component Value Date   WBC 6.9 04/03/2017   NEUTROABS 5.6 04/03/2017   HGB 12.1 (L) 04/03/2017   HCT 36.0 (L) 04/03/2017   MCV 96.5 04/03/2017   PLT 260 04/03/2017      Chemistry      Component Value Date/Time   NA 138 04/03/2017 0856   K 4.5 04/03/2017 0856   CL 99 02/04/2015 1419   CO2 29 04/03/2017 0856   BUN 31.1 (H) 04/03/2017 0856   CREATININE 0.9 04/03/2017 0856       Component Value Date/Time   CALCIUM 9.7 04/03/2017 0856   ALKPHOS 86 04/03/2017 0856   AST 25 04/03/2017 0856   ALT 34 04/03/2017 0856   BILITOT 0.75 04/03/2017 0856      ASSESSMENT & PLAN:  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[place]) He tolerated chemotherapy well but has started to experience worsening side-effects We will continue weekly treatment and supportive care visits as scheduled I would proceed with same dose chemotherapy without dose adjustment  Mucositis due to antineoplastic therapy Mucositis is worse Candidiasis that resolved I would defer to her radiation oncologist to decide whether it is appropriate to continue radiation treatment For chemotherapy, we will proceed without dose adjustment His pain is better control with addition of liquid morphine with fentanyl patch as needed Continue the same  Dysphagia He has not eaten much by mouth. I encouraged him to continue to eat and swallow if possible He will continue to use feeding tube as tolerated With weight loss, I recommend he advance the amount of nutritional supplement through the feeding tube He will continue close follow-up with dietitian  Weight loss He has progressive weight loss since surgery and chemo We discussed the importance of increasing nutritional supplement as tolerated   No orders of the defined types were placed in this encounter.  All questions were  answered. The patient knows to call the clinic with any problems, questions or concerns. No barriers to learning was detected. I spent 15 minutes counseling the patient face to face. The total time spent in the appointment was 20 minutes and more than 50% was on counseling and review of test results     Heath Lark, MD 04/04/2017 11:21 AM

## 2017-04-04 NOTE — Assessment & Plan Note (Addendum)
Mucositis is worse Candidiasis that resolved I would defer to her radiation oncologist to decide whether it is appropriate to continue radiation treatment For chemotherapy, we will proceed without dose adjustment His pain is better control with addition of liquid morphine with fentanyl patch as needed Continue the same

## 2017-04-05 ENCOUNTER — Ambulatory Visit
Admission: RE | Admit: 2017-04-05 | Discharge: 2017-04-05 | Disposition: A | Discharge status: Home / Self Care | Payer: Medicare PPO | Source: Ambulatory Visit | Attending: Radiation Oncology | Admitting: Radiation Oncology

## 2017-04-05 ENCOUNTER — Ambulatory Visit (HOSPITAL_BASED_OUTPATIENT_CLINIC_OR_DEPARTMENT_OTHER): Payer: No Typology Code available for payment source

## 2017-04-05 ENCOUNTER — Ambulatory Visit: Payer: Medicare PPO | Admitting: Nutrition

## 2017-04-05 VITALS — BP 115/80 | HR 91 | Temp 97.5°F | Resp 17

## 2017-04-05 DIAGNOSIS — C021 Malignant neoplasm of border of tongue: Secondary | ICD-10-CM | POA: Diagnosis not present

## 2017-04-05 DIAGNOSIS — Z5111 Encounter for antineoplastic chemotherapy: Secondary | ICD-10-CM

## 2017-04-05 DIAGNOSIS — Z95828 Presence of other vascular implants and grafts: Secondary | ICD-10-CM

## 2017-04-05 DIAGNOSIS — B370 Candidal stomatitis: Secondary | ICD-10-CM

## 2017-04-05 DIAGNOSIS — Z510 Encounter for antineoplastic radiation therapy: Secondary | ICD-10-CM | POA: Diagnosis not present

## 2017-04-05 MED ORDER — PALONOSETRON HCL INJECTION 0.25 MG/5ML
0.2500 mg | Freq: Once | INTRAVENOUS | Status: AC
  Administered 2017-04-05: 0.25 mg via INTRAVENOUS

## 2017-04-05 MED ORDER — HEPARIN SOD (PORK) LOCK FLUSH 100 UNIT/ML IV SOLN
500.0000 [IU] | Freq: Once | INTRAVENOUS | Status: AC | PRN
  Administered 2017-04-05: 500 [IU]
  Filled 2017-04-05: qty 5

## 2017-04-05 MED ORDER — FOSAPREPITANT DIMEGLUMINE INJECTION 150 MG
Freq: Once | INTRAVENOUS | Status: AC
  Administered 2017-04-05: 13:00:00 via INTRAVENOUS
  Filled 2017-04-05: qty 5

## 2017-04-05 MED ORDER — SODIUM CHLORIDE 0.9% FLUSH
10.0000 mL | INTRAVENOUS | Status: DC | PRN
  Filled 2017-04-05: qty 10

## 2017-04-05 MED ORDER — SODIUM CHLORIDE 0.9 % IV SOLN
Freq: Once | INTRAVENOUS | Status: AC
  Administered 2017-04-05: 13:00:00 via INTRAVENOUS

## 2017-04-05 MED ORDER — HYDROMORPHONE HCL 4 MG/ML IJ SOLN
INTRAMUSCULAR | Status: AC
  Filled 2017-04-05: qty 1

## 2017-04-05 MED ORDER — HYDROMORPHONE HCL 4 MG/ML IJ SOLN
2.0000 mg | Freq: Once | INTRAMUSCULAR | Status: AC
  Administered 2017-04-05: 2 mg via INTRAVENOUS

## 2017-04-05 MED ORDER — SODIUM CHLORIDE 0.9 % IV SOLN
40.0000 mg/m2 | Freq: Once | INTRAVENOUS | Status: AC
  Administered 2017-04-05: 76 mg via INTRAVENOUS
  Filled 2017-04-05: qty 76

## 2017-04-05 MED ORDER — HEPARIN SOD (PORK) LOCK FLUSH 100 UNIT/ML IV SOLN
250.0000 [IU] | Freq: Once | INTRAVENOUS | Status: DC | PRN
  Filled 2017-04-05: qty 5

## 2017-04-05 MED ORDER — LIDOCAINE-PRILOCAINE 2.5-2.5 % EX CREA
TOPICAL_CREAM | Freq: Once | CUTANEOUS | Status: AC
  Administered 2017-04-05: 10:00:00 via TOPICAL
  Filled 2017-04-05: qty 5

## 2017-04-05 MED ORDER — PALONOSETRON HCL INJECTION 0.25 MG/5ML
INTRAVENOUS | Status: AC
  Filled 2017-04-05: qty 5

## 2017-04-05 MED ORDER — HYDROMORPHONE HCL 4 MG/ML IJ SOLN
1.0000 mg | Freq: Once | INTRAMUSCULAR | Status: AC
  Administered 2017-04-05: 1 mg via INTRAVENOUS

## 2017-04-05 MED ORDER — POTASSIUM CHLORIDE 2 MEQ/ML IV SOLN
Freq: Once | INTRAVENOUS | Status: AC
  Administered 2017-04-05: 11:00:00 via INTRAVENOUS
  Filled 2017-04-05: qty 10

## 2017-04-05 MED ORDER — SODIUM CHLORIDE 0.9% FLUSH
10.0000 mL | INTRAVENOUS | Status: DC | PRN
  Administered 2017-04-05: 10 mL
  Filled 2017-04-05: qty 10

## 2017-04-05 MED ORDER — ALTEPLASE 2 MG IJ SOLR
2.0000 mg | Freq: Once | INTRAMUSCULAR | Status: DC | PRN
  Filled 2017-04-05: qty 2

## 2017-04-05 MED ORDER — HEPARIN SOD (PORK) LOCK FLUSH 100 UNIT/ML IV SOLN
500.0000 [IU] | Freq: Once | INTRAVENOUS | Status: DC | PRN
  Filled 2017-04-05: qty 5

## 2017-04-05 NOTE — Patient Instructions (Signed)
[facility] Discharge Instructions for Patients Receiving Chemotherapy  Today you received the following chemotherapy agents Cisplatin To help prevent nausea and vomiting after your treatment, we encourage you to take your nausea medication as prescribed.    If you develop nausea and vomiting that is not controlled by your nausea medication, call the clinic.   BELOW ARE SYMPTOMS THAT SHOULD BE REPORTED IMMEDIATELY:  *FEVER GREATER THAN 100.5 F  *CHILLS WITH OR WITHOUT FEVER  NAUSEA AND VOMITING THAT IS NOT CONTROLLED WITH YOUR NAUSEA MEDICATION  *UNUSUAL SHORTNESS OF BREATH  *UNUSUAL BRUISING OR BLEEDING  TENDERNESS IN MOUTH AND THROAT WITH OR WITHOUT PRESENCE OF ULCERS  *URINARY PROBLEMS  *BOWEL PROBLEMS  UNUSUAL RASH Items with * indicate a potential emergency and should be followed up as soon as possible.  Feel free to call the clinic you have any questions or concerns. The clinic phone number is [phone].  Please show the CHEMO ALERT CARD at check-in to the Emergency Department and triage nurse.   

## 2017-04-05 NOTE — Progress Notes (Signed)
Nutrition follow-up completed with patient during infusion for tongue cancer. Weight decreased and documented as 156.1 pounds May 3, down from 160.2 pounds. Patient is not tolerating goal rate of 6 bottles of Nutren 1.5. He reports cramping after tube feeding administration. He reports he thinks he is administering his tube feeding too quickly.  Nutrition diagnosis: Unintended weight loss continues.  Estimated nutrition needs: 2130-2330 calories, 95-105 grams protein, 2.3 L fluid.  Intervention: Provided education for patient to increase tube feeding to goal rate while administering bolus feeds slowly with 60 cc free water before and after bolus feeding. Patient encouraged to continue to drink at least 675 mL free water daily. Teach back method used.  Monitoring, evaluation, goals: Patient will work to increase tube feeding to meet minimum estimated nutrition needs.  Next visit: Friday the 11th in the infusion room.  **Disclaimer: This note was dictated with voice recognition software. Similar sounding words can inadvertently be transcribed and this note may contain transcription errors which may not have been corrected upon publication of note.**

## 2017-04-06 ENCOUNTER — Ambulatory Visit: Payer: Medicare PPO

## 2017-04-08 ENCOUNTER — Ambulatory Visit: Payer: Medicare PPO | Attending: Radiation Oncology

## 2017-04-08 ENCOUNTER — Ambulatory Visit: Payer: Medicare PPO | Admitting: Physical Therapy

## 2017-04-08 ENCOUNTER — Ambulatory Visit
Admission: RE | Admit: 2017-04-08 | Discharge: 2017-04-08 | Disposition: A | Discharge status: Home / Self Care | Payer: Medicare PPO | Source: Ambulatory Visit | Attending: Radiation Oncology | Admitting: Radiation Oncology

## 2017-04-08 DIAGNOSIS — Z510 Encounter for antineoplastic radiation therapy: Secondary | ICD-10-CM | POA: Diagnosis not present

## 2017-04-08 DIAGNOSIS — R1310 Dysphagia, unspecified: Secondary | ICD-10-CM
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
## 2017-04-09 ENCOUNTER — Ambulatory Visit
Admission: RE | Admit: 2017-04-09 | Discharge: 2017-04-09 | Disposition: A | Discharge status: Home / Self Care | Payer: Medicare PPO | Source: Ambulatory Visit | Attending: Radiation Oncology | Admitting: Radiation Oncology

## 2017-04-09 DIAGNOSIS — Z510 Encounter for antineoplastic radiation therapy: Secondary | ICD-10-CM | POA: Diagnosis not present

## 2017-04-10 ENCOUNTER — Other Ambulatory Visit (HOSPITAL_BASED_OUTPATIENT_CLINIC_OR_DEPARTMENT_OTHER): Payer: No Typology Code available for payment source

## 2017-04-10 ENCOUNTER — Ambulatory Visit
Admission: RE | Admit: 2017-04-10 | Discharge: 2017-04-10 | Disposition: A | Discharge status: Home / Self Care | Payer: Medicare PPO | Source: Ambulatory Visit | Attending: Radiation Oncology | Admitting: Radiation Oncology

## 2017-04-10 ENCOUNTER — Telehealth: Payer: Self-pay

## 2017-04-10 ENCOUNTER — Other Ambulatory Visit: Payer: Self-pay | Admitting: *Deleted

## 2017-04-10 ENCOUNTER — Ambulatory Visit: Payer: Medicare PPO | Admitting: Physical Therapy

## 2017-04-10 ENCOUNTER — Other Ambulatory Visit: Payer: Self-pay | Admitting: Hematology and Oncology

## 2017-04-10 DIAGNOSIS — C021 Malignant neoplasm of border of tongue: Secondary | ICD-10-CM | POA: Diagnosis not present

## 2017-04-10 DIAGNOSIS — C029 Malignant neoplasm of tongue, unspecified: Secondary | ICD-10-CM

## 2017-04-10 DIAGNOSIS — R634 Abnormal weight loss: Secondary | ICD-10-CM

## 2017-04-10 DIAGNOSIS — Z510 Encounter for antineoplastic radiation therapy: Secondary | ICD-10-CM | POA: Diagnosis not present

## 2017-04-10 LAB — CBC WITH DIFFERENTIAL/PLATELET
BASO%: 0.6 % (ref 0.0–2.0)
BASOS ABS: 0 10*3/uL (ref 0.0–0.1)
EOS ABS: 0.2 10*3/uL (ref 0.0–0.5)
EOS%: 4.4 % (ref 0.0–7.0)
HCT: 33.7 % — ABNORMAL LOW (ref 38.4–49.9)
HGB: 11.3 g/dL — ABNORMAL LOW (ref 13.0–17.1)
LYMPH%: 7.9 % — AB (ref 14.0–49.0)
MCH: 32.3 pg (ref 27.2–33.4)
MCHC: 33.6 g/dL (ref 32.0–36.0)
MCV: 95.9 fL (ref 79.3–98.0)
MONO#: 0.5 10*3/uL (ref 0.1–0.9)
MONO%: 9.3 % (ref 0.0–14.0)
NEUT#: 4 10*3/uL (ref 1.5–6.5)
NEUT%: 77.8 % — ABNORMAL HIGH (ref 39.0–75.0)
Platelets: 180 10*3/uL (ref 140–400)
RBC: 3.52 10*6/uL — ABNORMAL LOW (ref 4.20–5.82)
RDW: 12.9 % (ref 11.0–14.6)
WBC: 5.2 10*3/uL (ref 4.0–10.3)
lymph#: 0.4 10*3/uL — ABNORMAL LOW (ref 0.9–3.3)

## 2017-04-10 LAB — COMPREHENSIVE METABOLIC PANEL
ALBUMIN: 3.4 g/dL — AB (ref 3.5–5.0)
ALK PHOS: 74 U/L (ref 40–150)
ALT: 38 U/L (ref 0–55)
ANION GAP: 12 meq/L — AB (ref 3–11)
AST: 29 U/L (ref 5–34)
BILIRUBIN TOTAL: 0.66 mg/dL (ref 0.20–1.20)
BUN: 34.1 mg/dL — ABNORMAL HIGH (ref 7.0–26.0)
CO2: 29 meq/L (ref 22–29)
Calcium: 9.6 mg/dL (ref 8.4–10.4)
Chloride: 94 mEq/L — ABNORMAL LOW (ref 98–109)
Creatinine: 1.1 mg/dL (ref 0.7–1.3)
EGFR: 74 mL/min/{1.73_m2} — AB (ref >90)
Glucose: 94 mg/dl (ref 70–140)
Potassium: 4.1 mEq/L (ref 3.5–5.1)
Sodium: 135 mEq/L — ABNORMAL LOW (ref 136–145)
TOTAL PROTEIN: 7.6 g/dL (ref 6.4–8.3)

## 2017-04-10 LAB — MAGNESIUM: Magnesium: 1.4 mg/dl — CL (ref 1.5–2.5)

## 2017-04-10 LAB — TSH: TSH: 0.495 m(IU)/L (ref 0.320–4.118)

## 2017-04-10 MED ORDER — MAGNESIUM OXIDE 400 (241.3 MG) MG PO TABS: 400.0000 mg | ORAL_TABLET | Freq: Two times a day (BID) | ORAL | 9 refills | Status: DC

## 2017-04-10 NOTE — Progress Notes (Signed)
[name] presented to nursing after his radiation treatment today. He reports increased pain to his PEG site. He tells me that the pain occurs with movement, adjusting of the feeding tube, and while using it for feedings. There is faint redness present to the bottom of the PEG site. There is no drainage present. He currently has a mesh wrap over the site and around his abdomen, but no dressing is in place. Gayleen Orem RN navigator came to assess the site. He recommended evaluation by Interventional Radiology due to new onset of pain at the site. I called Interventional Radiology and they were not able to see him until 3:00 today. [name] declined this appointment. Interventional Radiology scheduled him to come see them after his Radiation and Medical Oncologist appointments tomorrow. He is agreeable to this plan and was provided direction to Radiology for tomorrow.

## 2017-04-10 NOTE — Telephone Encounter (Signed)
Called with below message, verbalized understanding. 

## 2017-04-10 NOTE — Telephone Encounter (Signed)
-----   Message from Heath Lark, MD sent at 04/10/2017  9:20 AM EDT ----- Regarding: low mag I escribed to [facility] Start magnesium BID ----- Message ----- From: Interface, Lab In Three Zero One Sent: 04/10/2017   8:49 AM To: Heath Lark, MD

## 2017-04-11 ENCOUNTER — Other Ambulatory Visit: Payer: Self-pay | Admitting: Radiation Oncology

## 2017-04-11 ENCOUNTER — Ambulatory Visit (HOSPITAL_BASED_OUTPATIENT_CLINIC_OR_DEPARTMENT_OTHER): Payer: No Typology Code available for payment source | Admitting: Hematology and Oncology

## 2017-04-11 ENCOUNTER — Ambulatory Visit
Admission: RE | Admit: 2017-04-11 | Discharge: 2017-04-11 | Disposition: A | Discharge status: Home / Self Care | Payer: Medicare PPO | Source: Ambulatory Visit | Attending: Radiation Oncology | Admitting: Radiation Oncology

## 2017-04-11 ENCOUNTER — Encounter: Payer: Self-pay | Admitting: Hematology and Oncology

## 2017-04-11 ENCOUNTER — Ambulatory Visit (HOSPITAL_COMMUNITY)
Admission: RE | Admit: 2017-04-11 | Discharge: 2017-04-11 | Disposition: A | Discharge status: Home / Self Care | Payer: No Typology Code available for payment source | Source: Ambulatory Visit | Attending: Radiation Oncology | Admitting: Radiation Oncology

## 2017-04-11 DIAGNOSIS — C021 Malignant neoplasm of border of tongue: Secondary | ICD-10-CM | POA: Diagnosis not present

## 2017-04-11 DIAGNOSIS — R634 Abnormal weight loss: Secondary | ICD-10-CM | POA: Diagnosis not present

## 2017-04-11 DIAGNOSIS — K1231 Oral mucositis (ulcerative) due to antineoplastic therapy: Secondary | ICD-10-CM | POA: Diagnosis not present

## 2017-04-11 DIAGNOSIS — Z510 Encounter for antineoplastic radiation therapy: Secondary | ICD-10-CM | POA: Diagnosis not present

## 2017-04-11 HISTORY — PX: IR PATIENT EVAL TECH 0-60 MINS: IMG5564

## 2017-04-11 NOTE — Assessment & Plan Note (Signed)
He has low magnesium likely due to chemo I recommend magnesium replacement therapy

## 2017-04-11 NOTE — Assessment & Plan Note (Signed)
He tolerated chemotherapy well but has started to experience worsening side-effects We will continue weekly treatment and supportive care visits as scheduled I would proceed with same dose chemotherapy without dose adjustment

## 2017-04-11 NOTE — Assessment & Plan Note (Signed)
Mucositis is stable Candidiasis that resolved His pain is better control with addition of liquid morphine with fentanyl patch as needed Continue the same

## 2017-04-11 NOTE — Progress Notes (Signed)
Patient ID: [name], male   DOB: [birth year], 66 y.o.   MRN: [id number] 2 T-tac sutures removed from pt's G tube site. Tube is intact, functioning ok, some mild erythema but no sig drainage at entry site; mildly tender; neosporin ointment applied to site; drain sponge gauze applied as well. Pt told to call IR with any questions.

## 2017-04-11 NOTE — Progress Notes (Signed)
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 This caused him significant discomfort, resulting in decreased food intake.      12/28/2016 Imaging    CT of the neck on 12/28/16 had demonstrate no frank adenopathy in the neck and the tongue was not well visualized die to dental work. On the same day a CT of his chest showed emphysema of the lungs and a few nonspecific pulmonary nodules which were unchanged compared to 06/2015.      01/15/2017 PET scan    This revealed hypermetabolic activity in the left anterior tongue as well as within a small left level 2 node and small nodules in the left parotid gland. No evidence of metastatic disease distantly      01/28/2017 Pathology Results    A.TONGUE, LEFT PARTIAL GLOSSECTOMY: Invasive moderate to poorly-differentiated squamous cell carcinoma, conventional type, 2.5 cm in greatest dimension on gross examination. Invasive squamous cell carcinoma shows greatest depth of invasion of 1.5 cm. Margins uninvolved by invasive tumor (invasive squamous cell carcinoma comes to within 0.6 cm of the inked posterior margin). Negative for lymphovascular invasion Negative for perineural invasion. AJCC Pathologic Stage: pT3 pN3 pM- not applicable. See Cancer Case Summary.  SURGICAL PATHOLOGY CANCER CASE SUMMARY- LIP AND ORAL CAVITY Protocol posting date: June 2017 Procedure: Partial glossectomy Tumor site: Oral, tongue Tumor laterality: left Tumor focality: unifocal Tumor size: Greatest dimension 2.5 cm on gross examination Histologic type: Squamous cell carcinoma, conventional Histologic grade: G2-G3, moderate to poorly differentiated Specimen margins:  Uninvolved by invasive tumor Distance from closest margin: 6 mm (0.6 cm) from posterior margin Lymphovascular invasion: Not identified Perineural invasion: Not identified Regional lymph nodes: Number of lymph nodes involved: 3 Number of lymph nodes examined: 70 Laterality of lymph nodes involved: Bilateral Size of largest metastatic deposit: 9 mm (0.9 cm) Extranodal extension: very focally present in 1 lymph node (part J) Pathologic Stage Classification (pTNM, AJCC 8th Edition) Primary tumor (pT): pT3 Regional lymph nodes (pN): pN3 Distant metastasis (pM): not applicable   B.ANTERIOR DORSAL TONGUE, BIOPSY: Negative for malignancy.  C.POSTERIOR DORSAL TONGUE, BIOPSY: Negative for malignancy.  D.MID DORSAL TONGUE, BIOPSY: Negative for malignancy.  E.DEEP TONGUE, BIOPSY: Negative for malignancy.  F.ANTERIOR FLOOR OF MOUTH, BIOPSY: Negative for malignancy.  G.POSTERIOR FLOOR OF MOUTH, BIOPSY: Negative for malignancy.  H.LEVEL IA, RESECTION: Six benign lymph nodes negative for metastatic carcinoma on routine H&E staining (0/6).  I.LEVEL IB, RESECTION: Two benign lymph nodes negative for metastatic carcinoma on routine H&E staining (0/2). Benign salivary gland tissue.  J.LEFT LEVEL IIA , 3 AND 4, RESECTION: One of twenty-one lymph nodes positive for metastatic carcinoma (0.9 cm focus) with very focal extranodal extension on routine H&E staining (1/21).  K.LEVEL IIB, RESECTION: Four benign lymph nodes negative for metastatic carcinoma on routine H&E staining (0/4). One small focus of benign salivary gland tissue.  L.RIGHT LEVEL IB, RESECTION: Three benign lymph nodes negative for  metastatic carcinoma on routine H&E staining (0/3). Benign salivary gland tissue.  M.RIGHT LEVEL 2A, 3, 4, RESECTION: Two of fifteen lymph nodes positive for metastatic carcinoma (2/15). See comment.  Comment: A small focus suspicious for metastatic carcinoma is seen on the initial H&E stained slide. Therefore, a pankeratin immunohistochemical stain  is performed. The positive control stain functioned appropriately. The pankeratin immunostain highlights a few minute foci of metastatic carcinoma in 2 of the 3 lymph nodes (largest focus 0.2 mm [0.02 cm]).      01/28/2017 Surgery    He had surgery at [facility] <100 SQCM [15100] (SPLIT THICKNESS SKIN GRAFT LEG)           03/01/2017 Procedure    Successful fluoroscopic guided percutaneous gastrostomy tube placement.      03/01/2017 Procedure    Placement of a subcutaneous port device. Catheter tip at the superior cavoatrial junction and ready to be used.      03/12/2017 -  Radiation Therapy    He received radiation treatment       03/13/2017 Procedure    Baseline hearing is near normal      03/15/2017 -  Chemotherapy    He received cisplatin       INTERVAL HISTORY: Please see below for problem oriented charting. He returns for his weekly follow-up. He feels well. His pain is under good control.  He has lost some weight.  His feeding tube is not working. Evaluation by IR is pending Denies recent nausea or vomiting.  No constipation.  REVIEW OF SYSTEMS:   Constitutional: Denies fevers, chills or abnormal weight loss Eyes: Denies blurriness of vision Respiratory: Denies cough, dyspnea or wheezes Cardiovascular: Denies palpitation, chest discomfort or lower extremity swelling Gastrointestinal:  Denies nausea, heartburn or change in bowel habits Skin: Denies abnormal skin rashes Lymphatics: Denies new  lymphadenopathy or easy bruising Neurological:Denies numbness, tingling or new weaknesses Behavioral/Psych: Mood is stable, no new changes  All other systems were reviewed with the patient and are negative.  I have reviewed the past medical history, past surgical history, social history and family history with the patient and they are unchanged from previous note.  ALLERGIES:  is allergic to latex; adhesive [tape]; and codeine.  MEDICATIONS:  Current Outpatient Prescriptions  Medication Sig Dispense Refill  . aspirin 81 MG chewable tablet Chew 81 mg by mouth every morning.     Marland Kitchen dexamethasone (DECADRON) 4 MG tablet Taper as directed, take with food. 60 tablet 0  . fluconazole (DIFLUCAN) 100 MG tablet Take 1 tablet (100 mg total) by mouth daily. 7 tablet 0  . lidocaine-prilocaine (EMLA) cream Apply to affected area once 30 g 3  . magnesium oxide (MAG-OX) 400 (241.3 Mg) MG tablet Take 1 tablet (400 mg total) by mouth 2 (two) times daily. 60 tablet 9  . morphine (ROXANOL) 20 MG/ML concentrated solution Take 0.5 mLs (10 mg total) by mouth every 2 (two) hours as needed for severe pain. 120 mL 0  . fentaNYL (DURAGESIC - DOSED MCG/HR) 25 MCG/HR patch Place 1 patch (25 mcg total) onto the skin every 3 (three) days. (Patient not taking: Reported on 03/28/2017) 5 patch 0  . magic mouthwash w/lidocaine SOLN Take 5 mLs by mouth 4 (four) times daily as needed for mouth pain. (Patient not taking: Reported on 03/28/2017) 240 mL 0  . ondansetron (ZOFRAN) 8 MG tablet Take 1 tablet (8 mg total) by mouth every 8 (eight) hours as needed. Start on the third day after chemotherapy. (Patient not taking: Reported on 03/21/2017) 30 tablet 1  . prochlorperazine (COMPAZINE) 10 MG tablet Take 1 tablet (10 mg total) by mouth every 6 (six) hours as needed (Nausea or vomiting). (Patient not taking: Reported on 03/21/2017) 30  tablet 1   No current facility-administered medications for this visit.     PHYSICAL  EXAMINATION: ECOG PERFORMANCE STATUS: 1 - Symptomatic but completely ambulatory  Vitals:   04/11/17 1004  BP: 122/78  Pulse: 90  Resp: 18   Filed Weights   04/11/17 1004  Weight: 152 lb 6.4 oz (69.1 kg)    GENERAL:alert, no distress and comfortable SKIN: Noted dermatitis around his neck no ulceration  EYES: normal, Conjunctiva are pink and non-injected, sclera clear OROPHARYNX: Noted mucositis.  No thrush. NECK: supple, thyroid normal size, non-tender, without nodularity LYMPH:  no palpable lymphadenopathy in the cervical, axillary or inguinal LUNGS: clear to auscultation and percussion with normal breathing effort HEART: regular rate & rhythm and no murmurs and no lower extremity edema ABDOMEN:abdomen soft, non-tender and normal bowel sounds Musculoskeletal:no cyanosis of digits and no clubbing  NEURO: alert & oriented x 3 with fluent speech, no focal motor/sensory deficits  LABORATORY DATA:  I have reviewed the data as listed    Component Value Date/Time   NA 135 (L) 04/10/2017 0841   K 4.1 04/10/2017 0841   CL 99 02/04/2015 1419   CO2 29 04/10/2017 0841   GLUCOSE 94 04/10/2017 0841   BUN 34.1 (H) 04/10/2017 0841   CREATININE 1.1 04/10/2017 0841   CALCIUM 9.6 04/10/2017 0841   PROT 7.6 04/10/2017 0841   ALBUMIN 3.4 (L) 04/10/2017 0841   AST 29 04/10/2017 0841   ALT 38 04/10/2017 0841   ALKPHOS 74 04/10/2017 0841   BILITOT 0.66 04/10/2017 0841   GFRNONAA >90 07/24/2014 1220   GFRAA >90 07/24/2014 1220    No results found for: SPEP, UPEP  Lab Results  Component Value Date   WBC 5.2 04/10/2017   NEUTROABS 4.0 04/10/2017   HGB 11.3 (L) 04/10/2017   HCT 33.7 (L) 04/10/2017   MCV 95.9 04/10/2017   PLT 180 04/10/2017      Chemistry      Component Value Date/Time   NA 135 (L) 04/10/2017 0841   K 4.1 04/10/2017 0841   CL 99 02/04/2015 1419   CO2 29 04/10/2017 0841   BUN 34.1 (H) 04/10/2017 0841   CREATININE 1.1 04/10/2017 0841      Component Value  Date/Time   CALCIUM 9.6 04/10/2017 0841   ALKPHOS 74 04/10/2017 0841   AST 29 04/10/2017 0841   ALT 38 04/10/2017 0841   BILITOT 0.66 04/10/2017 0841       ASSESSMENT & PLAN:  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  We will consult interventional radiologist to assess this.  In the meantime, he is still able to swallow food by mouth.  I encouraged him to eat as much as possible  Hypomagnesemia He has low magnesium likely due to chemo I recommend magnesium replacement therapy  Mucositis due to antineoplastic therapy Mucositis is stable Candidiasis that resolved His pain is better control with addition of liquid morphine with fentanyl patch as needed Continue the same   No orders of the defined types were placed in this encounter.  All questions were answered. The patient knows to call the clinic with any problems, questions or  concerns. No barriers to learning was detected. I spent 15 minutes counseling the patient face to face. The total time spent in the appointment was 20 minutes and more than 50% was on counseling and review of test results     Heath Lark, MD 04/11/2017 10:23 AM

## 2017-04-11 NOTE — Assessment & Plan Note (Signed)
He has progressive weight loss since surgery and chemo We discussed the importance of increasing nutritional supplement as tolerated His feeding tube is not working well.  We will consult interventional radiologist to assess this.  In the meantime, he is still able to swallow food by mouth.  I encouraged him to eat as much as possible

## 2017-04-12 ENCOUNTER — Ambulatory Visit
Admission: RE | Admit: 2017-04-12 | Discharge: 2017-04-12 | Disposition: A | Discharge status: Home / Self Care | Payer: Medicare PPO | Source: Ambulatory Visit | Attending: Radiation Oncology | Admitting: Radiation Oncology

## 2017-04-12 ENCOUNTER — Ambulatory Visit (HOSPITAL_BASED_OUTPATIENT_CLINIC_OR_DEPARTMENT_OTHER): Payer: No Typology Code available for payment source

## 2017-04-12 ENCOUNTER — Ambulatory Visit: Payer: Medicare PPO | Admitting: Nutrition

## 2017-04-12 VITALS — BP 138/94 | HR 74 | Temp 98.0°F | Resp 18

## 2017-04-12 DIAGNOSIS — C021 Malignant neoplasm of border of tongue: Secondary | ICD-10-CM

## 2017-04-12 DIAGNOSIS — Z5111 Encounter for antineoplastic chemotherapy: Secondary | ICD-10-CM | POA: Diagnosis not present

## 2017-04-12 DIAGNOSIS — Z510 Encounter for antineoplastic radiation therapy: Secondary | ICD-10-CM | POA: Diagnosis not present

## 2017-04-12 DIAGNOSIS — Z95828 Presence of other vascular implants and grafts: Secondary | ICD-10-CM

## 2017-04-12 MED ORDER — HYDROMORPHONE HCL 4 MG/ML IJ SOLN
INTRAMUSCULAR | Status: AC
  Filled 2017-04-12: qty 1

## 2017-04-12 MED ORDER — HYDROMORPHONE HCL 4 MG/ML IJ SOLN
1.0000 mg | INTRAMUSCULAR | Status: DC | PRN
  Administered 2017-04-12: 1 mg via INTRAVENOUS

## 2017-04-12 MED ORDER — HYDROMORPHONE HCL 4 MG/ML IJ SOLN
2.0000 mg | Freq: Once | INTRAMUSCULAR | Status: AC
  Administered 2017-04-12: 2 mg via INTRAVENOUS

## 2017-04-12 MED ORDER — HEPARIN SOD (PORK) LOCK FLUSH 100 UNIT/ML IV SOLN
500.0000 [IU] | Freq: Once | INTRAVENOUS | Status: AC | PRN
  Administered 2017-04-12: 500 [IU]
  Filled 2017-04-12: qty 5

## 2017-04-12 MED ORDER — SODIUM CHLORIDE 0.9 % IV SOLN
Freq: Once | INTRAVENOUS | Status: AC
  Administered 2017-04-12: 11:00:00 via INTRAVENOUS

## 2017-04-12 MED ORDER — PALONOSETRON HCL INJECTION 0.25 MG/5ML
0.2500 mg | Freq: Once | INTRAVENOUS | Status: AC
  Administered 2017-04-12: 0.25 mg via INTRAVENOUS

## 2017-04-12 MED ORDER — PALONOSETRON HCL INJECTION 0.25 MG/5ML
INTRAVENOUS | Status: AC
  Filled 2017-04-12: qty 5

## 2017-04-12 MED ORDER — SODIUM CHLORIDE 0.9 % IV SOLN
Freq: Once | INTRAVENOUS | Status: AC
  Administered 2017-04-12: 13:00:00 via INTRAVENOUS
  Filled 2017-04-12: qty 5

## 2017-04-12 MED ORDER — SODIUM CHLORIDE 0.9% FLUSH
10.0000 mL | INTRAVENOUS | Status: DC | PRN
  Administered 2017-04-12: 10 mL
  Filled 2017-04-12: qty 10

## 2017-04-12 MED ORDER — POTASSIUM CHLORIDE 2 MEQ/ML IV SOLN
Freq: Once | INTRAVENOUS | Status: AC
  Administered 2017-04-12: 11:00:00 via INTRAVENOUS
  Filled 2017-04-12: qty 10

## 2017-04-12 MED ORDER — SODIUM CHLORIDE 0.9 % IV SOLN
40.0000 mg/m2 | Freq: Once | INTRAVENOUS | Status: AC
  Administered 2017-04-12: 76 mg via INTRAVENOUS
  Filled 2017-04-12: qty 76

## 2017-04-12 NOTE — Progress Notes (Signed)
Nutrition follow up completed with patient. Reports his feeding tube was "fixed" yesterday. Weight documented as 152.4 pounds May 10, decreased from 156.1 pounds on May 3. Denies problems with TF administration.  Nutrition diagnosis: Unintended weight loss continues.  Intervention: Educated patient to continue TF at goal rate to minimize weight loss. Provided support and encouragement. Teach back method used.  Monitoring, Evaluation, Goals: Patient will tolerate 6 bottles Nutren 1.5 to minimize weight loss.  Next Visit: Friday, May 18th in infusion.  **Disclaimer: This note was dictated with voice recognition software. Similar sounding words can inadvertently be transcribed and this note may contain transcription errors which may not have been corrected upon publication of note.**

## 2017-04-12 NOTE — Patient Instructions (Signed)
[facility] Discharge Instructions for Patients Receiving Chemotherapy  Today you received the following chemotherapy agents: Cisplatin   To help prevent nausea and vomiting after your treatment, we encourage you to take your nausea medication as directed.    If you develop nausea and vomiting that is not controlled by your nausea medication, call the clinic.   BELOW ARE SYMPTOMS THAT SHOULD BE REPORTED IMMEDIATELY:  *FEVER GREATER THAN 100.5 F  *CHILLS WITH OR WITHOUT FEVER  NAUSEA AND VOMITING THAT IS NOT CONTROLLED WITH YOUR NAUSEA MEDICATION  *UNUSUAL SHORTNESS OF BREATH  *UNUSUAL BRUISING OR BLEEDING  TENDERNESS IN MOUTH AND THROAT WITH OR WITHOUT PRESENCE OF ULCERS  *URINARY PROBLEMS  *BOWEL PROBLEMS  UNUSUAL RASH Items with * indicate a potential emergency and should be followed up as soon as possible.  Feel free to call the clinic you have any questions or concerns. The clinic phone number is [phone].  Please show the CHEMO ALERT CARD at check-in to the Emergency Department and triage nurse.   

## 2017-04-13 ENCOUNTER — Ambulatory Visit: Payer: Medicare PPO

## 2017-04-15 ENCOUNTER — Ambulatory Visit
Admission: RE | Admit: 2017-04-15 | Discharge: 2017-04-15 | Disposition: A | Discharge status: Home / Self Care | Payer: Medicare PPO | Source: Ambulatory Visit | Attending: Radiation Oncology | Admitting: Radiation Oncology

## 2017-04-15 DIAGNOSIS — Z510 Encounter for antineoplastic radiation therapy: Secondary | ICD-10-CM | POA: Diagnosis not present

## 2017-04-16 ENCOUNTER — Encounter: Payer: No Typology Code available for payment source | Admitting: Physical Therapy

## 2017-04-16 ENCOUNTER — Ambulatory Visit
Admission: RE | Admit: 2017-04-16 | Discharge: 2017-04-16 | Disposition: A | Discharge status: Home / Self Care | Payer: Medicare PPO | Source: Ambulatory Visit | Attending: Radiation Oncology | Admitting: Radiation Oncology

## 2017-04-17 ENCOUNTER — Ambulatory Visit: Admission: RE | Admit: 2017-04-17 | Payer: Medicare PPO | Source: Ambulatory Visit

## 2017-04-17 ENCOUNTER — Encounter: Payer: No Typology Code available for payment source | Admitting: Hematology and Oncology

## 2017-04-17 ENCOUNTER — Inpatient Hospital Stay (HOSPITAL_COMMUNITY)
Admission: AD | Admit: 2017-04-17 | Discharge: 2017-04-25 | DRG: 158 | Disposition: A | Discharge status: Home / Self Care | Payer: Medicare PPO | Source: Ambulatory Visit | Attending: Hematology and Oncology | Admitting: Hematology and Oncology

## 2017-04-17 ENCOUNTER — Other Ambulatory Visit: Payer: No Typology Code available for payment source

## 2017-04-17 ENCOUNTER — Encounter (HOSPITAL_COMMUNITY): Payer: Self-pay

## 2017-04-17 DIAGNOSIS — E871 Hypo-osmolality and hyponatremia: Secondary | ICD-10-CM | POA: Diagnosis present

## 2017-04-17 DIAGNOSIS — K5900 Constipation, unspecified: Secondary | ICD-10-CM | POA: Diagnosis present

## 2017-04-17 DIAGNOSIS — Z7901 Long term (current) use of anticoagulants: Secondary | ICD-10-CM

## 2017-04-17 DIAGNOSIS — R109 Unspecified abdominal pain: Secondary | ICD-10-CM

## 2017-04-17 DIAGNOSIS — Z87891 Personal history of nicotine dependence: Secondary | ICD-10-CM

## 2017-04-17 DIAGNOSIS — D61818 Other pancytopenia: Secondary | ICD-10-CM | POA: Diagnosis present

## 2017-04-17 DIAGNOSIS — R634 Abnormal weight loss: Secondary | ICD-10-CM | POA: Diagnosis present

## 2017-04-17 DIAGNOSIS — L7682 Other postprocedural complications of skin and subcutaneous tissue: Secondary | ICD-10-CM | POA: Diagnosis present

## 2017-04-17 DIAGNOSIS — Z9049 Acquired absence of other specified parts of digestive tract: Secondary | ICD-10-CM

## 2017-04-17 DIAGNOSIS — D61810 Antineoplastic chemotherapy induced pancytopenia: Secondary | ICD-10-CM

## 2017-04-17 DIAGNOSIS — R197 Diarrhea, unspecified: Secondary | ICD-10-CM | POA: Diagnosis present

## 2017-04-17 DIAGNOSIS — I2510 Atherosclerotic heart disease of native coronary artery without angina pectoris: Secondary | ICD-10-CM | POA: Diagnosis present

## 2017-04-17 DIAGNOSIS — T451X5A Adverse effect of antineoplastic and immunosuppressive drugs, initial encounter: Secondary | ICD-10-CM | POA: Diagnosis present

## 2017-04-17 DIAGNOSIS — Z885 Allergy status to narcotic agent status: Secondary | ICD-10-CM

## 2017-04-17 DIAGNOSIS — E876 Hypokalemia: Secondary | ICD-10-CM

## 2017-04-17 DIAGNOSIS — Z6824 Body mass index (BMI) 24.0-24.9, adult: Secondary | ICD-10-CM

## 2017-04-17 DIAGNOSIS — K1231 Oral mucositis (ulcerative) due to antineoplastic therapy: Principal | ICD-10-CM | POA: Diagnosis present

## 2017-04-17 DIAGNOSIS — Z931 Gastrostomy status: Secondary | ICD-10-CM

## 2017-04-17 DIAGNOSIS — C021 Malignant neoplasm of border of tongue: Secondary | ICD-10-CM | POA: Diagnosis not present

## 2017-04-17 DIAGNOSIS — I700 Atherosclerosis of aorta: Secondary | ICD-10-CM | POA: Diagnosis present

## 2017-04-17 DIAGNOSIS — D701 Agranulocytosis secondary to cancer chemotherapy: Secondary | ICD-10-CM | POA: Diagnosis present

## 2017-04-17 DIAGNOSIS — Z91040 Latex allergy status: Secondary | ICD-10-CM

## 2017-04-17 DIAGNOSIS — T671XXA Heat syncope, initial encounter: Secondary | ICD-10-CM | POA: Diagnosis not present

## 2017-04-17 DIAGNOSIS — E46 Unspecified protein-calorie malnutrition: Secondary | ICD-10-CM | POA: Diagnosis present

## 2017-04-17 DIAGNOSIS — E860 Dehydration: Secondary | ICD-10-CM | POA: Diagnosis present

## 2017-04-17 DIAGNOSIS — I252 Old myocardial infarction: Secondary | ICD-10-CM

## 2017-04-17 DIAGNOSIS — T85598A Other mechanical complication of other gastrointestinal prosthetic devices, implants and grafts, initial encounter: Secondary | ICD-10-CM

## 2017-04-17 DIAGNOSIS — D638 Anemia in other chronic diseases classified elsewhere: Secondary | ICD-10-CM | POA: Diagnosis present

## 2017-04-17 DIAGNOSIS — Z79899 Other long term (current) drug therapy: Secondary | ICD-10-CM

## 2017-04-17 DIAGNOSIS — D702 Other drug-induced agranulocytosis: Secondary | ICD-10-CM

## 2017-04-17 DIAGNOSIS — D6481 Anemia due to antineoplastic chemotherapy: Secondary | ICD-10-CM | POA: Diagnosis present

## 2017-04-17 DIAGNOSIS — E785 Hyperlipidemia, unspecified: Secondary | ICD-10-CM | POA: Diagnosis present

## 2017-04-17 LAB — COMPREHENSIVE METABOLIC PANEL
ALT: 39 U/L (ref 17–63)
AST: 28 U/L (ref 15–41)
Albumin: 3.3 g/dL — ABNORMAL LOW (ref 3.5–5.0)
Alkaline Phosphatase: 63 U/L (ref 38–126)
Anion gap: 12 (ref 5–15)
BUN: 32 mg/dL — ABNORMAL HIGH (ref 6–20)
CHLORIDE: 93 mmol/L — AB (ref 101–111)
CO2: 29 mmol/L (ref 22–32)
Calcium: 7.9 mg/dL — ABNORMAL LOW (ref 8.9–10.3)
Creatinine, Ser: 1.07 mg/dL (ref 0.61–1.24)
Glucose, Bld: 102 mg/dL — ABNORMAL HIGH (ref 65–99)
POTASSIUM: 3.7 mmol/L (ref 3.5–5.1)
SODIUM: 134 mmol/L — AB (ref 135–145)
Total Bilirubin: 0.5 mg/dL (ref 0.3–1.2)
Total Protein: 6.9 g/dL (ref 6.5–8.1)

## 2017-04-17 LAB — CBC
HCT: 29 % — ABNORMAL LOW (ref 39.0–52.0)
Hemoglobin: 9.6 g/dL — ABNORMAL LOW (ref 13.0–17.0)
MCH: 31.4 pg (ref 26.0–34.0)
MCHC: 33.1 g/dL (ref 30.0–36.0)
MCV: 94.8 fL (ref 78.0–100.0)
PLATELETS: 173 10*3/uL (ref 150–400)
RBC: 3.06 MIL/uL — AB (ref 4.22–5.81)
RDW: 13.7 % (ref 11.5–15.5)
WBC: 3.1 10*3/uL — AB (ref 4.0–10.5)

## 2017-04-17 LAB — GLUCOSE, CAPILLARY
GLUCOSE-CAPILLARY: 155 mg/dL — AB (ref 65–99)
Glucose-Capillary: 90 mg/dL (ref 65–99)

## 2017-04-17 LAB — PHOSPHORUS: PHOSPHORUS: 4.5 mg/dL (ref 2.5–4.6)

## 2017-04-17 LAB — MAGNESIUM: MAGNESIUM: 1.1 mg/dL — AB (ref 1.7–2.4)

## 2017-04-17 MED ORDER — ONDANSETRON 4 MG PO TBDP: 4.0000 mg | ORAL_TABLET | Freq: Three times a day (TID) | ORAL | Status: DC | PRN

## 2017-04-17 MED ORDER — SODIUM CHLORIDE 0.9 % IV SOLN
INTRAVENOUS | Status: DC
  Administered 2017-04-17 – 2017-04-18 (×2): via INTRAVENOUS

## 2017-04-17 MED ORDER — MORPHINE SULFATE (CONCENTRATE) 10 MG/0.5ML PO SOLN
10.0000 mg | ORAL | Status: DC | PRN
  Administered 2017-04-19: 10 mg via ORAL
  Filled 2017-04-17 (×3): qty 0.5

## 2017-04-17 MED ORDER — MAGNESIUM SULFATE 4 GM/100ML IV SOLN
4.0000 g | Freq: Once | INTRAVENOUS | Status: AC
Start: 2017-04-17 — End: 2017-04-17
  Administered 2017-04-17: 4 g via INTRAVENOUS
  Filled 2017-04-17 (×2): qty 100

## 2017-04-17 MED ORDER — ONDANSETRON HCL 4 MG PO TABS: 4.0000 mg | ORAL_TABLET | Freq: Three times a day (TID) | ORAL | Status: DC | PRN

## 2017-04-17 MED ORDER — LIDOCAINE-PRILOCAINE 2.5-2.5 % EX CREA: TOPICAL_CREAM | Freq: Every day | CUTANEOUS | Status: DC | PRN

## 2017-04-17 MED ORDER — ADULT MULTIVITAMIN LIQUID CH
15.0000 mL | Freq: Every day | ORAL | Status: DC
  Administered 2017-04-18 – 2017-04-25 (×8): 15 mL via ORAL
  Filled 2017-04-17 (×9): qty 15

## 2017-04-17 MED ORDER — ACETAMINOPHEN 325 MG PO TABS: 650.0000 mg | ORAL_TABLET | ORAL | Status: DC | PRN

## 2017-04-17 MED ORDER — HYDROMORPHONE HCL 1 MG/ML IJ SOLN
1.0000 mg | INTRAMUSCULAR | Status: DC | PRN
  Administered 2017-04-17 – 2017-04-18 (×7): 1 mg via INTRAVENOUS
  Filled 2017-04-17 (×8): qty 1

## 2017-04-17 MED ORDER — OSMOLITE 1.5 CAL PO LIQD
237.0000 mL | Freq: Four times a day (QID) | ORAL | Status: AC
  Administered 2017-04-17 – 2017-04-18 (×6): 237 mL
  Filled 2017-04-17 (×6): qty 237

## 2017-04-17 MED ORDER — MAGIC MOUTHWASH W/LIDOCAINE
5.0000 mL | Freq: Four times a day (QID) | ORAL | Status: DC | PRN
  Filled 2017-04-17: qty 5

## 2017-04-17 MED ORDER — ONDANSETRON HCL 4 MG/2ML IJ SOLN: 4.0000 mg | Freq: Three times a day (TID) | INTRAMUSCULAR | Status: DC | PRN

## 2017-04-17 MED ORDER — PRO-STAT SUGAR FREE PO LIQD
30.0000 mL | Freq: Two times a day (BID) | ORAL | Status: DC
  Administered 2017-04-17 – 2017-04-18 (×2): 30 mL via ORAL
  Filled 2017-04-17 (×2): qty 30

## 2017-04-17 MED ORDER — JEVITY 1.2 CAL PO LIQD: 1000.0000 mL | ORAL | Status: DC

## 2017-04-17 MED ORDER — ENOXAPARIN SODIUM 40 MG/0.4ML ~~LOC~~ SOLN
40.0000 mg | SUBCUTANEOUS | Status: DC
  Administered 2017-04-17 – 2017-04-18 (×2): 40 mg via SUBCUTANEOUS
  Filled 2017-04-17 (×6): qty 0.4

## 2017-04-17 MED ORDER — FENTANYL 25 MCG/HR TD PT72
25.0000 ug | MEDICATED_PATCH | TRANSDERMAL | Status: DC
  Administered 2017-04-17: 25 ug via TRANSDERMAL
  Filled 2017-04-17: qty 1

## 2017-04-17 MED ORDER — SENNOSIDES-DOCUSATE SODIUM 8.6-50 MG PO TABS: 1.0000 | ORAL_TABLET | Freq: Every evening | ORAL | Status: DC | PRN

## 2017-04-17 MED ORDER — SODIUM CHLORIDE 0.9 % IV SOLN
INTRAVENOUS | Status: DC
  Administered 2017-04-17 – 2017-04-23 (×6): via INTRAVENOUS

## 2017-04-17 MED ORDER — SODIUM CHLORIDE 0.9 % IV SOLN
8.0000 mg | Freq: Three times a day (TID) | INTRAVENOUS | Status: DC | PRN
  Filled 2017-04-17: qty 4
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
## 2017-04-17 NOTE — Progress Notes (Signed)
This encounter was created in error - please disregard.

## 2017-04-17 NOTE — Progress Notes (Signed)
Initial Nutrition Assessment  DOCUMENTATION CODES:   Severe malnutrition in context of acute illness/injury  INTERVENTION:   Continue home TF regimen: Provide 1 can (237 ml) Osmolite 1.5 QID w/ 30 ml Prostat BID. Free water flush with 60 ml before and after each bolus feed. This will provide 1620 kcal, 90g protein and 1204 ml H2O.  Goal will be to advance to 1.5 cans (355 ml) of Osmolite 1.5 QID w/ 30 ml Prostat once daily. Free water flushes with 60 ml before and after each bolus feed. Pt will need additional free water of 240 ml (8oz) TID either via PEG or PO.   Provide liquid MVI daily RD to continue to monitor  NUTRITION DIAGNOSIS:   Malnutrition (Severe) related to cancer and cancer related treatments, acute illness, dysphagia as evidenced by percent weight loss, moderate depletion of body fat, moderate depletions of muscle mass.  GOAL:   Patient will meet greater than or equal to 90% of their needs  MONITOR:   PO intake, Labs, Weight trends, TF tolerance, I & O's  REASON FOR ASSESSMENT:   Consult Enteral/tube feeding initiation and management  ASSESSMENT:   66 y.o. male patient with history of tongue cancer receiving radiation and chemotherapy.  Patient in room with no family at bedside. Pt is followed by [place] RD. Per patient, he administers 4 cans of Nutren 1.5 (provided from [place]) daily. States he has not had any issues with tolerating bolus feeds or volumes from feeds. Main complaint is diarrhea. Pt is on a full liquid diet but states he does not drink that much PO d/t inability to swallow and now he has mucositis. Prior to these symptoms becoming worse, he was trying to drink Ovaltine milkshakes.   Per [place] notes, pt's TF goal rate is 6 cans of Nutren 1.5 daily with 60 ml of free water before and after each bolus feed with additional fluid needs of 675 ml (providing 2250 kcal, 102 g protein and 2301 ml H2O). Our formulary does not have Nutren  products, so will substitute with comparable formula, Osmolite 1.5.   Per chart review, pt has lost 14 lb since 4/26 (9% wt loss x 4 weeks, significant for time frame). Nutrition-Focused physical exam completed. Findings are moderate fat depletion, moderate muscle depletion, and no edema.   Medications: IV Mg sulfate once  Labs reviewed:  Low Na, Mg Phos WNL  Diet Order:  Diet full liquid Room service appropriate? Yes; Fluid consistency: Thin  Skin:  Reviewed, no issues  Last BM:  PTA  Height:   Ht Readings from Last 1 Encounters:  04/17/17 5\' 8"  (1.727 m)    Weight:   Wt Readings from Last 1 Encounters:  04/17/17 146 lb 1.6 oz (66.3 kg)    Ideal Body Weight:  70 kg  BMI:  There is no height or weight on file to calculate BMI.  Estimated Nutritional Needs:   Kcal:  2130-2330  Protein:  95-105g  Fluid:  2.3L/day  EDUCATION NEEDS:   Education needs addressed  Clayton Bibles, MS, RD, LDN Pager: [phone] After Hours Pager: [phone]

## 2017-04-18 ENCOUNTER — Ambulatory Visit: Payer: No Typology Code available for payment source | Admitting: Hematology and Oncology

## 2017-04-18 ENCOUNTER — Encounter: Payer: Self-pay | Admitting: *Deleted

## 2017-04-18 ENCOUNTER — Ambulatory Visit
Admission: RE | Admit: 2017-04-18 | Discharge: 2017-04-18 | Disposition: A | Discharge status: Home / Self Care | Payer: Medicare PPO | Source: Ambulatory Visit | Attending: Radiation Oncology | Admitting: Radiation Oncology

## 2017-04-18 DIAGNOSIS — C021 Malignant neoplasm of border of tongue: Secondary | ICD-10-CM | POA: Diagnosis not present

## 2017-04-18 DIAGNOSIS — E876 Hypokalemia: Secondary | ICD-10-CM

## 2017-04-18 DIAGNOSIS — K1231 Oral mucositis (ulcerative) due to antineoplastic therapy: Secondary | ICD-10-CM | POA: Diagnosis not present

## 2017-04-18 DIAGNOSIS — D61810 Antineoplastic chemotherapy induced pancytopenia: Secondary | ICD-10-CM | POA: Diagnosis not present

## 2017-04-18 LAB — GASTROINTESTINAL PANEL BY PCR, STOOL (REPLACES STOOL CULTURE)

## 2017-04-18 LAB — COMPREHENSIVE METABOLIC PANEL
ALBUMIN: 2.9 g/dL — AB (ref 3.5–5.0)
ALK PHOS: 56 U/L (ref 38–126)
ALT: 33 U/L (ref 17–63)
AST: 23 U/L (ref 15–41)
Anion gap: 9 (ref 5–15)
BILIRUBIN TOTAL: 0.3 mg/dL (ref 0.3–1.2)
BUN: 25 mg/dL — ABNORMAL HIGH (ref 6–20)
CALCIUM: 7.6 mg/dL — AB (ref 8.9–10.3)
CO2: 28 mmol/L (ref 22–32)
Chloride: 97 mmol/L — ABNORMAL LOW (ref 101–111)
Creatinine, Ser: 0.79 mg/dL (ref 0.61–1.24)
GLUCOSE: 86 mg/dL (ref 65–99)
POTASSIUM: 3.4 mmol/L — AB (ref 3.5–5.1)
Sodium: 134 mmol/L — ABNORMAL LOW (ref 135–145)
TOTAL PROTEIN: 6.1 g/dL — AB (ref 6.5–8.1)

## 2017-04-18 LAB — GLUCOSE, CAPILLARY
GLUCOSE-CAPILLARY: 82 mg/dL (ref 65–99)
GLUCOSE-CAPILLARY: 133 mg/dL — AB (ref 65–99)
GLUCOSE-CAPILLARY: 144 mg/dL — AB (ref 65–99)
Glucose-Capillary: 102 mg/dL — ABNORMAL HIGH (ref 65–99)
Glucose-Capillary: 125 mg/dL — ABNORMAL HIGH (ref 65–99)
Glucose-Capillary: 86 mg/dL (ref 65–99)

## 2017-04-18 LAB — PHOSPHORUS: PHOSPHORUS: 4.2 mg/dL (ref 2.5–4.6)

## 2017-04-18 LAB — MAGNESIUM: MAGNESIUM: 1.7 mg/dL (ref 1.7–2.4)

## 2017-04-18 MED ORDER — HYDROMORPHONE HCL 1 MG/ML IJ SOLN
1.0000 mg | INTRAMUSCULAR | Status: DC | PRN
  Administered 2017-04-18: 1 mg via INTRAVENOUS

## 2017-04-18 MED ORDER — HYDROMORPHONE HCL 4 MG/ML IJ SOLN
1.0000 mg | INTRAMUSCULAR | Status: DC | PRN
  Administered 2017-04-18 – 2017-04-19 (×7): 1 mg via INTRAVENOUS
  Filled 2017-04-18 (×8): qty 1

## 2017-04-18 MED ORDER — PRO-STAT SUGAR FREE PO LIQD
30.0000 mL | Freq: Every day | ORAL | Status: DC
  Administered 2017-04-18 – 2017-04-25 (×8): 30 mL via ORAL
  Filled 2017-04-18 (×8): qty 30

## 2017-04-18 MED ORDER — OSMOLITE 1.5 CAL PO LIQD
355.0000 mL | Freq: Four times a day (QID) | ORAL | Status: DC
  Administered 2017-04-19: 355 mL
  Administered 2017-04-20: 237 mL
  Administered 2017-04-20 – 2017-04-21 (×4): 355 mL
  Administered 2017-04-21: 274 mL
  Administered 2017-04-22 – 2017-04-25 (×9): 355 mL
  Filled 2017-04-18 (×27): qty 474

## 2017-04-18 MED ORDER — POTASSIUM CHLORIDE 20 MEQ/15ML (10%) PO SOLN
20.0000 meq | Freq: Two times a day (BID) | ORAL | Status: AC
  Administered 2017-04-18 (×2): 20 meq
  Filled 2017-04-18 (×2): qty 15

## 2017-04-18 NOTE — Progress Notes (Signed)
Pt noticed that his rt ear was bleeding  from the back of his ear. The area is dried skin from rad tx

## 2017-04-18 NOTE — Progress Notes (Signed)
[facility] Therapy Treatment Record Phone [phone]   Radiation Therapy was administered to [name] on: 04/18/2017  2:11 PM and was treatment # 26 out of a planned course of 30 treatments.  Radiation Treatment  1). Beam photons with 6-10 energy and Photons 6-10 MeV  2). Brachytherapy None  3). Stereotactic Radiosurgery None  4). Other Radiation None     Nicolaos Mitrano, RT (T)

## 2017-04-18 NOTE — Progress Notes (Signed)
Nutrition Brief Note  Patient in room with wife at bedside. Pt currently tolerating bolus feeds today. Has received 2 cans of Osmolite 1.5 so far with no reported issues. Will continue to order for 4 cans of Osmolite 1.5 today. Pt is agreeable to advancing to goal rate starting tomorrow.  5/18: Plan to advance Osmolite 1.5, 1.5 cans QID w/ 30 ml Prostat once daily. Free water flushes with 60 ml before and after each bolus feed. Pt will need additional free water of 240 ml (8oz) TID either via PEG or PO.   Will continue to monitor.  Clayton Bibles, MS, RD, LDN Pager: [phone] After Hours Pager: [phone]

## 2017-04-18 NOTE — Progress Notes (Signed)
Oncology Nurse Navigator Documentation  Met with Mr. [name] [phone] to check on his well-being. He reported extreme mouth pain, managed with IV dilaudid PRN. I discussed importance of having RT today, noted he has only 5 tmts remaining, encouraged him to keep tmt rescheduled for later today. He agreed to tmt, [employer] (at bedside) agreed to provide PRN meds as needed. I informed Tomo of patient's agreement to be treated.  Gayleen Orem, RN, BSN, [place] [facility]ck Oncology Nurse [place] at [place] [phone]

## 2017-04-18 NOTE — Progress Notes (Signed)
[name]   DOB:12-Jan-1951   YK#:[id number]    Subjective: The patient feels better.  He feels a bit stronger with aggressive IV fluid resuscitation.  He felt that he has minimum urine output only. His diarrhea has reduced to 3 times a day. He was able to tolerate some tube feeds yesterday. The pain in his mouth is manageable. However, he continues to feel weak.  Objective:  Vitals:   04/17/17 2030 04/18/17 0425  BP: 121/70 133/86  Pulse: 73 77  Resp: 16 16  Temp: 97.9 F (36.6 C) 98.5 F (36.9 C)     Intake/Output Summary (Last 24 hours) at 04/18/17 0805 Last data filed at 04/18/17 0449  Gross per 24 hour  Intake          2441.67 ml  Output              400 ml  Net          2041.67 ml    GENERAL:alert, no distress and comfortable SKIN: Significant radiation induced skin changes around his neck with mild ulceration  EYES: normal, Conjunctiva are pink and non-injected, sclera clear OROPHARYNX: Significant mucositis with lip bleeding.  There is cross.  Ulcerated changes but no thrush NECK: supple, thyroid normal size, non-tender, without nodularity LYMPH:  no palpable lymphadenopathy in the cervical, axillary or inguinal LUNGS: clear to auscultation and percussion with normal breathing effort HEART: regular rate & rhythm and no murmurs and no lower extremity edema ABDOMEN:abdomen soft, non-tender and normal bowel sounds.  Feeding tube site looks okay Musculoskeletal:no cyanosis of digits and no clubbing  NEURO: alert & oriented x 3 with fluent speech, no focal motor/sensory deficits   Labs:  Lab Results  Component Value Date   WBC 3.1 (L) 04/17/2017   HGB 9.6 (L) 04/17/2017   HCT 29.0 (L) 04/17/2017   MCV 94.8 04/17/2017   PLT 173 04/17/2017   NEUTROABS 4.0 04/10/2017    Lab Results  Component Value Date   NA 134 (L) 04/18/2017   K 3.4 (L) 04/18/2017   CL 97 (L) 04/18/2017   CO2 28 04/18/2017   Assessment & Plan:  Squamous cell carcinoma of lateral tongue  ([place]) He has started to experience worsening side-effects I will hold his chemotherapy. Radiation department is informed of his admission and I would defer to them for evaluation whether it is appropriate to hold radiation treatment  Weight loss He has progressive weight loss since surgery and chemo, exacerbated by fluid loss due to severe diarrhea We will consult dietitian while hospitalized for tube feeds He may be at risk of refeeding syndrome.  We will monitor electrolytes carefully  Severe diarrhea, slowly improving Cause is unknown Will order stool culture, pending Hold off Imodium until stool culture is done We will give him aggressive fluid resuscitation for now  Hypomagnesemia, resolved He has low magnesium likely due to chemo and loss from diarrhea I recommend magnesium replacement therapy intravenously as needed  Hypokalemia Replace through the tube We will continue to check  Near syncopal episode Likely due to profound dehydration We will give him aggressive IV fluid resuscitation  Mucositis due to antineoplastic therapy Mucositis is stable Candidiasis that resolved His pain is better control with addition of liquid morphine with fentanyl patch as needed Continue the same  Acquired pancytopenia Due to recent treatment Monitor closely  DVT prophylaxis On Lovenox  CODE STATUS Full code  Discharge planning He is overall improved but remain weak.  He is not  ready to go home Likely will be here at least for 48 hours We will continue to assess on a daily basis  Heath Lark, MD 04/18/2017  8:05 AM

## 2017-04-19 ENCOUNTER — Inpatient Hospital Stay (HOSPITAL_COMMUNITY): Payer: Medicare PPO

## 2017-04-19 ENCOUNTER — Encounter: Payer: Self-pay | Admitting: *Deleted

## 2017-04-19 ENCOUNTER — Ambulatory Visit: Payer: No Typology Code available for payment source

## 2017-04-19 ENCOUNTER — Encounter: Payer: Medicare PPO | Admitting: Nutrition

## 2017-04-19 ENCOUNTER — Ambulatory Visit: Payer: Medicare PPO

## 2017-04-19 DIAGNOSIS — K5900 Constipation, unspecified: Secondary | ICD-10-CM | POA: Diagnosis present

## 2017-04-19 DIAGNOSIS — E785 Hyperlipidemia, unspecified: Secondary | ICD-10-CM | POA: Diagnosis present

## 2017-04-19 DIAGNOSIS — Z885 Allergy status to narcotic agent status: Secondary | ICD-10-CM | POA: Diagnosis not present

## 2017-04-19 DIAGNOSIS — D638 Anemia in other chronic diseases classified elsewhere: Secondary | ICD-10-CM | POA: Diagnosis present

## 2017-04-19 DIAGNOSIS — R55 Syncope and collapse: Secondary | ICD-10-CM | POA: Diagnosis not present

## 2017-04-19 DIAGNOSIS — D6481 Anemia due to antineoplastic chemotherapy: Secondary | ICD-10-CM | POA: Diagnosis present

## 2017-04-19 DIAGNOSIS — D701 Agranulocytosis secondary to cancer chemotherapy: Secondary | ICD-10-CM | POA: Diagnosis present

## 2017-04-19 DIAGNOSIS — Z6824 Body mass index (BMI) 24.0-24.9, adult: Secondary | ICD-10-CM | POA: Diagnosis not present

## 2017-04-19 DIAGNOSIS — T451X5A Adverse effect of antineoplastic and immunosuppressive drugs, initial encounter: Secondary | ICD-10-CM | POA: Diagnosis present

## 2017-04-19 DIAGNOSIS — R5383 Other fatigue: Secondary | ICD-10-CM

## 2017-04-19 DIAGNOSIS — D61810 Antineoplastic chemotherapy induced pancytopenia: Secondary | ICD-10-CM | POA: Diagnosis not present

## 2017-04-19 DIAGNOSIS — E860 Dehydration: Secondary | ICD-10-CM | POA: Diagnosis present

## 2017-04-19 DIAGNOSIS — I252 Old myocardial infarction: Secondary | ICD-10-CM | POA: Diagnosis not present

## 2017-04-19 DIAGNOSIS — K1231 Oral mucositis (ulcerative) due to antineoplastic therapy: Secondary | ICD-10-CM | POA: Diagnosis present

## 2017-04-19 DIAGNOSIS — D61818 Other pancytopenia: Secondary | ICD-10-CM | POA: Diagnosis present

## 2017-04-19 DIAGNOSIS — E871 Hypo-osmolality and hyponatremia: Secondary | ICD-10-CM | POA: Diagnosis present

## 2017-04-19 DIAGNOSIS — E876 Hypokalemia: Secondary | ICD-10-CM | POA: Diagnosis present

## 2017-04-19 DIAGNOSIS — I2510 Atherosclerotic heart disease of native coronary artery without angina pectoris: Secondary | ICD-10-CM | POA: Diagnosis present

## 2017-04-19 DIAGNOSIS — C021 Malignant neoplasm of border of tongue: Secondary | ICD-10-CM | POA: Diagnosis present

## 2017-04-19 DIAGNOSIS — Z931 Gastrostomy status: Secondary | ICD-10-CM | POA: Diagnosis not present

## 2017-04-19 DIAGNOSIS — D702 Other drug-induced agranulocytosis: Secondary | ICD-10-CM | POA: Diagnosis not present

## 2017-04-19 DIAGNOSIS — E46 Unspecified protein-calorie malnutrition: Secondary | ICD-10-CM | POA: Diagnosis present

## 2017-04-19 DIAGNOSIS — R197 Diarrhea, unspecified: Secondary | ICD-10-CM | POA: Diagnosis present

## 2017-04-19 DIAGNOSIS — Z7901 Long term (current) use of anticoagulants: Secondary | ICD-10-CM | POA: Diagnosis not present

## 2017-04-19 DIAGNOSIS — I700 Atherosclerosis of aorta: Secondary | ICD-10-CM | POA: Diagnosis present

## 2017-04-19 DIAGNOSIS — Z9049 Acquired absence of other specified parts of digestive tract: Secondary | ICD-10-CM | POA: Diagnosis not present

## 2017-04-19 DIAGNOSIS — R634 Abnormal weight loss: Secondary | ICD-10-CM | POA: Diagnosis not present

## 2017-04-19 LAB — GLUCOSE, CAPILLARY
GLUCOSE-CAPILLARY: 102 mg/dL — AB (ref 65–99)
GLUCOSE-CAPILLARY: 112 mg/dL — AB (ref 65–99)
GLUCOSE-CAPILLARY: 93 mg/dL (ref 65–99)
GLUCOSE-CAPILLARY: 96 mg/dL (ref 65–99)
Glucose-Capillary: 94 mg/dL (ref 65–99)
Glucose-Capillary: 127 mg/dL — ABNORMAL HIGH (ref 65–99)

## 2017-04-19 LAB — MAGNESIUM: Magnesium: 1 mg/dL — ABNORMAL LOW (ref 1.7–2.4)

## 2017-04-19 LAB — PHOSPHORUS: Phosphorus: 2.9 mg/dL (ref 2.5–4.6)

## 2017-04-19 LAB — POTASSIUM: Potassium: 3.6 mmol/L (ref 3.5–5.1)

## 2017-04-19 MED ORDER — HYDROMORPHONE HCL 1 MG/ML IJ SOLN
2.0000 mg | INTRAMUSCULAR | Status: DC | PRN
  Administered 2017-04-19 – 2017-04-20 (×5): 2 mg via INTRAVENOUS
  Filled 2017-04-19 (×5): qty 2

## 2017-04-19 MED ORDER — LIP MEDEX EX OINT
TOPICAL_OINTMENT | CUTANEOUS | Status: DC | PRN
  Administered 2017-04-20 (×2): via TOPICAL

## 2017-04-19 MED ORDER — MAGNESIUM SULFATE 4 GM/100ML IV SOLN
4.0000 g | Freq: Once | INTRAVENOUS | Status: AC
  Administered 2017-04-19: 4 g via INTRAVENOUS
  Filled 2017-04-19: qty 100

## 2017-04-19 MED ORDER — LIP MEDEX EX OINT
TOPICAL_OINTMENT | CUTANEOUS | Status: AC
  Filled 2017-04-19: qty 7

## 2017-04-19 MED ORDER — FENTANYL 25 MCG/HR TD PT72
37.0000 ug | MEDICATED_PATCH | TRANSDERMAL | Status: DC
  Administered 2017-04-20 – 2017-04-23 (×2): 37.5 ug via TRANSDERMAL
  Filled 2017-04-19 (×2): qty 1

## 2017-04-19 NOTE — Progress Notes (Signed)
Pt abdomen is taunt and  ternder .Decreased bowel sounds. I am holding tube feeding until CT scan is completed

## 2017-04-19 NOTE — Progress Notes (Signed)
Oncology Nurse Navigator Documentation  Met with Mr. [name] 1245 to check on his well-being, his reported refusal of today's RT. He stated lips, tongue and mouth extremely sore; PEG site painful.  "I just can't have treatment today." He has been receiving PRN dilaudid with some relief. He stated PEG insertion site is painful, new onset since yesterday.    Upon inspection, retention ring appeared exceptionally snug to abdomine. I attempted to adjust but site painful to light palpation.  RN Jocelyn Lamer noted in later conversation patient was unable to tolerate bolus feeds earlier today d/t pain.  I suggested referral to IR appropriate for further evaluation.  She suggested increased dilaudid dosage may be helpful in controlling pain, I offered agreement.   I provided mouth care to lips and tongue with swabs/ice water followed by lip moisturizer. I notified Tomo and Dr. Isidore Moos is refusing RT.  Gayleen Orem, RN, BSN, [place] [facility] Oncology Nurse [place] at [place] [phone]

## 2017-04-19 NOTE — Progress Notes (Signed)
Patient ID: [name], male   DOB: 11/08/1951, 66 y.o.   MRN: [id number] Request received to evaluate patient's G-tube secondary to pain at site. On exam G-tube insertion site is clean and dry without drainage or erythema. Patient does have pain at site with palpation. There is no significant induration in region. Limited CT of abdomen ordered which revealed  gastrostomy tube is in good position without any complicating features. There were no acute abdominal findings noted. Above discussed with Dr. Laurence Ferrari. No additional intervention needed at this time. If pain persists could consider lidocaine patch near site of G-tube insertion. Make sure outer disc is taut to the skin surface; may apply 1 layer of drain sponge under disc to prevent skin irritation.

## 2017-04-19 NOTE — Progress Notes (Signed)
Discharge plan:  Pt plans to DC back home. Pt states that he receives his bolus tube feeds from the [place]. CM will continue to follow for DC assistance. Marney Doctor RN,BSN,NCM [phone]

## 2017-04-19 NOTE — Care Management Obs Status (Signed)
[place] NOTIFICATION   Patient Details  Name: RUMALDO DIFATTA MRN: [id number] Date of Birth: September 01, 1951   Medicare Observation Status Notification Given:  Yes    Lynnell Catalan, RN 04/19/2017, 9:35 AM

## 2017-04-19 NOTE — Progress Notes (Signed)
[name]   DOB:1951/01/26   TO#:[id number]    Subjective: He is very miserable with severe pain.  The pain is from mucositis.  He would like the pain prescription to be increased.  He has intermittent bleeding from his mouth.  He denies further diarrhea.  His symptoms of dehydration and lightheadedness has improved with IV fluids.  He is able to drink some water.  The feeding tube is present on his abdominal wall and he does not feel well.  He is able to tolerate nutritional feeding through the feeding tube.  He does not want to go to radiation department today.  He is still very weak overall  Objective:  Vitals:   04/18/17 2035 04/19/17 0425  BP: 119/72 130/82  Pulse: 82 86  Resp: 16 16  Temp: 98.7 F (37.1 C) 99.2 F (37.3 C)     Intake/Output Summary (Last 24 hours) at 04/19/17 9833 Last data filed at 04/19/17 0630  Gross per 24 hour  Intake             1814 ml  Output              350 ml  Net             1464 ml    GENERAL:alert, no distress and comfortable SKIN: He has significant dermatitis around his neck EYES: normal, Conjunctiva are pink and non-injected, sclera clear OROPHARYNX: he has significant mucositis with dry crusts on his lips NECK: supple, thyroid normal size, non-tender, without nodularity LYMPH:  no palpable lymphadenopathy in the cervical, axillary or inguinal LUNGS: clear to auscultation and percussion with normal breathing effort HEART: regular rate & rhythm and no murmurs and no lower extremity edema ABDOMEN:abdomen soft, non-tender and normal bowel sounds.  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 Radiation department is informed of his admission and I would defer to them for evaluation whether it is appropriate to hold radiation treatment I will get the ENT navigator to talk to the patient and adjust his feeding tube position  Weight loss He has progressive weight loss since surgery and chemo, exacerbated by fluid loss due to severe diarrhea We will consult dietitian while hospitalized for tube feeds He may be at risk of refeeding syndrome.  We will monitor electrolytes carefully  Severe diarrhea, resolved Cause is unknown Stool culture negative We will give him aggressive fluid resuscitation for now  Hypomagnesemia, intermittent He has low magnesium likely due to chemo and loss from diarrhea I recommend magnesium replacement therapyintravenously as needed  Hypokalemia, intermittent Replace through the tube We will continue to check  Near syncopal episode Likely due to profound dehydration We will give him aggressive IV fluid resuscitation  Severe mucositis due to antineoplastic therapy Mucositis is worse  Candidiasis that resolved His pain is better control with addition of liquid morphine with fentanyl patch as needed Per patient request, will increase the dose of fentanyl patch.  Continue liquid morphine as needed for breakthrough pain  Acquired pancytopenia Due to recent treatment Monitor closely  DVT prophylaxis On Lovenox  CODE STATUS Full code  Discharge planning He is mildly improved but remain weak.  He is not ready to go home Likely will be here over the weekend He is dependent in the IV fluid resuscitation and had significant electrolyte abnormalities We will continue to  assess on a daily basis  Heath Lark, MD 04/19/2017  9:04 AM

## 2017-04-19 NOTE — Progress Notes (Signed)
Oncology Nurse Navigator Documentation  Visited Mr. Dudding with Dr. Alvy Bimler. She reported CT Abd WNL, noted she increased PRN dilaudid dosage. I cleaned PEG site with NS, applied single split gauze. I performed additional mouth care.  Gayleen Orem, RN, BSN, [place] [facility] Nurse [facility] [place] [phone]

## 2017-04-19 NOTE — Progress Notes (Signed)
Since early this morning pt has c/o of abdominal pain and cramping. He stated that he started after his RAD tx yesterday when he had pressure applied to his abdomen.I have held the 1000 and 1400 tube feedings until after the CT SCAN .

## 2017-04-19 NOTE — Progress Notes (Signed)
[name] could not tolerate the 355 ml of Osmolye.He tolerated about 352ml. He has decline the 1800 feeding now. He is sleeping from his pain med. Feeding may be given later this evening. I will pass this on to the oncoming 7 p-7a RN

## 2017-04-20 DIAGNOSIS — E876 Hypokalemia: Secondary | ICD-10-CM

## 2017-04-20 DIAGNOSIS — D702 Other drug-induced agranulocytosis: Secondary | ICD-10-CM

## 2017-04-20 LAB — GLUCOSE, CAPILLARY
GLUCOSE-CAPILLARY: 102 mg/dL — AB (ref 65–99)
GLUCOSE-CAPILLARY: 111 mg/dL — AB (ref 65–99)
GLUCOSE-CAPILLARY: 123 mg/dL — AB (ref 65–99)
GLUCOSE-CAPILLARY: 91 mg/dL (ref 65–99)
GLUCOSE-CAPILLARY: 99 mg/dL (ref 65–99)
Glucose-Capillary: 89 mg/dL (ref 65–99)
Glucose-Capillary: 132 mg/dL — ABNORMAL HIGH (ref 65–99)

## 2017-04-20 LAB — CBC WITH DIFFERENTIAL/PLATELET
BASOS PCT: 0 %
Basophils Absolute: 0 10*3/uL (ref 0.0–0.1)
EOS ABS: 0.1 10*3/uL (ref 0.0–0.7)
Eosinophils Relative: 6 %
HCT: 23.1 % — ABNORMAL LOW (ref 39.0–52.0)
HEMOGLOBIN: 7.9 g/dL — AB (ref 13.0–17.0)
Lymphocytes Relative: 15 %
Lymphs Abs: 0.3 10*3/uL — ABNORMAL LOW (ref 0.7–4.0)
MCH: 32.9 pg (ref 26.0–34.0)
MCHC: 34.2 g/dL (ref 30.0–36.0)
MCV: 96.3 fL (ref 78.0–100.0)
MONO ABS: 0.4 10*3/uL (ref 0.1–1.0)
MONOS PCT: 20 %
NEUTROS PCT: 59 %
Neutro Abs: 1 10*3/uL — ABNORMAL LOW (ref 1.7–7.7)
Platelets: 147 10*3/uL — ABNORMAL LOW (ref 150–400)
RBC: 2.4 MIL/uL — ABNORMAL LOW (ref 4.22–5.81)
RDW: 14.1 % (ref 11.5–15.5)
WBC: 1.7 10*3/uL — ABNORMAL LOW (ref 4.0–10.5)

## 2017-04-20 LAB — POTASSIUM: Potassium: 3.3 mmol/L — ABNORMAL LOW (ref 3.5–5.1)

## 2017-04-20 LAB — MAGNESIUM: MAGNESIUM: 1.2 mg/dL — AB (ref 1.7–2.4)

## 2017-04-20 LAB — PHOSPHORUS: PHOSPHORUS: 3.4 mg/dL (ref 2.5–4.6)

## 2017-04-20 MED ORDER — MAGNESIUM SULFATE 4 GM/100ML IV SOLN
4.0000 g | Freq: Once | INTRAVENOUS | Status: AC
  Administered 2017-04-20: 4 g via INTRAVENOUS
  Filled 2017-04-20: qty 100

## 2017-04-20 MED ORDER — TBO-FILGRASTIM 480 MCG/0.8ML ~~LOC~~ SOSY
480.0000 ug | PREFILLED_SYRINGE | Freq: Once | SUBCUTANEOUS | Status: AC
  Administered 2017-04-20: 480 ug via SUBCUTANEOUS
  Filled 2017-04-20: qty 0.8

## 2017-04-20 MED ORDER — HYDROMORPHONE HCL 4 MG/ML IJ SOLN
2.0000 mg | INTRAMUSCULAR | Status: DC | PRN
  Administered 2017-04-20 – 2017-04-25 (×44): 2 mg via INTRAVENOUS
  Filled 2017-04-20 (×45): qty 1

## 2017-04-20 MED ORDER — POTASSIUM CHLORIDE 10 MEQ/100ML IV SOLN
10.0000 meq | INTRAVENOUS | Status: AC
Start: 2017-04-20 — End: 2017-04-20
  Administered 2017-04-20 (×2): 10 meq via INTRAVENOUS
  Filled 2017-04-20 (×2): qty 100

## 2017-04-20 NOTE — Progress Notes (Signed)
[name]   DOB:[birth year]   UJ#:[id number]   NFA#:[id number]  ONCOLOGY FOLLOW UP   Subjective: I'm covering Dr. Alvy Bimler this weekend. Patient was seen and examined her this morning. His pain overall is controlled, mouth sore tolerable, he drinks some water, not able to eat. He is on tube feeds, tolerated 357ml Osmolye this morning. No fever or other new complains. He states he has been urinating well, but did not tell his nurse about each urination. I/O 1185/625 ml yesterday. His vital signs stable.  Objective:  Vitals:   04/20/17 0031 04/20/17 0606  BP: 116/71 131/74  Pulse: 82 85  Resp: 16 16  Temp: 98.8 F (37.1 C) 98.3 F (36.8 C)    Body mass index is 23.05 kg/m.  Intake/Output Summary (Last 24 hours) at 04/20/17 1214 Last data filed at 04/20/17 0938  Gross per 24 hour  Intake             1260 ml  Output              200 ml  Net             1060 ml     Sclerae unicteric  (+) Severe mucositis in the lips and mouth  No peripheral adenopathy  Lungs clear -- no rales or rhonchi  Heart regular rate and rhythm  Abdomen benign  MSK no focal spinal tenderness, no peripheral edema   CBG (last 3)   Recent Labs  04/20/17 0602 04/20/17 0804 04/20/17 1156  GLUCAP 91 89 132*     Labs:  Lab Results  Component Value Date   WBC 1.7 (L) 04/20/2017   HGB 7.9 (L) 04/20/2017   HCT 23.1 (L) 04/20/2017   MCV 96.3 04/20/2017   PLT 147 (L) 04/20/2017   NEUTROABS 1.0 (L) 04/20/2017    @LASTCHEMISTRY @  Urine Studies No results for input(s): UHGB, CRYS in the last 72 hours.  Invalid input(s): UACOL, UAPR, USPG, UPH, UTP, UGL, UKET, UBIL, UNIT, UROB, ULEU, UEPI, UWBC, URBC, UBAC, CAST, [place], [place]  Basic Metabolic Panel:  Recent Labs Lab 04/17/17 1230 04/18/17 0450 04/19/17 0550 04/20/17 0515  NA 134* 134*  --   --   K 3.7 3.4* 3.6 3.3*  CL 93* 97*  --   --   CO2 29 28  --   --   GLUCOSE 102* 86  --   --   BUN 32* 25*  --   --   CREATININE 1.07 0.79  --   --    CALCIUM 7.9* 7.6*  --   --   MG 1.1* 1.7 1.0*  --   PHOS 4.5 4.2 2.9 3.4   GFR Estimated Creatinine Clearance: 87.9 mL/min (by C-G formula based on SCr of 0.79 mg/dL). Liver Function Tests:  Recent Labs Lab 04/17/17 1230 04/18/17 0450  AST 28 23  ALT 39 33  ALKPHOS 63 56  BILITOT 0.5 0.3  PROT 6.9 6.1*  ALBUMIN 3.3* 2.9*   No results for input(s): LIPASE, AMYLASE in the last 168 hours. No results for input(s): AMMONIA in the last 168 hours. Coagulation profile No results for input(s): INR, PROTIME in the last 168 hours.  CBC:  Recent Labs Lab 04/17/17 1230 04/20/17 0515  WBC 3.1* 1.7*  NEUTROABS  --  1.0*  HGB 9.6* 7.9*  HCT 29.0* 23.1*  MCV 94.8 96.3  PLT 173 147*   Cardiac Enzymes: No results for input(s): CKTOTAL, CKMB, CKMBINDEX, TROPONINI in the last 168 hours. BNP:  Invalid input(s): POCBNP CBG:  Recent Labs Lab 04/19/17 2200 04/20/17 0027 04/20/17 0602 04/20/17 0804 04/20/17 1156  GLUCAP 93 99 91 89 132*   D-Dimer No results for input(s): DDIMER in the last 72 hours. Hgb A1c No results for input(s): HGBA1C in the last 72 hours. Lipid Profile No results for input(s): CHOL, HDL, LDLCALC, TRIG, CHOLHDL, LDLDIRECT in the last 72 hours. Thyroid function studies No results for input(s): TSH, T4TOTAL, T3FREE, THYROIDAB in the last 72 hours.  Invalid input(s): FREET3 Anemia work up No results for input(s): VITAMINB12, FOLATE, FERRITIN, TIBC, IRON, RETICCTPCT in the last 72 hours. Microbiology Recent Results (from the past 240 hour(s))  Gastrointestinal Panel by PCR , Stool     Status: None   Collection Time: 04/17/17  4:28 PM  Result Value Ref Range Status   Campylobacter species NOT DETECTED NOT DETECTED Final   Plesimonas shigelloides NOT DETECTED NOT DETECTED Final   Salmonella species NOT DETECTED NOT DETECTED Final   Yersinia enterocolitica NOT DETECTED NOT DETECTED Final   Vibrio species NOT DETECTED NOT DETECTED Final   Vibrio cholerae  NOT DETECTED NOT DETECTED Final   Enteroaggregative E coli (EAEC) NOT DETECTED NOT DETECTED Final   Enteropathogenic E coli (EPEC) NOT DETECTED NOT DETECTED Final   Enterotoxigenic E coli (ETEC) NOT DETECTED NOT DETECTED Final   Shiga like toxin producing E coli (STEC) NOT DETECTED NOT DETECTED Final   Shigella/Enteroinvasive E coli (EIEC) NOT DETECTED NOT DETECTED Final   Cryptosporidium NOT DETECTED NOT DETECTED Final   Cyclospora cayetanensis NOT DETECTED NOT DETECTED Final   Entamoeba histolytica NOT DETECTED NOT DETECTED Final   Giardia lamblia NOT DETECTED NOT DETECTED Final   Adenovirus F40/41 NOT DETECTED NOT DETECTED Final   Astrovirus NOT DETECTED NOT DETECTED Final   Norovirus GI/GII NOT DETECTED NOT DETECTED Final   Rotavirus A NOT DETECTED NOT DETECTED Final   Sapovirus (I, II, IV, and V) NOT DETECTED NOT DETECTED Final      Studies:  Ct Abdomen Wo Contrast  Result Date: 04/19/2017 CLINICAL DATA:  Pain at gastrostomy tube site. EXAM: CT ABDOMEN WITHOUT CONTRAST TECHNIQUE: Multidetector CT imaging of the abdomen was performed following the standard protocol without IV contrast. COMPARISON:  CT scan 02/02/2014 FINDINGS: Lower chest: The lung bases are clear of acute process. Minimal dependent subpleural bibasilar atelectasis. No worrisome pulmonary lesions. Three-vessel coronary artery calcifications are noted. Hepatobiliary: A few small scattered hepatic cysts are noted. No worrisome hepatic lesions or intrahepatic biliary dilatation. The gallbladder is surgically absent. No common bile duct dilatation. Pancreas: No mass, inflammation or ductal dilatation. Small duodenum diverticulum noted near the pancreatic head. Spleen: Normal size.  No focal lesions. Adrenals/Urinary Tract: Stable left adrenal gland nodule consistent with benign adenoma. The right adrenal gland is normal. No renal mass or renal calculi. Stomach/Bowel: The stomach, duodenum, visualized small bowel and visualize  colon are unremarkable. The gastrostomy tube is in good position. No complicating features are identified. Vascular/Lymphatic: Atherosclerotic calcifications involving the aorta and branch vessels. There is a small saccular aneurysm noted along the medial aspect of the proximal right common iliac artery measuring 18.5 x 12.5 mm. No mesenteric or retroperitoneal mass or adenopathy. Small scattered lymph nodes are noted. Other: No ascites or abdominal wall hernia. Evidence of prior hernia repair with anterior abdominal wall mesh. Musculoskeletal: No significant bony findings. IMPRESSION: 1. The gastrostomy tube is in good position and I do not identify any complicating features associated with it. 2. No acute abdominal findings,  mass lesions or adenopathy. 3. Small left adrenal gland adenoma. 4. Advanced atherosclerotic calcifications involving the aorta and branch vessels. Three-vessel coronary artery calcifications are noted. 5. 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 If the Electronically Signed   By: Marijo Sanes M.D.   On: 04/19/2017 13:19    Assessment: 66 y.o.   1. Severe mucositis from radiation and chemotherapy, improving 2. Squamous cell carcinoma of lateral tongue, on concurrent chemoradiation 3. Malnutrition and weight loss, on tube feeds 4. Hypomagnesemia and hypokalemia, secondary to his previous diarrhea  5. Worsening neutropenia, secondary to chemotherapy, ANC 1.0 today  6. Worsening anemia, secondary to chemotherapy, Hb 7.9 today   Plan:  -continue supportive care and pain management, his pain is controlled overall, on Tylenol patch and IV Dilaudid -We'll increase his IV fluids to 75 mL per hour -We'll give Granix today and possible tomorrow, his last radiation was 2 days ago, next radiation on Monday -We'll type and cross 12 morning, he may need a blood transfusion tomorrow -Replace potassium and magnesium as needed -Repeat CBC, BMP, and  magnesium daily  Truitt Merle, MD 04/20/2017  12:14 PM

## 2017-04-21 LAB — CBC WITH DIFFERENTIAL/PLATELET
Basophils Absolute: 0 10*3/uL (ref 0.0–0.1)
Basophils Relative: 0 %
EOS ABS: 0.1 10*3/uL (ref 0.0–0.7)
EOS PCT: 4 %
HCT: 22.9 % — ABNORMAL LOW (ref 39.0–52.0)
Hemoglobin: 7.9 g/dL — ABNORMAL LOW (ref 13.0–17.0)
LYMPHS ABS: 0.2 10*3/uL — AB (ref 0.7–4.0)
Lymphocytes Relative: 7 %
MCH: 33.2 pg (ref 26.0–34.0)
MCHC: 34.5 g/dL (ref 30.0–36.0)
MCV: 96.2 fL (ref 78.0–100.0)
MONO ABS: 0.5 10*3/uL (ref 0.1–1.0)
MONOS PCT: 17 %
NEUTROS ABS: 2.2 10*3/uL (ref 1.7–7.7)
NEUTROS PCT: 72 %
PLATELETS: 161 10*3/uL (ref 150–400)
RBC: 2.38 MIL/uL — AB (ref 4.22–5.81)
RDW: 14.2 % (ref 11.5–15.5)
WBC: 3 10*3/uL — ABNORMAL LOW (ref 4.0–10.5)

## 2017-04-21 LAB — GLUCOSE, CAPILLARY
GLUCOSE-CAPILLARY: 145 mg/dL — AB (ref 65–99)
GLUCOSE-CAPILLARY: 154 mg/dL — AB (ref 65–99)
Glucose-Capillary: 83 mg/dL (ref 65–99)
Glucose-Capillary: 102 mg/dL — ABNORMAL HIGH (ref 65–99)
Glucose-Capillary: 140 mg/dL — ABNORMAL HIGH (ref 65–99)
Glucose-Capillary: 103 mg/dL — ABNORMAL HIGH (ref 65–99)

## 2017-04-21 LAB — BASIC METABOLIC PANEL
Anion gap: 10 (ref 5–15)
BUN: 16 mg/dL (ref 6–20)
CALCIUM: 7.5 mg/dL — AB (ref 8.9–10.3)
CO2: 28 mmol/L (ref 22–32)
CREATININE: 0.71 mg/dL (ref 0.61–1.24)
Chloride: 94 mmol/L — ABNORMAL LOW (ref 101–111)
GFR calc Af Amer: >60 mL/min (ref >60)
GLUCOSE: 84 mg/dL (ref 65–99)
Potassium: 3.2 mmol/L — ABNORMAL LOW (ref 3.5–5.1)
Sodium: 132 mmol/L — ABNORMAL LOW (ref 135–145)

## 2017-04-21 LAB — MAGNESIUM: MAGNESIUM: 2.1 mg/dL (ref 1.7–2.4)

## 2017-04-21 LAB — ABO/RH: ABO/RH(D): A POS

## 2017-04-21 NOTE — Progress Notes (Signed)
[name]   DOB:04-Apr-1951   JS#:[id number]   YWV#:[id number]  ONCOLOGY FOLLOW UP   Subjective: Pt feels better overall, less pain in the mouth, he has been using hydromorphone every 2-4 hours, able to drink some water. Tolerating tube feeding well. He has been ambulate in the hallway. VS stable, he has been urinating frequently due to the IVF, no dysuria.   Objective:  Vitals:   04/20/17 1931 04/21/17 0434  BP: 137/66 119/66  Pulse: 92 85  Resp: 16 16  Temp: 98.8 F (37.1 C) 99.3 F (37.4 C)    Body mass index is 23.46 kg/m.  Intake/Output Summary (Last 24 hours) at 04/21/17 1239 Last data filed at 04/21/17 1100  Gross per 24 hour  Intake          4079.08 ml  Output              300 ml  Net          3779.08 ml     Sclerae unicteric  (+) Severe mucositis in the lips and mouth  Lungs clear -- no rales or rhonchi  Heart regular rate and rhythm  Abdomen benign  MSK no focal spinal tenderness, no peripheral edema   CBG (last 3)   Recent Labs  04/21/17 0413 04/21/17 0759 04/21/17 1210  GLUCAP 83 102* 145*     Labs:  Lab Results  Component Value Date   WBC 3.0 (L) 04/21/2017   HGB 7.9 (L) 04/21/2017   HCT 22.9 (L) 04/21/2017   MCV 96.2 04/21/2017   PLT 161 04/21/2017   NEUTROABS 2.2 04/21/2017   CMP Latest Ref Rng & Units 04/21/2017 04/20/2017 04/19/2017  Glucose 65 - 99 mg/dL 84 - -  BUN 6 - 20 mg/dL 16 - -  Creatinine 0.61 - 1.24 mg/dL 0.71 - -  Sodium 135 - 145 mmol/L 132(L) - -  Potassium 3.5 - 5.1 mmol/L 3.2(L) 3.3(L) 3.6  Chloride 101 - 111 mmol/L 94(L) - -  CO2 22 - 32 mmol/L 28 - -  Calcium 8.9 - 10.3 mg/dL 7.5(L) - -  Total Protein 6.5 - 8.1 g/dL - - -  Total Bilirubin 0.3 - 1.2 mg/dL - - -  Alkaline Phos 38 - 126 U/L - - -  AST 15 - 41 U/L - - -  ALT 17 - 63 U/L - - -     Urine Studies No results for input(s): UHGB, CRYS in the last 72 hours.  Invalid input(s): UACOL, UAPR, USPG, UPH, UTP, UGL, UKET, UBIL, UNIT, UROB, [place], UEPI, UWBC,  Junie [address] [employer], [place], [place]  Basic Metabolic Panel:  Recent Labs Lab 04/17/17 1230 04/18/17 0450 04/19/17 0550 04/20/17 0515 04/21/17 0500  NA 134* 134*  --   --  132*  K 3.7 3.4* 3.6 3.3* 3.2*  CL 93* 97*  --   --  94*  CO2 29 28  --   --  28  GLUCOSE 102* 86  --   --  84  BUN 32* 25*  --   --  16  CREATININE 1.07 0.79  --   --  0.71  CALCIUM 7.9* 7.6*  --   --  7.5*  MG 1.1* 1.7 1.0* 1.2* 2.1  PHOS 4.5 4.2 2.9 3.4  --    GFR Estimated Creatinine Clearance: 87.9 mL/min (by C-G formula based on SCr of 0.71 mg/dL). Liver Function Tests:  Recent Labs Lab 04/17/17 1230 04/18/17 0450  AST 28 23  ALT 39  33  ALKPHOS 63 56  BILITOT 0.5 0.3  PROT 6.9 6.1*  ALBUMIN 3.3* 2.9*   No results for input(s): LIPASE, AMYLASE in the last 168 hours. No results for input(s): AMMONIA in the last 168 hours. Coagulation profile No results for input(s): INR, PROTIME in the last 168 hours.  CBC:  Recent Labs Lab 04/17/17 1230 04/20/17 0515 04/21/17 0500  WBC 3.1* 1.7* 3.0*  NEUTROABS  --  1.0* 2.2  HGB 9.6* 7.9* 7.9*  HCT 29.0* 23.1* 22.9*  MCV 94.8 96.3 96.2  PLT 173 147* 161   Cardiac Enzymes: No results for input(s): CKTOTAL, CKMB, CKMBINDEX, TROPONINI in the last 168 hours. BNP: Invalid input(s): POCBNP CBG:  Recent Labs Lab 04/20/17 2025 04/20/17 2326 04/21/17 0413 04/21/17 0759 04/21/17 1210  GLUCAP 123* 102* 83 102* 145*   D-Dimer No results for input(s): DDIMER in the last 72 hours. Hgb A1c No results for input(s): HGBA1C in the last 72 hours. Lipid Profile No results for input(s): CHOL, HDL, LDLCALC, TRIG, CHOLHDL, LDLDIRECT in the last 72 hours. Thyroid function studies No results for input(s): TSH, T4TOTAL, T3FREE, THYROIDAB in the last 72 hours.  Invalid input(s): FREET3 Anemia work up No results for input(s): VITAMINB12, FOLATE, FERRITIN, TIBC, IRON, RETICCTPCT in the last 72 hours. Microbiology Recent Results (from the past 240 hour(s))   Gastrointestinal Panel by PCR , Stool     Status: None   Collection Time: 04/17/17  4:28 PM  Result Value Ref Range Status   Campylobacter species NOT DETECTED NOT DETECTED Final   Plesimonas shigelloides NOT DETECTED NOT DETECTED Final   Salmonella species NOT DETECTED NOT DETECTED Final   Yersinia enterocolitica NOT DETECTED NOT DETECTED Final   Vibrio species NOT DETECTED NOT DETECTED Final   Vibrio cholerae NOT DETECTED NOT DETECTED Final   Enteroaggregative E coli (EAEC) NOT DETECTED NOT DETECTED Final   Enteropathogenic E coli (EPEC) NOT DETECTED NOT DETECTED Final   Enterotoxigenic E coli (ETEC) NOT DETECTED NOT DETECTED Final   Shiga like toxin producing E coli (STEC) NOT DETECTED NOT DETECTED Final   Shigella/Enteroinvasive E coli (EIEC) NOT DETECTED NOT DETECTED Final   Cryptosporidium NOT DETECTED NOT DETECTED Final   Cyclospora cayetanensis NOT DETECTED NOT DETECTED Final   Entamoeba histolytica NOT DETECTED NOT DETECTED Final   Giardia lamblia NOT DETECTED NOT DETECTED Final   Adenovirus F40/41 NOT DETECTED NOT DETECTED Final   Astrovirus NOT DETECTED NOT DETECTED Final   Norovirus GI/GII NOT DETECTED NOT DETECTED Final   Rotavirus A NOT DETECTED NOT DETECTED Final   Sapovirus (I, II, IV, and V) NOT DETECTED NOT DETECTED Final      Studies:  Ct Abdomen Wo Contrast  Result Date: 04/19/2017 CLINICAL DATA:  Pain at gastrostomy tube site. EXAM: CT ABDOMEN WITHOUT CONTRAST TECHNIQUE: Multidetector CT imaging of the abdomen was performed following the standard protocol without IV contrast. COMPARISON:  CT scan 02/02/2014 FINDINGS: Lower chest: The lung bases are clear of acute process. Minimal dependent subpleural bibasilar atelectasis. No worrisome pulmonary lesions. Three-vessel coronary artery calcifications are noted. Hepatobiliary: A few small scattered hepatic cysts are noted. No worrisome hepatic lesions or intrahepatic biliary dilatation. The gallbladder is surgically  absent. No common bile duct dilatation. Pancreas: No mass, inflammation or ductal dilatation. Small duodenum diverticulum noted near the pancreatic head. Spleen: Normal size.  No focal lesions. Adrenals/Urinary Tract: Stable left adrenal gland nodule consistent with benign adenoma. The right adrenal gland is normal. No renal mass or renal calculi. Stomach/Bowel:  The stomach, duodenum, visualized small bowel and visualize colon are unremarkable. The gastrostomy tube is in good position. No complicating features are identified. Vascular/Lymphatic: Atherosclerotic calcifications involving the aorta and branch vessels. There is a small saccular aneurysm noted along the medial aspect of the proximal right common iliac artery measuring 18.5 x 12.5 mm. No mesenteric or retroperitoneal mass or adenopathy. Small scattered lymph nodes are noted. Other: No ascites or abdominal wall hernia. Evidence of prior hernia repair with anterior abdominal wall mesh. Musculoskeletal: No significant bony findings. IMPRESSION: 1. The gastrostomy tube is in good position and I do not identify any complicating features associated with it. 2. No acute abdominal findings, mass lesions or adenopathy. 3. Small left adrenal gland adenoma. 4. Advanced atherosclerotic calcifications involving the aorta and branch vessels. Three-vessel coronary artery calcifications are noted. 5. 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 If the Electronically Signed   By: Marijo Sanes M.D.   On: 04/19/2017 13:19    Assessment: 66 y.o.   1. Severe mucositis from radiation and chemotherapy, improving 2. Squamous cell carcinoma of lateral tongue, on concurrent chemoradiation 3. Malnutrition and weight loss, on tube feeds 4. Hypomagnesemia and hypokalemia, secondary to his previous diarrhea  5. Neutropenia resolved, received 1 dose GRANIX on 5/19 6. anemia, secondary to chemotherapy, Hb 7.9 today   Plan:  -continue  supportive care and pain management, his pain is controlled overall, on fentanyle patch and IV Dilaudid every 2-4 hours -I/O positve (urine not accurate), We'll decrease his IV fluids to 35 mL per hour -will hold blood transfusion today, Hb stable 7.9 -Replace potassium  -Mag normalized today  -Repeat CBC, BMP, and magnesium daily -Dr. Alvy Bimler will resume care tomorrow morning   Truitt Merle, MD 04/21/2017  12:39 PM

## 2017-04-22 ENCOUNTER — Ambulatory Visit
Admission: RE | Admit: 2017-04-22 | Discharge: 2017-04-22 | Disposition: A | Discharge status: Home / Self Care | Payer: Medicare PPO | Source: Ambulatory Visit | Attending: Radiation Oncology | Admitting: Radiation Oncology

## 2017-04-22 ENCOUNTER — Encounter: Payer: Medicare PPO | Admitting: Radiation Oncology

## 2017-04-22 ENCOUNTER — Encounter: Payer: Self-pay | Admitting: Radiation Oncology

## 2017-04-22 ENCOUNTER — Ambulatory Visit: Payer: Medicare PPO

## 2017-04-22 ENCOUNTER — Encounter: Payer: No Typology Code available for payment source | Admitting: Physical Therapy

## 2017-04-22 VITALS — BP 135/79 | HR 83 | Temp 97.9°F | Resp 20 | Ht 68.0 in | Wt 153.6 lb

## 2017-04-22 DIAGNOSIS — K5900 Constipation, unspecified: Secondary | ICD-10-CM

## 2017-04-22 DIAGNOSIS — C021 Malignant neoplasm of border of tongue: Secondary | ICD-10-CM

## 2017-04-22 LAB — BASIC METABOLIC PANEL
Anion gap: 8 (ref 5–15)
BUN: 16 mg/dL (ref 6–20)
CHLORIDE: 97 mmol/L — AB (ref 101–111)
CO2: 29 mmol/L (ref 22–32)
CREATININE: 0.7 mg/dL (ref 0.61–1.24)
Calcium: 7.6 mg/dL — ABNORMAL LOW (ref 8.9–10.3)
GFR calc Af Amer: >60 mL/min (ref >60)
GFR calc non Af Amer: >60 mL/min (ref >60)
GLUCOSE: 152 mg/dL — AB (ref 65–99)
Potassium: 3.1 mmol/L — ABNORMAL LOW (ref 3.5–5.1)
SODIUM: 134 mmol/L — AB (ref 135–145)

## 2017-04-22 LAB — CBC WITH DIFFERENTIAL/PLATELET
BASOS ABS: 0 10*3/uL (ref 0.0–0.1)
Basophils Relative: 0 %
EOS ABS: 0.1 10*3/uL (ref 0.0–0.7)
Eosinophils Relative: 4 %
HCT: 21.8 % — ABNORMAL LOW (ref 39.0–52.0)
Hemoglobin: 7.4 g/dL — ABNORMAL LOW (ref 13.0–17.0)
LYMPHS ABS: 0.4 10*3/uL — AB (ref 0.7–4.0)
Lymphocytes Relative: 14 %
MCH: 32.9 pg (ref 26.0–34.0)
MCHC: 33.9 g/dL (ref 30.0–36.0)
MCV: 96.9 fL (ref 78.0–100.0)
Monocytes Absolute: 0.3 10*3/uL (ref 0.1–1.0)
Monocytes Relative: 11 %
NEUTROS ABS: 2.3 10*3/uL (ref 1.7–7.7)
Neutrophils Relative %: 71 %
Platelets: 135 10*3/uL — ABNORMAL LOW (ref 150–400)
RBC: 2.25 MIL/uL — ABNORMAL LOW (ref 4.22–5.81)
RDW: 14.2 % (ref 11.5–15.5)
WBC: 3.1 10*3/uL — ABNORMAL LOW (ref 4.0–10.5)

## 2017-04-22 LAB — GLUCOSE, CAPILLARY
GLUCOSE-CAPILLARY: 141 mg/dL — AB (ref 65–99)
GLUCOSE-CAPILLARY: 75 mg/dL (ref 65–99)
GLUCOSE-CAPILLARY: 92 mg/dL (ref 65–99)
GLUCOSE-CAPILLARY: 98 mg/dL (ref 65–99)
Glucose-Capillary: 149 mg/dL — ABNORMAL HIGH (ref 65–99)
Glucose-Capillary: 145 mg/dL — ABNORMAL HIGH (ref 65–99)

## 2017-04-22 LAB — MAGNESIUM: Magnesium: 1.1 mg/dL — ABNORMAL LOW (ref 1.7–2.4)

## 2017-04-22 LAB — PREPARE RBC (CROSSMATCH)

## 2017-04-22 MED ORDER — POTASSIUM CHLORIDE 10 MEQ/50ML IV SOLN
10.0000 meq | INTRAVENOUS | Status: AC
  Administered 2017-04-22 (×4): 10 meq via INTRAVENOUS
  Filled 2017-04-22 (×4): qty 50

## 2017-04-22 MED ORDER — DIPHENHYDRAMINE HCL 25 MG PO CAPS: 25.0000 mg | ORAL_CAPSULE | Freq: Once | ORAL | Status: DC

## 2017-04-22 MED ORDER — ACETAMINOPHEN 325 MG PO TABS
650.0000 mg | ORAL_TABLET | Freq: Once | ORAL | Status: AC
  Administered 2017-04-22: 650 mg via ORAL
  Filled 2017-04-22: qty 2

## 2017-04-22 MED ORDER — POLYETHYLENE GLYCOL 3350 17 G PO PACK
17.0000 g | PACK | Freq: Every day | ORAL | Status: DC
  Administered 2017-04-22 – 2017-04-23 (×2): 17 g via ORAL
  Filled 2017-04-22 (×2): qty 1

## 2017-04-22 MED ORDER — SENNOSIDES-DOCUSATE SODIUM 8.6-50 MG PO TABS
2.0000 | ORAL_TABLET | Freq: Two times a day (BID) | ORAL | Status: DC
  Administered 2017-04-23: 2 via ORAL
  Filled 2017-04-22 (×5): qty 2

## 2017-04-22 MED ORDER — DIPHENHYDRAMINE HCL 50 MG/ML IJ SOLN
25.0000 mg | Freq: Once | INTRAMUSCULAR | Status: AC
  Administered 2017-04-22: 25 mg via INTRAVENOUS
  Filled 2017-04-22: qty 1

## 2017-04-22 MED ORDER — SODIUM CHLORIDE 0.9 % IV SOLN: Freq: Once | INTRAVENOUS | Status: DC

## 2017-04-22 MED ORDER — MAGNESIUM SULFATE 4 GM/100ML IV SOLN
4.0000 g | Freq: Once | INTRAVENOUS | Status: AC
  Administered 2017-04-22: 4 g via INTRAVENOUS
  Filled 2017-04-22: qty 100

## 2017-04-22 NOTE — Progress Notes (Signed)
Nutrition Follow-up  DOCUMENTATION CODES:   Severe malnutrition in context of acute illness/injury  INTERVENTION:   Monitor magnesium, potassium, and phosphorus daily for at least 3 days, MD to replete as needed, as pt is at risk for refeeding syndrome given low Mg an K levels and pt not meeting estimated needs PTA via TF.  Continue Osmolite 1.5, 1.5 cans QID w/ 30 ml Prostat once daily.  Free water flushes with 60 ml before and after each bolus feed.  Pt will need additional free water of 240 ml (8oz) TID either via PEG or PO.   RD will continue to monitor  NUTRITION DIAGNOSIS:   Malnutrition (Severe) related to cancer and cancer related treatments, acute illness, dysphagia as evidenced by percent weight loss, moderate depletion of body fat, moderate depletions of muscle mass.  Ongoing.  GOAL:   Patient will meet greater than or equal to 90% of their needs  Progressing.  MONITOR:   PO intake, Labs, Weight trends, TF tolerance, I & O's  ASSESSMENT:   66 y.o. male patient with history of tongue cancer receiving radiation and chemotherapy.  Patient continues to advance bolus TF to goal rate. Has not successfully administered 6 cans total yet but is tolerating what he has received so far. Pt was having pain around PEG site over the weekend.  Pt with some constipation currently (MD ordered laxatives).  Will continue to monitor advancement.   Medications: Liquid MVI daily, Miralax packet daily, Senokot-S tablet BID,  Labs reviewed: CBGs: 98-149 Low Na, K, Mg  Diet Order:  Diet full liquid Room service appropriate? Yes; Fluid consistency: Thin  Skin:  Reviewed, no issues  Last BM:  5/16  Height:   Ht Readings from Last 1 Encounters:  04/17/17 5\' 8"  (1.727 m)    Weight:   Wt Readings from Last 1 Encounters:  04/21/17 154 lb 5.2 oz (70 kg)    Ideal Body Weight:  70 kg  BMI:  Body mass index is 23.46 kg/m.  Estimated Nutritional Needs:   Kcal:   2130-2330  Protein:  95-105g  Fluid:  2.3L/day  EDUCATION NEEDS:   Education needs addressed  Clayton Bibles, MS, RD, LDN Pager: [phone] After Hours Pager: [phone]

## 2017-04-22 NOTE — Progress Notes (Signed)
[name]   DOB:08/01/1951   PJ#:[id number]    Subjective: His pain is better control.  Denies nausea or vomiting.  He has no bowel movement for 4 days.  He is able to tolerate nutritional feeding  Objective:  Vitals:   04/21/17 2100 04/22/17 0400  BP: 138/66 133/77  Pulse: 81 89  Resp: 16 20  Temp: (!) 100.4 F (38 C) 99.4 F (37.4 C)     Intake/Output Summary (Last 24 hours) at 04/22/17 0934 Last data filed at 04/22/17 0600  Gross per 24 hour  Intake          2073.33 ml  Output             1000 ml  Net          1073.33 ml    GENERAL:alert, no distress and comfortable SKIN: Significant radiation dermatitis around his neck EYES: normal, Conjunctiva are pink and non-injected, sclera clear OROPHARYNX: Mucositis has improved.  He continues to have intermittent bleeding from his lips NECK: supple, thyroid normal size, non-tender, without nodularity LYMPH:  no palpable lymphadenopathy in the cervical, axillary or inguinal LUNGS: clear to auscultation and percussion with normal breathing effort HEART: regular rate & rhythm and no murmurs and no lower extremity edema ABDOMEN:abdomen soft, non-tender and normal bowel sounds.  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 He will continue radiation treatment as directed Due to profound side effects, I plan to keep him in the  hospital until 04/25/2017 at the completion date of his treatment  Weight loss He has progressive weight loss since surgery and chemo, exacerbated by fluid loss due to severe diarrhea We will consult dietitian while hospitalized for tube feeds He may be at risk of refeeding syndrome. We will monitor electrolytes carefully  Severe diarrhea, resolved Cause is unknown Stool culture negative We will give him aggressive fluid resuscitation for now  Hypomagnesemia, intermittent He has low magnesium likely due to chemo and loss from diarrhea I recommend magnesium replacement therapyintravenously as needed  Hypokalemia, intermittent Replace through the tube We will continue to check  Severe constipation I will start him on Senokot and daily MiraLAX  Near syncopal episode Likely due to profound dehydration We will give him aggressive IV fluid resuscitation  Severe mucositis due to antineoplastic therapy Mucositis is worse Candidiasis that resolved His pain is better control with addition of liquid morphine with fentanyl patch as needed Per patient request, will increase the dose of fentanyl patch.  Continue liquid morphine as needed for breakthrough pain  Acquired pancytopenia Due to recent treatment He is complaining of fatigue. Due to prior history of coronary artery disease and ongoing bleeding, I recommend blood transfusion. We discussed some of the risks, benefits, and alternatives of blood transfusions. The patient is symptomatic from  anemia and the hemoglobin level is critically low.  Some of the side-effects to be expected including risks of transfusion reactions, chills, infection, syndrome of volume overload and risk of hospitalization from various reasons and the patient is willing to proceed and went ahead to sign consent today. I plan to give him 2 units of blood  DVT prophylaxis On Lovenox  CODE STATUS Full code  Discharge planning He is mildly improved  but remain weak. He is not ready to go home Likely will be here the next few days until completion of treatment He is dependent in the IV fluid resuscitation and had significant electrolyte abnormalities We will continue to assess on a daily basis   Heath Lark, MD 04/22/2017  9:34 AM

## 2017-04-22 NOTE — Progress Notes (Signed)
[facility] Therapy Treatment Record Phone [phone]   Radiation Therapy was administered to [name] on: 04/22/2017  1:05 PM and was treatment # 27 out of a planned course of 30 treatments.  Radiation Treatment  1). Beam photons with 6-10 energy and Photons 6-10 MeV  2). Brachytherapy None  3). Stereotactic Radiosurgery None  4). Other Radiation None     Dejon Lukas, Niana Martorana, RT (T)

## 2017-04-22 NOTE — Progress Notes (Signed)
Weekly rad txs tongue,b/l neck, 27-30 treatments  thick saliva, excoriated dry desquamation on neck, uses biafine  , has IVF"S  Infusingnormal saline via right port, intact, no redness at site,  Says he is drinking ,swallowing, HGB today 7.4, will get PRBC back on uinit, in w/c, offered kleenix and coffee per patient request BP 135/79   Pulse 83   Temp 97.9 F (36.6 C) (Oral) Comment: axilla  Resp 20   Ht 5\' 8"  (1.727 m)   Wt 153 lb 9.6 oz (69.7 kg)   SpO2 91% Comment: room air  BMI 23.35 kg/m   Wt Readings from Last 3 Encounters:  04/21/17 154 lb 5.2 oz (70 kg)  04/22/17 153 lb 9.6 oz (69.7 kg)  04/17/17 146 lb 1.6 oz (66.3 kg)

## 2017-04-22 NOTE — Progress Notes (Addendum)
Pt to have radiation tx Tuesday,Wednesday and Thursday this week. Pt  Bleeding behind his  Rt. Ear lobe and small amounts from his nose. He states he's tongue feels like it's starting to swell, denies any difficulty breathing. Instructed to call if he starts to have any difficulty breathing.

## 2017-04-23 ENCOUNTER — Ambulatory Visit
Admission: RE | Admit: 2017-04-23 | Discharge: 2017-04-23 | Disposition: A | Discharge status: Home / Self Care | Payer: Medicare PPO | Source: Ambulatory Visit | Attending: Radiation Oncology | Admitting: Radiation Oncology

## 2017-04-23 ENCOUNTER — Ambulatory Visit: Payer: Medicare PPO

## 2017-04-23 DIAGNOSIS — C021 Malignant neoplasm of border of tongue: Secondary | ICD-10-CM

## 2017-04-23 DIAGNOSIS — E860 Dehydration: Secondary | ICD-10-CM

## 2017-04-23 DIAGNOSIS — E871 Hypo-osmolality and hyponatremia: Secondary | ICD-10-CM

## 2017-04-23 LAB — CBC WITH DIFFERENTIAL/PLATELET
BASOS PCT: 0 %
Basophils Absolute: 0 10*3/uL (ref 0.0–0.1)
EOS ABS: 0.1 10*3/uL (ref 0.0–0.7)
EOS PCT: 2 %
HCT: 29.9 % — ABNORMAL LOW (ref 39.0–52.0)
Hemoglobin: 9.8 g/dL — ABNORMAL LOW (ref 13.0–17.0)
Lymphocytes Relative: 12 %
Lymphs Abs: 0.5 10*3/uL — ABNORMAL LOW (ref 0.7–4.0)
MCH: 30.5 pg (ref 26.0–34.0)
MCHC: 32.8 g/dL (ref 30.0–36.0)
MCV: 93.1 fL (ref 78.0–100.0)
Monocytes Absolute: 0.6 10*3/uL (ref 0.1–1.0)
Monocytes Relative: 15 %
NEUTROS PCT: 71 %
Neutro Abs: 3 10*3/uL (ref 1.7–7.7)
PLATELETS: 162 10*3/uL (ref 150–400)
RBC: 3.21 MIL/uL — AB (ref 4.22–5.81)
RDW: 15.1 % (ref 11.5–15.5)
WBC: 4.2 10*3/uL (ref 4.0–10.5)

## 2017-04-23 LAB — TYPE AND SCREEN
ABO/RH(D): A POS
ANTIBODY SCREEN: NEGATIVE
Unit division: 0
Unit division: 0

## 2017-04-23 LAB — GLUCOSE, CAPILLARY
GLUCOSE-CAPILLARY: 98 mg/dL (ref 65–99)
GLUCOSE-CAPILLARY: 136 mg/dL — AB (ref 65–99)
GLUCOSE-CAPILLARY: 104 mg/dL — AB (ref 65–99)
Glucose-Capillary: 106 mg/dL — ABNORMAL HIGH (ref 65–99)
Glucose-Capillary: 137 mg/dL — ABNORMAL HIGH (ref 65–99)
Glucose-Capillary: 95 mg/dL (ref 65–99)

## 2017-04-23 LAB — BASIC METABOLIC PANEL
ANION GAP: 6 (ref 5–15)
BUN: 13 mg/dL (ref 6–20)
CALCIUM: 7.7 mg/dL — AB (ref 8.9–10.3)
CO2: 30 mmol/L (ref 22–32)
Chloride: 97 mmol/L — ABNORMAL LOW (ref 101–111)
Creatinine, Ser: 0.64 mg/dL (ref 0.61–1.24)
GFR calc Af Amer: >60 mL/min (ref >60)
GLUCOSE: 97 mg/dL (ref 65–99)
Potassium: 3.5 mmol/L (ref 3.5–5.1)
SODIUM: 133 mmol/L — AB (ref 135–145)

## 2017-04-23 LAB — BPAM RBC
BLOOD PRODUCT EXPIRATION DATE: 201806062359
Blood Product Expiration Date: 201806062359
ISSUE DATE / TIME: 201805211605
ISSUE DATE / TIME: 201805212212
UNIT TYPE AND RH: 6200
UNIT TYPE AND RH: 6200

## 2017-04-23 LAB — MAGNESIUM: MAGNESIUM: 1.3 mg/dL — AB (ref 1.7–2.4)

## 2017-04-23 MED ORDER — MAGNESIUM CITRATE PO SOLN
1.0000 | Freq: Once | ORAL | Status: DC
  Filled 2017-04-23: qty 296

## 2017-04-23 MED ORDER — MAGNESIUM SULFATE 4 GM/100ML IV SOLN
4.0000 g | Freq: Once | INTRAVENOUS | Status: AC
  Administered 2017-04-23: 4 g via INTRAVENOUS
  Filled 2017-04-23: qty 100

## 2017-04-23 MED ORDER — BIAFINE EX EMUL
CUTANEOUS | Status: DC | PRN
Start: 2017-04-23 — End: 2017-04-24
  Administered 2017-04-23: 15:00:00 via TOPICAL

## 2017-04-23 NOTE — Progress Notes (Signed)
Weekly Management Note:  Inpatient    ICD-9-CM ICD-10-CM   1. 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HCC) 141.2 C02.1 morphine CONCENTRATE 10 MG/0.5ML oral solution 10 mg     magic mouthwash w/lidocaine     lidocaine-prilocaine (EMLA) cream     acetaminophen (TYLENOL) tablet 650 mg     ondansetron (ZOFRAN) tablet 4-8 mg     ondansetron (ZOFRAN-ODT) disintegrating tablet 4-8 mg     ondansetron (ZOFRAN) injection 4 mg     ondansetron (ZOFRAN) 8 mg in sodium chloride 0.9 % 50 mL IVPB     enoxaparin (LOVENOX) injection 40 mg     0.9 %  sodium chloride infusion     fentaNYL (DURAGESIC - dosed mcg/hr) 37.5 mcg     HYDROmorphone (DILAUDID) injection 2 mg     senna-docusate (Senokot-S) tablet 2 tablet     DISCONTINUED: fentaNYL (DURAGESIC - dosed mcg/hr) patch 25 mcg     DISCONTINUED: senna-docusate (Senokot-S) tablet 1 tablet     DISCONTINUED: HYDROmorphone (DILAUDID) injection 1 mg     DISCONTINUED: 0.9 %  sodium chloride infusion     DISCONTINUED: HYDROmorphone (DILAUDID) injection 1 mg     DISCONTINUED: HYDROmorphone (DILAUDID) injection 1 mg     DISCONTINUED: HYDROmorphone (DILAUDID) injection 2 mg  2. Weight loss 783.21 R63.4 morphine CONCENTRATE 10 MG/0.5ML oral solution 10 mg     magic mouthwash w/lidocaine     lidocaine-prilocaine (EMLA) cream     acetaminophen (TYLENOL) tablet 650 mg     ondansetron (ZOFRAN) tablet 4-8 mg     ondansetron (ZOFRAN-ODT) disintegrating tablet 4-8 mg     ondansetron (ZOFRAN) injection 4 mg     ondansetron (ZOFRAN) 8 mg in sodium chloride 0.9 % 50 mL IVPB     enoxaparin (LOVENOX) injection 40 mg     0.9 %  sodium chloride infusion     fentaNYL (DURAGESIC - dosed mcg/hr) 37.5 mcg     HYDROmorphone (DILAUDID) injection 2 mg     senna-docusate (Senokot-S) tablet 2 tablet     DISCONTINUED: fentaNYL (DURAGESIC - dosed mcg/hr) patch 25 mcg     DISCONTINUED: senna-docusate (Senokot-S) tablet 1 tablet     DISCONTINUED: HYDROmorphone (DILAUDID)  injection 1 mg     DISCONTINUED: HYDROmorphone (DILAUDID) injection 1 mg     DISCONTINUED: HYDROmorphone (DILAUDID) injection 1 mg     DISCONTINUED: HYDROmorphone (DILAUDID) injection 2 mg  3. Mucositis due to antineoplastic therapy 528.01 K12.31 morphine CONCENTRATE 10 MG/0.5ML oral solution 10 mg     magic mouthwash w/lidocaine     lidocaine-prilocaine (EMLA) cream     acetaminophen (TYLENOL) tablet 650 mg     ondansetron (ZOFRAN) tablet 4-8 mg     ondansetron (ZOFRAN-ODT) disintegrating tablet 4-8 mg     ondansetron (ZOFRAN) injection 4 mg     ondansetron (ZOFRAN) 8 mg in sodium chloride 0.9 % 50 mL IVPB     enoxaparin (LOVENOX) injection 40 mg     0.9 %  sodium chloride infusion     fentaNYL (DURAGESIC - dosed mcg/hr) 37.5 mcg     HYDROmorphone (DILAUDID) injection 2 mg     senna-docusate (Senokot-S) tablet 2 tablet     DISCONTINUED: fentaNYL (DURAGESIC - dosed mcg/hr) patch 25 mcg     DISCONTINUED: senna-docusate (Senokot-S) tablet 1 tablet     DISCONTINUED: HYDROmorphone (DILAUDID) injection 1 mg     DISCONTINUED: HYDROmorphone (DILAUDID) injection 1 mg     DISCONTINUED: HYDROmorphone (DILAUDID) injection 1 mg  DISCONTINUED: HYDROmorphone (DILAUDID) injection 2 mg  4. Hypomagnesemia 275.2 E83.42 magnesium sulfate IVPB 4 g 100 mL     magnesium sulfate IVPB 4 g 100 mL     magnesium sulfate IVPB 4 g 100 mL     magnesium sulfate IVPB 4 g 100 mL  5. Hypokalemia 276.8 E87.6 potassium chloride 20 MEQ/15ML (10%) solution 20 mEq     potassium chloride 10 mEq in 100 mL IVPB     magnesium sulfate IVPB 4 g 100 mL     potassium chloride 10 mEq in 50 mL *CENTRAL LINE* IVPB     magnesium sulfate IVPB 4 g 100 mL  6. Feeding tube dysfunction 996.59 T85.598A CANCELED: IR Radiologist Eval & Mgmt     CANCELED: IR Radiologist Eval & Mgmt  7. Abdominal pain 789.00 R10.9 CT ABDOMEN WO CONTRAST     CT ABDOMEN WO CONTRAST     CANCELED: CT ABDOMEN LIMITED WO CONTRAST     CANCELED: CT ABDOMEN  LIMITED WO CONTRAST  8. Neutropenia, drug-induced (HCC) 288.03 D70.2 Tbo-Filgrastim (GRANIX) injection 480 mcg   E980.5    9. Anemia due to antineoplastic chemotherapy 285.3 D64.81 0.9 %  sodium chloride infusion   E933.1 T45.1X5A acetaminophen (TYLENOL) tablet 650 mg     diphenhydrAMINE (BENADRYL) capsule 25 mg  10. Constipation, unspecified constipation type 564.00 K59.00 polyethylene glycol (MIRALAX / GLYCOLAX) packet 17 g     magnesium citrate solution 1 Bottle    Current Dose:  54 Gy  Projected Dose: 60 Gy   Narrative:  The patient presents for routine under treatment assessment.  CBCT/MVCT images/Port film x-rays were reviewed.  The chart was checked. Currently inpatient. Missed a couple fractions of RT due to feeling very bad from side effects, pain in mouth. Pain better controlled now.  Receiving PRBCs today due to anemia.  Physical Findings:  Wt Readings from Last 3 Encounters:  04/23/17 160 lb (72.6 kg)  04/22/17 153 lb 9.6 oz (69.7 kg)  04/17/17 146 lb 1.6 oz (66.3 kg)    height is 5\' 8"  (1.727 m) and weight is 160 lb (72.6 kg). His axillary temperature is 98.7 F (37.1 C). His blood pressure is 148/70 (abnormal) and his pulse is 79. His respiration is 16 and oxygen saturation is 94%.  NAD, in wheelchair, conversant, treatment related tongue swelling, no thrush, mucosa moist, no stridor.  Skin with dry desquamation over neck, more desquamation behind ears.  CBC    Component Value Date/Time   WBC 4.2 04/23/2017 0430   RBC 3.21 (L) 04/23/2017 0430   HGB 9.8 (L) 04/23/2017 0430   HGB 11.3 (L) 04/10/2017 0841   HCT 29.9 (L) 04/23/2017 0430   HCT 33.7 (L) 04/10/2017 0841   PLT 162 04/23/2017 0430   PLT 180 04/10/2017 0841   MCV 93.1 04/23/2017 0430   MCV 95.9 04/10/2017 0841   MCH 30.5 04/23/2017 0430   MCHC 32.8 04/23/2017 0430   RDW 15.1 04/23/2017 0430   RDW 12.9 04/10/2017 0841   LYMPHSABS 0.5 (L) 04/23/2017 0430   LYMPHSABS 0.4 (L) 04/10/2017 0841   MONOABS 0.6  04/23/2017 0430   MONOABS 0.5 04/10/2017 0841   EOSABS 0.1 04/23/2017 0430   EOSABS 0.2 04/10/2017 0841   BASOSABS 0.0 04/23/2017 0430   BASOSABS 0.0 04/10/2017 0841     CMP     Component Value Date/Time   NA 133 (L) 04/23/2017 0430   NA 135 (L) 04/10/2017 0841   K 3.5 04/23/2017 0430  K 4.1 04/10/2017 0841   CL 97 (L) 04/23/2017 0430   CO2 30 04/23/2017 0430   CO2 29 04/10/2017 0841   GLUCOSE 97 04/23/2017 0430   GLUCOSE 94 04/10/2017 0841   BUN 13 04/23/2017 0430   BUN 34.1 (H) 04/10/2017 0841   CREATININE 0.64 04/23/2017 0430   CREATININE 1.1 04/10/2017 0841   CALCIUM 7.7 (L) 04/23/2017 0430   CALCIUM 9.6 04/10/2017 0841   PROT 6.1 (L) 04/18/2017 0450   PROT 7.6 04/10/2017 0841   ALBUMIN 2.9 (L) 04/18/2017 0450   ALBUMIN 3.4 (L) 04/10/2017 0841   AST 23 04/18/2017 0450   AST 29 04/10/2017 0841   ALT 33 04/18/2017 0450   ALT 38 04/10/2017 0841   ALKPHOS 56 04/18/2017 0450   ALKPHOS 74 04/10/2017 0841   BILITOT 0.3 04/18/2017 0450   BILITOT 0.66 04/10/2017 0841   GFRNONAA >60 04/23/2017 0430   GFRAA >60 04/23/2017 0430     Impression:  The patient is tolerating radiotherapy.   Plan:  Continue radiotherapy as planned. Patient understands importance of minimizing missed radiotherapy treatments for best chance of cure.  Continue Biafine over dry skin and neosporin in areas where skin is moist from peeling. F/u with med onc closely and in rad/onc in one half month after completion.    -----------------------------------  Eppie Gibson, MD

## 2017-04-23 NOTE — Progress Notes (Signed)
[name]   DOB:Nov 10, 1951   KG#:[id number]    Subjective: He complained of persistent throat pain but feeling a bit stronger.  His throat pain is rated at 7 out of 10 but he is comfortable with current prescription fentanyl patch. He had bowel movement this morning, small amount. He is able to tolerate nutritional feeding. He felt stronger after blood transfusion  Objective:  Vitals:   04/22/17 2240 04/23/17 0100  BP: (!) 151/8 (!) 148/70  Pulse: 88 79  Resp: 16 16  Temp: 98.8 F (37.1 C) 98.7 F (37.1 C)     Intake/Output Summary (Last 24 hours) at 04/23/17 6644 Last data filed at 04/23/17 0100  Gross per 24 hour  Intake           2518.5 ml  Output                0 ml  Net           2518.5 ml    GENERAL:alert, no distress and comfortable SKIN: He has mild ulcerated skin dermatitis from radiation treatment that bleeds intermittently. EYES: normal, Conjunctiva are pink and non-injected, sclera clear OROPHARYNX: He had mucositis.  No thrush NECK: supple, thyroid normal size, non-tender, without nodularity LYMPH:  no palpable lymphadenopathy in the cervical, axillary or inguinal LUNGS: clear to auscultation and percussion with normal breathing effort HEART: regular rate & rhythm and no murmurs and no lower extremity edema ABDOMEN:abdomen soft, non-tender and normal bowel sounds Musculoskeletal:no cyanosis of digits and no clubbing  NEURO: alert & oriented x 3 with fluent speech, no focal motor/sensory deficits   Labs:  Lab Results  Component Value Date   WBC 4.2 04/23/2017   HGB 9.8 (L) 04/23/2017   HCT 29.9 (L) 04/23/2017   MCV 93.1 04/23/2017   PLT 162 04/23/2017   NEUTROABS 3.0 04/23/2017    Lab Results  Component Value Date   NA 133 (L) 04/23/2017   K 3.5 04/23/2017   CL 97 (L) 04/23/2017   CO2 30 04/23/2017    Assessment & Plan:   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 He will continue radiation treatment as directed Due to profound side effects, I plan to keep him in the hospital until 04/25/2017,  at the completion date of his radiation treatment  Weight loss He has progressive weight loss since surgery and chemo, exacerbated by fluid loss due to severe diarrhea We will consult dietitian while hospitalized for tube feeds He may be at risk of refeeding syndrome. We will monitor electrolytes carefully  Severe diarrhea, resolved Cause is unknown Stool culture negative We will give him aggressive fluid resuscitation for now  Hypomagnesemia, intermittent He has low magnesium likely due to chemo and loss from diarrhea I recommend magnesium replacement therapyintravenously as needed  Hypokalemia, intermittent Replace through the tube We will continue to check  Severe constipation I will start him on Senokot and daily MiraLAX We will add magnesium citrate  Near syncopal episode, dehydration, hyponatremia Likely due to profound dehydration We will give him aggressive IV fluid resuscitation  Severe mucositis due to antineoplastic therapy Mucositis is worse Candidiasis that resolved His pain is better control with addition of liquid morphine with fentanyl patch as needed Per patient request, will increase the dose of fentanyl patch. Continue liquid morphine as needed for breakthrough pain  Acquired pancytopenia Due to recent treatment He is complaining of fatigue. He received 2  units of blood transfusion on 04/22/2017  DVT prophylaxis On Lovenox  CODE STATUS Full code  Discharge planning He is mildlyimproved but remain weak. He is not ready to go home Likely will be here the next few days until completion of treatment He is dependent in the IV fluid resuscitation and had significant electrolyte abnormalities We will continue to assess on a daily basis    Heath Lark, MD 04/23/2017  8:42 AM

## 2017-04-23 NOTE — Progress Notes (Signed)
[facility] Therapy Treatment Record Phone [phone]   Radiation Therapy was administered to [name] on: 04/23/2017  10:08 AM and was treatment # 28 out of a planned course of 30 treatments.  Radiation Treatment  1). Beam photons with 6-10 energy and Photons 6-10 MeV  2). Brachytherapy None  3). Stereotactic Radiosurgery None  4). Other Radiation None     Kristian Mogg, [name], RT (T)

## 2017-04-24 ENCOUNTER — Encounter: Payer: No Typology Code available for payment source | Admitting: Physical Therapy

## 2017-04-24 ENCOUNTER — Ambulatory Visit
Admission: RE | Admit: 2017-04-24 | Discharge: 2017-04-24 | Disposition: A | Discharge status: Home / Self Care | Payer: Medicare PPO | Source: Ambulatory Visit | Attending: Radiation Oncology | Admitting: Radiation Oncology

## 2017-04-24 ENCOUNTER — Other Ambulatory Visit: Payer: No Typology Code available for payment source

## 2017-04-24 ENCOUNTER — Ambulatory Visit: Payer: Medicare PPO

## 2017-04-24 ENCOUNTER — Telehealth: Payer: Self-pay | Admitting: Hematology and Oncology

## 2017-04-24 DIAGNOSIS — D61818 Other pancytopenia: Secondary | ICD-10-CM

## 2017-04-24 LAB — CBC WITH DIFFERENTIAL/PLATELET
BASOS ABS: 0 10*3/uL (ref 0.0–0.1)
BASOS PCT: 0 %
Eosinophils Absolute: 0.1 10*3/uL (ref 0.0–0.7)
Eosinophils Relative: 3 %
HCT: 29.6 % — ABNORMAL LOW (ref 39.0–52.0)
HEMOGLOBIN: 10.1 g/dL — AB (ref 13.0–17.0)
Lymphocytes Relative: 15 %
Lymphs Abs: 0.6 10*3/uL — ABNORMAL LOW (ref 0.7–4.0)
MCH: 31.6 pg (ref 26.0–34.0)
MCHC: 34.1 g/dL (ref 30.0–36.0)
MCV: 92.5 fL (ref 78.0–100.0)
Monocytes Absolute: 0.7 10*3/uL (ref 0.1–1.0)
Monocytes Relative: 18 %
NEUTROS ABS: 2.4 10*3/uL (ref 1.7–7.7)
NEUTROS PCT: 64 %
Platelets: 170 10*3/uL (ref 150–400)
RBC: 3.2 MIL/uL — ABNORMAL LOW (ref 4.22–5.81)
RDW: 14.7 % (ref 11.5–15.5)
WBC: 3.8 10*3/uL — ABNORMAL LOW (ref 4.0–10.5)

## 2017-04-24 LAB — GLUCOSE, CAPILLARY
GLUCOSE-CAPILLARY: 122 mg/dL — AB (ref 65–99)
GLUCOSE-CAPILLARY: 88 mg/dL (ref 65–99)
GLUCOSE-CAPILLARY: 92 mg/dL (ref 65–99)
GLUCOSE-CAPILLARY: 93 mg/dL (ref 65–99)
Glucose-Capillary: 157 mg/dL — ABNORMAL HIGH (ref 65–99)
Glucose-Capillary: 93 mg/dL (ref 65–99)

## 2017-04-24 LAB — MAGNESIUM: Magnesium: 1.3 mg/dL — ABNORMAL LOW (ref 1.7–2.4)

## 2017-04-24 MED ORDER — MAGNESIUM SULFATE 4 GM/100ML IV SOLN
4.0000 g | Freq: Once | INTRAVENOUS | Status: AC
  Administered 2017-04-24: 4 g via INTRAVENOUS
  Filled 2017-04-24: qty 100

## 2017-04-24 NOTE — Progress Notes (Signed)
[facility] Therapy Treatment Record Phone [phone]   Radiation Therapy was administered to [name] on: 04/24/2017  2:45 PM and was treatment #29 out of a planned course of 30treatments.  Radiation Treatment  1). Beam photons with 6-10 energy  2). Brachytherapy None  3). Stereotactic Radiosurgery None  4). Other Radiation None     [name], RT (T)

## 2017-04-24 NOTE — Progress Notes (Signed)
Pt refusing bolus feeding at this time. Will reassess after pt's radiation therapy.

## 2017-04-24 NOTE — Progress Notes (Signed)
[name]   DOB:[birth year]   DP#:[id number]    Subjective: The patient continues to have throat pain.  He felt that current dose of fentanyl patch was adequate to control his pain.  He denies nausea.  He had multiple bowel movements today  Objective:  Vitals:   04/23/17 2054 04/24/17 0415  BP: (!) 158/82 (!) 153/82  Pulse: 92 92  Resp: 18 16  Temp: 99.2 F (37.3 C) 98.3 F (36.8 C)     Intake/Output Summary (Last 24 hours) at 04/24/17 0959 Last data filed at 04/24/17 4431  Gross per 24 hour  Intake          2684.25 ml  Output                0 ml  Net          2684.25 ml    GENERAL:alert, no distress and comfortable SKIN: Significant radiation-induced skin changes with mild ulceration and bleeding EYES: normal, Conjunctiva are pink and non-injected, sclera clear OROPHARYNX: Noted mucositis NECK: supple, thyroid normal size, non-tender, without nodularity LYMPH:  no palpable lymphadenopathy in the cervical, axillary or inguinal LUNGS: clear to auscultation and percussion with normal breathing effort HEART: regular rate & rhythm and no murmurs and no lower extremity edema ABDOMEN:abdomen soft, non-tender and normal bowel sounds Musculoskeletal:no cyanosis of digits and no clubbing  NEURO: alert & oriented x 3 with fluent speech, no focal motor/sensory deficits   Labs:  Lab Results  Component Value Date   WBC 3.8 (L) 04/24/2017   HGB 10.1 (L) 04/24/2017   HCT 29.6 (L) 04/24/2017   MCV 92.5 04/24/2017   PLT 170 04/24/2017   NEUTROABS 2.4 04/24/2017    Lab Results  Component Value Date   NA 133 (L) 04/23/2017   K 3.5 04/23/2017   CL 97 (L) 04/23/2017   CO2 30 04/23/2017   Assessment & Plan:   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HCC) He has started to experience worsening side-effects I will discontinue his chemotherapy. He will continue radiation treatment as directed Due to profound side effects, I plan to keep him in the hospital until 04/25/2017,  at  the completion date of his radiation treatment  Weight loss He has progressive weight loss since surgery and chemo, exacerbated by fluid loss due to severe diarrhea We will consult dietitian while hospitalized for tube feeds He may be at risk of refeeding syndrome. We will monitor electrolytes carefully  Severe diarrhea, resolved Cause is unknown Stool culture negative We will give him aggressive fluid resuscitation for now  Hypomagnesemia, intermittent He has low magnesium likely due to chemo and loss from diarrhea I recommend magnesium replacement therapyintravenously as needed  Hypokalemia, intermittent Replace through the tube We will continue to check  Severe constipation, resolved I will continue Senokot and daily MiraLAX  Near syncopal episode, dehydration, hyponatremia Likely due to profound dehydration We will give him aggressive IV fluid resuscitation  Severe mucositis due to antineoplastic therapy Mucositis is worse Candidiasis that resolved His pain is better control with addition of liquid morphine with fentanyl patch as needed Per patient request, will increase the dose of fentanyl patch. Continue liquid morphine as needed for breakthrough pain  Acquired pancytopenia Due to recent treatment He is complaining of fatigue. He received 2 units of blood transfusion on 04/22/2017  DVT prophylaxis On Lovenox  CODE STATUS Full code  Discharge planning He is mildlyimproved but remain weak. He is not ready to go home Hopefully discharge  tomorrow after radiation is completed. Heath Lark, MD 04/24/2017  9:59 AM

## 2017-04-24 NOTE — Telephone Encounter (Signed)
Faxed records to [facility] [phone]

## 2017-04-24 NOTE — Care Management Important Message (Signed)
Important Message  Patient Details  Name: JAISHAUN MCNAB MRN: [id number] Date of Birth: 1951/09/26   Medicare Important Message Given:  Yes    Kerin Salen 04/24/2017, 10:55 AMImportant Message  Patient Details  Name: WILLARD FARQUHARSON MRN: [id number] Date of Birth: [birth year]   Medicare Important Message Given:  Yes    Kerin Salen 04/24/2017, 10:55 AM

## 2017-04-24 NOTE — Progress Notes (Signed)
Pt does not want bolus feeding at this time. Requesting to take feeding closer to bedtime tonight.

## 2017-04-25 ENCOUNTER — Encounter: Payer: Self-pay | Admitting: *Deleted

## 2017-04-25 ENCOUNTER — Ambulatory Visit
Admission: RE | Admit: 2017-04-25 | Discharge: 2017-04-25 | Disposition: A | Discharge status: Home / Self Care | Payer: Medicare PPO | Source: Ambulatory Visit | Attending: Radiation Oncology | Admitting: Radiation Oncology

## 2017-04-25 ENCOUNTER — Encounter: Payer: Self-pay | Admitting: Radiation Oncology

## 2017-04-25 LAB — MAGNESIUM: MAGNESIUM: 1.3 mg/dL — AB (ref 1.7–2.4)

## 2017-04-25 LAB — CBC WITH DIFFERENTIAL/PLATELET
Basophils Absolute: 0 10*3/uL (ref 0.0–0.1)
Basophils Relative: 0 %
Eosinophils Absolute: 0.2 10*3/uL (ref 0.0–0.7)
Eosinophils Relative: 5 %
HCT: 28.7 % — ABNORMAL LOW (ref 39.0–52.0)
HEMOGLOBIN: 9.8 g/dL — AB (ref 13.0–17.0)
LYMPHS ABS: 0.7 10*3/uL (ref 0.7–4.0)
LYMPHS PCT: 22 %
MCH: 32 pg (ref 26.0–34.0)
MCHC: 34.1 g/dL (ref 30.0–36.0)
MCV: 93.8 fL (ref 78.0–100.0)
Monocytes Absolute: 0.8 10*3/uL (ref 0.1–1.0)
Monocytes Relative: 24 %
NEUTROS ABS: 1.6 10*3/uL — AB (ref 1.7–7.7)
NEUTROS PCT: 49 %
Platelets: 185 10*3/uL (ref 150–400)
RBC: 3.06 MIL/uL — AB (ref 4.22–5.81)
RDW: 14.7 % (ref 11.5–15.5)
WBC: 3.3 10*3/uL — AB (ref 4.0–10.5)

## 2017-04-25 LAB — GLUCOSE, CAPILLARY
Glucose-Capillary: 93 mg/dL (ref 65–99)
Glucose-Capillary: 101 mg/dL — ABNORMAL HIGH (ref 65–99)
Glucose-Capillary: 151 mg/dL — ABNORMAL HIGH (ref 65–99)

## 2017-04-25 MED ORDER — HEPARIN SOD (PORK) LOCK FLUSH 100 UNIT/ML IV SOLN
500.0000 [IU] | INTRAVENOUS | Status: AC | PRN
  Administered 2017-04-25: 500 [IU]
  Filled 2017-04-25: qty 5

## 2017-04-25 MED ORDER — FENTANYL 12 MCG/HR TD PT72: 37.0000 ug | MEDICATED_PATCH | TRANSDERMAL | 0 refills | Status: DC

## 2017-04-25 MED ORDER — MAGNESIUM OXIDE 400 (241.3 MG) MG PO TABS: 400.0000 mg | ORAL_TABLET | Freq: Two times a day (BID) | ORAL | 9 refills | Status: AC

## 2017-04-25 NOTE — Discharge Summary (Signed)
Physician Discharge Summary  Patient ID: [name] MRN: [id number] DOB/AGE: 66-May-1952 66 y.o.  Admit date: 04/17/2017 Discharge date: 04/25/2017  Primary Care Physician:  Eloise Levels, MD   Discharge Diagnoses:    Present on Admission: . 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 Hypomagnesemia . Mucositis due to antineoplastic therapy . Weight loss . Incisional pain   Discharge Medications:  Allergies as of 04/25/2017      Reactions   Latex Rash   Denies airway involvement   Adhesive [tape] Itching, Rash   Codeine Rash   shakes      Medication List    STOP taking these medications   dexamethasone 4 MG tablet Commonly known as:  DECADRON   fentaNYL 25 MCG/HR patch Commonly known as:  DURAGESIC - dosed mcg/hr Replaced by:  fentaNYL 12 MCG/HR   fluconazole 100 MG tablet Commonly known as:  DIFLUCAN     TAKE these medications   aspirin 81 MG chewable tablet Chew 81 mg by mouth every morning.   fentaNYL 12 MCG/HR Commonly known as:  DURAGESIC - dosed mcg/hr Place 3 patches (37.5 mcg total) onto the skin every 3 (three) days. Start taking on:  04/26/2017 Replaces:  fentaNYL 25 MCG/HR patch   lidocaine-prilocaine cream Commonly known as:  EMLA Apply to affected area once   magic mouthwash w/lidocaine Soln Take 5 mLs by mouth 4 (four) times daily as needed for mouth pain.   magnesium oxide 400 (241.3 Mg) MG tablet Commonly known as:  MAG-OX Take 1 tablet (400 mg total) by mouth 2 (two) times daily.   morphine 20 MG/ML concentrated solution Commonly known as:  ROXANOL Take 0.5 mLs (10 mg total) by mouth every 2 (two) hours as needed for severe pain.   ondansetron 8 MG tablet Commonly known as:  ZOFRAN Take 1 tablet (8 mg total) by mouth every 8 (eight) hours as needed. Start on the third day after chemotherapy.   prochlorperazine 10 MG tablet Commonly known as:  COMPAZINE Take 1 tablet (10 mg total) by mouth every 6 (six)  hours as needed (Nausea or vomiting).       Disposition and Follow-up:   Significant Diagnostic Studies:  Ct Abdomen Wo Contrast  Result Date: 04/19/2017 CLINICAL DATA:  Pain at gastrostomy tube site. EXAM: CT ABDOMEN WITHOUT CONTRAST TECHNIQUE: Multidetector CT imaging of the abdomen was performed following the standard protocol without IV contrast. COMPARISON:  CT scan 02/02/2014 FINDINGS: Lower chest: The lung bases are clear of acute process. Minimal dependent subpleural bibasilar atelectasis. No worrisome pulmonary lesions. Three-vessel coronary artery calcifications are noted. Hepatobiliary: A few small scattered hepatic cysts are noted. No worrisome hepatic lesions or intrahepatic biliary dilatation. The gallbladder is surgically absent. No common bile duct dilatation. Pancreas: No mass, inflammation or ductal dilatation. Small duodenum diverticulum noted near the pancreatic head. Spleen: Normal size.  No focal lesions. Adrenals/Urinary Tract: Stable left adrenal gland nodule consistent with benign adenoma. The right adrenal gland is normal. No renal mass or renal calculi. Stomach/Bowel: The stomach, duodenum, visualized small bowel and visualize colon are unremarkable. The gastrostomy tube is in good position. No complicating features are identified. Vascular/Lymphatic: Atherosclerotic calcifications involving the aorta and branch vessels. There is a small saccular aneurysm noted along the medial aspect of the proximal right common iliac artery measuring 18.5 x 12.5 mm. No mesenteric or retroperitoneal mass or adenopathy. Small scattered lymph nodes are noted. Other: No ascites or abdominal wall hernia. Evidence of prior hernia  repair with anterior abdominal wall mesh. Musculoskeletal: No significant bony findings. IMPRESSION: 1. The gastrostomy tube is in good position and I do not identify any complicating features associated with it. 2. No acute abdominal findings, mass lesions or adenopathy. 3.  Small left adrenal gland adenoma. 4. Advanced atherosclerotic calcifications involving the aorta and branch vessels. Three-vessel coronary artery calcifications are noted. 5. 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 If the Electronically Signed   By: Marijo Sanes M.D.   On: 04/19/2017 13:19    Discharge Laboratory Values: Lab Results  Component Value Date   WBC 3.3 (L) 04/25/2017   HGB 9.8 (L) 04/25/2017   HCT 28.7 (L) 04/25/2017   MCV 93.8 04/25/2017   PLT 185 04/25/2017   Lab Results  Component Value Date   NA 133 (L) 04/23/2017   K 3.5 04/23/2017   CL 97 (L) 04/23/2017   CO2 30 04/23/2017    Brief H and P: For complete details please refer to admission H and P, but in brief, he was admitted for severe, uncontrolled dehydration, diarrhea, weakness and dizziness  Physical Exam at Discharge: BP 130/77 (BP Location: Right Arm)   Pulse 80   Temp 98.5 F (36.9 C) (Axillary)   Resp 16   Ht 5\' 8"  (1.727 m)   Wt 159 lb (72.1 kg)   SpO2 96%   BMI 24.18 kg/m  GENERAL:alert, no distress and comfortable SKIN: He has significant ulcerated dermatitis around his neck from radiation treatment. EYES: normal, Conjunctiva are pink and non-injected, sclera clear OROPHARYNX: He has persistent mucositis.  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[facility])   Hypomagnesemia   Weight loss   Incisional pain   Mucositis due to  antineoplastic therapy   Hypokalemia   Neutropenia, drug-induced ([facility])  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[facility]) He has started to experience worsening side-effects Chemotherapy was stopped He completed radiation treatment on 04/25/2017  Weight loss He has progressive weight loss since surgery and chemo, exacerbated by fluid loss due to severe diarrhea He was able to resume full diet through feeding tube  Severe diarrhea, resolved Cause is unknown Stool culture negative  Hypomagnesemia, intermittent He has low magnesium likely due to chemo and loss from diarrhea He has received magnesium replacement therapy and will continue oral magnesium replacement  Severe constipation, resolved I will continue Senokot and daily MiraLAX  Near syncopal episode, dehydration, hyponatremia, resolved Likely due to profound dehydration Resolved with IV fluids  Severe mucositis due to antineoplastic therapy Mucositis is worse Candidiasis that resolved His pain is better control with addition of liquid morphine with fentanyl patch as needed Per patient request, will increase the dose of fentanyl patch. Continue liquid morphine as needed for breakthrough pain  Acquired pancytopenia Due to recent treatment He is complaining of fatigue. He received 2 units of blood transfusion on 04/22/2017  DVT prophylaxis On Lovenox  CODE STATUS Full code  Activity:  As tolerated  Condition at Discharge:   stable Spent 35 minutes on  discharge Signed: Dr. Heath Lark [phone]  04/25/2017, 7:59 AM

## 2017-04-25 NOTE — Progress Notes (Signed)
[name] Therapy Treatment Record Phone [phone]   Radiation Therapy was administered to [name] on: 04/25/2017  2:20 PM and was treatment # 30 out of a planned course of 30 treatments.  Radiation Treatment  1). Beam photons with 6-10 energy  2). Brachytherapy None  3). Stereotactic Radiosurgery None  4). Other Radiation None     Wendy Mikles A Mauri Temkin, RT (T)

## 2017-04-25 NOTE — Progress Notes (Signed)
Patient d/c home. Stable. IV deaccessed, unremarkable.

## 2017-04-25 NOTE — Progress Notes (Signed)
Discharge instructions provided, appointment were discussed and prescriptions given. All questions answered appropriately. Patient will have final radiation treatment this afternoon.

## 2017-04-25 NOTE — Progress Notes (Signed)
Oncology Nurse Navigator Documentation  Met with Mr. Gebel during final RT to offer support and to celebrate end of radiation treatment.  He was unaccompanied. I provided him with a Certificate of Recognition for his wife acknowledging her supportive care. I provided post-RT guidance:  Importance of keeping follow-up appts with Nutrition and SLP.  Importance of protecting treatment area from sun.  Continuation of Sonafine application 2-3 times daily. I explained that my role as navigator will continue for several more months and that I will be calling and/or joining him during follow-up visits.   I encouraged him to call me with needs/concerns.   He verbalized understanding of information provided.  Rick , RN, BSN, CHPN Head & Neck Oncology Navigator [facility] [place] [phone]  

## 2017-04-25 NOTE — Progress Notes (Signed)
Nutrition Brief Follow-up  Pt expected to discharge today. Pt in room, waiting to have radiation treatment. Pt states he feels better. Pt has not been administering 6 cans daily of Osmolite 1.5. States "I think I overdid it yesterday". States he was bolus feeding every 2 hours and felt full afterwards. Encouraged pt to spread feeds out to every 4 hours. He is sipping on water only PO.  Reminded patient to continue feeds as recommended PTA with 6 cans of Nutren 1.5 daily.   Clayton Bibles, MS, RD, LDN Pager: [phone] After Hours Pager: [phone]

## 2017-04-26 ENCOUNTER — Ambulatory Visit: Payer: No Typology Code available for payment source

## 2017-05-01 ENCOUNTER — Other Ambulatory Visit: Payer: Self-pay | Admitting: Hematology and Oncology

## 2017-05-01 ENCOUNTER — Encounter: Payer: Self-pay | Admitting: Hematology and Oncology

## 2017-05-01 ENCOUNTER — Ambulatory Visit (HOSPITAL_BASED_OUTPATIENT_CLINIC_OR_DEPARTMENT_OTHER): Payer: Medicare PPO

## 2017-05-01 ENCOUNTER — Other Ambulatory Visit: Payer: No Typology Code available for payment source

## 2017-05-01 ENCOUNTER — Other Ambulatory Visit (HOSPITAL_BASED_OUTPATIENT_CLINIC_OR_DEPARTMENT_OTHER): Payer: Medicare PPO

## 2017-05-01 ENCOUNTER — Ambulatory Visit (HOSPITAL_BASED_OUTPATIENT_CLINIC_OR_DEPARTMENT_OTHER): Payer: Medicare PPO | Admitting: Hematology and Oncology

## 2017-05-01 VITALS — BP 164/85 | HR 75 | Temp 97.5°F | Resp 18 | Ht 68.0 in | Wt 150.6 lb

## 2017-05-01 DIAGNOSIS — R634 Abnormal weight loss: Secondary | ICD-10-CM

## 2017-05-01 DIAGNOSIS — C021 Malignant neoplasm of border of tongue: Secondary | ICD-10-CM | POA: Diagnosis not present

## 2017-05-01 DIAGNOSIS — E876 Hypokalemia: Secondary | ICD-10-CM

## 2017-05-01 DIAGNOSIS — K1231 Oral mucositis (ulcerative) due to antineoplastic therapy: Secondary | ICD-10-CM

## 2017-05-01 DIAGNOSIS — Z95828 Presence of other vascular implants and grafts: Secondary | ICD-10-CM

## 2017-05-01 LAB — COMPREHENSIVE METABOLIC PANEL
ALBUMIN: 3 g/dL — AB (ref 3.5–5.0)
ALK PHOS: 62 U/L (ref 40–150)
ALT: 19 U/L (ref 0–55)
ANION GAP: 14 meq/L — AB (ref 3–11)
AST: 31 U/L (ref 5–34)
BUN: 19.3 mg/dL (ref 7.0–26.0)
CALCIUM: 9 mg/dL (ref 8.4–10.4)
CO2: 36 mEq/L — ABNORMAL HIGH (ref 22–29)
CREATININE: 0.7 mg/dL (ref 0.7–1.3)
Chloride: 87 mEq/L — ABNORMAL LOW (ref 98–109)
EGFR: >90 mL/min/{1.73_m2} (ref >90)
Glucose: 94 mg/dl (ref 70–140)
POTASSIUM: 2.9 meq/L — AB (ref 3.5–5.1)
Sodium: 136 mEq/L (ref 136–145)
Total Bilirubin: 0.36 mg/dL (ref 0.20–1.20)
Total Protein: 7.1 g/dL (ref 6.4–8.3)

## 2017-05-01 LAB — CBC WITH DIFFERENTIAL/PLATELET
BASO%: 0.4 % (ref 0.0–2.0)
Basophils Absolute: 0 10*3/uL (ref 0.0–0.1)
EOS%: 1.2 % (ref 0.0–7.0)
Eosinophils Absolute: 0.1 10*3/uL (ref 0.0–0.5)
HCT: 34.8 % — ABNORMAL LOW (ref 38.4–49.9)
HGB: 11.6 g/dL — ABNORMAL LOW (ref 13.0–17.1)
LYMPH%: 10.2 % — AB (ref 14.0–49.0)
MCH: 31.6 pg (ref 27.2–33.4)
MCHC: 33.3 g/dL (ref 32.0–36.0)
MCV: 94.8 fL (ref 79.3–98.0)
MONO#: 0.9 10*3/uL (ref 0.1–0.9)
MONO%: 11.8 % (ref 0.0–14.0)
NEUT#: 5.7 10*3/uL (ref 1.5–6.5)
NEUT%: 76.4 % — ABNORMAL HIGH (ref 39.0–75.0)
Platelets: 325 10*3/uL (ref 140–400)
RBC: 3.67 10*6/uL — AB (ref 4.20–5.82)
RDW: 14.8 % — ABNORMAL HIGH (ref 11.0–14.6)
WBC: 7.5 10*3/uL (ref 4.0–10.3)
lymph#: 0.8 10*3/uL — ABNORMAL LOW (ref 0.9–3.3)
nRBC: 0 % (ref 0–0)

## 2017-05-01 LAB — MAGNESIUM: MAGNESIUM: 1.4 mg/dL — AB (ref 1.5–2.5)

## 2017-05-01 MED ORDER — MAGNESIUM SULFATE 2 GM/50ML IV SOLN
2.0000 g | Freq: Once | INTRAVENOUS | Status: AC
  Administered 2017-05-01: 2 g via INTRAVENOUS
  Filled 2017-05-01: qty 50

## 2017-05-01 MED ORDER — SCOPOLAMINE 1 MG/3DAYS TD PT72: 1.0000 | MEDICATED_PATCH | TRANSDERMAL | 12 refills | Status: DC

## 2017-05-01 MED ORDER — SODIUM CHLORIDE 0.9 % IV SOLN
Freq: Once | INTRAVENOUS | Status: AC
  Administered 2017-05-01: 11:00:00 via INTRAVENOUS

## 2017-05-01 MED ORDER — SODIUM CHLORIDE 0.9% FLUSH
10.0000 mL | INTRAVENOUS | Status: DC | PRN
  Administered 2017-05-01: 10 mL
  Filled 2017-05-01: qty 10

## 2017-05-01 MED ORDER — SODIUM CHLORIDE 0.9 % IV SOLN
Freq: Once | INTRAVENOUS | Status: AC
  Administered 2017-05-01: 13:00:00 via INTRAVENOUS
  Filled 2017-05-01: qty 10

## 2017-05-01 MED ORDER — HEPARIN SOD (PORK) LOCK FLUSH 100 UNIT/ML IV SOLN
500.0000 [IU] | Freq: Once | INTRAVENOUS | Status: AC | PRN
  Administered 2017-05-01: 500 [IU]
  Filled 2017-05-01: qty 5

## 2017-05-01 NOTE — Patient Instructions (Signed)
Dehydration, Adult Dehydration is a condition in which there is not enough fluid or water in the body. This happens when you lose more fluids than you take in. Important organs, such as the kidneys, brain, and heart, cannot function without a proper amount of fluids. Any loss of fluids from the body can lead to dehydration. Dehydration can range from mild to severe. This condition should be treated right away to prevent it from becoming severe. What are the causes? This condition may be caused by:  Vomiting.  Diarrhea.  Excessive sweating, such as from heat exposure or exercise.  Not drinking enough fluid, especially:  When ill.  While doing activity that requires a lot of energy.  Excessive urination.  Fever.  Infection.  Certain medicines, such as medicines that cause the body to lose excess fluid (diuretics).  Inability to access safe drinking water.  Reduced physical ability to get adequate water and food. What increases the risk? This condition is more likely to develop in people:  Who have a poorly controlled long-term (chronic) illness, such as diabetes, heart disease, or kidney disease.  Who are age 65 or older.  Who are disabled.  Who live in a place with high altitude.  Who play endurance sports. What are the signs or symptoms? Symptoms of mild dehydration may include:   Thirst.  Dry lips.  Slightly dry mouth.  Dry, warm skin.  Dizziness. Symptoms of moderate dehydration may include:   Very dry mouth.  Muscle cramps.  Dark urine. Urine may be the color of tea.  Decreased urine production.  Decreased tear production.  Heartbeat that is irregular or faster than normal (palpitations).  Headache.  Light-headedness, especially when you stand up from a sitting position.  Fainting (syncope). Symptoms of severe dehydration may include:   Changes in skin, such as:  Cold and clammy skin.  Blotchy (mottled) or pale skin.  Skin that does  not quickly return to normal after being lightly pinched and released (poor skin turgor).  Changes in body fluids, such as:  Extreme thirst.  No tear production.  Inability to sweat when body temperature is high, such as in hot weather.  Very little urine production.  Changes in vital signs, such as:  Weak pulse.  Pulse that is more than 100 beats a minute when sitting still.  Rapid breathing.  Low blood pressure.  Other changes, such as:  Sunken eyes.  Cold hands and feet.  Confusion.  Lack of energy (lethargy).  Difficulty waking up from sleep.  Short-term weight loss.  Unconsciousness. How is this diagnosed? This condition is diagnosed based on your symptoms and a physical exam. Blood and urine tests may be done to help confirm the diagnosis. How is this treated? Treatment for this condition depends on the severity. Mild or moderate dehydration can often be treated at home. Treatment should be started right away. Do not wait until dehydration becomes severe. Severe dehydration is an emergency and it needs to be treated in a hospital. Treatment for mild dehydration may include:   Drinking more fluids.  Replacing salts and minerals in your blood (electrolytes) that you may have lost. Treatment for moderate dehydration may include:   Drinking an oral rehydration solution (ORS). This is a drink that helps you replace fluids and electrolytes (rehydrate). It can be found at pharmacies and retail stores. Treatment for severe dehydration may include:   Receiving fluids through an IV tube.  Receiving an electrolyte solution through a feeding tube that is   passed through your nose and into your stomach (nasogastric tube, or NG tube).  Correcting any abnormalities in electrolytes.  Treating the underlying cause of dehydration. Follow these instructions at home:  If directed by your health care provider, drink an ORS:  Make an ORS by following instructions on the  package.  Start by drinking small amounts, about  cup (120 mL) every 5-10 minutes.  Slowly increase how much you drink until you have taken the amount recommended by your health care provider.  Drink enough clear fluid to keep your urine clear or pale yellow. If you were told to drink an ORS, finish the ORS first, then start slowly drinking other clear fluids. Drink fluids such as:  Water. Do not drink only water. Doing that can lead to having too little salt (sodium) in the body (hyponatremia).  Ice chips.  Fruit juice that you have added water to (diluted fruit juice).  Low-calorie sports drinks.  Avoid:  Alcohol.  Drinks that contain a lot of sugar. These include high-calorie sports drinks, fruit juice that is not diluted, and soda.  Caffeine.  Foods that are greasy or contain a lot of fat or sugar.  Take over-the-counter and prescription medicines only as told by your health care provider.  Do not take sodium tablets. This can lead to having too much sodium in the body (hypernatremia).  Eat foods that contain a healthy balance of electrolytes, such as bananas, oranges, potatoes, tomatoes, and spinach.  Keep all follow-up visits as told by your health care provider. This is important. Contact a health care provider if:  You have abdominal pain that:  Gets worse.  Stays in one area (localizes).  You have a rash.  You have a stiff neck.  You are more irritable than usual.  You are sleepier or more difficult to wake up than usual.  You feel weak or dizzy.  You feel very thirsty.  You have urinated only a small amount of very dark urine over 6-8 hours. Get help right away if:  You have symptoms of severe dehydration.  You cannot drink fluids without vomiting.  Your symptoms get worse with treatment.  You have a fever.  You have a severe headache.  You have vomiting or diarrhea that:  Gets worse.  Does not go away.  You have blood or green matter  (bile) in your vomit.  You have blood in your stool. This may cause stool to look black and tarry.  You have not urinated in 6-8 hours.  You faint.  Your heart rate while sitting still is over 100 beats a minute.  You have trouble breathing. This information is not intended to replace advice given to you by your health care provider. Make sure you discuss any questions you have with your health care provider. Document Released: 11/19/2005 Document Revised: 06/15/2016 Document Reviewed: 01/13/2016 Elsevier Interactive Patient Education  2017 [employer]   Hypomagnesemia Hypomagnesemia is a condition in which the level of magnesium in the blood is low. Magnesium is a mineral that is found in many foods. It is used in many different processes in the body. Hypomagnesemia can affect every organ in the body. It can cause life-threatening problems. What are the causes? Causes of hypomagnesemia include:  Not getting enough magnesium in your diet.  Malnutrition.  Problems with absorbing magnesium from the intestines.  Dehydration.  Alcohol abuse.  Vomiting.  Severe diarrhea.  Some medicines, including medicines that make you urinate more.  Certain diseases, such  as kidney disease, diabetes, and overactive thyroid. What are the signs or symptoms?  Involuntary shaking or trembling of a body part (tremor).  Confusion.  Muscle weakness.  Sensitivity to light, sound, and touch.  Psychiatric issues, such as depression, irritability, or psychosis.  Sudden tightening of muscles (muscle spasms).  Tingling in the arms and legs.  A feeling of fluttering of the heart. These symptoms are more severe if magnesium levels drop suddenly. How is this diagnosed? To make a diagnosis, your health care provider will do a physical exam and order blood and urine tests. How is this treated? Treatment will depend on the cause and the severity of your condition. It may involve:  A  magnesium supplement. This can be taken in pill form. It can also be given through an IV tube. This is usually done if the condition is severe.  Changes to your diet. You may be directed to eat foods that have a lot of magnesium, such as green leafy vegetables, peas, beans, and nuts.  Eliminating alcohol from your diet. Follow these instructions at home:  Include foods with magnesium in your diet. Foods that are rich in magnesium include green vegetables, beans, nuts and seeds, and whole grains.  Take medicines only as directed by your health care provider.  Take magnesium supplements if your health care provider instructs you to do that. Take them as directed.  Have your magnesium levels monitored as directed by your health care provider.  When you are active, drink fluids that contain electrolytes.  Keep all follow-up visits as directed by your health care provider. This is important. Contact a health care provider if:  You get worse instead of better.  Your symptoms return. Get help right away if:  Your symptoms are severe. This information is not intended to replace advice given to you by your health care provider. Make sure you discuss any questions you have with your health care provider. Document Released: 08/15/2005 Document Revised: 04/26/2016 Document Reviewed: 07/05/2014 Elsevier Interactive Patient Education  2017 [employer].    Hypokalemia Hypokalemia means that the amount of potassium in the blood is lower than normal.Potassium is a chemical that helps regulate the amount of fluid in the body (electrolyte). It also stimulates muscle tightening (contraction) and helps nerves work properly.Normally, most of the body's potassium is inside of cells, and only a very small amount is in the blood. Because the amount in the blood is so small, minor changes to potassium levels in the blood can be life-threatening. What are the causes? This condition may be caused  by:  Antibiotic medicine.  Diarrhea or vomiting. Taking too much of a medicine that helps you have a bowel movement (laxative) can cause diarrhea and lead to hypokalemia.  Chronic kidney disease (CKD).  Medicines that help the body get rid of excess fluid (diuretics).  Eating disorders, such as bulimia.  Low magnesium levels in the body.  Sweating a lot. What are the signs or symptoms? Symptoms of this condition include:  Weakness.  Constipation.  Fatigue.  Muscle cramps.  Mental confusion.  Skipped heartbeats or irregular heartbeat (palpitations).  Tingling or numbness. How is this diagnosed? This condition is diagnosed with a blood test. How is this treated? Hypokalemia can be treated by taking potassium supplements by mouth or adjusting the medicines that you take. Treatment may also include eating more foods that contain a lot of potassium. If your potassium level is very low, you may need to get potassium through an  IV tube in one of your veins and be monitored in the hospital. Follow these instructions at home:  Take over-the-counter and prescription medicines only as told by your health care provider. This includes vitamins and supplements.  Eat a healthy diet. A healthy diet includes fresh fruits and vegetables, whole grains, healthy fats, and lean proteins.  If instructed, eat more foods that contain a lot of potassium, such as:  Nuts, such as peanuts and pistachios.  Seeds, such as sunflower seeds and pumpkin seeds.  Peas, lentils, and lima beans.  Whole grain and bran cereals and breads.  Fresh fruits and vegetables, such as apricots, avocado, bananas, cantaloupe, kiwi, oranges, tomatoes, asparagus, and potatoes.  Orange juice.  Tomato juice.  Red meats.  Yogurt.  Keep all follow-up visits as told by your health care provider. This is important. Contact a health care provider if:  You have weakness that gets worse.  You feel your heart  pounding or racing.  You vomit.  You have diarrhea.  You have diabetes (diabetes mellitus) and you have trouble keeping your blood sugar (glucose) in your target range. Get help right away if:  You have chest pain.  You have shortness of breath.  You have vomiting or diarrhea that lasts for more than 2 days.  You faint. This information is not intended to replace advice given to you by your health care provider. Make sure you discuss any questions you have with your health care provider. Document Released: 11/19/2005 Document Revised: 07/07/2016 Document Reviewed: 07/07/2016 Elsevier Interactive Patient Education  2017 [employer].

## 2017-05-01 NOTE — Assessment & Plan Note (Signed)
He has persistent mucositis pain His pain is well controlled but he has difficulties with eating, drinking and has persistent nausea He will continue current dose prescription fentanyl patch and I will reassess pain control next week

## 2017-05-01 NOTE — Progress Notes (Signed)
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 This caused him significant discomfort, resulting in decreased food intake.      12/28/2016 Imaging    CT of the neck on 12/28/16 had demonstrate no frank adenopathy in the neck and the tongue was not well visualized die to dental work. On the same day a CT of his chest showed emphysema of the lungs and a few nonspecific pulmonary nodules which were unchanged compared to 06/2015.      01/15/2017 PET scan    This revealed hypermetabolic activity in the left anterior tongue as well as within a small left level 2 node and small nodules in the left parotid gland. No evidence of metastatic disease distantly      01/28/2017 Pathology Results    A.TONGUE, LEFT PARTIAL GLOSSECTOMY: Invasive moderate to poorly-differentiated squamous cell carcinoma, conventional type, 2.5 cm in greatest dimension on gross examination. Invasive squamous cell carcinoma shows greatest depth of invasion of 1.5 cm. Margins uninvolved by invasive tumor (invasive squamous cell carcinoma comes to within 0.6 cm of the inked posterior margin). Negative for lymphovascular invasion Negative for perineural invasion. AJCC Pathologic Stage: pT3 pN3 pM- not applicable. See Cancer Case Summary.  SURGICAL PATHOLOGY CANCER CASE SUMMARY- LIP AND ORAL CAVITY Protocol posting date: June 2017 Procedure: Partial glossectomy Tumor site: Oral, tongue Tumor laterality: left Tumor focality: unifocal Tumor size: Greatest dimension 2.5 cm on gross examination Histologic type: Squamous cell carcinoma, conventional Histologic grade: G2-G3, moderate to poorly differentiated Specimen margins:  Uninvolved by invasive tumor Distance from closest margin: 6 mm (0.6 cm) from posterior margin Lymphovascular invasion: Not identified Perineural invasion: Not identified Regional lymph nodes: Number of lymph nodes involved: 3 Number of lymph nodes examined: 43 Laterality of lymph nodes involved: Bilateral Size of largest metastatic deposit: 9 mm (0.9 cm) Extranodal extension: very focally present in 1 lymph node (part J) Pathologic Stage Classification (pTNM, AJCC 8th Edition) Primary tumor (pT): pT3 Regional lymph nodes (pN): pN3 Distant metastasis (pM): not applicable   B.ANTERIOR DORSAL TONGUE, BIOPSY: Negative for malignancy.  C.POSTERIOR DORSAL TONGUE, BIOPSY: Negative for malignancy.  D.MID DORSAL TONGUE, BIOPSY: Negative for malignancy.  E.DEEP TONGUE, BIOPSY: Negative for malignancy.  F.ANTERIOR FLOOR OF MOUTH, BIOPSY: Negative for malignancy.  G.POSTERIOR FLOOR OF MOUTH, BIOPSY: Negative for malignancy.  H.LEVEL IA, RESECTION: Six benign lymph nodes negative for metastatic carcinoma on routine H&E staining (0/6).  I.LEVEL IB, RESECTION: Two benign lymph nodes negative for metastatic carcinoma on routine H&E staining (0/2). Benign salivary gland tissue.  J.LEFT LEVEL IIA , 3 AND 4, RESECTION: One of twenty-one lymph nodes positive for metastatic carcinoma (0.9 cm focus) with very focal extranodal extension on routine H&E staining (1/21).  K.LEVEL IIB, RESECTION: Four benign lymph nodes negative for metastatic carcinoma on routine H&E staining (0/4). One small focus of benign salivary gland tissue.  L.RIGHT LEVEL IB, RESECTION: Three benign lymph nodes negative for  metastatic carcinoma on routine H&E staining (0/3). Benign salivary gland tissue.  M.RIGHT LEVEL 2A, 3, 4, RESECTION: Two of fifteen lymph nodes positive for metastatic carcinoma (2/15). See comment.  Comment: A small focus suspicious for metastatic carcinoma is seen on the initial H&E stained slide. Therefore, a pankeratin immunohistochemical stain  is performed. The positive control stain functioned appropriately. The pankeratin immunostain highlights a few minute foci of metastatic carcinoma in 2 of the 3 lymph nodes (largest focus 0.2 mm [0.02 cm]).      01/28/2017 Surgery    He had surgery at [facility] <100 SQCM [15100] (SPLIT THICKNESS SKIN GRAFT LEG)           03/01/2017 Procedure    Successful fluoroscopic guided percutaneous gastrostomy tube placement.      03/01/2017 Procedure    Placement of a subcutaneous port device. Catheter tip at the superior cavoatrial junction and ready to be used.      03/12/2017 - 04/25/2017 Radiation Therapy    He received radiation treatment       03/13/2017 Procedure    Baseline hearing is near normal      03/15/2017 - 04/12/2017 Chemotherapy    He received cisplatin x 5 weekly doses      04/17/2017 - 04/25/2017 Hospital Admission    He was admitted to the hospital for management of severe mucositis pain, dehydration, dizziness and uncontrolled nausea       INTERVAL HISTORY: Please see below for problem oriented charting. He is seen today as part of his hospital follow-up He has not been doing well since discharge from the hospital He continues to have mucus production, gagging, nausea and vomiting and difficulties tolerating nutritional supplement He is only able to tolerate about 4 cans of nutritional supplement per day He denies constipation His throat pain is well controlled He complained of weakness and  have lost some weight  REVIEW OF SYSTEMS:   Constitutional: Denies fevers, chills  Eyes: Denies blurriness of vision Respiratory: Denies cough, dyspnea or wheezes Cardiovascular: Denies palpitation, chest discomfort or lower extremity swelling Lymphatics: Denies new lymphadenopathy or easy bruising Neurological:Denies numbness, tingling or new weaknesses Behavioral/Psych: Mood is stable, no new changes  All other systems were reviewed with the patient and are negative.  I have reviewed the past medical history, past surgical history, social history and family history with the patient and they are unchanged from previous note.  ALLERGIES:  is allergic to latex; adhesive [tape]; and codeine.  MEDICATIONS:  Current Outpatient Prescriptions  Medication Sig Dispense Refill  . aspirin 81 MG chewable tablet Chew 81 mg by mouth every morning.     Marland Kitchen dexamethasone (DECADRON) 4 MG tablet     . fentaNYL (DURAGESIC - DOSED MCG/HR) 12 MCG/HR Place 3 patches (37.5 mcg total) onto the skin every 3 (three) days. 10 patch 0  . lidocaine-prilocaine (EMLA) cream Apply to affected area once 30 g 3  . magnesium oxide (MAG-OX) 400 (241.3 Mg) MG tablet Take 1 tablet (400 mg total) by mouth 2 (two) times daily. 60 tablet 9  . morphine (ROXANOL) 20 MG/ML concentrated solution Take 0.5 mLs (10 mg total) by mouth every 2 (two) hours as needed for severe pain. 120 mL 0  . magic mouthwash w/lidocaine SOLN Take 5 mLs by mouth 4 (four) times daily as needed for mouth pain. (Patient not taking: Reported on 03/28/2017) 240 mL 0  . ondansetron (ZOFRAN) 8 MG tablet Take 1 tablet (8 mg total) by mouth every 8 (eight) hours as needed. Start on the third day after chemotherapy. (Patient not taking: Reported on 03/21/2017) 30 tablet 1  . prochlorperazine (COMPAZINE) 10 MG tablet Take 1 tablet (10 mg total) by mouth every 6 (six) hours as  needed (Nausea or vomiting). (Patient not taking: Reported on 03/21/2017) 30 tablet 1  .  scopolamine (TRANSDERM-SCOP) 1 MG/3DAYS Place 1 patch (1.5 mg total) onto the skin every 3 (three) days. 10 patch 12   Current Facility-Administered Medications  Medication Dose Route Frequency Provider Last Rate Last Dose  . sodium chloride flush (NS) 0.9 % injection 10 mL  10 mL Intracatheter PRN Alvy Bimler, Narek Kniss, MD   10 mL at 05/01/17 1611    PHYSICAL EXAMINATION: ECOG PERFORMANCE STATUS: 1 - Symptomatic but completely ambulatory  Vitals:   05/01/17 1043  BP: (!) 164/85  Pulse: 75  Resp: 18  Temp: 97.5 F (36.4 C)   Filed Weights   05/01/17 1043  Weight: 150 lb 9.6 oz (68.3 kg)    GENERAL:alert, no distress and comfortable SKIN: Noted persistent dermatitis around his neck but not much improved compared to previous visit EYES: normal, Conjunctiva are pink and non-injected, sclera clear OROPHARYNX: Noted ulcerated mucositis.  No thrush.  His lips are dry NECK: supple, thyroid normal size, non-tender, without nodularity LYMPH:  no palpable lymphadenopathy in the cervical, axillary or inguinal LUNGS: clear to auscultation and percussion with normal breathing effort HEART: regular rate & rhythm and no murmurs and no lower extremity edema ABDOMEN:abdomen soft, non-tender and normal bowel sounds Musculoskeletal:no cyanosis of digits and no clubbing  NEURO: alert & oriented x 3 with fluent speech, no focal motor/sensory deficits  LABORATORY DATA:  I have reviewed the data as listed    Component Value Date/Time   NA 136 05/01/2017 1030   K 2.9 (LL) 05/01/2017 1030   CL 97 (L) 04/23/2017 0430   CO2 36 (H) 05/01/2017 1030   GLUCOSE 94 05/01/2017 1030   BUN 19.3 05/01/2017 1030   CREATININE 0.7 05/01/2017 1030   CALCIUM 9.0 05/01/2017 1030   PROT 7.1 05/01/2017 1030   ALBUMIN 3.0 (L) 05/01/2017 1030   AST 31 05/01/2017 1030   ALT 19 05/01/2017 1030   ALKPHOS 62 05/01/2017 1030   BILITOT 0.36 05/01/2017 1030   GFRNONAA >60 04/23/2017 0430   GFRAA >60 04/23/2017 0430    No  results found for: SPEP, UPEP  Lab Results  Component Value Date   WBC 7.5 05/01/2017   NEUTROABS 5.7 05/01/2017   HGB 11.6 (L) 05/01/2017   HCT 34.8 (L) 05/01/2017   MCV 94.8 05/01/2017   PLT 325 05/01/2017      Chemistry      Component Value Date/Time   NA 136 05/01/2017 1030   K 2.9 (LL) 05/01/2017 1030   CL 97 (L) 04/23/2017 0430   CO2 36 (H) 05/01/2017 1030   BUN 19.3 05/01/2017 1030   CREATININE 0.7 05/01/2017 1030      Component Value Date/Time   CALCIUM 9.0 05/01/2017 1030   ALKPHOS 62 05/01/2017 1030   AST 31 05/01/2017 1030   ALT 19 05/01/2017 1030   BILITOT 0.36 05/01/2017 1030       RADIOGRAPHIC STUDIES: I have personally reviewed the radiological images as listed and agreed with the findings in the report. Ct Abdomen Wo Contrast  Result Date: 04/19/2017 CLINICAL DATA:  Pain at gastrostomy tube site. EXAM: CT ABDOMEN WITHOUT CONTRAST TECHNIQUE: Multidetector CT imaging of the abdomen was performed following the standard protocol without IV contrast. COMPARISON:  CT scan 02/02/2014 FINDINGS: Lower chest: The lung bases are clear of acute process. Minimal dependent subpleural bibasilar atelectasis. No worrisome pulmonary lesions. Three-vessel coronary artery calcifications are noted. Hepatobiliary: A few small scattered hepatic cysts  are noted. No worrisome hepatic lesions or intrahepatic biliary dilatation. The gallbladder is surgically absent. No common bile duct dilatation. Pancreas: No mass, inflammation or ductal dilatation. Small duodenum diverticulum noted near the pancreatic head. Spleen: Normal size.  No focal lesions. Adrenals/Urinary Tract: Stable left adrenal gland nodule consistent with benign adenoma. The right adrenal gland is normal. No renal mass or renal calculi. Stomach/Bowel: The stomach, duodenum, visualized small bowel and visualize colon are unremarkable. The gastrostomy tube is in good position. No complicating features are identified.  Vascular/Lymphatic: Atherosclerotic calcifications involving the aorta and branch vessels. There is a small saccular aneurysm noted along the medial aspect of the proximal right common iliac artery measuring 18.5 x 12.5 mm. No mesenteric or retroperitoneal mass or adenopathy. Small scattered lymph nodes are noted. Other: No ascites or abdominal wall hernia. Evidence of prior hernia repair with anterior abdominal wall mesh. Musculoskeletal: No significant bony findings. IMPRESSION: 1. The gastrostomy tube is in good position and I do not identify any complicating features associated with it. 2. No acute abdominal findings, mass lesions or adenopathy. 3. Small left adrenal gland adenoma. 4. Advanced atherosclerotic calcifications involving the aorta and branch vessels. Three-vessel coronary artery calcifications are noted. 5. 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 If the Electronically Signed   By: Marijo Sanes M.D.   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) He has recently completed treatment but has persistent side effects I will resume IV fluids daily for the next 2 weeks I will see him on a weekly basis for supportive care  Mucositis due to antineoplastic therapy He has persistent mucositis pain His pain is well controlled but he has difficulties with eating, drinking and has persistent nausea He will continue current dose prescription fentanyl patch and I will reassess pain control next week  Weight loss He has progressive weight loss since treatment is completed We discussed the importance of increasing nutritional supplement as tolerated He will continue close follow-up with dietitian  Hypomagnesemia He has low magnesium likely due to chemo I recommend magnesium replacement therapy IV and through PEG tube  Hypokalemia He has severe hypokalemia secondary to severe hypomagnesemia I will replace  with supplemental magnesium and potassium replacement therapy   Orders Placed This Encounter  Procedures  . SCHEDULING COMMUNICATION    Please schedule 2 hour infusion visit for fluids and hydration   All questions were answered. The patient knows to call the clinic with any problems, questions or concerns. No barriers to learning was detected. I spent 25 minutes counseling the patient face to face. The total time spent in the appointment was 30 minutes and more than 50% was on counseling and review of test results     Heath Lark, MD 05/01/2017 4:53 PM

## 2017-05-01 NOTE — Assessment & Plan Note (Signed)
He has progressive weight loss since treatment is completed We discussed the importance of increasing nutritional supplement as tolerated He will continue close follow-up with dietitian

## 2017-05-01 NOTE — Assessment & Plan Note (Signed)
He has low magnesium likely due to chemo I recommend magnesium replacement therapy IV and through PEG tube

## 2017-05-01 NOTE — Assessment & Plan Note (Signed)
He has severe hypokalemia secondary to severe hypomagnesemia I will replace with supplemental magnesium and potassium replacement therapy

## 2017-05-01 NOTE — Assessment & Plan Note (Signed)
He has recently completed treatment but has persistent side effects I will resume IV fluids daily for the next 2 weeks I will see him on a weekly basis for supportive care

## 2017-05-02 ENCOUNTER — Ambulatory Visit (HOSPITAL_BASED_OUTPATIENT_CLINIC_OR_DEPARTMENT_OTHER): Payer: Medicare PPO

## 2017-05-02 ENCOUNTER — Telehealth: Payer: Self-pay | Admitting: Hematology and Oncology

## 2017-05-02 VITALS — BP 151/93 | HR 84 | Temp 98.0°F | Resp 18 | Ht 68.0 in | Wt 144.2 lb

## 2017-05-02 DIAGNOSIS — C021 Malignant neoplasm of border of tongue: Secondary | ICD-10-CM

## 2017-05-02 DIAGNOSIS — E860 Dehydration: Secondary | ICD-10-CM

## 2017-05-02 DIAGNOSIS — Z95828 Presence of other vascular implants and grafts: Secondary | ICD-10-CM

## 2017-05-02 MED ORDER — SODIUM CHLORIDE 0.9 % IV SOLN
Freq: Once | INTRAVENOUS | Status: AC
  Administered 2017-05-02: 09:00:00 via INTRAVENOUS

## 2017-05-02 MED ORDER — HEPARIN SOD (PORK) LOCK FLUSH 100 UNIT/ML IV SOLN
500.0000 [IU] | Freq: Once | INTRAVENOUS | Status: AC | PRN
  Administered 2017-05-02: 500 [IU]
  Filled 2017-05-02: qty 5

## 2017-05-02 MED ORDER — SODIUM CHLORIDE 0.9% FLUSH
10.0000 mL | INTRAVENOUS | Status: DC | PRN
  Administered 2017-05-02: 10 mL
  Filled 2017-05-02: qty 10

## 2017-05-02 NOTE — Patient Instructions (Signed)
Dehydration, Adult Dehydration is a condition in which there is not enough fluid or water in the body. This happens when you lose more fluids than you take in. Important organs, such as the kidneys, brain, and heart, cannot function without a proper amount of fluids. Any loss of fluids from the body can lead to dehydration. Dehydration can range from mild to severe. This condition should be treated right away to prevent it from becoming severe. What are the causes? This condition may be caused by:  Vomiting.  Diarrhea.  Excessive sweating, such as from heat exposure or exercise.  Not drinking enough fluid, especially: ? When ill. ? While doing activity that requires a lot of energy.  Excessive urination.  Fever.  Infection.  Certain medicines, such as medicines that cause the body to lose excess fluid (diuretics).  Inability to access safe drinking water.  Reduced physical ability to get adequate water and food.  What increases the risk? This condition is more likely to develop in people:  Who have a poorly controlled long-term (chronic) illness, such as diabetes, heart disease, or kidney disease.  Who are age 65 or older.  Who are disabled.  Who live in a place with high altitude.  Who play endurance sports.  What are the signs or symptoms? Symptoms of mild dehydration may include:  Thirst.  Dry lips.  Slightly dry mouth.  Dry, warm skin.  Dizziness. Symptoms of moderate dehydration may include:  Very dry mouth.  Muscle cramps.  Dark urine. Urine may be the color of tea.  Decreased urine production.  Decreased tear production.  Heartbeat that is irregular or faster than normal (palpitations).  Headache.  Light-headedness, especially when you stand up from a sitting position.  Fainting (syncope). Symptoms of severe dehydration may include:  Changes in skin, such as: ? Cold and clammy skin. ? Blotchy (mottled) or pale skin. ? Skin that does  not quickly return to normal after being lightly pinched and released (poor skin turgor).  Changes in body fluids, such as: ? Extreme thirst. ? No tear production. ? Inability to sweat when body temperature is high, such as in hot weather. ? Very little urine production.  Changes in vital signs, such as: ? Weak pulse. ? Pulse that is more than 100 beats a minute when sitting still. ? Rapid breathing. ? Low blood pressure.  Other changes, such as: ? Sunken eyes. ? Cold hands and feet. ? Confusion. ? Lack of energy (lethargy). ? Difficulty waking up from sleep. ? Short-term weight loss. ? Unconsciousness. How is this diagnosed? This condition is diagnosed based on your symptoms and a physical exam. Blood and urine tests may be done to help confirm the diagnosis. How is this treated? Treatment for this condition depends on the severity. Mild or moderate dehydration can often be treated at home. Treatment should be started right away. Do not wait until dehydration becomes severe. Severe dehydration is an emergency and it needs to be treated in a hospital. Treatment for mild dehydration may include:  Drinking more fluids.  Replacing salts and minerals in your blood (electrolytes) that you may have lost. Treatment for moderate dehydration may include:  Drinking an oral rehydration solution (ORS). This is a drink that helps you replace fluids and electrolytes (rehydrate). It can be found at pharmacies and retail stores. Treatment for severe dehydration may include:  Receiving fluids through an IV tube.  Receiving an electrolyte solution through a feeding tube that is passed through your nose   and into your stomach (nasogastric tube, or NG tube).  Correcting any abnormalities in electrolytes.  Treating the underlying cause of dehydration. Follow these instructions at home:  If directed by your health care provider, drink an ORS: ? Make an ORS by following instructions on the  package. ? Start by drinking small amounts, about  cup (120 mL) every 5-10 minutes. ? Slowly increase how much you drink until you have taken the amount recommended by your health care provider.  Drink enough clear fluid to keep your urine clear or pale yellow. If you were told to drink an ORS, finish the ORS first, then start slowly drinking other clear fluids. Drink fluids such as: ? Water. Do not drink only water. Doing that can lead to having too little salt (sodium) in the body (hyponatremia). ? Ice chips. ? Fruit juice that you have added water to (diluted fruit juice). ? Low-calorie sports drinks.  Avoid: ? Alcohol. ? Drinks that contain a lot of sugar. These include high-calorie sports drinks, fruit juice that is not diluted, and soda. ? Caffeine. ? Foods that are greasy or contain a lot of fat or sugar.  Take over-the-counter and prescription medicines only as told by your health care provider.  Do not take sodium tablets. This can lead to having too much sodium in the body (hypernatremia).  Eat foods that contain a healthy balance of electrolytes, such as bananas, oranges, potatoes, tomatoes, and spinach.  Keep all follow-up visits as told by your health care provider. This is important. Contact a health care provider if:  You have abdominal pain that: ? Gets worse. ? Stays in one area (localizes).  You have a rash.  You have a stiff neck.  You are more irritable than usual.  You are sleepier or more difficult to wake up than usual.  You feel weak or dizzy.  You feel very thirsty.  You have urinated only a small amount of very dark urine over 6-8 hours. Get help right away if:  You have symptoms of severe dehydration.  You cannot drink fluids without vomiting.  Your symptoms get worse with treatment.  You have a fever.  You have a severe headache.  You have vomiting or diarrhea that: ? Gets worse. ? Does not go away.  You have blood or green matter  (bile) in your vomit.  You have blood in your stool. This may cause stool to look black and tarry.  You have not urinated in 6-8 hours.  You faint.  Your heart rate while sitting still is over 100 beats a minute.  You have trouble breathing. This information is not intended to replace advice given to you by your health care provider. Make sure you discuss any questions you have with your health care provider. Document Released: 11/19/2005 Document Revised: 06/15/2016 Document Reviewed: 01/13/2016 Elsevier Interactive Patient Education  2018 [employer]  

## 2017-05-02 NOTE — Telephone Encounter (Signed)
Scheduled appt per 5/30 LOS - Gave patient calender.

## 2017-05-03 ENCOUNTER — Other Ambulatory Visit: Payer: Medicare PPO

## 2017-05-03 ENCOUNTER — Ambulatory Visit: Payer: Medicare PPO

## 2017-05-03 NOTE — Progress Notes (Signed)
  Radiation Oncology         [phone] ________________________________  Name: [name] MRN: [id number]  Date: 04/25/2017  DOB: [birth year]  End of Treatment Note  Diagnosis:   STAGE IVB pT3 pN3 cM0 Moderate to poorly differentiated squamous cell carcinoma of the left oral tongue, margins negative by 0.6 cm, negative for PNI and LVSI , depth of invasion 1.5 cm, 3 of 51 lymph nodes positive, positive for focal extranodal extension in 1 node  Indication for treatment:  Curative with concurrent chemotherapy  Radiation treatment dates:   03/12/17 - 04/25/17  Site/dose:     Tongue and bilateral neck / 60 Gy in 30 fractions  Beams/energy:    IMRT / 6 MV photons  Narrative: The patient tolerated radiation treatment relatively well. The patient complained of left shoulder, neck, face, and mouth pain. He had pain with swallowing. His PO intake was with milkshakes, water, and coffee. He used his PEG tube 4-5 times daily. He had confluent mucositis in his mouth, moist peeling of his lips, and hyperpigmentation with dry skin over his neck. The patient presented to the ED on 04/17/17 for dehydration, diarrhea, weakness, and dizziness. He was not discharged until 04/25/17.  Plan: The patient has completed radiation treatment. The patient will return to radiation oncology clinic for routine followup in one half month. I advised the patient to call or return sooner if any questions or concerns arise that are related to recovery or treatment. The patient is scheduled to continue IVF per med/onc orders. -----------------------------------  Eppie Gibson, MD  This document serves as a record of services personally performed by Eppie Gibson, MD. It was created on her behalf by Darcus Austin, a trained medical scribe. The creation of this record is based on the scribe's personal observations and the provider's statements to them. This document has been checked and approved by the attending provider.

## 2017-05-04 ENCOUNTER — Ambulatory Visit: Payer: Medicare PPO

## 2017-05-06 ENCOUNTER — Ambulatory Visit: Payer: Medicare PPO

## 2017-05-07 ENCOUNTER — Ambulatory Visit (HOSPITAL_BASED_OUTPATIENT_CLINIC_OR_DEPARTMENT_OTHER): Payer: Medicare PPO | Admitting: Hematology and Oncology

## 2017-05-07 ENCOUNTER — Ambulatory Visit (HOSPITAL_BASED_OUTPATIENT_CLINIC_OR_DEPARTMENT_OTHER): Payer: Medicare PPO

## 2017-05-07 ENCOUNTER — Other Ambulatory Visit (HOSPITAL_BASED_OUTPATIENT_CLINIC_OR_DEPARTMENT_OTHER): Payer: Medicare PPO

## 2017-05-07 ENCOUNTER — Telehealth: Payer: Self-pay | Admitting: *Deleted

## 2017-05-07 ENCOUNTER — Other Ambulatory Visit: Payer: Self-pay | Admitting: Hematology and Oncology

## 2017-05-07 VITALS — BP 134/68 | HR 96 | Temp 96.3°F | Resp 20 | Ht 68.0 in | Wt 140.8 lb

## 2017-05-07 DIAGNOSIS — Z95828 Presence of other vascular implants and grafts: Secondary | ICD-10-CM

## 2017-05-07 DIAGNOSIS — K1231 Oral mucositis (ulcerative) due to antineoplastic therapy: Secondary | ICD-10-CM

## 2017-05-07 DIAGNOSIS — C021 Malignant neoplasm of border of tongue: Secondary | ICD-10-CM | POA: Diagnosis not present

## 2017-05-07 DIAGNOSIS — H05011 Cellulitis of right orbit: Secondary | ICD-10-CM

## 2017-05-07 DIAGNOSIS — R634 Abnormal weight loss: Secondary | ICD-10-CM | POA: Diagnosis not present

## 2017-05-07 DIAGNOSIS — E876 Hypokalemia: Secondary | ICD-10-CM | POA: Diagnosis not present

## 2017-05-07 LAB — CBC WITH DIFFERENTIAL/PLATELET
BASO%: 0.2 % (ref 0.0–2.0)
Basophils Absolute: 0 10*3/uL (ref 0.0–0.1)
EOS ABS: 0.1 10*3/uL (ref 0.0–0.5)
EOS%: 1.5 % (ref 0.0–7.0)
HCT: 38.8 % (ref 38.4–49.9)
HEMOGLOBIN: 13.2 g/dL (ref 13.0–17.1)
LYMPH#: 0.3 10*3/uL — AB (ref 0.9–3.3)
LYMPH%: 4.3 % — ABNORMAL LOW (ref 14.0–49.0)
MCH: 32.3 pg (ref 27.2–33.4)
MCHC: 34.1 g/dL (ref 32.0–36.0)
MCV: 95 fL (ref 79.3–98.0)
MONO#: 0.8 10*3/uL (ref 0.1–0.9)
MONO%: 14.6 % — ABNORMAL HIGH (ref 0.0–14.0)
NEUT#: 4.6 10*3/uL (ref 1.5–6.5)
NEUT%: 79.4 % — AB (ref 39.0–75.0)
PLATELETS: 346 10*3/uL (ref 140–400)
RBC: 4.09 10*6/uL — ABNORMAL LOW (ref 4.20–5.82)
RDW: 15.3 % — AB (ref 11.0–14.6)
WBC: 5.8 10*3/uL (ref 4.0–10.3)

## 2017-05-07 LAB — COMPREHENSIVE METABOLIC PANEL
ALK PHOS: 62 U/L (ref 40–150)
ALT: 23 U/L (ref 0–55)
AST: 25 U/L (ref 5–34)
Albumin: 3 g/dL — ABNORMAL LOW (ref 3.5–5.0)
Anion Gap: 10 mEq/L (ref 3–11)
BUN: 18.8 mg/dL (ref 7.0–26.0)
CO2: 38 meq/L — AB (ref 22–29)
Calcium: 9.2 mg/dL (ref 8.4–10.4)
Chloride: 85 mEq/L — ABNORMAL LOW (ref 98–109)
Creatinine: 0.8 mg/dL (ref 0.7–1.3)
GLUCOSE: 125 mg/dL (ref 70–140)
POTASSIUM: 2.9 meq/L — AB (ref 3.5–5.1)
SODIUM: 134 meq/L — AB (ref 136–145)
Total Bilirubin: 0.47 mg/dL (ref 0.20–1.20)
Total Protein: 7.3 g/dL (ref 6.4–8.3)

## 2017-05-07 LAB — MAGNESIUM: Magnesium: 1.4 mg/dl — CL (ref 1.5–2.5)

## 2017-05-07 MED ORDER — POTASSIUM CHLORIDE 10 MEQ/100ML IV SOLN
10.0000 meq | INTRAVENOUS | Status: AC
Start: 2017-05-07 — End: 2017-05-07
  Administered 2017-05-07 (×3): 10 meq via INTRAVENOUS
  Filled 2017-05-07: qty 100

## 2017-05-07 MED ORDER — HEPARIN SOD (PORK) LOCK FLUSH 100 UNIT/ML IV SOLN
500.0000 [IU] | Freq: Once | INTRAVENOUS | Status: AC
  Administered 2017-05-07: 500 [IU]
  Filled 2017-05-07: qty 5

## 2017-05-07 MED ORDER — SODIUM CHLORIDE 0.9% FLUSH
10.0000 mL | Freq: Once | INTRAVENOUS | Status: AC
  Administered 2017-05-07: 10 mL
  Filled 2017-05-07: qty 10

## 2017-05-07 MED ORDER — MAGNESIUM SULFATE 4 GM/100ML IV SOLN
4.0000 g | Freq: Once | INTRAVENOUS | Status: AC
  Administered 2017-05-07: 4 g via INTRAVENOUS
  Filled 2017-05-07: qty 100

## 2017-05-07 MED ORDER — SODIUM CHLORIDE 0.9 % IV SOLN
Freq: Once | INTRAVENOUS | Status: AC
  Administered 2017-05-07: 09:00:00 via INTRAVENOUS

## 2017-05-07 MED ORDER — SODIUM CHLORIDE 0.9 % IV SOLN: 4.0000 g | Freq: Once | INTRAVENOUS | Status: DC

## 2017-05-07 MED ORDER — ALTEPLASE 2 MG IJ SOLR
2.0000 mg | Freq: Once | INTRAMUSCULAR | Status: DC | PRN
  Filled 2017-05-07: qty 2

## 2017-05-07 MED ORDER — AMOXICILLIN 250 MG/5ML PO SUSR: 500.0000 mg | Freq: Three times a day (TID) | ORAL | 0 refills | Status: DC

## 2017-05-07 MED ORDER — HEPARIN SOD (PORK) LOCK FLUSH 100 UNIT/ML IV SOLN
250.0000 [IU] | Freq: Once | INTRAVENOUS | Status: DC | PRN
  Filled 2017-05-07: qty 5

## 2017-05-07 MED ORDER — HEPARIN SOD (PORK) LOCK FLUSH 100 UNIT/ML IV SOLN
500.0000 [IU] | Freq: Once | INTRAVENOUS | Status: DC | PRN
  Filled 2017-05-07: qty 5

## 2017-05-07 MED ORDER — SODIUM CHLORIDE 0.9% FLUSH
10.0000 mL | INTRAVENOUS | Status: DC | PRN
  Filled 2017-05-07: qty 10

## 2017-05-07 MED FILL — AMOXICILLIN 250 MG/5 ML SUS: 250 | 5 days supply | Qty: 150 | Fill #0

## 2017-05-07 NOTE — Telephone Encounter (Signed)
CALLED PATIENT TO INFORM OF FU WITH DR. Isidore Moos ON 05-17-17 @ 11:20 AM, SPOKE WITH PATIENT AND HE IS AWARE OF THIS APPT.

## 2017-05-08 ENCOUNTER — Encounter: Payer: Self-pay | Admitting: Hematology and Oncology

## 2017-05-08 ENCOUNTER — Ambulatory Visit: Payer: Medicare PPO | Admitting: Hematology and Oncology

## 2017-05-08 ENCOUNTER — Ambulatory Visit (HOSPITAL_BASED_OUTPATIENT_CLINIC_OR_DEPARTMENT_OTHER): Payer: Medicare PPO

## 2017-05-08 VITALS — BP 147/88 | HR 78 | Temp 97.8°F | Resp 18 | Ht 68.0 in | Wt 143.4 lb

## 2017-05-08 DIAGNOSIS — C021 Malignant neoplasm of border of tongue: Secondary | ICD-10-CM

## 2017-05-08 DIAGNOSIS — H05011 Cellulitis of right orbit: Secondary | ICD-10-CM | POA: Insufficient documentation

## 2017-05-08 DIAGNOSIS — Z95828 Presence of other vascular implants and grafts: Secondary | ICD-10-CM

## 2017-05-08 DIAGNOSIS — E860 Dehydration: Secondary | ICD-10-CM

## 2017-05-08 MED ORDER — HEPARIN SOD (PORK) LOCK FLUSH 100 UNIT/ML IV SOLN
250.0000 [IU] | Freq: Once | INTRAVENOUS | Status: AC | PRN
  Administered 2017-05-08: 500 [IU]
  Filled 2017-05-08: qty 5

## 2017-05-08 MED ORDER — SODIUM CHLORIDE 0.9 % IV SOLN
Freq: Once | INTRAVENOUS | Status: AC
  Administered 2017-05-08: 10:00:00 via INTRAVENOUS

## 2017-05-08 MED ORDER — SODIUM CHLORIDE 0.9% FLUSH
10.0000 mL | INTRAVENOUS | Status: DC | PRN
  Administered 2017-05-08: 10 mL
  Filled 2017-05-08: qty 10

## 2017-05-08 NOTE — Assessment & Plan Note (Signed)
He has persistent mucositis pain His pain is well controlled but he has difficulties with eating, drinking and has persistent nausea He will continue current dose prescription fentanyl patch and I will reassess pain control next week

## 2017-05-08 NOTE — Assessment & Plan Note (Signed)
He has recently completed treatment but has persistent side effects I will resume IV fluids daily for the next 2 weeks I will see him on a weekly basis for supportive care

## 2017-05-08 NOTE — Progress Notes (Signed)
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 This caused him significant discomfort, resulting in decreased food intake.      12/28/2016 Imaging    CT of the neck on 12/28/16 had demonstrate no frank adenopathy in the neck and the tongue was not well visualized die to dental work. On the same day a CT of his chest showed emphysema of the lungs and a few nonspecific pulmonary nodules which were unchanged compared to 06/2015.      01/15/2017 PET scan    This revealed hypermetabolic activity in the left anterior tongue as well as within a small left level 2 node and small nodules in the left parotid gland. No evidence of metastatic disease distantly      01/28/2017 Pathology Results    A.TONGUE, LEFT PARTIAL GLOSSECTOMY: Invasive moderate to poorly-differentiated squamous cell carcinoma, conventional type, 2.5 cm in greatest dimension on gross examination. Invasive squamous cell carcinoma shows greatest depth of invasion of 1.5 cm. Margins uninvolved by invasive tumor (invasive squamous cell carcinoma comes to within 0.6 cm of the inked posterior margin). Negative for lymphovascular invasion Negative for perineural invasion. AJCC Pathologic Stage: pT3 pN3 pM- not applicable. See Cancer Case Summary.  SURGICAL PATHOLOGY CANCER CASE SUMMARY- LIP AND ORAL CAVITY Protocol posting date: June 2017 Procedure: Partial glossectomy Tumor site: Oral, tongue Tumor laterality: left Tumor focality: unifocal Tumor size: Greatest dimension 2.5 cm on gross examination Histologic type: Squamous cell carcinoma, conventional Histologic grade: G2-G3, moderate to poorly differentiated Specimen margins:  Uninvolved by invasive tumor Distance from closest margin: 6 mm (0.6 cm) from posterior margin Lymphovascular invasion: Not identified Perineural invasion: Not identified Regional lymph nodes: Number of lymph nodes involved: 3 Number of lymph nodes examined: 57 Laterality of lymph nodes involved: Bilateral Size of largest metastatic deposit: 9 mm (0.9 cm) Extranodal extension: very focally present in 1 lymph node (part J) Pathologic Stage Classification (pTNM, AJCC 8th Edition) Primary tumor (pT): pT3 Regional lymph nodes (pN): pN3 Distant metastasis (pM): not applicable   B.ANTERIOR DORSAL TONGUE, BIOPSY: Negative for malignancy.  C.POSTERIOR DORSAL TONGUE, BIOPSY: Negative for malignancy.  D.MID DORSAL TONGUE, BIOPSY: Negative for malignancy.  E.DEEP TONGUE, BIOPSY: Negative for malignancy.  F.ANTERIOR FLOOR OF MOUTH, BIOPSY: Negative for malignancy.  G.POSTERIOR FLOOR OF MOUTH, BIOPSY: Negative for malignancy.  H.LEVEL IA, RESECTION: Six benign lymph nodes negative for metastatic carcinoma on routine H&E staining (0/6).  I.LEVEL IB, RESECTION: Two benign lymph nodes negative for metastatic carcinoma on routine H&E staining (0/2). Benign salivary gland tissue.  J.LEFT LEVEL IIA , 3 AND 4, RESECTION: One of twenty-one lymph nodes positive for metastatic carcinoma (0.9 cm focus) with very focal extranodal extension on routine H&E staining (1/21).  K.LEVEL IIB, RESECTION: Four benign lymph nodes negative for metastatic carcinoma on routine H&E staining (0/4). One small focus of benign salivary gland tissue.  L.RIGHT LEVEL IB, RESECTION: Three benign lymph nodes negative for  metastatic carcinoma on routine H&E staining (0/3). Benign salivary gland tissue.  M.RIGHT LEVEL 2A, 3, 4, RESECTION: Two of fifteen lymph nodes positive for metastatic carcinoma (2/15). See comment.  Comment: A small focus suspicious for metastatic carcinoma is seen on the initial H&E stained slide. Therefore, a pankeratin immunohistochemical stain  is performed. The positive control stain functioned appropriately. The pankeratin immunostain highlights a few minute foci of metastatic carcinoma in 2 of the 3 lymph nodes (largest focus 0.2 mm [0.02 cm]).      01/28/2017 Surgery    He had surgery at [facility] <100 SQCM [15100] (SPLIT THICKNESS SKIN GRAFT LEG)           03/01/2017 Procedure    Successful fluoroscopic guided percutaneous gastrostomy tube placement.      03/01/2017 Procedure    Placement of a subcutaneous port device. Catheter tip at the superior cavoatrial junction and ready to be used.      03/12/2017 - 04/25/2017 Radiation Therapy    He received radiation treatment       03/13/2017 Procedure    Baseline hearing is near normal      03/15/2017 - 04/12/2017 Chemotherapy    He received cisplatin x 5 weekly doses      04/17/2017 - 04/25/2017 Hospital Admission    He was admitted to the hospital for management of severe mucositis pain, dehydration, dizziness and uncontrolled nausea       INTERVAL HISTORY: Please see below for problem oriented charting. He is seen in the infusion room. He developed periorbital right eye cellulitis.  He has no visual change He has missed several days of IV fluids appointment last week. He continued to have poor oral intake.  His pain appears to be under control He has some mild nausea and excessive mucus secretion. Denies recent constipation.  REVIEW OF SYSTEMS:   Constitutional: Denies fevers, chills or  abnormal weight loss Eyes: Denies blurriness of vision Ears, nose, mouth, throat, and face: Denies mucositis or sore throat Respiratory: Denies cough, dyspnea or wheezes Cardiovascular: Denies palpitation, chest discomfort or lower extremity swelling Lymphatics: Denies new lymphadenopathy or easy bruising Neurological:Denies numbness, tingling or new weaknesses Behavioral/Psych: Mood is stable, no new changes  All other systems were reviewed with the patient and are negative.  I have reviewed the past medical history, past surgical history, social history and family history with the patient and they are unchanged from previous note.  ALLERGIES:  is allergic to latex; adhesive [tape]; and codeine.  MEDICATIONS:  Current Outpatient Prescriptions  Medication Sig Dispense Refill  . amoxicillin (AMOXIL) 250 MG/5ML suspension Take 10 mLs (500 mg total) by mouth 3 (three) times daily. 150 mL 0  . aspirin 81 MG chewable tablet Chew 81 mg by mouth every morning.     Marland Kitchen dexamethasone (DECADRON) 4 MG tablet     . fentaNYL (DURAGESIC - DOSED MCG/HR) 12 MCG/HR Place 3 patches (37.5 mcg total) onto the skin every 3 (three) days. 10 patch 0  . lidocaine-prilocaine (EMLA) cream Apply to affected area once 30 g 3  . magic mouthwash w/lidocaine SOLN Take 5 mLs by mouth 4 (four) times daily as needed for mouth pain. (Patient not taking: Reported on 03/28/2017) 240 mL 0  . magnesium oxide (MAG-OX) 400 (241.3 Mg) MG tablet Take 1 tablet (400 mg total) by mouth 2 (two) times daily. 60 tablet 9  . morphine (ROXANOL) 20 MG/ML concentrated solution Take 0.5 mLs (10 mg total) by mouth every 2 (two) hours as needed for severe pain. 120 mL 0  . ondansetron (ZOFRAN) 8 MG tablet Take 1 tablet (8 mg total) by mouth every 8 (eight) hours as needed. Start on the third day after chemotherapy. (Patient not taking: Reported  on 03/21/2017) 30 tablet 1  . prochlorperazine (COMPAZINE) 10 MG tablet Take 1 tablet (10 mg total) by  mouth every 6 (six) hours as needed (Nausea or vomiting). (Patient not taking: Reported on 03/21/2017) 30 tablet 1  . scopolamine (TRANSDERM-SCOP) 1 MG/3DAYS Place 1 patch (1.5 mg total) onto the skin every 3 (three) days. 10 patch 12   No current facility-administered medications for this visit.    Facility-Administered Medications Ordered in Other Visits  Medication Dose Route Frequency Provider Last Rate Last Dose  . sodium chloride flush (NS) 0.9 % injection 10 mL  10 mL Intracatheter PRN Alvy Bimler, Britteney Ayotte, MD   10 mL at 05/08/17 1218    PHYSICAL EXAMINATION: ECOG PERFORMANCE STATUS: 1 - Symptomatic but completely ambulatory GENERAL:alert, no distress and comfortable SKIN: Noted signs of cellulitis around the right eye.  EYES: normal, Conjunctiva are pink and non-injected, sclera clear OROPHARYNX:no exudate, no erythema and lips, buccal mucosa, and tongue normal.  No oral thrush NECK: supple, thyroid normal size, non-tender, without nodularity LYMPH:  no palpable lymphadenopathy in the cervical, axillary or inguinal LUNGS: clear to auscultation and percussion with normal breathing effort HEART: regular rate & rhythm and no murmurs and no lower extremity edema ABDOMEN:abdomen soft, non-tender and normal bowel sounds Musculoskeletal:no cyanosis of digits and no clubbing  NEURO: alert & oriented x 3 with fluent speech, no focal motor/sensory deficits  LABORATORY DATA:  I have reviewed the data as listed    Component Value Date/Time   NA 134 (L) 05/07/2017 0847   K 2.9 (LL) 05/07/2017 0847   CL 97 (L) 04/23/2017 0430   CO2 38 (H) 05/07/2017 0847   GLUCOSE 125 05/07/2017 0847   BUN 18.8 05/07/2017 0847   CREATININE 0.8 05/07/2017 0847   CALCIUM 9.2 05/07/2017 0847   PROT 7.3 05/07/2017 0847   ALBUMIN 3.0 (L) 05/07/2017 0847   AST 25 05/07/2017 0847   ALT 23 05/07/2017 0847   ALKPHOS 62 05/07/2017 0847   BILITOT 0.47 05/07/2017 0847   GFRNONAA >60 04/23/2017 0430   GFRAA >60  04/23/2017 0430    No results found for: SPEP, UPEP  Lab Results  Component Value Date   WBC 5.8 05/07/2017   NEUTROABS 4.6 05/07/2017   HGB 13.2 05/07/2017   HCT 38.8 05/07/2017   MCV 95.0 05/07/2017   PLT 346 05/07/2017      Chemistry      Component Value Date/Time   NA 134 (L) 05/07/2017 0847   K 2.9 (LL) 05/07/2017 0847   CL 97 (L) 04/23/2017 0430   CO2 38 (H) 05/07/2017 0847   BUN 18.8 05/07/2017 0847   CREATININE 0.8 05/07/2017 0847      Component Value Date/Time   CALCIUM 9.2 05/07/2017 0847   ALKPHOS 62 05/07/2017 0847   AST 25 05/07/2017 0847   ALT 23 05/07/2017 0847   BILITOT 0.47 05/07/2017 0847       RADIOGRAPHIC STUDIES: I have personally reviewed the radiological images as listed and agreed with the findings in the report. Ct Abdomen Wo Contrast  Result Date: 04/19/2017 CLINICAL DATA:  Pain at gastrostomy tube site. EXAM: CT ABDOMEN WITHOUT CONTRAST TECHNIQUE: Multidetector CT imaging of the abdomen was performed following the standard protocol without IV contrast. COMPARISON:  CT scan 02/02/2014 FINDINGS: Lower chest: The lung bases are clear of acute process. Minimal dependent subpleural bibasilar atelectasis. No worrisome pulmonary lesions. Three-vessel coronary artery calcifications are noted. Hepatobiliary: A few small scattered hepatic cysts are noted. No worrisome hepatic  lesions or intrahepatic biliary dilatation. The gallbladder is surgically absent. No common bile duct dilatation. Pancreas: No mass, inflammation or ductal dilatation. Small duodenum diverticulum noted near the pancreatic head. Spleen: Normal size.  No focal lesions. Adrenals/Urinary Tract: Stable left adrenal gland nodule consistent with benign adenoma. The right adrenal gland is normal. No renal mass or renal calculi. Stomach/Bowel: The stomach, duodenum, visualized small bowel and visualize colon are unremarkable. The gastrostomy tube is in good position. No complicating features are  identified. Vascular/Lymphatic: Atherosclerotic calcifications involving the aorta and branch vessels. There is a small saccular aneurysm noted along the medial aspect of the proximal right common iliac artery measuring 18.5 x 12.5 mm. No mesenteric or retroperitoneal mass or adenopathy. Small scattered lymph nodes are noted. Other: No ascites or abdominal wall hernia. Evidence of prior hernia repair with anterior abdominal wall mesh. Musculoskeletal: No significant bony findings. IMPRESSION: 1. The gastrostomy tube is in good position and I do not identify any complicating features associated with it. 2. No acute abdominal findings, mass lesions or adenopathy. 3. Small left adrenal gland adenoma. 4. Advanced atherosclerotic calcifications involving the aorta and branch vessels. Three-vessel coronary artery calcifications are noted. 5. 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 If the Electronically Signed   By: Marijo Sanes M.D.   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) He has recently completed treatment but has persistent side effects I will resume IV fluids daily for the next 2 weeks I will see him on a weekly basis for supportive care  Hypomagnesemia He has low magnesium likely due to chemo I recommend magnesium replacement therapy IV and PO through PEG tube  Hypokalemia He has severe hypokalemia secondary to severe hypomagnesemia I will replace with supplemental magnesium and potassium replacement therapy  Mucositis due to antineoplastic therapy He has persistent mucositis pain His pain is well controlled but he has difficulties with eating, drinking and has persistent nausea He will continue current dose prescription fentanyl patch and I will reassess pain control next week  Weight loss He has progressive weight loss since treatment is completed We discussed the importance of increasing  nutritional supplement as tolerated He will continue close follow-up with dietitian  Cellulitis of right orbital region He has evidence of skin cellulitis. I will prescribe antibiotic treatment.   No orders of the defined types were placed in this encounter.  All questions were answered. The patient knows to call the clinic with any problems, questions or concerns. No barriers to learning was detected. I spent 20 minutes counseling the patient face to face. The total time spent in the appointment was 30 minutes and more than 50% was on counseling and review of test results     Heath Lark, MD 05/08/2017 1:23 PM

## 2017-05-08 NOTE — Assessment & Plan Note (Signed)
He has severe hypokalemia secondary to severe hypomagnesemia I will replace with supplemental magnesium and potassium replacement therapy

## 2017-05-08 NOTE — Assessment & Plan Note (Signed)
He has progressive weight loss since treatment is completed We discussed the importance of increasing nutritional supplement as tolerated He will continue close follow-up with dietitian

## 2017-05-08 NOTE — Assessment & Plan Note (Signed)
He has evidence of skin cellulitis. I will prescribe antibiotic treatment.

## 2017-05-08 NOTE — Patient Instructions (Signed)
Dehydration, Adult Dehydration is when there is not enough fluid or water in your body. This happens when you lose more fluids than you take in. Dehydration can range from mild to very bad. It should be treated right away to keep it from getting very bad. Symptoms of mild dehydration may include:  Thirst.  Dry lips.  Slightly dry mouth.  Dry, warm skin.  Dizziness. Symptoms of moderate dehydration may include:  Very dry mouth.  Muscle cramps.  Dark pee (urine). Pee may be the color of tea.  Your body making less pee.  Your eyes making fewer tears.  Heartbeat that is uneven or faster than normal (palpitations).  Headache.  Light-headedness, especially when you stand up from sitting.  Fainting (syncope). Symptoms of very bad dehydration may include:  Changes in skin, such as: ? Cold and clammy skin. ? Blotchy (mottled) or pale skin. ? Skin that does not quickly return to normal after being lightly pinched and let go (poor skin turgor).  Changes in body fluids, such as: ? Feeling very thirsty. ? Your eyes making fewer tears. ? Not sweating when body temperature is high, such as in hot weather. ? Your body making very little pee.  Changes in vital signs, such as: ? Weak pulse. ? Pulse that is more than 100 beats a minute when you are sitting still. ? Fast breathing. ? Low blood pressure.  Other changes, such as: ? Sunken eyes. ? Cold hands and feet. ? Confusion. ? Lack of energy (lethargy). ? Trouble waking up from sleep. ? Short-term weight loss. ? Unconsciousness. Follow these instructions at home:  If told by your doctor, drink an ORS: ? Make an ORS by using instructions on the package. ? Start by drinking small amounts, about  cup (120 mL) every 5-10 minutes. ? Slowly drink more until you have had the amount that your doctor said to have.  Drink enough clear fluid to keep your pee clear or pale yellow. If you were told to drink an ORS, finish the ORS  first, then start slowly drinking clear fluids. Drink fluids such as: ? Water. Do not drink only water by itself. Doing that can make the salt (sodium) level in your body get too low (hyponatremia). ? Ice chips. ? Fruit juice that you have added water to (diluted). ? Low-calorie sports drinks.  Avoid: ? Alcohol. ? Drinks that have a lot of sugar. These include high-calorie sports drinks, fruit juice that does not have water added, and soda. ? Caffeine. ? Foods that are greasy or have a lot of fat or sugar.  Take over-the-counter and prescription medicines only as told by your doctor.  Do not take salt tablets. Doing that can make the salt level in your body get too high (hypernatremia).  Eat foods that have minerals (electrolytes). Examples include bananas, oranges, potatoes, tomatoes, and spinach.  Keep all follow-up visits as told by your doctor. This is important. Contact a doctor if:  You have belly (abdominal) pain that: ? Gets worse. ? Stays in one area (localizes).  You have a rash.  You have a stiff neck.  You get angry or annoyed more easily than normal (irritability).  You are more sleepy than normal.  You have a harder time waking up than normal.  You feel: ? Weak. ? Dizzy. ? Very thirsty.  You have peed (urinated) only a small amount of very dark pee during 6-8 hours. Get help right away if:  You have symptoms of   very bad dehydration.  You cannot drink fluids without throwing up (vomiting).  Your symptoms get worse with treatment.  You have a fever.  You have a very bad headache.  You are throwing up or having watery poop (diarrhea) and it: ? Gets worse. ? Does not go away.  You have blood or something green (bile) in your throw-up.  You have blood in your poop (stool). This may cause poop to look black and tarry.  You have not peed in 6-8 hours.  You pass out (faint).  Your heart rate when you are sitting still is more than 100 beats a  minute.  You have trouble breathing. This information is not intended to replace advice given to you by your health care provider. Make sure you discuss any questions you have with your health care provider. Document Released: 09/15/2009 Document Revised: 06/08/2016 Document Reviewed: 01/13/2016 Elsevier Interactive Patient Education  2018 [employer]  

## 2017-05-08 NOTE — Assessment & Plan Note (Signed)
He has low magnesium likely due to chemo I recommend magnesium replacement therapy IV and PO through PEG tube

## 2017-05-09 ENCOUNTER — Telehealth: Payer: Self-pay

## 2017-05-09 ENCOUNTER — Ambulatory Visit (HOSPITAL_BASED_OUTPATIENT_CLINIC_OR_DEPARTMENT_OTHER): Payer: Medicare PPO

## 2017-05-09 ENCOUNTER — Other Ambulatory Visit: Payer: Self-pay | Admitting: Hematology and Oncology

## 2017-05-09 VITALS — BP 159/91 | HR 71 | Resp 18 | Wt 141.2 lb

## 2017-05-09 DIAGNOSIS — C021 Malignant neoplasm of border of tongue: Secondary | ICD-10-CM

## 2017-05-09 DIAGNOSIS — Z95828 Presence of other vascular implants and grafts: Secondary | ICD-10-CM

## 2017-05-09 DIAGNOSIS — H10021 Other mucopurulent conjunctivitis, right eye: Secondary | ICD-10-CM

## 2017-05-09 DIAGNOSIS — H109 Unspecified conjunctivitis: Secondary | ICD-10-CM | POA: Insufficient documentation

## 2017-05-09 MED ORDER — HEPARIN SOD (PORK) LOCK FLUSH 100 UNIT/ML IV SOLN
500.0000 [IU] | Freq: Once | INTRAVENOUS | Status: AC | PRN
  Administered 2017-05-09: 500 [IU]
  Filled 2017-05-09: qty 5

## 2017-05-09 MED ORDER — SODIUM CHLORIDE 0.9 % IV SOLN
Freq: Once | INTRAVENOUS | Status: AC
  Administered 2017-05-09: 09:00:00 via INTRAVENOUS

## 2017-05-09 MED ORDER — TOBRAMYCIN 0.3 % OP SOLN: 1.0000 [drp] | OPHTHALMIC | 0 refills | Status: DC

## 2017-05-09 MED ORDER — SODIUM CHLORIDE 0.9% FLUSH
10.0000 mL | INTRAVENOUS | Status: DC | PRN
  Administered 2017-05-09: 10 mL
  Filled 2017-05-09: qty 10

## 2017-05-09 MED FILL — TOBRAMYCIN 0.3% EYE DROPS: 0.3 | 17 days supply | Qty: 5 | Fill #0

## 2017-05-09 NOTE — Telephone Encounter (Signed)
[facility] outpatient pharmacy please. They were in agreement about an eye doctor.

## 2017-05-09 NOTE — Telephone Encounter (Signed)
-----   Message from Heath Lark, MD sent at 05/09/2017  9:44 AM EDT ----- Regarding: RE: pt's right eye I am not an eye doctor. I started him on amoxicillin antibiotics and if not better he needs to see an ophthalmologist. I can prescribe an eye drop for him to take while waiting to see the eye doctor. Please let me know where I should send the prescription to ----- Message ----- From: Janace Hoard, RN Sent: 05/09/2017   9:31 AM To: [facility] Subject: pt's right eye                                 Pt states his R eye feels worse than yesterday, it feels scratchy, he is on antibiotic, there is exudate on his eyelashes, he cannot open it fully, there is redness around the eye and upward onto his forehead. He is in exam room 24 and asking if Dr Alvy Bimler can come look at his eye. He will be here until 1115 am. Juliann Pulse in [facility]

## 2017-05-10 ENCOUNTER — Ambulatory Visit (HOSPITAL_BASED_OUTPATIENT_CLINIC_OR_DEPARTMENT_OTHER): Payer: Medicare PPO

## 2017-05-10 ENCOUNTER — Other Ambulatory Visit (HOSPITAL_BASED_OUTPATIENT_CLINIC_OR_DEPARTMENT_OTHER): Payer: Medicare PPO

## 2017-05-10 VITALS — BP 160/86 | HR 95 | Temp 97.6°F | Resp 18 | Ht 68.0 in | Wt 141.2 lb

## 2017-05-10 DIAGNOSIS — C021 Malignant neoplasm of border of tongue: Secondary | ICD-10-CM | POA: Diagnosis not present

## 2017-05-10 DIAGNOSIS — Z95828 Presence of other vascular implants and grafts: Secondary | ICD-10-CM

## 2017-05-10 LAB — CBC WITH DIFFERENTIAL/PLATELET
BASO%: 0.6 % (ref 0.0–2.0)
Basophils Absolute: 0 10*3/uL (ref 0.0–0.1)
EOS%: 2.2 % (ref 0.0–7.0)
Eosinophils Absolute: 0.1 10*3/uL (ref 0.0–0.5)
HEMATOCRIT: 38 % — AB (ref 38.4–49.9)
HEMOGLOBIN: 12.8 g/dL — AB (ref 13.0–17.1)
LYMPH#: 0.3 10*3/uL — AB (ref 0.9–3.3)
LYMPH%: 8.5 % — ABNORMAL LOW (ref 14.0–49.0)
MCH: 32 pg (ref 27.2–33.4)
MCHC: 33.8 g/dL (ref 32.0–36.0)
MCV: 94.7 fL (ref 79.3–98.0)
MONO#: 1 10*3/uL — ABNORMAL HIGH (ref 0.1–0.9)
MONO%: 26.9 % — AB (ref 0.0–14.0)
NEUT#: 2.3 10*3/uL (ref 1.5–6.5)
NEUT%: 61.8 % (ref 39.0–75.0)
Platelets: 288 10*3/uL (ref 140–400)
RBC: 4.01 10*6/uL — ABNORMAL LOW (ref 4.20–5.82)
RDW: 15.8 % — ABNORMAL HIGH (ref 11.0–14.6)
WBC: 3.7 10*3/uL — AB (ref 4.0–10.3)

## 2017-05-10 LAB — COMPREHENSIVE METABOLIC PANEL
ALBUMIN: 3 g/dL — AB (ref 3.5–5.0)
ALK PHOS: 61 U/L (ref 40–150)
ALT: 34 U/L (ref 0–55)
AST: 39 U/L — ABNORMAL HIGH (ref 5–34)
Anion Gap: 11 mEq/L (ref 3–11)
BILIRUBIN TOTAL: 0.36 mg/dL (ref 0.20–1.20)
BUN: 8.1 mg/dL (ref 7.0–26.0)
CO2: 32 mEq/L — ABNORMAL HIGH (ref 22–29)
Calcium: 9 mg/dL (ref 8.4–10.4)
Chloride: 91 mEq/L — ABNORMAL LOW (ref 98–109)
Creatinine: 0.7 mg/dL (ref 0.7–1.3)
EGFR: >90 mL/min/{1.73_m2} (ref >90)
GLUCOSE: 121 mg/dL (ref 70–140)
POTASSIUM: 2.9 meq/L — AB (ref 3.5–5.1)
SODIUM: 134 meq/L — AB (ref 136–145)
Total Protein: 7.3 g/dL (ref 6.4–8.3)

## 2017-05-10 LAB — MAGNESIUM: MAGNESIUM: 1.2 mg/dL — AB (ref 1.5–2.5)

## 2017-05-10 MED ORDER — SODIUM CHLORIDE 0.9% FLUSH
10.0000 mL | INTRAVENOUS | Status: DC | PRN
  Administered 2017-05-10: 10 mL
  Filled 2017-05-10: qty 10

## 2017-05-10 MED ORDER — SODIUM CHLORIDE 0.9 % IV SOLN
INTRAVENOUS | Status: DC
  Filled 2017-05-10: qty 1000

## 2017-05-10 MED ORDER — HEPARIN SOD (PORK) LOCK FLUSH 100 UNIT/ML IV SOLN
250.0000 [IU] | Freq: Once | INTRAVENOUS | Status: AC | PRN
  Administered 2017-05-10: 500 [IU]
  Filled 2017-05-10: qty 5

## 2017-05-10 MED ORDER — PROMETHAZINE HCL 25 MG/ML IJ SOLN
25.0000 mg | Freq: Once | INTRAMUSCULAR | Status: DC
  Filled 2017-05-10: qty 1

## 2017-05-10 MED ORDER — SODIUM CHLORIDE 0.9 % IV SOLN
Freq: Once | INTRAVENOUS | Status: AC
  Administered 2017-05-10: 10:00:00 via INTRAVENOUS

## 2017-05-10 MED ORDER — SODIUM CHLORIDE 0.9 % IV SOLN
INTRAVENOUS | Status: DC
  Administered 2017-05-10: 12:00:00 via INTRAVENOUS
  Filled 2017-05-10 (×2): qty 1000

## 2017-05-10 NOTE — Patient Instructions (Signed)
Dehydration, Adult Dehydration is a condition in which there is not enough fluid or water in the body. This happens when you lose more fluids than you take in. Important organs, such as the kidneys, brain, and heart, cannot function without a proper amount of fluids. Any loss of fluids from the body can lead to dehydration. Dehydration can range from mild to severe. This condition should be treated right away to prevent it from becoming severe. What are the causes? This condition may be caused by:  Vomiting.  Diarrhea.  Excessive sweating, such as from heat exposure or exercise.  Not drinking enough fluid, especially: ? When ill. ? While doing activity that requires a lot of energy.  Excessive urination.  Fever.  Infection.  Certain medicines, such as medicines that cause the body to lose excess fluid (diuretics).  Inability to access safe drinking water.  Reduced physical ability to get adequate water and food.  What increases the risk? This condition is more likely to develop in people:  Who have a poorly controlled long-term (chronic) illness, such as diabetes, heart disease, or kidney disease.  Who are age 65 or older.  Who are disabled.  Who live in a place with high altitude.  Who play endurance sports.  What are the signs or symptoms? Symptoms of mild dehydration may include:  Thirst.  Dry lips.  Slightly dry mouth.  Dry, warm skin.  Dizziness. Symptoms of moderate dehydration may include:  Very dry mouth.  Muscle cramps.  Dark urine. Urine may be the color of tea.  Decreased urine production.  Decreased tear production.  Heartbeat that is irregular or faster than normal (palpitations).  Headache.  Light-headedness, especially when you stand up from a sitting position.  Fainting (syncope). Symptoms of severe dehydration may include:  Changes in skin, such as: ? Cold and clammy skin. ? Blotchy (mottled) or pale skin. ? Skin that does  not quickly return to normal after being lightly pinched and released (poor skin turgor).  Changes in body fluids, such as: ? Extreme thirst. ? No tear production. ? Inability to sweat when body temperature is high, such as in hot weather. ? Very little urine production.  Changes in vital signs, such as: ? Weak pulse. ? Pulse that is more than 100 beats a minute when sitting still. ? Rapid breathing. ? Low blood pressure.  Other changes, such as: ? Sunken eyes. ? Cold hands and feet. ? Confusion. ? Lack of energy (lethargy). ? Difficulty waking up from sleep. ? Short-term weight loss. ? Unconsciousness. How is this diagnosed? This condition is diagnosed based on your symptoms and a physical exam. Blood and urine tests may be done to help confirm the diagnosis. How is this treated? Treatment for this condition depends on the severity. Mild or moderate dehydration can often be treated at home. Treatment should be started right away. Do not wait until dehydration becomes severe. Severe dehydration is an emergency and it needs to be treated in a hospital. Treatment for mild dehydration may include:  Drinking more fluids.  Replacing salts and minerals in your blood (electrolytes) that you may have lost. Treatment for moderate dehydration may include:  Drinking an oral rehydration solution (ORS). This is a drink that helps you replace fluids and electrolytes (rehydrate). It can be found at pharmacies and retail stores. Treatment for severe dehydration may include:  Receiving fluids through an IV tube.  Receiving an electrolyte solution through a feeding tube that is passed through your nose   and into your stomach (nasogastric tube, or NG tube).  Correcting any abnormalities in electrolytes.  Treating the underlying cause of dehydration. Follow these instructions at home:  If directed by your health care provider, drink an ORS: ? Make an ORS by following instructions on the  package. ? Start by drinking small amounts, about  cup (120 mL) every 5-10 minutes. ? Slowly increase how much you drink until you have taken the amount recommended by your health care provider.  Drink enough clear fluid to keep your urine clear or pale yellow. If you were told to drink an ORS, finish the ORS first, then start slowly drinking other clear fluids. Drink fluids such as: ? Water. Do not drink only water. Doing that can lead to having too little salt (sodium) in the body (hyponatremia). ? Ice chips. ? Fruit juice that you have added water to (diluted fruit juice). ? Low-calorie sports drinks.  Avoid: ? Alcohol. ? Drinks that contain a lot of sugar. These include high-calorie sports drinks, fruit juice that is not diluted, and soda. ? Caffeine. ? Foods that are greasy or contain a lot of fat or sugar.  Take over-the-counter and prescription medicines only as told by your health care provider.  Do not take sodium tablets. This can lead to having too much sodium in the body (hypernatremia).  Eat foods that contain a healthy balance of electrolytes, such as bananas, oranges, potatoes, tomatoes, and spinach.  Keep all follow-up visits as told by your health care provider. This is important. Contact a health care provider if:  You have abdominal pain that: ? Gets worse. ? Stays in one area (localizes).  You have a rash.  You have a stiff neck.  You are more irritable than usual.  You are sleepier or more difficult to wake up than usual.  You feel weak or dizzy.  You feel very thirsty.  You have urinated only a small amount of very dark urine over 6-8 hours. Get help right away if:  You have symptoms of severe dehydration.  You cannot drink fluids without vomiting.  Your symptoms get worse with treatment.  You have a fever.  You have a severe headache.  You have vomiting or diarrhea that: ? Gets worse. ? Does not go away.  You have blood or green matter  (bile) in your vomit.  You have blood in your stool. This may cause stool to look black and tarry.  You have not urinated in 6-8 hours.  You faint.  Your heart rate while sitting still is over 100 beats a minute.  You have trouble breathing. This information is not intended to replace advice given to you by your health care provider. Make sure you discuss any questions you have with your health care provider. Document Released: 11/19/2005 Document Revised: 06/15/2016 Document Reviewed: 01/13/2016 Elsevier Interactive Patient Education  2018 [employer]  

## 2017-05-11 ENCOUNTER — Ambulatory Visit (HOSPITAL_BASED_OUTPATIENT_CLINIC_OR_DEPARTMENT_OTHER): Payer: Medicare PPO

## 2017-05-11 VITALS — BP 126/97 | HR 101 | Temp 98.1°F | Resp 17

## 2017-05-11 DIAGNOSIS — C021 Malignant neoplasm of border of tongue: Secondary | ICD-10-CM

## 2017-05-11 DIAGNOSIS — Z95828 Presence of other vascular implants and grafts: Secondary | ICD-10-CM

## 2017-05-11 DIAGNOSIS — E860 Dehydration: Secondary | ICD-10-CM

## 2017-05-11 MED ORDER — SODIUM CHLORIDE 0.9% FLUSH
10.0000 mL | INTRAVENOUS | Status: DC | PRN
  Administered 2017-05-11: 10 mL
  Filled 2017-05-11: qty 10

## 2017-05-11 MED ORDER — SODIUM CHLORIDE 0.9 % IV SOLN
Freq: Once | INTRAVENOUS | Status: AC
  Administered 2017-05-11: 08:00:00 via INTRAVENOUS

## 2017-05-11 MED ORDER — HEPARIN SOD (PORK) LOCK FLUSH 100 UNIT/ML IV SOLN
500.0000 [IU] | Freq: Once | INTRAVENOUS | Status: AC | PRN
  Administered 2017-05-11: 500 [IU]
  Filled 2017-05-11: qty 5

## 2017-05-11 NOTE — Patient Instructions (Signed)
Dehydration, Adult Dehydration is a condition in which there is not enough fluid or water in the body. This happens when you lose more fluids than you take in. Important organs, such as the kidneys, brain, and heart, cannot function without a proper amount of fluids. Any loss of fluids from the body can lead to dehydration. Dehydration can range from mild to severe. This condition should be treated right away to prevent it from becoming severe. What are the causes? This condition may be caused by:  Vomiting.  Diarrhea.  Excessive sweating, such as from heat exposure or exercise.  Not drinking enough fluid, especially: ? When ill. ? While doing activity that requires a lot of energy.  Excessive urination.  Fever.  Infection.  Certain medicines, such as medicines that cause the body to lose excess fluid (diuretics).  Inability to access safe drinking water.  Reduced physical ability to get adequate water and food.  What increases the risk? This condition is more likely to develop in people:  Who have a poorly controlled long-term (chronic) illness, such as diabetes, heart disease, or kidney disease.  Who are age 65 or older.  Who are disabled.  Who live in a place with high altitude.  Who play endurance sports.  What are the signs or symptoms? Symptoms of mild dehydration may include:  Thirst.  Dry lips.  Slightly dry mouth.  Dry, warm skin.  Dizziness. Symptoms of moderate dehydration may include:  Very dry mouth.  Muscle cramps.  Dark urine. Urine may be the color of tea.  Decreased urine production.  Decreased tear production.  Heartbeat that is irregular or faster than normal (palpitations).  Headache.  Light-headedness, especially when you stand up from a sitting position.  Fainting (syncope). Symptoms of severe dehydration may include:  Changes in skin, such as: ? Cold and clammy skin. ? Blotchy (mottled) or pale skin. ? Skin that does  not quickly return to normal after being lightly pinched and released (poor skin turgor).  Changes in body fluids, such as: ? Extreme thirst. ? No tear production. ? Inability to sweat when body temperature is high, such as in hot weather. ? Very little urine production.  Changes in vital signs, such as: ? Weak pulse. ? Pulse that is more than 100 beats a minute when sitting still. ? Rapid breathing. ? Low blood pressure.  Other changes, such as: ? Sunken eyes. ? Cold hands and feet. ? Confusion. ? Lack of energy (lethargy). ? Difficulty waking up from sleep. ? Short-term weight loss. ? Unconsciousness. How is this diagnosed? This condition is diagnosed based on your symptoms and a physical exam. Blood and urine tests may be done to help confirm the diagnosis. How is this treated? Treatment for this condition depends on the severity. Mild or moderate dehydration can often be treated at home. Treatment should be started right away. Do not wait until dehydration becomes severe. Severe dehydration is an emergency and it needs to be treated in a hospital. Treatment for mild dehydration may include:  Drinking more fluids.  Replacing salts and minerals in your blood (electrolytes) that you may have lost. Treatment for moderate dehydration may include:  Drinking an oral rehydration solution (ORS). This is a drink that helps you replace fluids and electrolytes (rehydrate). It can be found at pharmacies and retail stores. Treatment for severe dehydration may include:  Receiving fluids through an IV tube.  Receiving an electrolyte solution through a feeding tube that is passed through your nose   and into your stomach (nasogastric tube, or NG tube).  Correcting any abnormalities in electrolytes.  Treating the underlying cause of dehydration. Follow these instructions at home:  If directed by your health care provider, drink an ORS: ? Make an ORS by following instructions on the  package. ? Start by drinking small amounts, about  cup (120 mL) every 5-10 minutes. ? Slowly increase how much you drink until you have taken the amount recommended by your health care provider.  Drink enough clear fluid to keep your urine clear or pale yellow. If you were told to drink an ORS, finish the ORS first, then start slowly drinking other clear fluids. Drink fluids such as: ? Water. Do not drink only water. Doing that can lead to having too little salt (sodium) in the body (hyponatremia). ? Ice chips. ? Fruit juice that you have added water to (diluted fruit juice). ? Low-calorie sports drinks.  Avoid: ? Alcohol. ? Drinks that contain a lot of sugar. These include high-calorie sports drinks, fruit juice that is not diluted, and soda. ? Caffeine. ? Foods that are greasy or contain a lot of fat or sugar.  Take over-the-counter and prescription medicines only as told by your health care provider.  Do not take sodium tablets. This can lead to having too much sodium in the body (hypernatremia).  Eat foods that contain a healthy balance of electrolytes, such as bananas, oranges, potatoes, tomatoes, and spinach.  Keep all follow-up visits as told by your health care provider. This is important. Contact a health care provider if:  You have abdominal pain that: ? Gets worse. ? Stays in one area (localizes).  You have a rash.  You have a stiff neck.  You are more irritable than usual.  You are sleepier or more difficult to wake up than usual.  You feel weak or dizzy.  You feel very thirsty.  You have urinated only a small amount of very dark urine over 6-8 hours. Get help right away if:  You have symptoms of severe dehydration.  You cannot drink fluids without vomiting.  Your symptoms get worse with treatment.  You have a fever.  You have a severe headache.  You have vomiting or diarrhea that: ? Gets worse. ? Does not go away.  You have blood or green matter  (bile) in your vomit.  You have blood in your stool. This may cause stool to look black and tarry.  You have not urinated in 6-8 hours.  You faint.  Your heart rate while sitting still is over 100 beats a minute.  You have trouble breathing. This information is not intended to replace advice given to you by your health care provider. Make sure you discuss any questions you have with your health care provider. Document Released: 11/19/2005 Document Revised: 06/15/2016 Document Reviewed: 01/13/2016 Elsevier Interactive Patient Education  2018 [employer]  

## 2017-05-13 ENCOUNTER — Encounter: Payer: Self-pay | Admitting: *Deleted

## 2017-05-13 ENCOUNTER — Ambulatory Visit (HOSPITAL_BASED_OUTPATIENT_CLINIC_OR_DEPARTMENT_OTHER): Payer: Medicare PPO

## 2017-05-13 ENCOUNTER — Other Ambulatory Visit (HOSPITAL_BASED_OUTPATIENT_CLINIC_OR_DEPARTMENT_OTHER): Payer: Medicare PPO

## 2017-05-13 ENCOUNTER — Other Ambulatory Visit: Payer: Self-pay | Admitting: *Deleted

## 2017-05-13 VITALS — BP 102/64 | HR 113 | Resp 18 | Ht 68.0 in | Wt 136.4 lb

## 2017-05-13 DIAGNOSIS — H05011 Cellulitis of right orbit: Secondary | ICD-10-CM

## 2017-05-13 DIAGNOSIS — E860 Dehydration: Secondary | ICD-10-CM | POA: Diagnosis not present

## 2017-05-13 DIAGNOSIS — Z95828 Presence of other vascular implants and grafts: Secondary | ICD-10-CM

## 2017-05-13 DIAGNOSIS — C021 Malignant neoplasm of border of tongue: Secondary | ICD-10-CM

## 2017-05-13 LAB — COMPREHENSIVE METABOLIC PANEL WITH GFR
ALT: 27 U/L (ref 0–55)
AST: 31 U/L (ref 5–34)
Albumin: 3.3 g/dL — ABNORMAL LOW (ref 3.5–5.0)
Alkaline Phosphatase: 61 U/L (ref 40–150)
Anion Gap: 10 meq/L (ref 3–11)
BUN: 23.7 mg/dL (ref 7.0–26.0)
CO2: 32 meq/L — ABNORMAL HIGH (ref 22–29)
Calcium: 9.4 mg/dL (ref 8.4–10.4)
Chloride: 88 meq/L — ABNORMAL LOW (ref 98–109)
Creatinine: 1 mg/dL (ref 0.7–1.3)
EGFR: 77 ml/min/1.73 m2 — ABNORMAL LOW
Glucose: 93 mg/dL (ref 70–140)
Potassium: 3.4 meq/L — ABNORMAL LOW (ref 3.5–5.1)
Sodium: 130 meq/L — ABNORMAL LOW (ref 136–145)
Total Bilirubin: 0.53 mg/dL (ref 0.20–1.20)
Total Protein: 7.7 g/dL (ref 6.4–8.3)

## 2017-05-13 LAB — CBC WITH DIFFERENTIAL/PLATELET
BASO%: 0.6 % (ref 0.0–2.0)
BASOS ABS: 0 10*3/uL (ref 0.0–0.1)
EOS ABS: 0.1 10*3/uL (ref 0.0–0.5)
EOS%: 2.1 % (ref 0.0–7.0)
HEMATOCRIT: 39.1 % (ref 38.4–49.9)
HEMOGLOBIN: 13.2 g/dL (ref 13.0–17.1)
LYMPH#: 1.1 10*3/uL (ref 0.9–3.3)
LYMPH%: 17.2 % (ref 14.0–49.0)
MCH: 31.9 pg (ref 27.2–33.4)
MCHC: 33.8 g/dL (ref 32.0–36.0)
MCV: 94.4 fL (ref 79.3–98.0)
MONO#: 0.4 10*3/uL (ref 0.1–0.9)
MONO%: 5.7 % (ref 0.0–14.0)
NEUT#: 4.7 10*3/uL (ref 1.5–6.5)
NEUT%: 74.4 % (ref 39.0–75.0)
PLATELETS: 248 10*3/uL (ref 140–400)
RBC: 4.14 10*6/uL — ABNORMAL LOW (ref 4.20–5.82)
RDW: 15.5 % — ABNORMAL HIGH (ref 11.0–14.6)
WBC: 6.3 10*3/uL (ref 4.0–10.3)

## 2017-05-13 LAB — MAGNESIUM: Magnesium: 1.9 mg/dL (ref 1.5–2.5)

## 2017-05-13 MED ORDER — SODIUM CHLORIDE 0.9% FLUSH
10.0000 mL | INTRAVENOUS | Status: DC | PRN
  Administered 2017-05-13: 10 mL
  Filled 2017-05-13: qty 10

## 2017-05-13 MED ORDER — DOXYCYCLINE HYCLATE 100 MG PO TABS: 100.0000 mg | ORAL_TABLET | Freq: Two times a day (BID) | ORAL | 0 refills | Status: DC

## 2017-05-13 MED ORDER — HEPARIN SOD (PORK) LOCK FLUSH 100 UNIT/ML IV SOLN
500.0000 [IU] | Freq: Once | INTRAVENOUS | Status: AC | PRN
  Administered 2017-05-13: 500 [IU]
  Filled 2017-05-13: qty 5

## 2017-05-13 MED ORDER — SODIUM CHLORIDE 0.9 % IV SOLN
Freq: Once | INTRAVENOUS | Status: AC
  Administered 2017-05-13: 10:00:00 via INTRAVENOUS

## 2017-05-13 MED FILL — DOXYCYCLINE HYC 100 MG TAB: 100 | 7 days supply | Qty: 14 | Fill #0

## 2017-05-13 NOTE — Progress Notes (Signed)
Pt stating that he has completed his antibiotics for his eye/face infection but his sclera remains quite red and inflamed. Discussed with Dr. Alvy Bimler and prescription for doxycycline called in to [facility] per order from Dr. Alvy Bimler.  Pt and his wife made aware and they will pick this up after he leaves here today.

## 2017-05-13 NOTE — Progress Notes (Signed)
Oncology Nurse Navigator Documentation  Met with [name] in [facility] to address reported concerns re PEG.  His wife was chairside. He indicated gravity instillation not working, "half to press with plunger, still doesn't go in". I assessed PEG, externally appeared WNL Attached syringe, instilled ca 50 cc water by gravity.  Drainage WNL considering ID of narrow tube. He indicated supplement doesn't drain, I encourage dilution with water. They voiced understanding of guidance, understand I am available as needed.  Gayleen Orem, RN, BSN, [place] Oncology Nurse [place] [facility] [place] [phone]

## 2017-05-13 NOTE — Patient Instructions (Signed)
Dehydration, Adult Dehydration is a condition in which there is not enough fluid or water in the body. This happens when you lose more fluids than you take in. Important organs, such as the kidneys, brain, and heart, cannot function without a proper amount of fluids. Any loss of fluids from the body can lead to dehydration. Dehydration can range from mild to severe. This condition should be treated right away to prevent it from becoming severe. What are the causes? This condition may be caused by:  Vomiting.  Diarrhea.  Excessive sweating, such as from heat exposure or exercise.  Not drinking enough fluid, especially: ? When ill. ? While doing activity that requires a lot of energy.  Excessive urination.  Fever.  Infection.  Certain medicines, such as medicines that cause the body to lose excess fluid (diuretics).  Inability to access safe drinking water.  Reduced physical ability to get adequate water and food.  What increases the risk? This condition is more likely to develop in people:  Who have a poorly controlled long-term (chronic) illness, such as diabetes, heart disease, or kidney disease.  Who are age 65 or older.  Who are disabled.  Who live in a place with high altitude.  Who play endurance sports.  What are the signs or symptoms? Symptoms of mild dehydration may include:  Thirst.  Dry lips.  Slightly dry mouth.  Dry, warm skin.  Dizziness. Symptoms of moderate dehydration may include:  Very dry mouth.  Muscle cramps.  Dark urine. Urine may be the color of tea.  Decreased urine production.  Decreased tear production.  Heartbeat that is irregular or faster than normal (palpitations).  Headache.  Light-headedness, especially when you stand up from a sitting position.  Fainting (syncope). Symptoms of severe dehydration may include:  Changes in skin, such as: ? Cold and clammy skin. ? Blotchy (mottled) or pale skin. ? Skin that does  not quickly return to normal after being lightly pinched and released (poor skin turgor).  Changes in body fluids, such as: ? Extreme thirst. ? No tear production. ? Inability to sweat when body temperature is high, such as in hot weather. ? Very little urine production.  Changes in vital signs, such as: ? Weak pulse. ? Pulse that is more than 100 beats a minute when sitting still. ? Rapid breathing. ? Low blood pressure.  Other changes, such as: ? Sunken eyes. ? Cold hands and feet. ? Confusion. ? Lack of energy (lethargy). ? Difficulty waking up from sleep. ? Short-term weight loss. ? Unconsciousness. How is this diagnosed? This condition is diagnosed based on your symptoms and a physical exam. Blood and urine tests may be done to help confirm the diagnosis. How is this treated? Treatment for this condition depends on the severity. Mild or moderate dehydration can often be treated at home. Treatment should be started right away. Do not wait until dehydration becomes severe. Severe dehydration is an emergency and it needs to be treated in a hospital. Treatment for mild dehydration may include:  Drinking more fluids.  Replacing salts and minerals in your blood (electrolytes) that you may have lost. Treatment for moderate dehydration may include:  Drinking an oral rehydration solution (ORS). This is a drink that helps you replace fluids and electrolytes (rehydrate). It can be found at pharmacies and retail stores. Treatment for severe dehydration may include:  Receiving fluids through an IV tube.  Receiving an electrolyte solution through a feeding tube that is passed through your nose   and into your stomach (nasogastric tube, or NG tube).  Correcting any abnormalities in electrolytes.  Treating the underlying cause of dehydration. Follow these instructions at home:  If directed by your health care provider, drink an ORS: ? Make an ORS by following instructions on the  package. ? Start by drinking small amounts, about  cup (120 mL) every 5-10 minutes. ? Slowly increase how much you drink until you have taken the amount recommended by your health care provider.  Drink enough clear fluid to keep your urine clear or pale yellow. If you were told to drink an ORS, finish the ORS first, then start slowly drinking other clear fluids. Drink fluids such as: ? Water. Do not drink only water. Doing that can lead to having too little salt (sodium) in the body (hyponatremia). ? Ice chips. ? Fruit juice that you have added water to (diluted fruit juice). ? Low-calorie sports drinks.  Avoid: ? Alcohol. ? Drinks that contain a lot of sugar. These include high-calorie sports drinks, fruit juice that is not diluted, and soda. ? Caffeine. ? Foods that are greasy or contain a lot of fat or sugar.  Take over-the-counter and prescription medicines only as told by your health care provider.  Do not take sodium tablets. This can lead to having too much sodium in the body (hypernatremia).  Eat foods that contain a healthy balance of electrolytes, such as bananas, oranges, potatoes, tomatoes, and spinach.  Keep all follow-up visits as told by your health care provider. This is important. Contact a health care provider if:  You have abdominal pain that: ? Gets worse. ? Stays in one area (localizes).  You have a rash.  You have a stiff neck.  You are more irritable than usual.  You are sleepier or more difficult to wake up than usual.  You feel weak or dizzy.  You feel very thirsty.  You have urinated only a small amount of very dark urine over 6-8 hours. Get help right away if:  You have symptoms of severe dehydration.  You cannot drink fluids without vomiting.  Your symptoms get worse with treatment.  You have a fever.  You have a severe headache.  You have vomiting or diarrhea that: ? Gets worse. ? Does not go away.  You have blood or green matter  (bile) in your vomit.  You have blood in your stool. This may cause stool to look black and tarry.  You have not urinated in 6-8 hours.  You faint.  Your heart rate while sitting still is over 100 beats a minute.  You have trouble breathing. This information is not intended to replace advice given to you by your health care provider. Make sure you discuss any questions you have with your health care provider. Document Released: 11/19/2005 Document Revised: 06/15/2016 Document Reviewed: 01/13/2016 Elsevier Interactive Patient Education  2018 [employer]  

## 2017-05-14 ENCOUNTER — Encounter: Payer: Self-pay | Admitting: Radiation Oncology

## 2017-05-14 ENCOUNTER — Ambulatory Visit (HOSPITAL_BASED_OUTPATIENT_CLINIC_OR_DEPARTMENT_OTHER): Payer: Medicare PPO

## 2017-05-14 VITALS — BP 127/79 | HR 59 | Temp 97.9°F | Resp 18 | Ht 68.0 in | Wt 140.1 lb

## 2017-05-14 DIAGNOSIS — E860 Dehydration: Secondary | ICD-10-CM

## 2017-05-14 DIAGNOSIS — Z95828 Presence of other vascular implants and grafts: Secondary | ICD-10-CM

## 2017-05-14 DIAGNOSIS — C021 Malignant neoplasm of border of tongue: Secondary | ICD-10-CM | POA: Diagnosis not present

## 2017-05-14 MED ORDER — SODIUM CHLORIDE 0.9 % IV SOLN
Freq: Once | INTRAVENOUS | Status: AC
  Administered 2017-05-14: 09:00:00 via INTRAVENOUS

## 2017-05-14 MED ORDER — SODIUM CHLORIDE 0.9% FLUSH
10.0000 mL | INTRAVENOUS | Status: DC | PRN
  Administered 2017-05-14: 10 mL
  Filled 2017-05-14: qty 10

## 2017-05-14 MED ORDER — HEPARIN SOD (PORK) LOCK FLUSH 100 UNIT/ML IV SOLN
500.0000 [IU] | Freq: Once | INTRAVENOUS | Status: AC | PRN
  Administered 2017-05-14: 500 [IU]
  Filled 2017-05-14: qty 5

## 2017-05-14 NOTE — Patient Instructions (Signed)
Dehydration, Adult Dehydration is a condition in which there is not enough fluid or water in the body. This happens when you lose more fluids than you take in. Important organs, such as the kidneys, brain, and heart, cannot function without a proper amount of fluids. Any loss of fluids from the body can lead to dehydration. Dehydration can range from mild to severe. This condition should be treated right away to prevent it from becoming severe. What are the causes? This condition may be caused by:  Vomiting.  Diarrhea.  Excessive sweating, such as from heat exposure or exercise.  Not drinking enough fluid, especially: ? When ill. ? While doing activity that requires a lot of energy.  Excessive urination.  Fever.  Infection.  Certain medicines, such as medicines that cause the body to lose excess fluid (diuretics).  Inability to access safe drinking water.  Reduced physical ability to get adequate water and food.  What increases the risk? This condition is more likely to develop in people:  Who have a poorly controlled long-term (chronic) illness, such as diabetes, heart disease, or kidney disease.  Who are age 65 or older.  Who are disabled.  Who live in a place with high altitude.  Who play endurance sports.  What are the signs or symptoms? Symptoms of mild dehydration may include:  Thirst.  Dry lips.  Slightly dry mouth.  Dry, warm skin.  Dizziness. Symptoms of moderate dehydration may include:  Very dry mouth.  Muscle cramps.  Dark urine. Urine may be the color of tea.  Decreased urine production.  Decreased tear production.  Heartbeat that is irregular or faster than normal (palpitations).  Headache.  Light-headedness, especially when you stand up from a sitting position.  Fainting (syncope). Symptoms of severe dehydration may include:  Changes in skin, such as: ? Cold and clammy skin. ? Blotchy (mottled) or pale skin. ? Skin that does  not quickly return to normal after being lightly pinched and released (poor skin turgor).  Changes in body fluids, such as: ? Extreme thirst. ? No tear production. ? Inability to sweat when body temperature is high, such as in hot weather. ? Very little urine production.  Changes in vital signs, such as: ? Weak pulse. ? Pulse that is more than 100 beats a minute when sitting still. ? Rapid breathing. ? Low blood pressure.  Other changes, such as: ? Sunken eyes. ? Cold hands and feet. ? Confusion. ? Lack of energy (lethargy). ? Difficulty waking up from sleep. ? Short-term weight loss. ? Unconsciousness. How is this diagnosed? This condition is diagnosed based on your symptoms and a physical exam. Blood and urine tests may be done to help confirm the diagnosis. How is this treated? Treatment for this condition depends on the severity. Mild or moderate dehydration can often be treated at home. Treatment should be started right away. Do not wait until dehydration becomes severe. Severe dehydration is an emergency and it needs to be treated in a hospital. Treatment for mild dehydration may include:  Drinking more fluids.  Replacing salts and minerals in your blood (electrolytes) that you may have lost. Treatment for moderate dehydration may include:  Drinking an oral rehydration solution (ORS). This is a drink that helps you replace fluids and electrolytes (rehydrate). It can be found at pharmacies and retail stores. Treatment for severe dehydration may include:  Receiving fluids through an IV tube.  Receiving an electrolyte solution through a feeding tube that is passed through your nose   and into your stomach (nasogastric tube, or NG tube).  Correcting any abnormalities in electrolytes.  Treating the underlying cause of dehydration. Follow these instructions at home:  If directed by your health care provider, drink an ORS: ? Make an ORS by following instructions on the  package. ? Start by drinking small amounts, about  cup (120 mL) every 5-10 minutes. ? Slowly increase how much you drink until you have taken the amount recommended by your health care provider.  Drink enough clear fluid to keep your urine clear or pale yellow. If you were told to drink an ORS, finish the ORS first, then start slowly drinking other clear fluids. Drink fluids such as: ? Water. Do not drink only water. Doing that can lead to having too little salt (sodium) in the body (hyponatremia). ? Ice chips. ? Fruit juice that you have added water to (diluted fruit juice). ? Low-calorie sports drinks.  Avoid: ? Alcohol. ? Drinks that contain a lot of sugar. These include high-calorie sports drinks, fruit juice that is not diluted, and soda. ? Caffeine. ? Foods that are greasy or contain a lot of fat or sugar.  Take over-the-counter and prescription medicines only as told by your health care provider.  Do not take sodium tablets. This can lead to having too much sodium in the body (hypernatremia).  Eat foods that contain a healthy balance of electrolytes, such as bananas, oranges, potatoes, tomatoes, and spinach.  Keep all follow-up visits as told by your health care provider. This is important. Contact a health care provider if:  You have abdominal pain that: ? Gets worse. ? Stays in one area (localizes).  You have a rash.  You have a stiff neck.  You are more irritable than usual.  You are sleepier or more difficult to wake up than usual.  You feel weak or dizzy.  You feel very thirsty.  You have urinated only a small amount of very dark urine over 6-8 hours. Get help right away if:  You have symptoms of severe dehydration.  You cannot drink fluids without vomiting.  Your symptoms get worse with treatment.  You have a fever.  You have a severe headache.  You have vomiting or diarrhea that: ? Gets worse. ? Does not go away.  You have blood or green matter  (bile) in your vomit.  You have blood in your stool. This may cause stool to look black and tarry.  You have not urinated in 6-8 hours.  You faint.  Your heart rate while sitting still is over 100 beats a minute.  You have trouble breathing. This information is not intended to replace advice given to you by your health care provider. Make sure you discuss any questions you have with your health care provider. Document Released: 11/19/2005 Document Revised: 06/15/2016 Document Reviewed: 01/13/2016 Elsevier Interactive Patient Education  2018 [employer]  

## 2017-05-15 ENCOUNTER — Telehealth: Payer: Self-pay | Admitting: Hematology and Oncology

## 2017-05-15 ENCOUNTER — Ambulatory Visit (HOSPITAL_BASED_OUTPATIENT_CLINIC_OR_DEPARTMENT_OTHER): Payer: Medicare PPO

## 2017-05-15 ENCOUNTER — Ambulatory Visit (HOSPITAL_BASED_OUTPATIENT_CLINIC_OR_DEPARTMENT_OTHER): Payer: Medicare PPO | Admitting: Hematology and Oncology

## 2017-05-15 VITALS — BP 133/84 | HR 110 | Temp 97.7°F | Resp 18 | Ht 68.0 in | Wt 140.6 lb

## 2017-05-15 DIAGNOSIS — C021 Malignant neoplasm of border of tongue: Secondary | ICD-10-CM

## 2017-05-15 DIAGNOSIS — K1231 Oral mucositis (ulcerative) due to antineoplastic therapy: Secondary | ICD-10-CM

## 2017-05-15 DIAGNOSIS — E876 Hypokalemia: Secondary | ICD-10-CM

## 2017-05-15 DIAGNOSIS — B029 Zoster without complications: Secondary | ICD-10-CM

## 2017-05-15 DIAGNOSIS — Z95828 Presence of other vascular implants and grafts: Secondary | ICD-10-CM

## 2017-05-15 MED ORDER — SODIUM CHLORIDE 0.9 % IV SOLN
Freq: Once | INTRAVENOUS | Status: AC
  Administered 2017-05-15: 10:00:00 via INTRAVENOUS

## 2017-05-15 MED ORDER — SODIUM CHLORIDE 0.9% FLUSH
10.0000 mL | INTRAVENOUS | Status: DC | PRN
Start: 2017-05-15 — End: 2017-05-15
  Administered 2017-05-15: 10 mL
  Filled 2017-05-15: qty 10

## 2017-05-15 MED ORDER — HEPARIN SOD (PORK) LOCK FLUSH 100 UNIT/ML IV SOLN
500.0000 [IU] | Freq: Once | INTRAVENOUS | Status: AC | PRN
Start: 2017-05-15 — End: 2017-05-15
  Administered 2017-05-15: 500 [IU]
  Filled 2017-05-15: qty 5

## 2017-05-15 MED ORDER — VALACYCLOVIR HCL 1 G PO TABS: 1000.0000 mg | ORAL_TABLET | Freq: Three times a day (TID) | ORAL | 0 refills | Status: DC

## 2017-05-15 MED ORDER — MORPHINE SULFATE (CONCENTRATE) 20 MG/ML PO SOLN: 20.0000 mg | ORAL | 0 refills | Status: DC | PRN

## 2017-05-15 MED FILL — valACYclovir HCL 1 GM TABS: 1 | 7 days supply | Qty: 21 | Fill #0

## 2017-05-15 NOTE — Progress Notes (Signed)
Radiation Oncology         [phone] ________________________________  Name: [name] MRN: [id number]  Date: 05/17/2017  DOB: [birth year]  Follow-Up Visit Note  CC: Eloise Levels, MD  Reva Bores, MD  Diagnosis and Prior Radiotherapy:       ICD-10-CM   1. 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 He is accompanied by his wife today.   On review of systems, the patient reports pain to his right eye from recurrent eye infection. He did previously have shingles to his right forehead area; Dr. Alvy Bimler has been managing this. He reports he is instilling 5 cans of Osmolite daily, though some days he does not feel as well and does not instill that amount. He is able to drink liquids orally, but cannot eat solid food as it gets stuck. He reports feeling lightheaded in the morning and occasionally throughout the day.   He denies using tobacco products. He is not using daily fluoride trays. The patient has been getting daily IV fluids and will continue with this through tomorrow. Getting IV fluids M/W/F through 05/31/17.            The patient and his wife report that his liquid antiviral medication is prohibitively expensive, and they question if there are any other options  - currently crushing pills instead. Additionally, the patient has not been able to fill his morphine prescription due to cost.           ALLERGIES:  is allergic  to latex; adhesive [tape]; and codeine.  Meds: Current Outpatient Prescriptions  Medication Sig Dispense Refill  . amoxicillin (AMOXIL) 250 MG/5ML suspension Take 10 mLs (500 mg total) by mouth 3 (three) times daily. 150 mL 0  . aspirin 81 MG chewable tablet Chew 81 mg by mouth every morning.     Marland Kitchen dexamethasone (DECADRON) 4 MG tablet     . doxycycline (VIBRA-TABS) 100 MG tablet Take 1 tablet (100 mg total) by mouth 2 (two) times daily. 14 tablet 0  . fentaNYL (DURAGESIC - DOSED MCG/HR) 12 MCG/HR Place 3 patches (37.5 mcg total) onto the skin every 3 (three) days. 10 patch 0  . lidocaine-prilocaine (EMLA) cream Apply to affected area once 30 g 3  . magnesium oxide (MAG-OX) 400 (241.3 Mg) MG tablet Take 1 tablet (400 mg total) by mouth 2 (two) times daily. 60 tablet 9  . morphine (ROXANOL) 20 MG/ML concentrated solution Take 1 mL (20 mg total) by mouth every 2 (two) hours as needed for severe pain. 240 mL 0  . scopolamine (TRANSDERM-SCOP) 1 MG/3DAYS Place 1 patch (1.5 mg total) onto the skin every 3 (three) days. 10 patch 12  . tobramycin (TOBREX) 0.3 % ophthalmic solution Place 1 drop into the right eye every 4 (four) hours. 5 mL 0  . acyclovir (ZOVIRAX) 200 MG/5ML suspension 800 mg via PEG tube, 5 times a day for  1 week 473 mL 0  . magic mouthwash w/lidocaine SOLN Take 5 mLs by mouth 4 (four) times daily as needed for mouth pain. (Patient not taking: Reported on 03/28/2017) 240 mL 0  . ondansetron (ZOFRAN) 8 MG tablet Take 1 tablet (8 mg total) by mouth every 8 (eight) hours as needed. Start on the third day after chemotherapy. (Patient not taking: Reported on 03/21/2017) 30 tablet 1  . prochlorperazine (COMPAZINE) 10 MG tablet Take 1 tablet (10 mg total) by mouth every 6 (six) hours as needed (Nausea or vomiting). (Patient not taking: Reported on 03/21/2017) 30 tablet 1   No current facility-administered medications for this encounter.    Facility-Administered Medications Ordered in Other  Encounters  Medication Dose Route Frequency Provider Last Rate Last Dose  . heparin lock flush 100 unit/mL  500 Units Intracatheter Once PRN Alvy Bimler, Ni, MD      . sodium chloride flush (NS) 0.9 % injection 10 mL  10 mL Intracatheter PRN Heath Lark, MD        Physical Findings: The patient is in no acute distress. Patient is alert and oriented. Wt Readings from Last 3 Encounters:  05/17/17 142 lb (64.4 kg)  05/16/17 140 lb 12.8 oz (63.9 kg)  05/15/17 140 lb 9.6 oz (63.8 kg)    height is 5\' 8"  (1.727 m) and weight is 142 lb (64.4 kg). His blood pressure is 138/93 (abnormal) and his pulse is 74. His oxygen saturation is 98%.  General: Alert and oriented, in no acute distress. HEENT: Healing well. Mucous membranes slightly dry. Mucositis is almost entirely resolved. No thrush. Scabbed rash on right forehead with scleral irritation attributed to shingles.   Neck: Skin is still dry and flaking over the neck. Some moderate lymphedema throughout the neck. Psychiatric: Judgment and insight are intact. Affect is appropriate.   Lab Findings: Lab Results  Component Value Date   WBC 6.3 05/13/2017   HGB 13.2 05/13/2017   HCT 39.1 05/13/2017   MCV 94.4 05/13/2017   PLT 248 05/13/2017    Lab Results  Component Value Date   TSH 0.495 04/10/2017    Radiographic Findings: Ct Abdomen Wo Contrast  Result Date: 04/19/2017 CLINICAL DATA:  Pain at gastrostomy tube site. EXAM: CT ABDOMEN WITHOUT CONTRAST TECHNIQUE: Multidetector CT imaging of the abdomen was performed following the standard protocol without IV contrast. COMPARISON:  CT scan 02/02/2014 FINDINGS: Lower chest: The lung bases are clear of acute process. Minimal dependent subpleural bibasilar atelectasis. No worrisome pulmonary lesions. Three-vessel coronary artery calcifications are noted. Hepatobiliary: A few small scattered hepatic cysts are noted. No worrisome hepatic lesions or intrahepatic biliary dilatation. The gallbladder is  surgically absent. No common bile duct dilatation. Pancreas: No mass, inflammation or ductal dilatation. Small duodenum diverticulum noted near the pancreatic head. Spleen: Normal size.  No focal lesions. Adrenals/Urinary Tract: Stable left adrenal gland nodule consistent with benign adenoma. The right adrenal gland is normal. No renal mass or renal calculi. Stomach/Bowel: The stomach, duodenum, visualized small bowel and visualize colon are unremarkable. The gastrostomy tube is in good position. No complicating features are identified. Vascular/Lymphatic: Atherosclerotic calcifications involving the aorta and branch vessels. There is a small saccular aneurysm noted along the medial aspect of the proximal right common iliac artery measuring 18.5 x 12.5 mm. No mesenteric or retroperitoneal mass or adenopathy. Small scattered lymph nodes are noted. Other: No ascites or abdominal wall hernia. Evidence of prior hernia repair with anterior abdominal wall mesh. Musculoskeletal: No significant bony findings.  IMPRESSION: 1. The gastrostomy tube is in good position and I do not identify any complicating features associated with it. 2. No acute abdominal findings, mass lesions or adenopathy. 3. Small left adrenal gland adenoma. 4. Advanced atherosclerotic calcifications involving the aorta and branch vessels. Three-vessel coronary artery calcifications are noted. 5. 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 If the Electronically Signed   By: Marijo Sanes M.D.   On: 04/19/2017 13:19    Impression/Plan:    1) Head and Neck Cancer Status: NED on clinical exam today  2) Nutritional Status: stable PEG tube: Instilling 6 Osmolite daily  3) Risk Factors: The patient has been educated about risk factors including alcohol and tobacco abuse; they understand that avoidance of alcohol and tobacco is important to prevent recurrences as well as other cancers.  4) Swallowing: Only able to  swallow liquids at this time. .. still follows with [facility].  5)  Thyroid function: Recheck in next appt. Lab Results  Component Value Date   TSH 0.495 04/10/2017    6) Other: Referral to financial advocates to discuss potential [place] aid in covering prescription costs. The patient's wife will meet to discuss this today as the patient must present to infusion for IV fluids.  Patient will begin to use Vitamin E lotion to the skin in all treatment fields to aid in skin healing. He should do this 3 times a day for the next 2-3 months. The patient is in agreement.  7) Follow-up in 2.5  months with repeat CT scans prior to visit. The patient was encouraged to call with any issues or questions before then.    Eppie Gibson, MD  This document serves as a record of services personally performed by Eppie Gibson, MD. It was created on her behalf by Maryla Morrow, a trained medical scribe. The creation of this record is based on the scribe's personal observations and the provider's statements to them. This document has been checked and approved by the attending provider.

## 2017-05-15 NOTE — Patient Instructions (Signed)
Dehydration, Adult Dehydration is a condition in which there is not enough fluid or water in the body. This happens when you lose more fluids than you take in. Important organs, such as the kidneys, brain, and heart, cannot function without a proper amount of fluids. Any loss of fluids from the body can lead to dehydration. Dehydration can range from mild to severe. This condition should be treated right away to prevent it from becoming severe. What are the causes? This condition may be caused by:  Vomiting.  Diarrhea.  Excessive sweating, such as from heat exposure or exercise.  Not drinking enough fluid, especially: ? When ill. ? While doing activity that requires a lot of energy.  Excessive urination.  Fever.  Infection.  Certain medicines, such as medicines that cause the body to lose excess fluid (diuretics).  Inability to access safe drinking water.  Reduced physical ability to get adequate water and food.  What increases the risk? This condition is more likely to develop in people:  Who have a poorly controlled long-term (chronic) illness, such as diabetes, heart disease, or kidney disease.  Who are age 65 or older.  Who are disabled.  Who live in a place with high altitude.  Who play endurance sports.  What are the signs or symptoms? Symptoms of mild dehydration may include:  Thirst.  Dry lips.  Slightly dry mouth.  Dry, warm skin.  Dizziness. Symptoms of moderate dehydration may include:  Very dry mouth.  Muscle cramps.  Dark urine. Urine may be the color of tea.  Decreased urine production.  Decreased tear production.  Heartbeat that is irregular or faster than normal (palpitations).  Headache.  Light-headedness, especially when you stand up from a sitting position.  Fainting (syncope). Symptoms of severe dehydration may include:  Changes in skin, such as: ? Cold and clammy skin. ? Blotchy (mottled) or pale skin. ? Skin that does  not quickly return to normal after being lightly pinched and released (poor skin turgor).  Changes in body fluids, such as: ? Extreme thirst. ? No tear production. ? Inability to sweat when body temperature is high, such as in hot weather. ? Very little urine production.  Changes in vital signs, such as: ? Weak pulse. ? Pulse that is more than 100 beats a minute when sitting still. ? Rapid breathing. ? Low blood pressure.  Other changes, such as: ? Sunken eyes. ? Cold hands and feet. ? Confusion. ? Lack of energy (lethargy). ? Difficulty waking up from sleep. ? Short-term weight loss. ? Unconsciousness. How is this diagnosed? This condition is diagnosed based on your symptoms and a physical exam. Blood and urine tests may be done to help confirm the diagnosis. How is this treated? Treatment for this condition depends on the severity. Mild or moderate dehydration can often be treated at home. Treatment should be started right away. Do not wait until dehydration becomes severe. Severe dehydration is an emergency and it needs to be treated in a hospital. Treatment for mild dehydration may include:  Drinking more fluids.  Replacing salts and minerals in your blood (electrolytes) that you may have lost. Treatment for moderate dehydration may include:  Drinking an oral rehydration solution (ORS). This is a drink that helps you replace fluids and electrolytes (rehydrate). It can be found at pharmacies and retail stores. Treatment for severe dehydration may include:  Receiving fluids through an IV tube.  Receiving an electrolyte solution through a feeding tube that is passed through your nose   and into your stomach (nasogastric tube, or NG tube).  Correcting any abnormalities in electrolytes.  Treating the underlying cause of dehydration. Follow these instructions at home:  If directed by your health care provider, drink an ORS: ? Make an ORS by following instructions on the  package. ? Start by drinking small amounts, about  cup (120 mL) every 5-10 minutes. ? Slowly increase how much you drink until you have taken the amount recommended by your health care provider.  Drink enough clear fluid to keep your urine clear or pale yellow. If you were told to drink an ORS, finish the ORS first, then start slowly drinking other clear fluids. Drink fluids such as: ? Water. Do not drink only water. Doing that can lead to having too little salt (sodium) in the body (hyponatremia). ? Ice chips. ? Fruit juice that you have added water to (diluted fruit juice). ? Low-calorie sports drinks.  Avoid: ? Alcohol. ? Drinks that contain a lot of sugar. These include high-calorie sports drinks, fruit juice that is not diluted, and soda. ? Caffeine. ? Foods that are greasy or contain a lot of fat or sugar.  Take over-the-counter and prescription medicines only as told by your health care provider.  Do not take sodium tablets. This can lead to having too much sodium in the body (hypernatremia).  Eat foods that contain a healthy balance of electrolytes, such as bananas, oranges, potatoes, tomatoes, and spinach.  Keep all follow-up visits as told by your health care provider. This is important. Contact a health care provider if:  You have abdominal pain that: ? Gets worse. ? Stays in one area (localizes).  You have a rash.  You have a stiff neck.  You are more irritable than usual.  You are sleepier or more difficult to wake up than usual.  You feel weak or dizzy.  You feel very thirsty.  You have urinated only a small amount of very dark urine over 6-8 hours. Get help right away if:  You have symptoms of severe dehydration.  You cannot drink fluids without vomiting.  Your symptoms get worse with treatment.  You have a fever.  You have a severe headache.  You have vomiting or diarrhea that: ? Gets worse. ? Does not go away.  You have blood or green matter  (bile) in your vomit.  You have blood in your stool. This may cause stool to look black and tarry.  You have not urinated in 6-8 hours.  You faint.  Your heart rate while sitting still is over 100 beats a minute.  You have trouble breathing. This information is not intended to replace advice given to you by your health care provider. Make sure you discuss any questions you have with your health care provider. Document Released: 11/19/2005 Document Revised: 06/15/2016 Document Reviewed: 01/13/2016 Elsevier Interactive Patient Education  2018 [employer]  

## 2017-05-15 NOTE — Telephone Encounter (Signed)
Scheduled appt per 6/13 los - Gave patient AVS and calender per LOS .

## 2017-05-16 ENCOUNTER — Encounter: Payer: Self-pay | Admitting: Hematology and Oncology

## 2017-05-16 ENCOUNTER — Other Ambulatory Visit: Payer: Self-pay | Admitting: Hematology and Oncology

## 2017-05-16 ENCOUNTER — Ambulatory Visit (HOSPITAL_BASED_OUTPATIENT_CLINIC_OR_DEPARTMENT_OTHER): Payer: Medicare PPO

## 2017-05-16 VITALS — BP 151/85 | HR 76 | Temp 98.0°F | Resp 18 | Ht 68.0 in | Wt 140.8 lb

## 2017-05-16 DIAGNOSIS — Z95828 Presence of other vascular implants and grafts: Secondary | ICD-10-CM

## 2017-05-16 DIAGNOSIS — E860 Dehydration: Secondary | ICD-10-CM | POA: Diagnosis not present

## 2017-05-16 DIAGNOSIS — C021 Malignant neoplasm of border of tongue: Secondary | ICD-10-CM | POA: Diagnosis not present

## 2017-05-16 DIAGNOSIS — B029 Zoster without complications: Secondary | ICD-10-CM | POA: Insufficient documentation

## 2017-05-16 MED ORDER — SODIUM CHLORIDE 0.9% FLUSH
10.0000 mL | INTRAVENOUS | Status: DC | PRN
  Administered 2017-05-16: 10 mL
  Filled 2017-05-16: qty 10

## 2017-05-16 MED ORDER — SODIUM CHLORIDE 0.9 % IV SOLN
Freq: Once | INTRAVENOUS | Status: AC
  Administered 2017-05-16: 09:00:00 via INTRAVENOUS

## 2017-05-16 MED ORDER — ACYCLOVIR 200 MG/5ML PO SUSP: ORAL | 0 refills | Status: DC

## 2017-05-16 MED ORDER — HEPARIN SOD (PORK) LOCK FLUSH 100 UNIT/ML IV SOLN
500.0000 [IU] | Freq: Once | INTRAVENOUS | Status: AC | PRN
  Administered 2017-05-16: 500 [IU]
  Filled 2017-05-16: qty 5

## 2017-05-16 NOTE — Progress Notes (Signed)
Pt and wife brought in pill bottle of valtrex. They are too large for pt to swallow and extremely difficult to to crush. They are requesting a liquid form of medicine.  Spoke with Dr. Alvy Bimler and she prescribed Acyclovir suspension to [facility]-this is 5 x a day.  Informed pt and his wife of this. They voiced understanding and will pick this up after his IV fluids are completed today.

## 2017-05-16 NOTE — Assessment & Plan Note (Signed)
He has outbreak of shingles I will prescribe Valtrex I recommend ophthalmology review due to close proximity to his eyes So far, he denies visual changes

## 2017-05-16 NOTE — Patient Instructions (Signed)
Dehydration, Adult Dehydration is a condition in which there is not enough fluid or water in the body. This happens when you lose more fluids than you take in. Important organs, such as the kidneys, brain, and heart, cannot function without a proper amount of fluids. Any loss of fluids from the body can lead to dehydration. Dehydration can range from mild to severe. This condition should be treated right away to prevent it from becoming severe. What are the causes? This condition may be caused by:  Vomiting.  Diarrhea.  Excessive sweating, such as from heat exposure or exercise.  Not drinking enough fluid, especially: ? When ill. ? While doing activity that requires a lot of energy.  Excessive urination.  Fever.  Infection.  Certain medicines, such as medicines that cause the body to lose excess fluid (diuretics).  Inability to access safe drinking water.  Reduced physical ability to get adequate water and food.  What increases the risk? This condition is more likely to develop in people:  Who have a poorly controlled long-term (chronic) illness, such as diabetes, heart disease, or kidney disease.  Who are age 65 or older.  Who are disabled.  Who live in a place with high altitude.  Who play endurance sports.  What are the signs or symptoms? Symptoms of mild dehydration may include:  Thirst.  Dry lips.  Slightly dry mouth.  Dry, warm skin.  Dizziness. Symptoms of moderate dehydration may include:  Very dry mouth.  Muscle cramps.  Dark urine. Urine may be the color of tea.  Decreased urine production.  Decreased tear production.  Heartbeat that is irregular or faster than normal (palpitations).  Headache.  Light-headedness, especially when you stand up from a sitting position.  Fainting (syncope). Symptoms of severe dehydration may include:  Changes in skin, such as: ? Cold and clammy skin. ? Blotchy (mottled) or pale skin. ? Skin that does  not quickly return to normal after being lightly pinched and released (poor skin turgor).  Changes in body fluids, such as: ? Extreme thirst. ? No tear production. ? Inability to sweat when body temperature is high, such as in hot weather. ? Very little urine production.  Changes in vital signs, such as: ? Weak pulse. ? Pulse that is more than 100 beats a minute when sitting still. ? Rapid breathing. ? Low blood pressure.  Other changes, such as: ? Sunken eyes. ? Cold hands and feet. ? Confusion. ? Lack of energy (lethargy). ? Difficulty waking up from sleep. ? Short-term weight loss. ? Unconsciousness. How is this diagnosed? This condition is diagnosed based on your symptoms and a physical exam. Blood and urine tests may be done to help confirm the diagnosis. How is this treated? Treatment for this condition depends on the severity. Mild or moderate dehydration can often be treated at home. Treatment should be started right away. Do not wait until dehydration becomes severe. Severe dehydration is an emergency and it needs to be treated in a hospital. Treatment for mild dehydration may include:  Drinking more fluids.  Replacing salts and minerals in your blood (electrolytes) that you may have lost. Treatment for moderate dehydration may include:  Drinking an oral rehydration solution (ORS). This is a drink that helps you replace fluids and electrolytes (rehydrate). It can be found at pharmacies and retail stores. Treatment for severe dehydration may include:  Receiving fluids through an IV tube.  Receiving an electrolyte solution through a feeding tube that is passed through your nose   and into your stomach (nasogastric tube, or NG tube).  Correcting any abnormalities in electrolytes.  Treating the underlying cause of dehydration. Follow these instructions at home:  If directed by your health care provider, drink an ORS: ? Make an ORS by following instructions on the  package. ? Start by drinking small amounts, about  cup (120 mL) every 5-10 minutes. ? Slowly increase how much you drink until you have taken the amount recommended by your health care provider.  Drink enough clear fluid to keep your urine clear or pale yellow. If you were told to drink an ORS, finish the ORS first, then start slowly drinking other clear fluids. Drink fluids such as: ? Water. Do not drink only water. Doing that can lead to having too little salt (sodium) in the body (hyponatremia). ? Ice chips. ? Fruit juice that you have added water to (diluted fruit juice). ? Low-calorie sports drinks.  Avoid: ? Alcohol. ? Drinks that contain a lot of sugar. These include high-calorie sports drinks, fruit juice that is not diluted, and soda. ? Caffeine. ? Foods that are greasy or contain a lot of fat or sugar.  Take over-the-counter and prescription medicines only as told by your health care provider.  Do not take sodium tablets. This can lead to having too much sodium in the body (hypernatremia).  Eat foods that contain a healthy balance of electrolytes, such as bananas, oranges, potatoes, tomatoes, and spinach.  Keep all follow-up visits as told by your health care provider. This is important. Contact a health care provider if:  You have abdominal pain that: ? Gets worse. ? Stays in one area (localizes).  You have a rash.  You have a stiff neck.  You are more irritable than usual.  You are sleepier or more difficult to wake up than usual.  You feel weak or dizzy.  You feel very thirsty.  You have urinated only a small amount of very dark urine over 6-8 hours. Get help right away if:  You have symptoms of severe dehydration.  You cannot drink fluids without vomiting.  Your symptoms get worse with treatment.  You have a fever.  You have a severe headache.  You have vomiting or diarrhea that: ? Gets worse. ? Does not go away.  You have blood or green matter  (bile) in your vomit.  You have blood in your stool. This may cause stool to look black and tarry.  You have not urinated in 6-8 hours.  You faint.  Your heart rate while sitting still is over 100 beats a minute.  You have trouble breathing. This information is not intended to replace advice given to you by your health care provider. Make sure you discuss any questions you have with your health care provider. Document Released: 11/19/2005 Document Revised: 06/15/2016 Document Reviewed: 01/13/2016 Elsevier Interactive Patient Education  2018 [employer]  

## 2017-05-16 NOTE — Assessment & Plan Note (Signed)
He has recurrent hypokalemia He will continue replacement therapy

## 2017-05-16 NOTE — Progress Notes (Signed)
[facility] OFFICE PROGRESS NOTE  Patient Care Team: Eloise Levels, MD as PCP - General  SUMMARY OF ONCOLOGIC HISTORY:   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[facility])   12/07/2016 Initial Diagnosis    He presented to his PCP with complaints of a new onset ulcer under his tongue. This caused him significant discomfort, resulting in decreased food intake.      12/28/2016 Imaging    CT of the neck on 12/28/16 had demonstrate no frank adenopathy in the neck and the tongue was not well visualized die to dental work. On the same day a CT of his chest showed emphysema of the lungs and a few nonspecific pulmonary nodules which were unchanged compared to 06/2015.      01/15/2017 PET scan    This revealed hypermetabolic activity in the left anterior tongue as well as within a small left level 2 node and small nodules in the left parotid gland. No evidence of metastatic disease distantly      01/28/2017 Pathology Results    A.TONGUE, LEFT PARTIAL GLOSSECTOMY: Invasive moderate to poorly-differentiated squamous cell carcinoma, conventional type, 2.5 cm in greatest dimension on gross examination. Invasive squamous cell carcinoma shows greatest depth of invasion of 1.5 cm. Margins uninvolved by invasive tumor (invasive squamous cell carcinoma comes to within 0.6 cm of the inked posterior margin). Negative for lymphovascular invasion Negative for perineural invasion. AJCC Pathologic Stage: pT3 pN3 pM- not applicable. See Cancer Case Summary.  SURGICAL PATHOLOGY CANCER CASE SUMMARY- LIP AND ORAL CAVITY Protocol posting date: June 2017 Procedure: Partial glossectomy Tumor site: Oral, tongue Tumor laterality: left Tumor focality: unifocal Tumor size: Greatest dimension 2.5 cm on gross examination Histologic type: Squamous cell carcinoma, conventional Histologic grade: G2-G3, moderate to poorly differentiated Specimen margins:  Uninvolved by invasive tumor Distance from closest margin: 6 mm (0.6 cm) from posterior margin Lymphovascular invasion: Not identified Perineural invasion: Not identified Regional lymph nodes: Number of lymph nodes involved: 3 Number of lymph nodes examined: 57 Laterality of lymph nodes involved: Bilateral Size of largest metastatic deposit: 9 mm (0.9 cm) Extranodal extension: very focally present in 1 lymph node (part J) Pathologic Stage Classification (pTNM, AJCC 8th Edition) Primary tumor (pT): pT3 Regional lymph nodes (pN): pN3 Distant metastasis (pM): not applicable   B.ANTERIOR DORSAL TONGUE, BIOPSY: Negative for malignancy.  C.POSTERIOR DORSAL TONGUE, BIOPSY: Negative for malignancy.  D.MID DORSAL TONGUE, BIOPSY: Negative for malignancy.  E.DEEP TONGUE, BIOPSY: Negative for malignancy.  F.ANTERIOR FLOOR OF MOUTH, BIOPSY: Negative for malignancy.  G.POSTERIOR FLOOR OF MOUTH, BIOPSY: Negative for malignancy.  H.LEVEL IA, RESECTION: Six benign lymph nodes negative for metastatic carcinoma on routine H&E staining (0/6).  I.LEVEL IB, RESECTION: Two benign lymph nodes negative for metastatic carcinoma on routine H&E staining (0/2). Benign salivary gland tissue.  J.LEFT LEVEL IIA , 3 AND 4, RESECTION: One of twenty-one lymph nodes positive for metastatic carcinoma (0.9 cm focus) with very focal extranodal extension on routine H&E staining (1/21).  K.LEVEL IIB, RESECTION: Four benign lymph nodes negative for metastatic carcinoma on routine H&E staining (0/4). One small focus of benign salivary gland tissue.  L.RIGHT LEVEL IB, RESECTION: Three benign lymph nodes negative for  metastatic carcinoma on routine H&E staining (0/3). Benign salivary gland tissue.  M.RIGHT LEVEL 2A, 3, 4, RESECTION: Two of fifteen lymph nodes positive for metastatic carcinoma (2/15). See comment.  Comment: A small focus suspicious for metastatic carcinoma is seen on the initial H&E stained slide. Therefore, a pankeratin immunohistochemical stain  is performed. The positive control stain functioned appropriately. The pankeratin immunostain highlights a few minute foci of metastatic carcinoma in 2 of the 3 lymph nodes (largest focus 0.2 mm [0.02 cm]).      01/28/2017 Surgery    He had surgery at [facility] <100 SQCM [15100] (SPLIT THICKNESS SKIN GRAFT LEG)           03/01/2017 Procedure    Successful fluoroscopic guided percutaneous gastrostomy tube placement.      03/01/2017 Procedure    Placement of a subcutaneous port device. Catheter tip at the superior cavoatrial junction and ready to be used.      03/12/2017 - 04/25/2017 Radiation Therapy    Received IMRT / 6 MV photons to tongue and bilateral neck / 60 Gy in 30 fractions.      03/13/2017 Procedure    Baseline hearing is near normal      03/15/2017 - 04/12/2017 Chemotherapy    He received cisplatin x 5 weekly doses      04/17/2017 - 04/25/2017 Hospital Admission    He was admitted to the hospital for management of severe mucositis pain, dehydration, dizziness and uncontrolled nausea       INTERVAL HISTORY: Please see below for problem oriented charting. He is seen in the infusion room He is doing well The skin rash was originally thought to be due to cellulitis, not improving on antibiotic treatment. 2 days ago, he started to develop blister He denies pain He denies visual changes His pain in the mouth is under good control. He had no recent nausea or vomiting.  Denies  constipation.  REVIEW OF SYSTEMS:   Constitutional: Denies fevers, chills or abnormal weight loss Eyes: Denies blurriness of vision Respiratory: Denies cough, dyspnea or wheezes Cardiovascular: Denies palpitation, chest discomfort or lower extremity swelling Gastrointestinal:  Denies nausea, heartburn or change in bowel habits Lymphatics: Denies new lymphadenopathy or easy bruising Neurological:Denies numbness, tingling or new weaknesses Behavioral/Psych: Mood is stable, no new changes  All other systems were reviewed with the patient and are negative.  I have reviewed the past medical history, past surgical history, social history and family history with the patient and they are unchanged from previous note.  ALLERGIES:  is allergic to latex; adhesive [tape]; and codeine.  MEDICATIONS:  Current Outpatient Prescriptions  Medication Sig Dispense Refill  . amoxicillin (AMOXIL) 250 MG/5ML suspension Take 10 mLs (500 mg total) by mouth 3 (three) times daily. 150 mL 0  . aspirin 81 MG chewable tablet Chew 81 mg by mouth every morning.     Marland Kitchen dexamethasone (DECADRON) 4 MG tablet     . doxycycline (VIBRA-TABS) 100 MG tablet Take 1 tablet (100 mg total) by mouth 2 (two) times daily. 14 tablet 0  . fentaNYL (DURAGESIC - DOSED MCG/HR) 12 MCG/HR Place 3 patches (37.5 mcg total) onto the skin every 3 (three) days. 10 patch 0  . lidocaine-prilocaine (EMLA) cream Apply to affected area once 30 g 3  . magic mouthwash w/lidocaine SOLN Take 5 mLs by mouth 4 (four) times daily as needed for mouth pain. (Patient not taking: Reported on 03/28/2017) 240 mL 0  . magnesium oxide (MAG-OX) 400 (241.3 Mg) MG tablet Take 1 tablet (400 mg total) by mouth 2 (two) times daily. 60 tablet 9  . morphine (ROXANOL) 20 MG/ML concentrated solution Take 1 mL (20 mg total) by mouth every 2 (two) hours as needed for  severe pain. 240 mL 0  . ondansetron (ZOFRAN) 8 MG tablet Take 1 tablet (8 mg total) by mouth every 8 (eight)  hours as needed. Start on the third day after chemotherapy. (Patient not taking: Reported on 03/21/2017) 30 tablet 1  . prochlorperazine (COMPAZINE) 10 MG tablet Take 1 tablet (10 mg total) by mouth every 6 (six) hours as needed (Nausea or vomiting). (Patient not taking: Reported on 03/21/2017) 30 tablet 1  . scopolamine (TRANSDERM-SCOP) 1 MG/3DAYS Place 1 patch (1.5 mg total) onto the skin every 3 (three) days. 10 patch 12  . tobramycin (TOBREX) 0.3 % ophthalmic solution Place 1 drop into the right eye every 4 (four) hours. 5 mL 0  . valACYclovir (VALTREX) 1000 MG tablet Take 1 tablet (1,000 mg total) by mouth 3 (three) times daily. 21 tablet 0   No current facility-administered medications for this visit.     PHYSICAL EXAMINATION: ECOG PERFORMANCE STATUS: 1 - Symptomatic but completely ambulatory GENERAL:alert, no distress and comfortable SKIN: Noted a rash on his forehead resemble shingles EYES: normal, Conjunctiva are pink and non-injected, sclera clear OROPHARYNX:no exudate, no erythema and lips, buccal mucosa, and tongue normal  NECK: supple, thyroid normal size, non-tender, without nodularity LYMPH:  no palpable lymphadenopathy in the cervical, axillary or inguinal LUNGS: clear to auscultation and percussion with normal breathing effort HEART: regular rate & rhythm and no murmurs and no lower extremity edema ABDOMEN:abdomen soft, non-tender and normal bowel sounds Musculoskeletal:no cyanosis of digits and no clubbing  NEURO: alert & oriented x 3 with fluent speech, no focal motor/sensory deficits  LABORATORY DATA:  I have reviewed the data as listed    Component Value Date/Time   NA 130 (L) 05/13/2017 0912   K 3.4 (L) 05/13/2017 0912   CL 97 (L) 04/23/2017 0430   CO2 32 (H) 05/13/2017 0912   GLUCOSE 93 05/13/2017 0912   BUN 23.7 05/13/2017 0912   CREATININE 1.0 05/13/2017 0912   CALCIUM 9.4 05/13/2017 0912   PROT 7.7 05/13/2017 0912   ALBUMIN 3.3 (L) 05/13/2017 0912   AST 31  05/13/2017 0912   ALT 27 05/13/2017 0912   ALKPHOS 61 05/13/2017 0912   BILITOT 0.53 05/13/2017 0912   GFRNONAA >60 04/23/2017 0430   GFRAA >60 04/23/2017 0430    No results found for: SPEP, UPEP  Lab Results  Component Value Date   WBC 6.3 05/13/2017   NEUTROABS 4.7 05/13/2017   HGB 13.2 05/13/2017   HCT 39.1 05/13/2017   MCV 94.4 05/13/2017   PLT 248 05/13/2017      Chemistry      Component Value Date/Time   NA 130 (L) 05/13/2017 0912   K 3.4 (L) 05/13/2017 0912   CL 97 (L) 04/23/2017 0430   CO2 32 (H) 05/13/2017 0912   BUN 23.7 05/13/2017 0912   CREATININE 1.0 05/13/2017 0912      Component Value Date/Time   CALCIUM 9.4 05/13/2017 0912   ALKPHOS 61 05/13/2017 0912   AST 31 05/13/2017 0912   ALT 27 05/13/2017 0912   BILITOT 0.53 05/13/2017 0912       RADIOGRAPHIC STUDIES: I have personally reviewed the radiological images as listed and agreed with the findings in the report. Ct Abdomen Wo Contrast  Result Date: 04/19/2017 CLINICAL DATA:  Pain at gastrostomy tube site. EXAM: CT ABDOMEN WITHOUT CONTRAST TECHNIQUE: Multidetector CT imaging of the abdomen was performed following the standard protocol without IV contrast. COMPARISON:  CT scan 02/02/2014 FINDINGS: Lower chest: The lung  bases are clear of acute process. Minimal dependent subpleural bibasilar atelectasis. No worrisome pulmonary lesions. Three-vessel coronary artery calcifications are noted. Hepatobiliary: A few small scattered hepatic cysts are noted. No worrisome hepatic lesions or intrahepatic biliary dilatation. The gallbladder is surgically absent. No common bile duct dilatation. Pancreas: No mass, inflammation or ductal dilatation. Small duodenum diverticulum noted near the pancreatic head. Spleen: Normal size.  No focal lesions. Adrenals/Urinary Tract: Stable left adrenal gland nodule consistent with benign adenoma. The right adrenal gland is normal. No renal mass or renal calculi. Stomach/Bowel: The  stomach, duodenum, visualized small bowel and visualize colon are unremarkable. The gastrostomy tube is in good position. No complicating features are identified. Vascular/Lymphatic: Atherosclerotic calcifications involving the aorta and branch vessels. There is a small saccular aneurysm noted along the medial aspect of the proximal right common iliac artery measuring 18.5 x 12.5 mm. No mesenteric or retroperitoneal mass or adenopathy. Small scattered lymph nodes are noted. Other: No ascites or abdominal wall hernia. Evidence of prior hernia repair with anterior abdominal wall mesh. Musculoskeletal: No significant bony findings. IMPRESSION: 1. The gastrostomy tube is in good position and I do not identify any complicating features associated with it. 2. No acute abdominal findings, mass lesions or adenopathy. 3. Small left adrenal gland adenoma. 4. Advanced atherosclerotic calcifications involving the aorta and branch vessels. Three-vessel coronary artery calcifications are noted. 5. 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 If the Electronically Signed   By: Marijo Sanes M.D.   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) He has recently completed treatment but has persistent side effects I will resume IV fluids daily for the next 2 weeks I will see him on a weekly basis for supportive care  Shingles rash He has outbreak of shingles I will prescribe Valtrex I recommend ophthalmology review due to close proximity to his eyes So far, he denies visual changes  Mucositis due to antineoplastic therapy He has persistent mucositis pain His pain is well controlled but he has difficulties with eating, drinking and has persistent nausea He will continue current dose prescription fentanyl patch and I will reassess pain control next week  Hypokalemia He has recurrent hypokalemia He will continue replacement  therapy   No orders of the defined types were placed in this encounter.  All questions were answered. The patient knows to call the clinic with any problems, questions or concerns. No barriers to learning was detected. I spent 15 minutes counseling the patient face to face. The total time spent in the appointment was 20 minutes and more than 50% was on counseling and review of test results     Heath Lark, MD 05/16/2017 8:51 AM

## 2017-05-16 NOTE — Assessment & Plan Note (Signed)
He has recently completed treatment but has persistent side effects I will resume IV fluids daily for the next 2 weeks I will see him on a weekly basis for supportive care

## 2017-05-16 NOTE — Assessment & Plan Note (Signed)
He has persistent mucositis pain His pain is well controlled but he has difficulties with eating, drinking and has persistent nausea He will continue current dose prescription fentanyl patch and I will reassess pain control next week

## 2017-05-17 ENCOUNTER — Ambulatory Visit
Admission: RE | Admit: 2017-05-17 | Discharge: 2017-05-17 | Disposition: A | Discharge status: Home / Self Care | Payer: Medicare PPO | Source: Ambulatory Visit | Attending: Radiation Oncology | Admitting: Radiation Oncology

## 2017-05-17 ENCOUNTER — Ambulatory Visit (HOSPITAL_BASED_OUTPATIENT_CLINIC_OR_DEPARTMENT_OTHER): Payer: Medicare PPO

## 2017-05-17 ENCOUNTER — Ambulatory Visit: Payer: Medicare PPO | Admitting: Nutrition

## 2017-05-17 ENCOUNTER — Encounter: Payer: Self-pay | Admitting: Radiation Oncology

## 2017-05-17 VITALS — BP 138/93 | HR 74 | Ht 68.0 in | Wt 142.0 lb

## 2017-05-17 DIAGNOSIS — Z7982 Long term (current) use of aspirin: Secondary | ICD-10-CM | POA: Insufficient documentation

## 2017-05-17 DIAGNOSIS — Z1329 Encounter for screening for other suspected endocrine disorder: Secondary | ICD-10-CM

## 2017-05-17 DIAGNOSIS — C021 Malignant neoplasm of border of tongue: Secondary | ICD-10-CM

## 2017-05-17 DIAGNOSIS — Z79899 Other long term (current) drug therapy: Secondary | ICD-10-CM | POA: Insufficient documentation

## 2017-05-17 DIAGNOSIS — Z91048 Other nonmedicinal substance allergy status: Secondary | ICD-10-CM | POA: Diagnosis not present

## 2017-05-17 DIAGNOSIS — Z931 Gastrostomy status: Secondary | ICD-10-CM | POA: Insufficient documentation

## 2017-05-17 DIAGNOSIS — Z885 Allergy status to narcotic agent status: Secondary | ICD-10-CM | POA: Insufficient documentation

## 2017-05-17 DIAGNOSIS — E860 Dehydration: Secondary | ICD-10-CM

## 2017-05-17 DIAGNOSIS — R634 Abnormal weight loss: Secondary | ICD-10-CM

## 2017-05-17 DIAGNOSIS — Z95828 Presence of other vascular implants and grafts: Secondary | ICD-10-CM

## 2017-05-17 DIAGNOSIS — Z923 Personal history of irradiation: Secondary | ICD-10-CM | POA: Diagnosis not present

## 2017-05-17 DIAGNOSIS — Z792 Long term (current) use of antibiotics: Secondary | ICD-10-CM | POA: Diagnosis not present

## 2017-05-17 DIAGNOSIS — Z91040 Latex allergy status: Secondary | ICD-10-CM | POA: Diagnosis not present

## 2017-05-17 HISTORY — DX: Personal history of irradiation: Z92.3

## 2017-05-17 MED ORDER — HEPARIN SOD (PORK) LOCK FLUSH 100 UNIT/ML IV SOLN
500.0000 [IU] | Freq: Once | INTRAVENOUS | Status: AC | PRN
  Administered 2017-05-17: 500 [IU]
  Filled 2017-05-17: qty 5

## 2017-05-17 MED ORDER — SODIUM CHLORIDE 0.9% FLUSH
10.0000 mL | INTRAVENOUS | Status: DC | PRN
  Administered 2017-05-17: 10 mL
  Filled 2017-05-17: qty 10

## 2017-05-17 MED ORDER — SODIUM CHLORIDE 0.9 % IV SOLN
Freq: Once | INTRAVENOUS | Status: AC
  Administered 2017-05-17: 13:00:00 via INTRAVENOUS

## 2017-05-17 MED FILL — ACYCLOVIR 200 MG/5 ML SUSP: 200 | 7 days supply | Qty: 700 | Fill #0

## 2017-05-17 MED FILL — MORPHINE SULF 100 MG/5 ML S: 100 | 20 days supply | Qty: 240 | Fill #0

## 2017-05-17 NOTE — Progress Notes (Signed)
Nutrition follow-up completed with patient receiving IV fluids status post treatment for tongue cancer. Weight documented as 142 pounds on June 15 decreased from 152.4 pounds May 10. Patient has only been tolerating 3-4 cans of Nutren 1.5 daily.  Goal rate of 6 cans daily. Noted patient has mucositis which has improved. He denies constipation or diarrhea. Patient receives feeding tube supplies from the [place] and is requesting continuous feeding pump.  Nutrition diagnosis: Unintended weight loss continues.  Intervention: I agree with patient switching to continuous tube feeding via continuous, pump. Patient agrees to contact the [facility] to provide him with the pump and more tube feeding supplies. Brief education provided on continuous feeding. Encouraged patient to continue oral intake by mouth as tolerated. Questions were answered.  Teach back method used.  Monitoring, evaluation, goals: Patient will tolerate goal rate of 6 bottles Nutren 1.5 to minimize any further weight loss.  Next visit: Monday, June 25.  **Disclaimer: This note was dictated with voice recognition software. Similar sounding words can inadvertently be transcribed and this note may contain transcription errors which may not have been corrected upon publication of note.**

## 2017-05-17 NOTE — Progress Notes (Signed)
Pt verbalized he wanted to cancel Saturday's IV fluid appt..  This was done.  Also pt's wife states that they have received a grant towards medications @ [facility]. They wanted the pharmacy notified so they could pick up they Acyclovir suspension. This was also done for them.  [facility] notified the patient that he could pick up the prescription on Monday.  Pt's right  forehead (area of shingles infection) as well as his right eye are looking much better today. Not as red and inflamed and his forehead is crusting over nicely.  Wife has not yet called opthamalogist for eye exam.

## 2017-05-17 NOTE — Progress Notes (Signed)
  Mr. Lipford presents for follow up of radiation completed 04/25/17 to his tongue and bilateral neck.   Pain issues, if any: He reports pain to his Right eye from a recurrent eye infection. He did have shingles to his Right forehead area.  Using a feeding tube?: He is instilling 5 cans of osmolite daily. Some days he does not feel well and will not instill that amount.  Weight changes, if any:  Wt Readings from Last 3 Encounters:  05/17/17 142 lb (64.4 kg)  05/16/17 140 lb 12.8 oz (63.9 kg)  05/15/17 140 lb 9.6 oz (63.8 kg)   Swallowing issues, if any: He is drinking liquids only by mouth. He has tried softer foods, but it gets stuck.  Smoking or chewing tobacco? No Using fluoride trays daily? No Last ENT visit was on: No Other notable issues, if any:  He is getting IVF daily through tomorrow. He will then start IVF Monday- Wednesday- Friday through 05/31/17  BP (!) 138/93   Pulse 74   Ht 5\' 8"  (1.727 m)   Wt 142 lb (64.4 kg)   SpO2 98% Comment: room air  BMI 21.59 kg/m

## 2017-05-17 NOTE — Patient Instructions (Signed)
Dehydration, Adult Dehydration is a condition in which there is not enough fluid or water in the body. This happens when you lose more fluids than you take in. Important organs, such as the kidneys, brain, and heart, cannot function without a proper amount of fluids. Any loss of fluids from the body can lead to dehydration. Dehydration can range from mild to severe. This condition should be treated right away to prevent it from becoming severe. What are the causes? This condition may be caused by:  Vomiting.  Diarrhea.  Excessive sweating, such as from heat exposure or exercise.  Not drinking enough fluid, especially: ? When ill. ? While doing activity that requires a lot of energy.  Excessive urination.  Fever.  Infection.  Certain medicines, such as medicines that cause the body to lose excess fluid (diuretics).  Inability to access safe drinking water.  Reduced physical ability to get adequate water and food.  What increases the risk? This condition is more likely to develop in people:  Who have a poorly controlled long-term (chronic) illness, such as diabetes, heart disease, or kidney disease.  Who are age 65 or older.  Who are disabled.  Who live in a place with high altitude.  Who play endurance sports.  What are the signs or symptoms? Symptoms of mild dehydration may include:  Thirst.  Dry lips.  Slightly dry mouth.  Dry, warm skin.  Dizziness. Symptoms of moderate dehydration may include:  Very dry mouth.  Muscle cramps.  Dark urine. Urine may be the color of tea.  Decreased urine production.  Decreased tear production.  Heartbeat that is irregular or faster than normal (palpitations).  Headache.  Light-headedness, especially when you stand up from a sitting position.  Fainting (syncope). Symptoms of severe dehydration may include:  Changes in skin, such as: ? Cold and clammy skin. ? Blotchy (mottled) or pale skin. ? Skin that does  not quickly return to normal after being lightly pinched and released (poor skin turgor).  Changes in body fluids, such as: ? Extreme thirst. ? No tear production. ? Inability to sweat when body temperature is high, such as in hot weather. ? Very little urine production.  Changes in vital signs, such as: ? Weak pulse. ? Pulse that is more than 100 beats a minute when sitting still. ? Rapid breathing. ? Low blood pressure.  Other changes, such as: ? Sunken eyes. ? Cold hands and feet. ? Confusion. ? Lack of energy (lethargy). ? Difficulty waking up from sleep. ? Short-term weight loss. ? Unconsciousness. How is this diagnosed? This condition is diagnosed based on your symptoms and a physical exam. Blood and urine tests may be done to help confirm the diagnosis. How is this treated? Treatment for this condition depends on the severity. Mild or moderate dehydration can often be treated at home. Treatment should be started right away. Do not wait until dehydration becomes severe. Severe dehydration is an emergency and it needs to be treated in a hospital. Treatment for mild dehydration may include:  Drinking more fluids.  Replacing salts and minerals in your blood (electrolytes) that you may have lost. Treatment for moderate dehydration may include:  Drinking an oral rehydration solution (ORS). This is a drink that helps you replace fluids and electrolytes (rehydrate). It can be found at pharmacies and retail stores. Treatment for severe dehydration may include:  Receiving fluids through an IV tube.  Receiving an electrolyte solution through a feeding tube that is passed through your nose   and into your stomach (nasogastric tube, or NG tube).  Correcting any abnormalities in electrolytes.  Treating the underlying cause of dehydration. Follow these instructions at home:  If directed by your health care provider, drink an ORS: ? Make an ORS by following instructions on the  package. ? Start by drinking small amounts, about  cup (120 mL) every 5-10 minutes. ? Slowly increase how much you drink until you have taken the amount recommended by your health care provider.  Drink enough clear fluid to keep your urine clear or pale yellow. If you were told to drink an ORS, finish the ORS first, then start slowly drinking other clear fluids. Drink fluids such as: ? Water. Do not drink only water. Doing that can lead to having too little salt (sodium) in the body (hyponatremia). ? Ice chips. ? Fruit juice that you have added water to (diluted fruit juice). ? Low-calorie sports drinks.  Avoid: ? Alcohol. ? Drinks that contain a lot of sugar. These include high-calorie sports drinks, fruit juice that is not diluted, and soda. ? Caffeine. ? Foods that are greasy or contain a lot of fat or sugar.  Take over-the-counter and prescription medicines only as told by your health care provider.  Do not take sodium tablets. This can lead to having too much sodium in the body (hypernatremia).  Eat foods that contain a healthy balance of electrolytes, such as bananas, oranges, potatoes, tomatoes, and spinach.  Keep all follow-up visits as told by your health care provider. This is important. Contact a health care provider if:  You have abdominal pain that: ? Gets worse. ? Stays in one area (localizes).  You have a rash.  You have a stiff neck.  You are more irritable than usual.  You are sleepier or more difficult to wake up than usual.  You feel weak or dizzy.  You feel very thirsty.  You have urinated only a small amount of very dark urine over 6-8 hours. Get help right away if:  You have symptoms of severe dehydration.  You cannot drink fluids without vomiting.  Your symptoms get worse with treatment.  You have a fever.  You have a severe headache.  You have vomiting or diarrhea that: ? Gets worse. ? Does not go away.  You have blood or green matter  (bile) in your vomit.  You have blood in your stool. This may cause stool to look black and tarry.  You have not urinated in 6-8 hours.  You faint.  Your heart rate while sitting still is over 100 beats a minute.  You have trouble breathing. This information is not intended to replace advice given to you by your health care provider. Make sure you discuss any questions you have with your health care provider. Document Released: 11/19/2005 Document Revised: 06/15/2016 Document Reviewed: 01/13/2016 Elsevier Interactive Patient Education  2018 [employer]  

## 2017-05-18 ENCOUNTER — Ambulatory Visit: Payer: Medicare PPO

## 2017-05-20 ENCOUNTER — Ambulatory Visit (HOSPITAL_BASED_OUTPATIENT_CLINIC_OR_DEPARTMENT_OTHER): Payer: Medicare PPO

## 2017-05-20 ENCOUNTER — Ambulatory Visit: Payer: No Typology Code available for payment source | Attending: Radiation Oncology

## 2017-05-20 VITALS — BP 109/76 | HR 73 | Temp 97.5°F | Resp 18 | Ht 68.0 in | Wt 139.9 lb

## 2017-05-20 DIAGNOSIS — E860 Dehydration: Secondary | ICD-10-CM | POA: Diagnosis not present

## 2017-05-20 DIAGNOSIS — C021 Malignant neoplasm of border of tongue: Secondary | ICD-10-CM

## 2017-05-20 DIAGNOSIS — Z95828 Presence of other vascular implants and grafts: Secondary | ICD-10-CM

## 2017-05-20 MED ORDER — SODIUM CHLORIDE 0.9 % IV SOLN
Freq: Once | INTRAVENOUS | Status: AC
  Administered 2017-05-20: 13:00:00 via INTRAVENOUS

## 2017-05-20 MED ORDER — HEPARIN SOD (PORK) LOCK FLUSH 100 UNIT/ML IV SOLN
500.0000 [IU] | Freq: Once | INTRAVENOUS | Status: AC | PRN
  Administered 2017-05-20: 500 [IU]
  Filled 2017-05-20: qty 5

## 2017-05-20 MED ORDER — SODIUM CHLORIDE 0.9% FLUSH
10.0000 mL | INTRAVENOUS | Status: DC | PRN
  Administered 2017-05-20: 10 mL
  Filled 2017-05-20: qty 10

## 2017-05-20 NOTE — Patient Instructions (Signed)
Dehydration, Adult Dehydration is a condition in which there is not enough fluid or water in the body. This happens when you lose more fluids than you take in. Important organs, such as the kidneys, brain, and heart, cannot function without a proper amount of fluids. Any loss of fluids from the body can lead to dehydration. Dehydration can range from mild to severe. This condition should be treated right away to prevent it from becoming severe. What are the causes? This condition may be caused by:  Vomiting.  Diarrhea.  Excessive sweating, such as from heat exposure or exercise.  Not drinking enough fluid, especially: ? When ill. ? While doing activity that requires a lot of energy.  Excessive urination.  Fever.  Infection.  Certain medicines, such as medicines that cause the body to lose excess fluid (diuretics).  Inability to access safe drinking water.  Reduced physical ability to get adequate water and food.  What increases the risk? This condition is more likely to develop in people:  Who have a poorly controlled long-term (chronic) illness, such as diabetes, heart disease, or kidney disease.  Who are age 65 or older.  Who are disabled.  Who live in a place with high altitude.  Who play endurance sports.  What are the signs or symptoms? Symptoms of mild dehydration may include:  Thirst.  Dry lips.  Slightly dry mouth.  Dry, warm skin.  Dizziness. Symptoms of moderate dehydration may include:  Very dry mouth.  Muscle cramps.  Dark urine. Urine may be the color of tea.  Decreased urine production.  Decreased tear production.  Heartbeat that is irregular or faster than normal (palpitations).  Headache.  Light-headedness, especially when you stand up from a sitting position.  Fainting (syncope). Symptoms of severe dehydration may include:  Changes in skin, such as: ? Cold and clammy skin. ? Blotchy (mottled) or pale skin. ? Skin that does  not quickly return to normal after being lightly pinched and released (poor skin turgor).  Changes in body fluids, such as: ? Extreme thirst. ? No tear production. ? Inability to sweat when body temperature is high, such as in hot weather. ? Very little urine production.  Changes in vital signs, such as: ? Weak pulse. ? Pulse that is more than 100 beats a minute when sitting still. ? Rapid breathing. ? Low blood pressure.  Other changes, such as: ? Sunken eyes. ? Cold hands and feet. ? Confusion. ? Lack of energy (lethargy). ? Difficulty waking up from sleep. ? Short-term weight loss. ? Unconsciousness. How is this diagnosed? This condition is diagnosed based on your symptoms and a physical exam. Blood and urine tests may be done to help confirm the diagnosis. How is this treated? Treatment for this condition depends on the severity. Mild or moderate dehydration can often be treated at home. Treatment should be started right away. Do not wait until dehydration becomes severe. Severe dehydration is an emergency and it needs to be treated in a hospital. Treatment for mild dehydration may include:  Drinking more fluids.  Replacing salts and minerals in your blood (electrolytes) that you may have lost. Treatment for moderate dehydration may include:  Drinking an oral rehydration solution (ORS). This is a drink that helps you replace fluids and electrolytes (rehydrate). It can be found at pharmacies and retail stores. Treatment for severe dehydration may include:  Receiving fluids through an IV tube.  Receiving an electrolyte solution through a feeding tube that is passed through your nose   and into your stomach (nasogastric tube, or NG tube).  Correcting any abnormalities in electrolytes.  Treating the underlying cause of dehydration. Follow these instructions at home:  If directed by your health care provider, drink an ORS: ? Make an ORS by following instructions on the  package. ? Start by drinking small amounts, about  cup (120 mL) every 5-10 minutes. ? Slowly increase how much you drink until you have taken the amount recommended by your health care provider.  Drink enough clear fluid to keep your urine clear or pale yellow. If you were told to drink an ORS, finish the ORS first, then start slowly drinking other clear fluids. Drink fluids such as: ? Water. Do not drink only water. Doing that can lead to having too little salt (sodium) in the body (hyponatremia). ? Ice chips. ? Fruit juice that you have added water to (diluted fruit juice). ? Low-calorie sports drinks.  Avoid: ? Alcohol. ? Drinks that contain a lot of sugar. These include high-calorie sports drinks, fruit juice that is not diluted, and soda. ? Caffeine. ? Foods that are greasy or contain a lot of fat or sugar.  Take over-the-counter and prescription medicines only as told by your health care provider.  Do not take sodium tablets. This can lead to having too much sodium in the body (hypernatremia).  Eat foods that contain a healthy balance of electrolytes, such as bananas, oranges, potatoes, tomatoes, and spinach.  Keep all follow-up visits as told by your health care provider. This is important. Contact a health care provider if:  You have abdominal pain that: ? Gets worse. ? Stays in one area (localizes).  You have a rash.  You have a stiff neck.  You are more irritable than usual.  You are sleepier or more difficult to wake up than usual.  You feel weak or dizzy.  You feel very thirsty.  You have urinated only a small amount of very dark urine over 6-8 hours. Get help right away if:  You have symptoms of severe dehydration.  You cannot drink fluids without vomiting.  Your symptoms get worse with treatment.  You have a fever.  You have a severe headache.  You have vomiting or diarrhea that: ? Gets worse. ? Does not go away.  You have blood or green matter  (bile) in your vomit.  You have blood in your stool. This may cause stool to look black and tarry.  You have not urinated in 6-8 hours.  You faint.  Your heart rate while sitting still is over 100 beats a minute.  You have trouble breathing. This information is not intended to replace advice given to you by your health care provider. Make sure you discuss any questions you have with your health care provider. Document Released: 11/19/2005 Document Revised: 06/15/2016 Document Reviewed: 01/13/2016 Elsevier Interactive Patient Education  2018 [employer]  

## 2017-05-21 ENCOUNTER — Telehealth: Payer: Self-pay | Admitting: *Deleted

## 2017-05-21 NOTE — Telephone Encounter (Signed)
CALLED PATIENT TO INFORM OF STAT LAB ON 08-01-17 @ 9:45 AM AND HIS CT ON 08-01-17- ARRIVAL TIME - 10:45 AM, PT. TO HAVE CLEAR LIQUIDS ONLY - 4 HRS. PRIOR , TEST TO BE @ [facility], AND HIS FU ON 08-05-17 WITH DR. Isidore Moos FOR RESULTS, LVM FOR A RETURN CALL

## 2017-05-22 ENCOUNTER — Telehealth: Payer: Self-pay | Admitting: Hematology and Oncology

## 2017-05-22 ENCOUNTER — Ambulatory Visit (HOSPITAL_BASED_OUTPATIENT_CLINIC_OR_DEPARTMENT_OTHER): Payer: Medicare PPO | Admitting: Hematology and Oncology

## 2017-05-22 ENCOUNTER — Ambulatory Visit (HOSPITAL_BASED_OUTPATIENT_CLINIC_OR_DEPARTMENT_OTHER): Payer: Medicare PPO

## 2017-05-22 VITALS — BP 141/85 | HR 73 | Temp 97.6°F | Resp 18 | Ht 68.0 in | Wt 138.7 lb

## 2017-05-22 DIAGNOSIS — Z452 Encounter for adjustment and management of vascular access device: Secondary | ICD-10-CM

## 2017-05-22 DIAGNOSIS — C021 Malignant neoplasm of border of tongue: Secondary | ICD-10-CM | POA: Diagnosis not present

## 2017-05-22 DIAGNOSIS — K1231 Oral mucositis (ulcerative) due to antineoplastic therapy: Secondary | ICD-10-CM

## 2017-05-22 DIAGNOSIS — B029 Zoster without complications: Secondary | ICD-10-CM

## 2017-05-22 DIAGNOSIS — Z95828 Presence of other vascular implants and grafts: Secondary | ICD-10-CM

## 2017-05-22 MED ORDER — SODIUM CHLORIDE 0.9 % IV SOLN
Freq: Once | INTRAVENOUS | Status: AC
  Administered 2017-05-22: 13:00:00 via INTRAVENOUS

## 2017-05-22 MED ORDER — SODIUM CHLORIDE 0.9% FLUSH
10.0000 mL | INTRAVENOUS | Status: DC | PRN
  Administered 2017-05-22: 10 mL
  Filled 2017-05-22: qty 10

## 2017-05-22 MED ORDER — HEPARIN SOD (PORK) LOCK FLUSH 100 UNIT/ML IV SOLN
500.0000 [IU] | Freq: Once | INTRAVENOUS | Status: AC | PRN
  Administered 2017-05-22: 500 [IU]
  Filled 2017-05-22: qty 5

## 2017-05-22 MED ORDER — ALTEPLASE 2 MG IJ SOLR
2.0000 mg | Freq: Once | INTRAMUSCULAR | Status: DC | PRN
  Filled 2017-05-22: qty 2

## 2017-05-22 MED ORDER — HEPARIN SOD (PORK) LOCK FLUSH 100 UNIT/ML IV SOLN
250.0000 [IU] | Freq: Once | INTRAVENOUS | Status: DC | PRN
  Filled 2017-05-22: qty 5

## 2017-05-22 NOTE — Patient Instructions (Signed)
Dehydration, Adult Dehydration is a condition in which there is not enough fluid or water in the body. This happens when you lose more fluids than you take in. Important organs, such as the kidneys, brain, and heart, cannot function without a proper amount of fluids. Any loss of fluids from the body can lead to dehydration. Dehydration can range from mild to severe. This condition should be treated right away to prevent it from becoming severe. What are the causes? This condition may be caused by:  Vomiting.  Diarrhea.  Excessive sweating, such as from heat exposure or exercise.  Not drinking enough fluid, especially: ? When ill. ? While doing activity that requires a lot of energy.  Excessive urination.  Fever.  Infection.  Certain medicines, such as medicines that cause the body to lose excess fluid (diuretics).  Inability to access safe drinking water.  Reduced physical ability to get adequate water and food.  What increases the risk? This condition is more likely to develop in people:  Who have a poorly controlled long-term (chronic) illness, such as diabetes, heart disease, or kidney disease.  Who are age 65 or older.  Who are disabled.  Who live in a place with high altitude.  Who play endurance sports.  What are the signs or symptoms? Symptoms of mild dehydration may include:  Thirst.  Dry lips.  Slightly dry mouth.  Dry, warm skin.  Dizziness. Symptoms of moderate dehydration may include:  Very dry mouth.  Muscle cramps.  Dark urine. Urine may be the color of tea.  Decreased urine production.  Decreased tear production.  Heartbeat that is irregular or faster than normal (palpitations).  Headache.  Light-headedness, especially when you stand up from a sitting position.  Fainting (syncope). Symptoms of severe dehydration may include:  Changes in skin, such as: ? Cold and clammy skin. ? Blotchy (mottled) or pale skin. ? Skin that does  not quickly return to normal after being lightly pinched and released (poor skin turgor).  Changes in body fluids, such as: ? Extreme thirst. ? No tear production. ? Inability to sweat when body temperature is high, such as in hot weather. ? Very little urine production.  Changes in vital signs, such as: ? Weak pulse. ? Pulse that is more than 100 beats a minute when sitting still. ? Rapid breathing. ? Low blood pressure.  Other changes, such as: ? Sunken eyes. ? Cold hands and feet. ? Confusion. ? Lack of energy (lethargy). ? Difficulty waking up from sleep. ? Short-term weight loss. ? Unconsciousness. How is this diagnosed? This condition is diagnosed based on your symptoms and a physical exam. Blood and urine tests may be done to help confirm the diagnosis. How is this treated? Treatment for this condition depends on the severity. Mild or moderate dehydration can often be treated at home. Treatment should be started right away. Do not wait until dehydration becomes severe. Severe dehydration is an emergency and it needs to be treated in a hospital. Treatment for mild dehydration may include:  Drinking more fluids.  Replacing salts and minerals in your blood (electrolytes) that you may have lost. Treatment for moderate dehydration may include:  Drinking an oral rehydration solution (ORS). This is a drink that helps you replace fluids and electrolytes (rehydrate). It can be found at pharmacies and retail stores. Treatment for severe dehydration may include:  Receiving fluids through an IV tube.  Receiving an electrolyte solution through a feeding tube that is passed through your nose   and into your stomach (nasogastric tube, or NG tube).  Correcting any abnormalities in electrolytes.  Treating the underlying cause of dehydration. Follow these instructions at home:  If directed by your health care provider, drink an ORS: ? Make an ORS by following instructions on the  package. ? Start by drinking small amounts, about  cup (120 mL) every 5-10 minutes. ? Slowly increase how much you drink until you have taken the amount recommended by your health care provider.  Drink enough clear fluid to keep your urine clear or pale yellow. If you were told to drink an ORS, finish the ORS first, then start slowly drinking other clear fluids. Drink fluids such as: ? Water. Do not drink only water. Doing that can lead to having too little salt (sodium) in the body (hyponatremia). ? Ice chips. ? Fruit juice that you have added water to (diluted fruit juice). ? Low-calorie sports drinks.  Avoid: ? Alcohol. ? Drinks that contain a lot of sugar. These include high-calorie sports drinks, fruit juice that is not diluted, and soda. ? Caffeine. ? Foods that are greasy or contain a lot of fat or sugar.  Take over-the-counter and prescription medicines only as told by your health care provider.  Do not take sodium tablets. This can lead to having too much sodium in the body (hypernatremia).  Eat foods that contain a healthy balance of electrolytes, such as bananas, oranges, potatoes, tomatoes, and spinach.  Keep all follow-up visits as told by your health care provider. This is important. Contact a health care provider if:  You have abdominal pain that: ? Gets worse. ? Stays in one area (localizes).  You have a rash.  You have a stiff neck.  You are more irritable than usual.  You are sleepier or more difficult to wake up than usual.  You feel weak or dizzy.  You feel very thirsty.  You have urinated only a small amount of very dark urine over 6-8 hours. Get help right away if:  You have symptoms of severe dehydration.  You cannot drink fluids without vomiting.  Your symptoms get worse with treatment.  You have a fever.  You have a severe headache.  You have vomiting or diarrhea that: ? Gets worse. ? Does not go away.  You have blood or green matter  (bile) in your vomit.  You have blood in your stool. This may cause stool to look black and tarry.  You have not urinated in 6-8 hours.  You faint.  Your heart rate while sitting still is over 100 beats a minute.  You have trouble breathing. This information is not intended to replace advice given to you by your health care provider. Make sure you discuss any questions you have with your health care provider. Document Released: 11/19/2005 Document Revised: 06/15/2016 Document Reviewed: 01/13/2016 Elsevier Interactive Patient Education  2018 [employer]  

## 2017-05-22 NOTE — Telephone Encounter (Signed)
Scheduled appt per 6/20 los - Gave patient AVS and calender per 6/20 los.  

## 2017-05-23 ENCOUNTER — Encounter: Payer: Self-pay | Admitting: Hematology and Oncology

## 2017-05-23 NOTE — Assessment & Plan Note (Signed)
He has outbreak of shingles. He is improving after taking Valtrex

## 2017-05-23 NOTE — Progress Notes (Signed)
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 This caused him significant discomfort, resulting in decreased food intake.      12/28/2016 Imaging    CT of the neck on 12/28/16 had demonstrate no frank adenopathy in the neck and the tongue was not well visualized die to dental work. On the same day a CT of his chest showed emphysema of the lungs and a few nonspecific pulmonary nodules which were unchanged compared to 06/2015.      01/15/2017 PET scan    This revealed hypermetabolic activity in the left anterior tongue as well as within a small left level 2 node and small nodules in the left parotid gland. No evidence of metastatic disease distantly      01/28/2017 Pathology Results    A.TONGUE, LEFT PARTIAL GLOSSECTOMY: Invasive moderate to poorly-differentiated squamous cell carcinoma, conventional type, 2.5 cm in greatest dimension on gross examination. Invasive squamous cell carcinoma shows greatest depth of invasion of 1.5 cm. Margins uninvolved by invasive tumor (invasive squamous cell carcinoma comes to within 0.6 cm of the inked posterior margin). Negative for lymphovascular invasion Negative for perineural invasion. AJCC Pathologic Stage: pT3 pN3 pM- not applicable. See Cancer Case Summary.  SURGICAL PATHOLOGY CANCER CASE SUMMARY- LIP AND ORAL CAVITY Protocol posting date: June 2017 Procedure: Partial glossectomy Tumor site: Oral, tongue Tumor laterality: left Tumor focality: unifocal Tumor size: Greatest dimension 2.5 cm on gross examination Histologic type: Squamous cell carcinoma, conventional Histologic grade: G2-G3, moderate to poorly differentiated Specimen margins:  Uninvolved by invasive tumor Distance from closest margin: 6 mm (0.6 cm) from posterior margin Lymphovascular invasion: Not identified Perineural invasion: Not identified Regional lymph nodes: Number of lymph nodes involved: 3 Number of lymph nodes examined: 76 Laterality of lymph nodes involved: Bilateral Size of largest metastatic deposit: 9 mm (0.9 cm) Extranodal extension: very focally present in 1 lymph node (part J) Pathologic Stage Classification (pTNM, AJCC 8th Edition) Primary tumor (pT): pT3 Regional lymph nodes (pN): pN3 Distant metastasis (pM): not applicable   B.ANTERIOR DORSAL TONGUE, BIOPSY: Negative for malignancy.  C.POSTERIOR DORSAL TONGUE, BIOPSY: Negative for malignancy.  D.MID DORSAL TONGUE, BIOPSY: Negative for malignancy.  E.DEEP TONGUE, BIOPSY: Negative for malignancy.  F.ANTERIOR FLOOR OF MOUTH, BIOPSY: Negative for malignancy.  G.POSTERIOR FLOOR OF MOUTH, BIOPSY: Negative for malignancy.  H.LEVEL IA, RESECTION: Six benign lymph nodes negative for metastatic carcinoma on routine H&E staining (0/6).  I.LEVEL IB, RESECTION: Two benign lymph nodes negative for metastatic carcinoma on routine H&E staining (0/2). Benign salivary gland tissue.  J.LEFT LEVEL IIA , 3 AND 4, RESECTION: One of twenty-one lymph nodes positive for metastatic carcinoma (0.9 cm focus) with very focal extranodal extension on routine H&E staining (1/21).  K.LEVEL IIB, RESECTION: Four benign lymph nodes negative for metastatic carcinoma on routine H&E staining (0/4). One small focus of benign salivary gland tissue.  L.RIGHT LEVEL IB, RESECTION: Three benign lymph nodes negative for  metastatic carcinoma on routine H&E staining (0/3). Benign salivary gland tissue.  M.RIGHT LEVEL 2A, 3, 4, RESECTION: Two of fifteen lymph nodes positive for metastatic carcinoma (2/15). See comment.  Comment: A small focus suspicious for metastatic carcinoma is seen on the initial H&E stained slide. Therefore, a pankeratin immunohistochemical stain  is performed. The positive control stain functioned appropriately. The pankeratin immunostain highlights a few minute foci of metastatic carcinoma in 2 of the 3 lymph nodes (largest focus 0.2 mm [0.02 cm]).      01/28/2017 Surgery    He had surgery at [facility] <100 SQCM [15100] (SPLIT THICKNESS SKIN GRAFT LEG)           03/01/2017 Procedure    Successful fluoroscopic guided percutaneous gastrostomy tube placement.      03/01/2017 Procedure    Placement of a subcutaneous port device. Catheter tip at the superior cavoatrial junction and ready to be used.      03/12/2017 - 04/25/2017 Radiation Therapy    Received IMRT / 6 MV photons to tongue and bilateral neck / 60 Gy in 30 fractions.      03/13/2017 Procedure    Baseline hearing is near normal      03/15/2017 - 04/12/2017 Chemotherapy    He received cisplatin x 5 weekly doses      04/17/2017 - 04/25/2017 Hospital Admission    He was admitted to the hospital for management of severe mucositis pain, dehydration, dizziness and uncontrolled nausea       INTERVAL HISTORY: Please see below for problem oriented charting. He returns for further follow-up He is doing well Denies pain He had recent mild diarrhea His appetite is good His rash is resolved He has started to eat and drink but not enough He is still dependent on nutritional supplement  REVIEW OF SYSTEMS:   Constitutional: Denies fevers, chills or abnormal weight loss Eyes: Denies blurriness of  vision Ears, nose, mouth, throat, and face: Denies mucositis or sore throat Respiratory: Denies cough, dyspnea or wheezes Cardiovascular: Denies palpitation, chest discomfort or lower extremity swelling Skin: Denies abnormal skin rashes Lymphatics: Denies new lymphadenopathy or easy bruising Neurological:Denies numbness, tingling or new weaknesses Behavioral/Psych: Mood is stable, no new changes  All other systems were reviewed with the patient and are negative.  I have reviewed the past medical history, past surgical history, social history and family history with the patient and they are unchanged from previous note.  ALLERGIES:  is allergic to latex; adhesive [tape]; and codeine.  MEDICATIONS:  Current Outpatient Prescriptions  Medication Sig Dispense Refill  . aspirin 81 MG chewable tablet Chew 81 mg by mouth every morning.     Marland Kitchen doxycycline (VIBRA-TABS) 100 MG tablet Take 1 tablet (100 mg total) by mouth 2 (two) times daily. 14 tablet 0  . fentaNYL (DURAGESIC - DOSED MCG/HR) 12 MCG/HR Place 3 patches (37.5 mcg total) onto the skin every 3 (three) days. 10 patch 0  . lidocaine-prilocaine (EMLA) cream Apply to affected area once 30 g 3  . magic mouthwash w/lidocaine SOLN Take 5 mLs by mouth 4 (four) times daily as needed for mouth pain. (Patient not taking: Reported on 03/28/2017) 240 mL 0  . magnesium oxide (MAG-OX) 400 (241.3 Mg) MG tablet Take 1 tablet (400 mg total) by mouth 2 (two) times daily. 60 tablet 9  . morphine (ROXANOL) 20 MG/ML concentrated solution Take 1 mL (20 mg total) by mouth every 2 (two) hours as needed for severe pain. 240 mL 0  . ondansetron (ZOFRAN) 8 MG tablet Take 1 tablet (8 mg total) by mouth every 8 (eight) hours as needed. Start on the third day after chemotherapy. (Patient not taking: Reported on 03/21/2017) 30 tablet 1  . prochlorperazine (COMPAZINE)  10 MG tablet Take 1 tablet (10 mg total) by mouth every 6 (six) hours as needed (Nausea or vomiting).  (Patient not taking: Reported on 03/21/2017) 30 tablet 1  . scopolamine (TRANSDERM-SCOP) 1 MG/3DAYS Place 1 patch (1.5 mg total) onto the skin every 3 (three) days. 10 patch 12   No current facility-administered medications for this visit.     PHYSICAL EXAMINATION: ECOG PERFORMANCE STATUS: 1 - Symptomatic but completely ambulatory GENERAL:alert, no distress and comfortable SKIN: Skin rash is resolving EYES: normal, Conjunctiva are pink and non-injected, sclera clear OROPHARYNX:no exudate, no erythema and lips, buccal mucosa, and tongue normal  NECK: supple, thyroid normal size, non-tender, without nodularity LYMPH:  no palpable lymphadenopathy in the cervical, axillary or inguinal LUNGS: clear to auscultation and percussion with normal breathing effort HEART: regular rate & rhythm and no murmurs and no lower extremity edema ABDOMEN:abdomen soft, non-tender and normal bowel sounds Musculoskeletal:no cyanosis of digits and no clubbing  NEURO: alert & oriented x 3 with fluent speech, no focal motor/sensory deficits  LABORATORY DATA:  I have reviewed the data as listed    Component Value Date/Time   NA 130 (L) 05/13/2017 0912   K 3.4 (L) 05/13/2017 0912   CL 97 (L) 04/23/2017 0430   CO2 32 (H) 05/13/2017 0912   GLUCOSE 93 05/13/2017 0912   BUN 23.7 05/13/2017 0912   CREATININE 1.0 05/13/2017 0912   CALCIUM 9.4 05/13/2017 0912   PROT 7.7 05/13/2017 0912   ALBUMIN 3.3 (L) 05/13/2017 0912   AST 31 05/13/2017 0912   ALT 27 05/13/2017 0912   ALKPHOS 61 05/13/2017 0912   BILITOT 0.53 05/13/2017 0912   GFRNONAA >60 04/23/2017 0430   GFRAA >60 04/23/2017 0430    No results found for: SPEP, UPEP  Lab Results  Component Value Date   WBC 6.3 05/13/2017   NEUTROABS 4.7 05/13/2017   HGB 13.2 05/13/2017   HCT 39.1 05/13/2017   MCV 94.4 05/13/2017   PLT 248 05/13/2017      Chemistry      Component Value Date/Time   NA 130 (L) 05/13/2017 0912   K 3.4 (L) 05/13/2017 0912   CL 97  (L) 04/23/2017 0430   CO2 32 (H) 05/13/2017 0912   BUN 23.7 05/13/2017 0912   CREATININE 1.0 05/13/2017 0912      Component Value Date/Time   CALCIUM 9.4 05/13/2017 0912   ALKPHOS 61 05/13/2017 0912   AST 31 05/13/2017 0912   ALT 27 05/13/2017 0912   BILITOT 0.53 05/13/2017 0912      ASSESSMENT & PLAN:  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HCC) He has recently completed treatment and side-effects of therapy are subsiding I am reducing the frequency of IV fluids next week and consider stopping altogether after evaluation next week  Shingles rash He has outbreak of shingles. He is improving after taking Valtrex  Mucositis due to antineoplastic therapy He denies pain.  I recommend fentanyl taper   No orders of the defined types were placed in this encounter.  All questions were answered. The patient knows to call the clinic with any problems, questions or concerns. No barriers to learning was detected. I spent 15 minutes counseling the patient face to face. The total time spent in the appointment was 20 minutes and more than 50% was on counseling and review of test results     Heath Lark, MD 05/23/2017 3:05 PM

## 2017-05-23 NOTE — Assessment & Plan Note (Signed)
He denies pain.  I recommend fentanyl taper

## 2017-05-23 NOTE — Assessment & Plan Note (Signed)
He has recently completed treatment and side-effects of therapy are subsiding I am reducing the frequency of IV fluids next week and consider stopping altogether after evaluation next week

## 2017-05-24 ENCOUNTER — Ambulatory Visit (HOSPITAL_BASED_OUTPATIENT_CLINIC_OR_DEPARTMENT_OTHER): Payer: Medicare PPO

## 2017-05-24 ENCOUNTER — Ambulatory Visit: Payer: Non-veteran care

## 2017-05-24 ENCOUNTER — Other Ambulatory Visit (HOSPITAL_BASED_OUTPATIENT_CLINIC_OR_DEPARTMENT_OTHER): Payer: Non-veteran care

## 2017-05-24 VITALS — BP 130/76 | HR 83 | Resp 18 | Ht 68.0 in | Wt 139.8 lb

## 2017-05-24 DIAGNOSIS — E860 Dehydration: Secondary | ICD-10-CM

## 2017-05-24 DIAGNOSIS — C021 Malignant neoplasm of border of tongue: Secondary | ICD-10-CM

## 2017-05-24 DIAGNOSIS — Z95828 Presence of other vascular implants and grafts: Secondary | ICD-10-CM

## 2017-05-24 LAB — CBC WITH DIFFERENTIAL/PLATELET
BASO%: 0.3 % (ref 0.0–2.0)
BASOS ABS: 0 10*3/uL (ref 0.0–0.1)
EOS%: 3.2 % (ref 0.0–7.0)
Eosinophils Absolute: 0.2 10*3/uL (ref 0.0–0.5)
HEMATOCRIT: 35.7 % — AB (ref 38.4–49.9)
HEMOGLOBIN: 11.8 g/dL — AB (ref 13.0–17.1)
LYMPH#: 0.6 10*3/uL — AB (ref 0.9–3.3)
LYMPH%: 7.8 % — ABNORMAL LOW (ref 14.0–49.0)
MCH: 31.8 pg (ref 27.2–33.4)
MCHC: 33.1 g/dL (ref 32.0–36.0)
MCV: 95.9 fL (ref 79.3–98.0)
MONO#: 0.9 10*3/uL (ref 0.1–0.9)
MONO%: 11.7 % (ref 0.0–14.0)
NEUT#: 6 10*3/uL (ref 1.5–6.5)
NEUT%: 77 % — AB (ref 39.0–75.0)
Platelets: 213 10*3/uL (ref 140–400)
RBC: 3.72 10*6/uL — ABNORMAL LOW (ref 4.20–5.82)
RDW: 16.7 % — AB (ref 11.0–14.6)
WBC: 7.8 10*3/uL (ref 4.0–10.3)

## 2017-05-24 LAB — COMPREHENSIVE METABOLIC PANEL
ALBUMIN: 3.3 g/dL — AB (ref 3.5–5.0)
ALK PHOS: 56 U/L (ref 40–150)
ALT: 30 U/L (ref 0–55)
AST: 27 U/L (ref 5–34)
Anion Gap: 10 mEq/L (ref 3–11)
BUN: 18.5 mg/dL (ref 7.0–26.0)
CO2: 32 mEq/L — ABNORMAL HIGH (ref 22–29)
Calcium: 9.4 mg/dL (ref 8.4–10.4)
Chloride: 94 mEq/L — ABNORMAL LOW (ref 98–109)
Creatinine: 0.8 mg/dL (ref 0.7–1.3)
EGFR: >90 mL/min/{1.73_m2} (ref >90)
GLUCOSE: 118 mg/dL (ref 70–140)
POTASSIUM: 3.6 meq/L (ref 3.5–5.1)
SODIUM: 136 meq/L (ref 136–145)
Total Bilirubin: 0.67 mg/dL (ref 0.20–1.20)
Total Protein: 6.8 g/dL (ref 6.4–8.3)

## 2017-05-24 LAB — MAGNESIUM: MAGNESIUM: 1.9 mg/dL (ref 1.5–2.5)

## 2017-05-24 MED ORDER — HEPARIN SOD (PORK) LOCK FLUSH 100 UNIT/ML IV SOLN
500.0000 [IU] | Freq: Once | INTRAVENOUS | Status: AC | PRN
  Administered 2017-05-24: 500 [IU]
  Filled 2017-05-24: qty 5

## 2017-05-24 MED ORDER — SODIUM CHLORIDE 0.9 % IV SOLN
Freq: Once | INTRAVENOUS | Status: AC
  Administered 2017-05-24: 10:00:00 via INTRAVENOUS

## 2017-05-24 MED ORDER — SODIUM CHLORIDE 0.9% FLUSH
10.0000 mL | INTRAVENOUS | Status: DC | PRN
  Administered 2017-05-24: 10 mL
  Filled 2017-05-24: qty 10

## 2017-05-24 MED ORDER — SODIUM CHLORIDE 0.9% FLUSH
10.0000 mL | Freq: Once | INTRAVENOUS | Status: AC
  Administered 2017-05-24: 10 mL via INTRAVENOUS
  Filled 2017-05-24: qty 10

## 2017-05-24 NOTE — Patient Instructions (Addendum)
Create a paste using cornstarch or baking soda and water to naturally relieve itching caused by a shingles rash. Pour two parts cornstarch or baking soda into a cup, and add one part water to get the desired consistency for the paste. Apply the mixture to your rash, and then rinse it off after 10 to 15 minutes. Repeat several times a day as needed.      In addition to taking a bath to relieve pain and itchiness associated with a shingles rash, apply a cool, moist compress. Do this several times throughout the day to relieve symptoms. Soak a cloth in cool water, wring out the water, and apply the cloth to the rash and blisters.  The coolness of the compress can reduce pain. Repeat the process as often as you need. Do not apply an ice pack to the rash. The coldness may increase skin sensitivity and worsen pain.     Soothing lotions and creams:  Scratching a shingles rash can cause scarring and prolong blisters. If itching doesn't improve after a healing bath, a cool compress, or a baking soda or cornstarch mixture, use soothing lotions and creams. Lotions and creams don't speed up the healing process, but they can increase your comfort level. Avoid scented or perfumed lotions, which can cause further irritation:  Lubriderm; Aveeno.          Dehydration, Adult Dehydration is a condition in which there is not enough fluid or water in the body. This happens when you lose more fluids than you take in. Important organs, such as the kidneys, brain, and heart, cannot function without a proper amount of fluids. Any loss of fluids from the body can lead to dehydration. Dehydration can range from mild to severe. This condition should be treated right away to prevent it from becoming severe. What are the causes? This condition may be caused by:  Vomiting.  Diarrhea.  Excessive sweating, such as from heat exposure or exercise.  Not drinking enough fluid, especially: ? When ill. ? While  doing activity that requires a lot of energy.  Excessive urination.  Fever.  Infection.  Certain medicines, such as medicines that cause the body to lose excess fluid (diuretics).  Inability to access safe drinking water.  Reduced physical ability to get adequate water and food.  What increases the risk? This condition is more likely to develop in people:  Who have a poorly controlled long-term (chronic) illness, such as diabetes, heart disease, or kidney disease.  Who are age 47 or older.  Who are disabled.  Who live in a place with high altitude.  Who play endurance sports.  What are the signs or symptoms? Symptoms of mild dehydration may include:  Thirst.  Dry lips.  Slightly dry mouth.  Dry, warm skin.  Dizziness. Symptoms of moderate dehydration may include:  Very dry mouth.  Muscle cramps.  Dark urine. Urine may be the color of tea.  Decreased urine production.  Decreased tear production.  Heartbeat that is irregular or faster than normal (palpitations).  Headache.  Light-headedness, especially when you stand up from a sitting position.  Fainting (syncope). Symptoms of severe dehydration may include:  Changes in skin, such as: ? Cold and clammy skin. ? Blotchy (mottled) or pale skin. ? Skin that does not quickly return to normal after being lightly pinched and released (poor skin turgor).  Changes in body fluids, such as: ? Extreme thirst. ? No tear production. ? Inability to sweat when  body temperature is high, such as in hot weather. ? Very little urine production.  Changes in vital signs, such as: ? Weak pulse. ? Pulse that is more than 100 beats a minute when sitting still. ? Rapid breathing. ? Low blood pressure.  Other changes, such as: ? Sunken eyes. ? Cold hands and feet. ? Confusion. ? Lack of energy (lethargy). ? Difficulty waking up from sleep. ? Short-term weight loss. ? Unconsciousness. How is this  diagnosed? This condition is diagnosed based on your symptoms and a physical exam. Blood and urine tests may be done to help confirm the diagnosis. How is this treated? Treatment for this condition depends on the severity. Mild or moderate dehydration can often be treated at home. Treatment should be started right away. Do not wait until dehydration becomes severe. Severe dehydration is an emergency and it needs to be treated in a hospital. Treatment for mild dehydration may include:  Drinking more fluids.  Replacing salts and minerals in your blood (electrolytes) that you may have lost. Treatment for moderate dehydration may include:  Drinking an oral rehydration solution (ORS). This is a drink that helps you replace fluids and electrolytes (rehydrate). It can be found at pharmacies and retail stores. Treatment for severe dehydration may include:  Receiving fluids through an IV tube.  Receiving an electrolyte solution through a feeding tube that is passed through your nose and into your stomach (nasogastric tube, or NG tube).  Correcting any abnormalities in electrolytes.  Treating the underlying cause of dehydration. Follow these instructions at home:  If directed by your health care provider, drink an ORS: ? Make an ORS by following instructions on the package. ? Start by drinking small amounts, about  cup (120 mL) every 5-10 minutes. ? Slowly increase how much you drink until you have taken the amount recommended by your health care provider.  Drink enough clear fluid to keep your urine clear or pale yellow. If you were told to drink an ORS, finish the ORS first, then start slowly drinking other clear fluids. Drink fluids such as: ? Water. Do not drink only water. Doing that can lead to having too little salt (sodium) in the body (hyponatremia). ? Ice chips. ? Fruit juice that you have added water to (diluted fruit juice). ? Low-calorie sports  drinks.  Avoid: ? Alcohol. ? Drinks that contain a lot of sugar. These include high-calorie sports drinks, fruit juice that is not diluted, and soda. ? Caffeine. ? Foods that are greasy or contain a lot of fat or sugar.  Take over-the-counter and prescription medicines only as told by your health care provider.  Do not take sodium tablets. This can lead to having too much sodium in the body (hypernatremia).  Eat foods that contain a healthy balance of electrolytes, such as bananas, oranges, potatoes, tomatoes, and spinach.  Keep all follow-up visits as told by your health care provider. This is important. Contact a health care provider if:  You have abdominal pain that: ? Gets worse. ? Stays in one area (localizes).  You have a rash.  You have a stiff neck.  You are more irritable than usual.  You are sleepier or more difficult to wake up than usual.  You feel weak or dizzy.  You feel very thirsty.  You have urinated only a small amount of very dark urine over 6-8 hours. Get help right away if:  You have symptoms of severe dehydration.  You cannot drink fluids without vomiting.  Your symptoms get worse with treatment.  You have a fever.  You have a severe headache.  You have vomiting or diarrhea that: ? Gets worse. ? Does not go away.  You have blood or green matter (bile) in your vomit.  You have blood in your stool. This may cause stool to look black and tarry.  You have not urinated in 6-8 hours.  You faint.  Your heart rate while sitting still is over 100 beats a minute.  You have trouble breathing. This information is not intended to replace advice given to you by your health care provider. Make sure you discuss any questions you have with your health care provider. Document Released: 11/19/2005 Document Revised: 06/15/2016 Document Reviewed: 01/13/2016 Elsevier Interactive Patient Education  [employer].

## 2017-05-24 NOTE — Progress Notes (Signed)
Pt has healing area of shingles on his right forehead. No blisters, crusting is also gone. But pt c/o itching and tenderness to healing skin.  He scratched a couple of places raw and is asking for something to ease the discomfort.  Advised patient and wife to try using cool moist cloths on affected area as often as needed to relief the itching/pain; also to try a paste made of corn starch and baking soda and water; unscented loitions such lubriderm or aveeno.  Printed information provided to pt and wife.

## 2017-05-27 ENCOUNTER — Telehealth: Payer: Self-pay | Admitting: *Deleted

## 2017-05-27 ENCOUNTER — Ambulatory Visit: Payer: Medicare PPO

## 2017-05-27 ENCOUNTER — Encounter: Payer: Medicare PPO | Admitting: Nutrition

## 2017-05-27 NOTE — Telephone Encounter (Signed)
Oncology Nurse Navigator Documentation  Called patient wife after discussion with [facility] RN Drucie Ip.  Based on husband's earlier indication of single episode of N&V and his ability to drink/instill water along with supplement, provided guidance he should continue as he described, not necessary to come in tomorrow but keep Wed 0900 appt.  Encouraged taking of antiemetics if nausea recurs.  She voiced understanding.  Gayleen Orem, RN, BSN, [place] Oncology Nurse [place] at [place] [phone]

## 2017-05-27 NOTE — Telephone Encounter (Signed)
Oncology Nurse Navigator Documentation  Received call from patient, he said he woke this morning w/ N&V, cannot make it in for 0900 IVF in [facility].  I encouraged him to come in noting he should be evaluated.  He indicated he would try to arrive later.  I called him 10:25 s/p VM from [place], indicated he as long as he arrives around 11:00, he can receive IVF.  LVMM on his phone, unable to LVM on wife's phone.  Gayleen Orem, RN, BSN, [place] Oncology Nurse [place] at [place] [phone]

## 2017-05-28 ENCOUNTER — Telehealth: Payer: Self-pay | Admitting: *Deleted

## 2017-05-28 MED FILL — valACYclovir HCL 1 GM TABS: 1 | 10 days supply | Qty: 30 | Fill #0

## 2017-05-28 MED FILL — ERYTHROMYCIN EYE OINTMENT: 5 | 30 days supply | Qty: 4 | Fill #0

## 2017-05-28 NOTE — Telephone Encounter (Signed)
Oncology Nurse Navigator Documentation  Received call from patient wife.  She stated patient is seeing his ophthalmologist, Dr. Marshall Cork, this afternoon and he requests documentation of patient's recent visits that addressed shingles outbreak and eye infection.  To that end, I faxed Dr. Calton Dach 6/5, 6/13 and 6/20 Progress Notes.  Confirmed with Dr. Kathleen Argue office fax received.  Gayleen Orem, RN, BSN, [place] Oncology Nurse [place] at [place] [phone]

## 2017-05-29 ENCOUNTER — Ambulatory Visit (HOSPITAL_BASED_OUTPATIENT_CLINIC_OR_DEPARTMENT_OTHER): Payer: Medicare PPO

## 2017-05-29 ENCOUNTER — Telehealth: Payer: Self-pay | Admitting: Hematology and Oncology

## 2017-05-29 ENCOUNTER — Ambulatory Visit (HOSPITAL_BASED_OUTPATIENT_CLINIC_OR_DEPARTMENT_OTHER): Payer: Non-veteran care | Admitting: Hematology and Oncology

## 2017-05-29 VITALS — BP 145/78 | HR 80 | Resp 18 | Wt 137.3 lb

## 2017-05-29 DIAGNOSIS — Z95828 Presence of other vascular implants and grafts: Secondary | ICD-10-CM

## 2017-05-29 DIAGNOSIS — D6481 Anemia due to antineoplastic chemotherapy: Secondary | ICD-10-CM | POA: Diagnosis not present

## 2017-05-29 DIAGNOSIS — C021 Malignant neoplasm of border of tongue: Secondary | ICD-10-CM | POA: Diagnosis not present

## 2017-05-29 DIAGNOSIS — R1310 Dysphagia, unspecified: Secondary | ICD-10-CM

## 2017-05-29 DIAGNOSIS — B029 Zoster without complications: Secondary | ICD-10-CM | POA: Diagnosis not present

## 2017-05-29 DIAGNOSIS — T451X5A Adverse effect of antineoplastic and immunosuppressive drugs, initial encounter: Secondary | ICD-10-CM

## 2017-05-29 MED ORDER — SODIUM CHLORIDE 0.9% FLUSH
10.0000 mL | INTRAVENOUS | Status: DC | PRN
  Administered 2017-05-29: 10 mL
  Filled 2017-05-29: qty 10

## 2017-05-29 MED ORDER — HEPARIN SOD (PORK) LOCK FLUSH 100 UNIT/ML IV SOLN
500.0000 [IU] | Freq: Once | INTRAVENOUS | Status: AC | PRN
  Administered 2017-05-29: 500 [IU]
  Filled 2017-05-29: qty 5

## 2017-05-29 MED ORDER — SODIUM CHLORIDE 0.9 % IV SOLN
Freq: Once | INTRAVENOUS | Status: AC
  Administered 2017-05-29: 10:00:00 via INTRAVENOUS

## 2017-05-29 NOTE — Progress Notes (Signed)
Noted scabbed areas on right forehead. Pt and wife state he has been scratching his forehead as his shingles heals.  This RN had given wife and pt instructions on several options to help with itching. They only used the baking soda paste without much relief.  Advised to try the alsohol free cream/lotions and cool compresses.  Reviewed again with pt about eating soft foods at least 3 x a day.  He has not eaten much in last couple of days.  Pt and wife need much reinforcement. Pt also being followed by Pamala Hurry Neff-dietician

## 2017-05-29 NOTE — Patient Instructions (Signed)
Dehydration, Adult Dehydration is a condition in which there is not enough fluid or water in the body. This happens when you lose more fluids than you take in. Important organs, such as the kidneys, brain, and heart, cannot function without a proper amount of fluids. Any loss of fluids from the body can lead to dehydration. Dehydration can range from mild to severe. This condition should be treated right away to prevent it from becoming severe. What are the causes? This condition may be caused by:  Vomiting.  Diarrhea.  Excessive sweating, such as from heat exposure or exercise.  Not drinking enough fluid, especially: ? When ill. ? While doing activity that requires a lot of energy.  Excessive urination.  Fever.  Infection.  Certain medicines, such as medicines that cause the body to lose excess fluid (diuretics).  Inability to access safe drinking water.  Reduced physical ability to get adequate water and food.  What increases the risk? This condition is more likely to develop in people:  Who have a poorly controlled long-term (chronic) illness, such as diabetes, heart disease, or kidney disease.  Who are age 65 or older.  Who are disabled.  Who live in a place with high altitude.  Who play endurance sports.  What are the signs or symptoms? Symptoms of mild dehydration may include:  Thirst.  Dry lips.  Slightly dry mouth.  Dry, warm skin.  Dizziness. Symptoms of moderate dehydration may include:  Very dry mouth.  Muscle cramps.  Dark urine. Urine may be the color of tea.  Decreased urine production.  Decreased tear production.  Heartbeat that is irregular or faster than normal (palpitations).  Headache.  Light-headedness, especially when you stand up from a sitting position.  Fainting (syncope). Symptoms of severe dehydration may include:  Changes in skin, such as: ? Cold and clammy skin. ? Blotchy (mottled) or pale skin. ? Skin that does  not quickly return to normal after being lightly pinched and released (poor skin turgor).  Changes in body fluids, such as: ? Extreme thirst. ? No tear production. ? Inability to sweat when body temperature is high, such as in hot weather. ? Very little urine production.  Changes in vital signs, such as: ? Weak pulse. ? Pulse that is more than 100 beats a minute when sitting still. ? Rapid breathing. ? Low blood pressure.  Other changes, such as: ? Sunken eyes. ? Cold hands and feet. ? Confusion. ? Lack of energy (lethargy). ? Difficulty waking up from sleep. ? Short-term weight loss. ? Unconsciousness. How is this diagnosed? This condition is diagnosed based on your symptoms and a physical exam. Blood and urine tests may be done to help confirm the diagnosis. How is this treated? Treatment for this condition depends on the severity. Mild or moderate dehydration can often be treated at home. Treatment should be started right away. Do not wait until dehydration becomes severe. Severe dehydration is an emergency and it needs to be treated in a hospital. Treatment for mild dehydration may include:  Drinking more fluids.  Replacing salts and minerals in your blood (electrolytes) that you may have lost. Treatment for moderate dehydration may include:  Drinking an oral rehydration solution (ORS). This is a drink that helps you replace fluids and electrolytes (rehydrate). It can be found at pharmacies and retail stores. Treatment for severe dehydration may include:  Receiving fluids through an IV tube.  Receiving an electrolyte solution through a feeding tube that is passed through your nose   and into your stomach (nasogastric tube, or NG tube).  Correcting any abnormalities in electrolytes.  Treating the underlying cause of dehydration. Follow these instructions at home:  If directed by your health care provider, drink an ORS: ? Make an ORS by following instructions on the  package. ? Start by drinking small amounts, about  cup (120 mL) every 5-10 minutes. ? Slowly increase how much you drink until you have taken the amount recommended by your health care provider.  Drink enough clear fluid to keep your urine clear or pale yellow. If you were told to drink an ORS, finish the ORS first, then start slowly drinking other clear fluids. Drink fluids such as: ? Water. Do not drink only water. Doing that can lead to having too little salt (sodium) in the body (hyponatremia). ? Ice chips. ? Fruit juice that you have added water to (diluted fruit juice). ? Low-calorie sports drinks.  Avoid: ? Alcohol. ? Drinks that contain a lot of sugar. These include high-calorie sports drinks, fruit juice that is not diluted, and soda. ? Caffeine. ? Foods that are greasy or contain a lot of fat or sugar.  Take over-the-counter and prescription medicines only as told by your health care provider.  Do not take sodium tablets. This can lead to having too much sodium in the body (hypernatremia).  Eat foods that contain a healthy balance of electrolytes, such as bananas, oranges, potatoes, tomatoes, and spinach.  Keep all follow-up visits as told by your health care provider. This is important. Contact a health care provider if:  You have abdominal pain that: ? Gets worse. ? Stays in one area (localizes).  You have a rash.  You have a stiff neck.  You are more irritable than usual.  You are sleepier or more difficult to wake up than usual.  You feel weak or dizzy.  You feel very thirsty.  You have urinated only a small amount of very dark urine over 6-8 hours. Get help right away if:  You have symptoms of severe dehydration.  You cannot drink fluids without vomiting.  Your symptoms get worse with treatment.  You have a fever.  You have a severe headache.  You have vomiting or diarrhea that: ? Gets worse. ? Does not go away.  You have blood or green matter  (bile) in your vomit.  You have blood in your stool. This may cause stool to look black and tarry.  You have not urinated in 6-8 hours.  You faint.  Your heart rate while sitting still is over 100 beats a minute.  You have trouble breathing. This information is not intended to replace advice given to you by your health care provider. Make sure you discuss any questions you have with your health care provider. Document Released: 11/19/2005 Document Revised: 06/15/2016 Document Reviewed: 01/13/2016 Elsevier Interactive Patient Education  2018 [employer]  

## 2017-05-29 NOTE — Telephone Encounter (Signed)
Gave patient calendar for July appointment. Patient will get avs report after [facility] visit.

## 2017-05-30 ENCOUNTER — Encounter: Payer: Self-pay | Admitting: Hematology and Oncology

## 2017-05-30 NOTE — Assessment & Plan Note (Signed)
He has recently completed treatment and side-effects of therapy are subsiding I am reducing the frequency of IV fluids next week

## 2017-05-30 NOTE — Progress Notes (Signed)
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 This caused him significant discomfort, resulting in decreased food intake.      12/28/2016 Imaging    CT of the neck on 12/28/16 had demonstrate no frank adenopathy in the neck and the tongue was not well visualized die to dental work. On the same day a CT of his chest showed emphysema of the lungs and a few nonspecific pulmonary nodules which were unchanged compared to 06/2015.      01/15/2017 PET scan    This revealed hypermetabolic activity in the left anterior tongue as well as within a small left level 2 node and small nodules in the left parotid gland. No evidence of metastatic disease distantly      01/28/2017 Pathology Results    A.TONGUE, LEFT PARTIAL GLOSSECTOMY: Invasive moderate to poorly-differentiated squamous cell carcinoma, conventional type, 2.5 cm in greatest dimension on gross examination. Invasive squamous cell carcinoma shows greatest depth of invasion of 1.5 cm. Margins uninvolved by invasive tumor (invasive squamous cell carcinoma comes to within 0.6 cm of the inked posterior margin). Negative for lymphovascular invasion Negative for perineural invasion. AJCC Pathologic Stage: pT3 pN3 pM- not applicable. See Cancer Case Summary.  SURGICAL PATHOLOGY CANCER CASE SUMMARY- LIP AND ORAL CAVITY Protocol posting date: June 2017 Procedure: Partial glossectomy Tumor site: Oral, tongue Tumor laterality: left Tumor focality: unifocal Tumor size: Greatest dimension 2.5 cm on gross examination Histologic type: Squamous cell carcinoma, conventional Histologic grade: G2-G3, moderate to poorly differentiated Specimen margins:  Uninvolved by invasive tumor Distance from closest margin: 6 mm (0.6 cm) from posterior margin Lymphovascular invasion: Not identified Perineural invasion: Not identified Regional lymph nodes: Number of lymph nodes involved: 3 Number of lymph nodes examined: 35 Laterality of lymph nodes involved: Bilateral Size of largest metastatic deposit: 9 mm (0.9 cm) Extranodal extension: very focally present in 1 lymph node (part J) Pathologic Stage Classification (pTNM, AJCC 8th Edition) Primary tumor (pT): pT3 Regional lymph nodes (pN): pN3 Distant metastasis (pM): not applicable   B.ANTERIOR DORSAL TONGUE, BIOPSY: Negative for malignancy.  C.POSTERIOR DORSAL TONGUE, BIOPSY: Negative for malignancy.  D.MID DORSAL TONGUE, BIOPSY: Negative for malignancy.  E.DEEP TONGUE, BIOPSY: Negative for malignancy.  F.ANTERIOR FLOOR OF MOUTH, BIOPSY: Negative for malignancy.  G.POSTERIOR FLOOR OF MOUTH, BIOPSY: Negative for malignancy.  H.LEVEL IA, RESECTION: Six benign lymph nodes negative for metastatic carcinoma on routine H&E staining (0/6).  I.LEVEL IB, RESECTION: Two benign lymph nodes negative for metastatic carcinoma on routine H&E staining (0/2). Benign salivary gland tissue.  J.LEFT LEVEL IIA , 3 AND 4, RESECTION: One of twenty-one lymph nodes positive for metastatic carcinoma (0.9 cm focus) with very focal extranodal extension on routine H&E staining (1/21).  K.LEVEL IIB, RESECTION: Four benign lymph nodes negative for metastatic carcinoma on routine H&E staining (0/4). One small focus of benign salivary gland tissue.  L.RIGHT LEVEL IB, RESECTION: Three benign lymph nodes negative for  metastatic carcinoma on routine H&E staining (0/3). Benign salivary gland tissue.  M.RIGHT LEVEL 2A, 3, 4, RESECTION: Two of fifteen lymph nodes positive for metastatic carcinoma (2/15). See comment.  Comment: A small focus suspicious for metastatic carcinoma is seen on the initial H&E stained slide. Therefore, a pankeratin immunohistochemical stain  is performed. The positive control stain functioned appropriately. The pankeratin immunostain highlights a few minute foci of metastatic carcinoma in 2 of the 3 lymph nodes (largest focus 0.2 mm [0.02 cm]).      01/28/2017 Surgery    He had surgery at [facility] <100 SQCM [15100] (SPLIT THICKNESS SKIN GRAFT LEG)           03/01/2017 Procedure    Successful fluoroscopic guided percutaneous gastrostomy tube placement.      03/01/2017 Procedure    Placement of a subcutaneous port device. Catheter tip at the superior cavoatrial junction and ready to be used.      03/12/2017 - 04/25/2017 Radiation Therapy    Received IMRT / 6 MV photons to tongue and bilateral neck / 60 Gy in 30 fractions.      03/13/2017 Procedure    Baseline hearing is near normal      03/15/2017 - 04/12/2017 Chemotherapy    He received cisplatin x 5 weekly doses      04/17/2017 - 04/25/2017 Hospital Admission    He was admitted to the hospital for management of severe mucositis pain, dehydration, dizziness and uncontrolled nausea       INTERVAL HISTORY: Please see below for problem oriented charting. He returns for further follow-up. His shingle rash is healing but had been bleeding intermittently It bled because he has been scratching it He denies mouth pain He is not eating much by mouth because of dysphagia Denies recent nausea or vomiting.  REVIEW OF SYSTEMS:   Constitutional: Denies fevers, chills or abnormal weight loss Eyes:  Denies blurriness of vision Ears, nose, mouth, throat, and face: Denies mucositis or sore throat Respiratory: Denies cough, dyspnea or wheezes Cardiovascular: Denies palpitation, chest discomfort or lower extremity swelling Gastrointestinal:  Denies nausea, heartburn or change in bowel habits Lymphatics: Denies new lymphadenopathy or easy bruising Neurological:Denies numbness, tingling or new weaknesses Behavioral/Psych: Mood is stable, no new changes  All other systems were reviewed with the patient and are negative.  I have reviewed the past medical history, past surgical history, social history and family history with the patient and they are unchanged from previous note.  ALLERGIES:  is allergic to latex; adhesive [tape]; and codeine.  MEDICATIONS:  Current Outpatient Prescriptions  Medication Sig Dispense Refill  . aspirin 81 MG chewable tablet Chew 81 mg by mouth every morning.     . fentaNYL (DURAGESIC - DOSED MCG/HR) 12 MCG/HR Place 3 patches (37.5 mcg total) onto the skin every 3 (three) days. 10 patch 0  . lidocaine-prilocaine (EMLA) cream Apply to affected area once 30 g 3  . magic mouthwash w/lidocaine SOLN Take 5 mLs by mouth 4 (four) times daily as needed for mouth pain. 240 mL 0  . magnesium oxide (MAG-OX) 400 (241.3 Mg) MG tablet Take 1 tablet (400 mg total) by mouth 2 (two) times daily. 60 tablet 9  . morphine (ROXANOL) 20 MG/ML concentrated solution Take 1 mL (20 mg total) by mouth every 2 (two) hours as needed for severe pain. 240 mL 0  . ondansetron (ZOFRAN) 8 MG tablet Take 1 tablet (8 mg total) by mouth every 8 (eight) hours as needed. Start on the third day after chemotherapy. (Patient not taking: Reported on 03/21/2017) 30 tablet 1  . prochlorperazine (COMPAZINE) 10 MG tablet Take 1 tablet (10 mg total) by mouth every 6 (six) hours as needed (Nausea or vomiting). 30 tablet  1  . scopolamine (TRANSDERM-SCOP) 1 MG/3DAYS Place 1 patch (1.5 mg total) onto the skin every 3  (three) days. (Patient not taking: Reported on 05/29/2017) 10 patch 12   No current facility-administered medications for this visit.     PHYSICAL EXAMINATION: ECOG PERFORMANCE STATUS: 1 - Symptomatic but completely ambulatory  Vitals:   05/29/17 0959  BP: (!) 145/78  Pulse: 80  Resp: 18   Filed Weights   05/29/17 0959  Weight: 137 lb (62.1 kg)    GENERAL:alert, no distress and comfortable SKIN the rash on his face is well crusted but noted some signs of recent bleeding EYES: normal, Conjunctiva are pink and non-injected, sclera clear OROPHARYNX:no exudate, no erythema and lips, buccal mucosa, and tongue normal  NECK: supple, thyroid normal size, non-tender, without nodularity LYMPH:  no palpable lymphadenopathy in the cervical, axillary or inguinal LUNGS: clear to auscultation and percussion with normal breathing effort HEART: regular rate & rhythm and no murmurs and no lower extremity edema ABDOMEN:abdomen soft, non-tender and normal bowel sounds Musculoskeletal:no cyanosis of digits and no clubbing  NEURO: alert & oriented x 3 with fluent speech, no focal motor/sensory deficits  LABORATORY DATA:  I have reviewed the data as listed    Component Value Date/Time   NA 136 05/24/2017 0938   K 3.6 05/24/2017 0938   CL 97 (L) 04/23/2017 0430   CO2 32 (H) 05/24/2017 0938   GLUCOSE 118 05/24/2017 0938   BUN 18.5 05/24/2017 0938   CREATININE 0.8 05/24/2017 0938   CALCIUM 9.4 05/24/2017 0938   PROT 6.8 05/24/2017 0938   ALBUMIN 3.3 (L) 05/24/2017 0938   AST 27 05/24/2017 0938   ALT 30 05/24/2017 0938   ALKPHOS 56 05/24/2017 0938   BILITOT 0.67 05/24/2017 0938   GFRNONAA >60 04/23/2017 0430   GFRAA >60 04/23/2017 0430    No results found for: SPEP, UPEP  Lab Results  Component Value Date   WBC 7.8 05/24/2017   NEUTROABS 6.0 05/24/2017   HGB 11.8 (L) 05/24/2017   HCT 35.7 (L) 05/24/2017   MCV 95.9 05/24/2017   PLT 213 05/24/2017      Chemistry      Component  Value Date/Time   NA 136 05/24/2017 0938   K 3.6 05/24/2017 0938   CL 97 (L) 04/23/2017 0430   CO2 32 (H) 05/24/2017 0938   BUN 18.5 05/24/2017 0938   CREATININE 0.8 05/24/2017 0938      Component Value Date/Time   CALCIUM 9.4 05/24/2017 0938   ALKPHOS 56 05/24/2017 0938   AST 27 05/24/2017 0938   ALT 30 05/24/2017 0938   BILITOT 0.67 05/24/2017 0938      ASSESSMENT & PLAN:  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[facility]) He has recently completed treatment and side-effects of therapy are subsiding I am reducing the frequency of IV fluids next week   Shingles rash The rash is healing but the patient has been scratching on it causing it to bleed We discussed conservative management with topical emollient cream to reduce itchiness  Anemia due to antineoplastic chemotherapy This is likely anemia of chronic disease. The patient denies recent history of bleeding such as epistaxis, hematuria or hematochezia. He is asymptomatic from the anemia. We will observe for now.    Dysphagia He has persistent swallowing difficulties and is not motivated to eat by mouth I continue to encourage him to increase oral intake as tolerated to wean him off his feeding tube.   No orders of the  defined types were placed in this encounter.  All questions were answered. The patient knows to call the clinic with any problems, questions or concerns. No barriers to learning was detected. I spent 15 minutes counseling the patient face to face. The total time spent in the appointment was 20 minutes and more than 50% was on counseling and review of test results     Heath Lark, MD 05/30/2017 2:34 PM

## 2017-05-30 NOTE — Assessment & Plan Note (Signed)
This is likely anemia of chronic disease. The patient denies recent history of bleeding such as epistaxis, hematuria or hematochezia. He is asymptomatic from the anemia. We will observe for now.  

## 2017-05-30 NOTE — Assessment & Plan Note (Signed)
He has persistent swallowing difficulties and is not motivated to eat by mouth I continue to encourage him to increase oral intake as tolerated to wean him off his feeding tube.

## 2017-05-30 NOTE — Assessment & Plan Note (Signed)
The rash is healing but the patient has been scratching on it causing it to bleed We discussed conservative management with topical emollient cream to reduce itchiness

## 2017-05-31 ENCOUNTER — Ambulatory Visit (HOSPITAL_BASED_OUTPATIENT_CLINIC_OR_DEPARTMENT_OTHER): Payer: Medicare PPO

## 2017-05-31 VITALS — BP 136/83 | HR 64 | Temp 97.6°F | Resp 18 | Ht 68.0 in | Wt 137.9 lb

## 2017-05-31 DIAGNOSIS — C021 Malignant neoplasm of border of tongue: Secondary | ICD-10-CM

## 2017-05-31 DIAGNOSIS — Z95828 Presence of other vascular implants and grafts: Secondary | ICD-10-CM

## 2017-05-31 MED ORDER — SODIUM CHLORIDE 0.9% FLUSH
10.0000 mL | INTRAVENOUS | Status: DC | PRN
  Administered 2017-05-31: 10 mL
  Filled 2017-05-31: qty 10

## 2017-05-31 MED ORDER — SODIUM CHLORIDE 0.9 % IV SOLN
Freq: Once | INTRAVENOUS | Status: AC
  Administered 2017-05-31: 12:00:00 via INTRAVENOUS

## 2017-05-31 MED ORDER — HEPARIN SOD (PORK) LOCK FLUSH 100 UNIT/ML IV SOLN
500.0000 [IU] | Freq: Once | INTRAVENOUS | Status: AC | PRN
  Administered 2017-05-31: 500 [IU]
  Filled 2017-05-31: qty 5

## 2017-05-31 NOTE — Patient Instructions (Signed)
Dehydration, Adult Dehydration is a condition in which there is not enough fluid or water in the body. This happens when you lose more fluids than you take in. Important organs, such as the kidneys, brain, and heart, cannot function without a proper amount of fluids. Any loss of fluids from the body can lead to dehydration. Dehydration can range from mild to severe. This condition should be treated right away to prevent it from becoming severe. What are the causes? This condition may be caused by:  Vomiting.  Diarrhea.  Excessive sweating, such as from heat exposure or exercise.  Not drinking enough fluid, especially: ? When ill. ? While doing activity that requires a lot of energy.  Excessive urination.  Fever.  Infection.  Certain medicines, such as medicines that cause the body to lose excess fluid (diuretics).  Inability to access safe drinking water.  Reduced physical ability to get adequate water and food.  What increases the risk? This condition is more likely to develop in people:  Who have a poorly controlled long-term (chronic) illness, such as diabetes, heart disease, or kidney disease.  Who are age 65 or older.  Who are disabled.  Who live in a place with high altitude.  Who play endurance sports.  What are the signs or symptoms? Symptoms of mild dehydration may include:  Thirst.  Dry lips.  Slightly dry mouth.  Dry, warm skin.  Dizziness. Symptoms of moderate dehydration may include:  Very dry mouth.  Muscle cramps.  Dark urine. Urine may be the color of tea.  Decreased urine production.  Decreased tear production.  Heartbeat that is irregular or faster than normal (palpitations).  Headache.  Light-headedness, especially when you stand up from a sitting position.  Fainting (syncope). Symptoms of severe dehydration may include:  Changes in skin, such as: ? Cold and clammy skin. ? Blotchy (mottled) or pale skin. ? Skin that does  not quickly return to normal after being lightly pinched and released (poor skin turgor).  Changes in body fluids, such as: ? Extreme thirst. ? No tear production. ? Inability to sweat when body temperature is high, such as in hot weather. ? Very little urine production.  Changes in vital signs, such as: ? Weak pulse. ? Pulse that is more than 100 beats a minute when sitting still. ? Rapid breathing. ? Low blood pressure.  Other changes, such as: ? Sunken eyes. ? Cold hands and feet. ? Confusion. ? Lack of energy (lethargy). ? Difficulty waking up from sleep. ? Short-term weight loss. ? Unconsciousness. How is this diagnosed? This condition is diagnosed based on your symptoms and a physical exam. Blood and urine tests may be done to help confirm the diagnosis. How is this treated? Treatment for this condition depends on the severity. Mild or moderate dehydration can often be treated at home. Treatment should be started right away. Do not wait until dehydration becomes severe. Severe dehydration is an emergency and it needs to be treated in a hospital. Treatment for mild dehydration may include:  Drinking more fluids.  Replacing salts and minerals in your blood (electrolytes) that you may have lost. Treatment for moderate dehydration may include:  Drinking an oral rehydration solution (ORS). This is a drink that helps you replace fluids and electrolytes (rehydrate). It can be found at pharmacies and retail stores. Treatment for severe dehydration may include:  Receiving fluids through an IV tube.  Receiving an electrolyte solution through a feeding tube that is passed through your nose   and into your stomach (nasogastric tube, or NG tube).  Correcting any abnormalities in electrolytes.  Treating the underlying cause of dehydration. Follow these instructions at home:  If directed by your health care provider, drink an ORS: ? Make an ORS by following instructions on the  package. ? Start by drinking small amounts, about  cup (120 mL) every 5-10 minutes. ? Slowly increase how much you drink until you have taken the amount recommended by your health care provider.  Drink enough clear fluid to keep your urine clear or pale yellow. If you were told to drink an ORS, finish the ORS first, then start slowly drinking other clear fluids. Drink fluids such as: ? Water. Do not drink only water. Doing that can lead to having too little salt (sodium) in the body (hyponatremia). ? Ice chips. ? Fruit juice that you have added water to (diluted fruit juice). ? Low-calorie sports drinks.  Avoid: ? Alcohol. ? Drinks that contain a lot of sugar. These include high-calorie sports drinks, fruit juice that is not diluted, and soda. ? Caffeine. ? Foods that are greasy or contain a lot of fat or sugar.  Take over-the-counter and prescription medicines only as told by your health care provider.  Do not take sodium tablets. This can lead to having too much sodium in the body (hypernatremia).  Eat foods that contain a healthy balance of electrolytes, such as bananas, oranges, potatoes, tomatoes, and spinach.  Keep all follow-up visits as told by your health care provider. This is important. Contact a health care provider if:  You have abdominal pain that: ? Gets worse. ? Stays in one area (localizes).  You have a rash.  You have a stiff neck.  You are more irritable than usual.  You are sleepier or more difficult to wake up than usual.  You feel weak or dizzy.  You feel very thirsty.  You have urinated only a small amount of very dark urine over 6-8 hours. Get help right away if:  You have symptoms of severe dehydration.  You cannot drink fluids without vomiting.  Your symptoms get worse with treatment.  You have a fever.  You have a severe headache.  You have vomiting or diarrhea that: ? Gets worse. ? Does not go away.  You have blood or green matter  (bile) in your vomit.  You have blood in your stool. This may cause stool to look black and tarry.  You have not urinated in 6-8 hours.  You faint.  Your heart rate while sitting still is over 100 beats a minute.  You have trouble breathing. This information is not intended to replace advice given to you by your health care provider. Make sure you discuss any questions you have with your health care provider. Document Released: 11/19/2005 Document Revised: 06/15/2016 Document Reviewed: 01/13/2016 Elsevier Interactive Patient Education  2018 [employer]  

## 2017-05-31 NOTE — Progress Notes (Signed)
Last scheduled IV fluid unless pt calls requesting.  Pt and wife verbalize understanding of importance of fluids either by mouth or via PEG tube. As well as food - by mouth or via PEG.  Gayleen Orem,  H&N Navigator is to see pt as well. Presented pt with Perserverence Award for getting through the last several weeks of IV fluids and progressing with his oral intake.  Pt in good spirits. F/u appts in place

## 2017-06-10 ENCOUNTER — Telehealth: Payer: Self-pay | Admitting: *Deleted

## 2017-06-10 NOTE — Telephone Encounter (Signed)
Oncology Nurse Navigator Documentation  LVMM on patient and wife's phone with reminder of [facility] tomorrow morning with 0845 arrival to Radiation Waiting following lobby registration.  Gayleen Orem, RN, BSN, [place] [facility] Oncology Nurse [place] at [place] [phone]

## 2017-06-11 ENCOUNTER — Encounter: Payer: Non-veteran care | Admitting: Nutrition

## 2017-06-11 ENCOUNTER — Ambulatory Visit: Payer: Medicare PPO

## 2017-06-12 ENCOUNTER — Telehealth: Payer: Self-pay

## 2017-06-12 ENCOUNTER — Other Ambulatory Visit: Payer: Self-pay | Admitting: Radiation Oncology

## 2017-06-12 DIAGNOSIS — C021 Malignant neoplasm of border of tongue: Secondary | ICD-10-CM

## 2017-06-12 MED ORDER — RANITIDINE HCL 150 MG PO TABS: 150.0000 mg | ORAL_TABLET | Freq: Two times a day (BID) | ORAL | 0 refills | Status: DC

## 2017-06-12 MED ORDER — DEXAMETHASONE 4 MG PO TABS: ORAL_TABLET | ORAL | 0 refills | Status: DC

## 2017-06-12 NOTE — Telephone Encounter (Signed)
I received a phone call from Ms. Boback this morning. She reported that [name] had developed neck tightness and has been unable to swallow today. She denies that he has any breathing difficulties. She requested that I ask Dr. Isidore Moos for a steroid to help. He had completed a course of steroids 3 days ago ordered by Dr. Isidore Moos. I contacted Dr. Isidore Moos and she notified me that she had sent in a prescription to his pharmacy for decadron and rantidine. I called and spoke to [name] and informed him that the prescriptions had been called in. I informed that the rantidine was to protect his stomach from the steroid. I also advised him to take the decadron with a nutritional supplement. They know to call if it becomes worse and to go to the ED if he developed breathing difficulties. He voiced his understanding of the above. He will follow up with Dr. Alvy Bimler next week.

## 2017-06-13 ENCOUNTER — Ambulatory Visit: Payer: Medicare PPO | Attending: Radiation Oncology | Admitting: Physical Therapy

## 2017-06-13 ENCOUNTER — Emergency Department (HOSPITAL_COMMUNITY)
Admission: EM | Admit: 2017-06-13 | Discharge: 2017-06-13 | Disposition: A | Discharge status: Home / Self Care | Payer: Medicare PPO | Attending: Emergency Medicine | Admitting: Emergency Medicine

## 2017-06-13 ENCOUNTER — Emergency Department (HOSPITAL_COMMUNITY): Payer: Medicare PPO

## 2017-06-13 ENCOUNTER — Encounter (HOSPITAL_COMMUNITY): Payer: Self-pay | Admitting: Emergency Medicine

## 2017-06-13 ENCOUNTER — Telehealth: Payer: Self-pay | Admitting: *Deleted

## 2017-06-13 DIAGNOSIS — Z91040 Latex allergy status: Secondary | ICD-10-CM | POA: Diagnosis not present

## 2017-06-13 DIAGNOSIS — J449 Chronic obstructive pulmonary disease, unspecified: Secondary | ICD-10-CM | POA: Diagnosis not present

## 2017-06-13 DIAGNOSIS — B370 Candidal stomatitis: Secondary | ICD-10-CM | POA: Diagnosis not present

## 2017-06-13 DIAGNOSIS — Z7982 Long term (current) use of aspirin: Secondary | ICD-10-CM | POA: Diagnosis not present

## 2017-06-13 DIAGNOSIS — I10 Essential (primary) hypertension: Secondary | ICD-10-CM | POA: Insufficient documentation

## 2017-06-13 DIAGNOSIS — Z87891 Personal history of nicotine dependence: Secondary | ICD-10-CM | POA: Insufficient documentation

## 2017-06-13 DIAGNOSIS — L599 Disorder of the skin and subcutaneous tissue related to radiation, unspecified: Secondary | ICD-10-CM | POA: Insufficient documentation

## 2017-06-13 DIAGNOSIS — Z85828 Personal history of other malignant neoplasm of skin: Secondary | ICD-10-CM | POA: Insufficient documentation

## 2017-06-13 DIAGNOSIS — I890 Lymphedema, not elsewhere classified: Secondary | ICD-10-CM | POA: Insufficient documentation

## 2017-06-13 DIAGNOSIS — R51 Headache: Secondary | ICD-10-CM | POA: Diagnosis present

## 2017-06-13 DIAGNOSIS — W19XXXA Unspecified fall, initial encounter: Secondary | ICD-10-CM

## 2017-06-13 LAB — COMPREHENSIVE METABOLIC PANEL
ALK PHOS: 51 U/L (ref 38–126)
ALT: 19 U/L (ref 17–63)
AST: 22 U/L (ref 15–41)
Albumin: 3.6 g/dL (ref 3.5–5.0)
Anion gap: 10 (ref 5–15)
BUN: 29 mg/dL — AB (ref 6–20)
CALCIUM: 9.2 mg/dL (ref 8.9–10.3)
CO2: 27 mmol/L (ref 22–32)
CREATININE: 0.83 mg/dL (ref 0.61–1.24)
Chloride: 96 mmol/L — ABNORMAL LOW (ref 101–111)
GFR calc non Af Amer: >60 mL/min (ref >60)
GLUCOSE: 101 mg/dL — AB (ref 65–99)
Potassium: 4.4 mmol/L (ref 3.5–5.1)
SODIUM: 133 mmol/L — AB (ref 135–145)
Total Bilirubin: 0.7 mg/dL (ref 0.3–1.2)
Total Protein: 7.4 g/dL (ref 6.5–8.1)

## 2017-06-13 LAB — URINALYSIS, ROUTINE W REFLEX MICROSCOPIC
BACTERIA UA: NONE SEEN
BILIRUBIN URINE: NEGATIVE
GLUCOSE, UA: NEGATIVE mg/dL
HGB URINE DIPSTICK: NEGATIVE
KETONES UR: NEGATIVE mg/dL
LEUKOCYTES UA: NEGATIVE
Nitrite: NEGATIVE
PROTEIN: 30 mg/dL — AB
Specific Gravity, Urine: 1.019 (ref 1.005–1.030)
pH: 7 (ref 5.0–8.0)

## 2017-06-13 LAB — CBC WITH DIFFERENTIAL/PLATELET
Basophils Absolute: 0 10*3/uL (ref 0.0–0.1)
Basophils Relative: 0 %
EOS ABS: 0 10*3/uL (ref 0.0–0.7)
Eosinophils Relative: 0 %
HCT: 35 % — ABNORMAL LOW (ref 39.0–52.0)
HEMOGLOBIN: 12 g/dL — AB (ref 13.0–17.0)
LYMPHS ABS: 0.3 10*3/uL — AB (ref 0.7–4.0)
LYMPHS PCT: 5 %
MCH: 32.8 pg (ref 26.0–34.0)
MCHC: 34.3 g/dL (ref 30.0–36.0)
MCV: 95.6 fL (ref 78.0–100.0)
Monocytes Absolute: 0.6 10*3/uL (ref 0.1–1.0)
Monocytes Relative: 9 %
NEUTROS PCT: 86 %
Neutro Abs: 5.6 10*3/uL (ref 1.7–7.7)
Platelets: 274 10*3/uL (ref 150–400)
RBC: 3.66 MIL/uL — AB (ref 4.22–5.81)
RDW: 16.1 % — ABNORMAL HIGH (ref 11.5–15.5)
WBC: 6.5 10*3/uL (ref 4.0–10.5)

## 2017-06-13 LAB — I-STAT TROPONIN, ED: TROPONIN I, POC: 0 ng/mL (ref 0.00–0.08)

## 2017-06-13 LAB — I-STAT CG4 LACTIC ACID, ED: Lactic Acid, Venous: 0.82 mmol/L (ref 0.5–1.9)

## 2017-06-13 MED ORDER — MAGIC MOUTHWASH W/LIDOCAINE: 5.0000 mL | Freq: Four times a day (QID) | ORAL | 0 refills | Status: DC | PRN

## 2017-06-13 MED ORDER — PROMETHAZINE HCL 25 MG/ML IJ SOLN
12.5000 mg | Freq: Once | INTRAMUSCULAR | Status: AC
  Administered 2017-06-13: 12.5 mg via INTRAVENOUS
  Filled 2017-06-13: qty 1

## 2017-06-13 MED ORDER — NYSTATIN 100000 UNIT/ML MT SUSP
5.0000 mL | Freq: Once | OROMUCOSAL | Status: AC
  Administered 2017-06-13: 500000 [IU] via ORAL
  Filled 2017-06-13: qty 5

## 2017-06-13 MED ORDER — HEPARIN SOD (PORK) LOCK FLUSH 100 UNIT/ML IV SOLN
500.0000 [IU] | Freq: Once | INTRAVENOUS | Status: AC
  Administered 2017-06-13: 500 [IU]
  Filled 2017-06-13: qty 5

## 2017-06-13 MED ORDER — NYSTATIN 100000 UNIT/ML MT SUSP: 500000.0000 [IU] | Freq: Four times a day (QID) | OROMUCOSAL | 0 refills | Status: AC

## 2017-06-13 MED ORDER — SODIUM CHLORIDE 0.9 % IV BOLUS (SEPSIS)
1000.0000 mL | Freq: Once | INTRAVENOUS | Status: AC
  Administered 2017-06-13: 1000 mL via INTRAVENOUS

## 2017-06-13 NOTE — ED Notes (Addendum)
Delay in blood work Pt wants port accessed.

## 2017-06-13 NOTE — Telephone Encounter (Signed)
Oncology Nurse Navigator Documentation  Called [name], directed him to [facility] ED per Dr. Calton Dach guidance.  Gayleen Orem, RN, BSN, [place] [facility] Oncology Nurse [place] at [place] [phone]

## 2017-06-13 NOTE — ED Provider Notes (Signed)
5:07 PM Care assumed from Dr. Ellender Hose.  At time of transfer care, patient is awaiting reassessment after urinalysis. If no evidence of UTI, patient will likely be stable for discharge.  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 Patient will be discharged with prescriptions for Magic mouthwash and nystatin.    Urinalysis shows no evidence of urinary tract infection. Patient was reassessed by me and was able to tolerate fluids without difficulty. Patient reports feeling "much better". Patient reassured about diagnostic testing today.  Patient will follow-up with PCP, otolaryngology, and possibly GI. Patient given prescriptions. Patient discharged in good condition with understanding of return precautions and plan of care.    Clinical Impression: 1. Candida infection of mouth   2. Fall, initial encounter     Disposition: Discharge  Condition: Good  I have discussed the results, Dx and Tx plan with the pt(& family if present). He/she/they expressed understanding and agree(s) with the plan. Discharge instructions discussed at great length. Strict return precautions discussed and pt &/or family have verbalized understanding of the instructions. No further questions at time of discharge.    New Prescriptions   NYSTATIN (MYCOSTATIN) 100000 UNIT/ML SUSPENSION    Take 5 mLs (500,000 Units total) by mouth 4 (four) times daily.    Follow Up: Oncology Follow-up with your Oncologist to discuss your ER visit and to arrange close follow-up, ideally withint he next 2-3 days. You may benefit from seeing a GI doctor if your symptoms do not improve.    [facility] DEPT [place] [phone] [place] [phone]  If symptoms worsen       Tegeler, Gwenyth Allegra, MD 06/14/17 [phone]

## 2017-06-13 NOTE — Telephone Encounter (Signed)
I recommend directing the patient to the ER as discussed

## 2017-06-13 NOTE — ED Triage Notes (Signed)
Pt comes in after a fall this morning.  States he has throat and tongue cancer.  Reports falling and hitting his head on the mattress and his right side on the metal bed frame. No bruising noted at this time over rib cage. Ambulatory into triage.  A&O x4. Feels he may be dehydrated.

## 2017-06-13 NOTE — Telephone Encounter (Signed)
Oncology Nurse Navigator Documentation  Received tearful call from Mr. Tadros.  He just completed PT appt with Serafina Royals. He reported:  Continues to lose weight, down to 131lbs; "keep throwing up".  Fell this morning.  He stated "I'm scared.  Do you think I can be admitted so I can get something to eat?" Dr. Alvy Bimler. [facility] notified.  Gayleen Orem, RN, BSN, [place] [facility] Oncology Nurse [place] at [place] [phone]
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
## 2017-06-13 NOTE — ED Notes (Signed)
Discharge instructions reviewed with patient. Patient verbalizes understanding. VSS.   

## 2017-06-14 ENCOUNTER — Encounter: Payer: Self-pay | Admitting: Physical Therapy

## 2017-06-14 ENCOUNTER — Other Ambulatory Visit: Payer: Self-pay | Admitting: Radiation Oncology

## 2017-06-14 ENCOUNTER — Telehealth: Payer: Self-pay | Admitting: Hematology and Oncology

## 2017-06-14 ENCOUNTER — Ambulatory Visit: Payer: Medicare PPO | Admitting: Physical Therapy

## 2017-06-14 DIAGNOSIS — I890 Lymphedema, not elsewhere classified: Secondary | ICD-10-CM

## 2017-06-14 DIAGNOSIS — C021 Malignant neoplasm of border of tongue: Secondary | ICD-10-CM

## 2017-06-14 NOTE — Telephone Encounter (Signed)
sw pt to confirm ivf starting 7/14 per schmsg
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
## 2017-06-15 ENCOUNTER — Ambulatory Visit (HOSPITAL_BASED_OUTPATIENT_CLINIC_OR_DEPARTMENT_OTHER): Payer: Medicare PPO

## 2017-06-15 VITALS — BP 138/90 | HR 70 | Temp 97.5°F | Resp 18

## 2017-06-15 DIAGNOSIS — C021 Malignant neoplasm of border of tongue: Secondary | ICD-10-CM

## 2017-06-15 DIAGNOSIS — Z95828 Presence of other vascular implants and grafts: Secondary | ICD-10-CM

## 2017-06-15 MED ORDER — SODIUM CHLORIDE 0.9 % IV SOLN
Freq: Once | INTRAVENOUS | Status: AC
  Administered 2017-06-15: 08:00:00 via INTRAVENOUS

## 2017-06-15 MED ORDER — SODIUM CHLORIDE 0.9% FLUSH
10.0000 mL | Freq: Once | INTRAVENOUS | Status: AC
  Administered 2017-06-15: 10 mL
  Filled 2017-06-15: qty 10

## 2017-06-15 MED ORDER — HEPARIN SOD (PORK) LOCK FLUSH 100 UNIT/ML IV SOLN
250.0000 [IU] | Freq: Once | INTRAVENOUS | Status: DC
Start: 2017-06-15 — End: 2017-06-15
  Filled 2017-06-15: qty 5

## 2017-06-15 MED ORDER — HEPARIN SOD (PORK) LOCK FLUSH 100 UNIT/ML IV SOLN
500.0000 [IU] | Freq: Once | INTRAVENOUS | Status: AC | PRN
  Administered 2017-06-15: 500 [IU]
  Filled 2017-06-15: qty 5

## 2017-06-15 NOTE — Patient Instructions (Signed)
Dehydration, Adult Dehydration is when there is not enough fluid or water in your body. This happens when you lose more fluids than you take in. Dehydration can range from mild to very bad. It should be treated right away to keep it from getting very bad. Symptoms of mild dehydration may include:  Thirst.  Dry lips.  Slightly dry mouth.  Dry, warm skin.  Dizziness. Symptoms of moderate dehydration may include:  Very dry mouth.  Muscle cramps.  Dark pee (urine). Pee may be the color of tea.  Your body making less pee.  Your eyes making fewer tears.  Heartbeat that is uneven or faster than normal (palpitations).  Headache.  Light-headedness, especially when you stand up from sitting.  Fainting (syncope). Symptoms of very bad dehydration may include:  Changes in skin, such as: ? Cold and clammy skin. ? Blotchy (mottled) or pale skin. ? Skin that does not quickly return to normal after being lightly pinched and let go (poor skin turgor).  Changes in body fluids, such as: ? Feeling very thirsty. ? Your eyes making fewer tears. ? Not sweating when body temperature is high, such as in hot weather. ? Your body making very little pee.  Changes in vital signs, such as: ? Weak pulse. ? Pulse that is more than 100 beats a minute when you are sitting still. ? Fast breathing. ? Low blood pressure.  Other changes, such as: ? Sunken eyes. ? Cold hands and feet. ? Confusion. ? Lack of energy (lethargy). ? Trouble waking up from sleep. ? Short-term weight loss. ? Unconsciousness. Follow these instructions at home:  If told by your doctor, drink an ORS: ? Make an ORS by using instructions on the package. ? Start by drinking small amounts, about  cup (120 mL) every 5-10 minutes. ? Slowly drink more until you have had the amount that your doctor said to have.  Drink enough clear fluid to keep your pee clear or pale yellow. If you were told to drink an ORS, finish the ORS  first, then start slowly drinking clear fluids. Drink fluids such as: ? Water. Do not drink only water by itself. Doing that can make the salt (sodium) level in your body get too low (hyponatremia). ? Ice chips. ? Fruit juice that you have added water to (diluted). ? Low-calorie sports drinks.  Avoid: ? Alcohol. ? Drinks that have a lot of sugar. These include high-calorie sports drinks, fruit juice that does not have water added, and soda. ? Caffeine. ? Foods that are greasy or have a lot of fat or sugar.  Take over-the-counter and prescription medicines only as told by your doctor.  Do not take salt tablets. Doing that can make the salt level in your body get too high (hypernatremia).  Eat foods that have minerals (electrolytes). Examples include bananas, oranges, potatoes, tomatoes, and spinach.  Keep all follow-up visits as told by your doctor. This is important. Contact a doctor if:  You have belly (abdominal) pain that: ? Gets worse. ? Stays in one area (localizes).  You have a rash.  You have a stiff neck.  You get angry or annoyed more easily than normal (irritability).  You are more sleepy than normal.  You have a harder time waking up than normal.  You feel: ? Weak. ? Dizzy. ? Very thirsty.  You have peed (urinated) only a small amount of very dark pee during 6-8 hours. Get help right away if:  You have symptoms of   very bad dehydration.  You cannot drink fluids without throwing up (vomiting).  Your symptoms get worse with treatment.  You have a fever.  You have a very bad headache.  You are throwing up or having watery poop (diarrhea) and it: ? Gets worse. ? Does not go away.  You have blood or something green (bile) in your throw-up.  You have blood in your poop (stool). This may cause poop to look black and tarry.  You have not peed in 6-8 hours.  You pass out (faint).  Your heart rate when you are sitting still is more than 100 beats a  minute.  You have trouble breathing. This information is not intended to replace advice given to you by your health care provider. Make sure you discuss any questions you have with your health care provider. Document Released: 09/15/2009 Document Revised: 06/08/2016 Document Reviewed: 01/13/2016 Elsevier Interactive Patient Education  2018 [employer]  

## 2017-06-17 ENCOUNTER — Ambulatory Visit (HOSPITAL_BASED_OUTPATIENT_CLINIC_OR_DEPARTMENT_OTHER): Payer: Medicare PPO

## 2017-06-17 ENCOUNTER — Ambulatory Visit: Payer: Medicare PPO | Admitting: Physical Therapy

## 2017-06-17 VITALS — BP 142/77 | HR 69 | Temp 97.6°F | Resp 18 | Ht 68.0 in | Wt 130.1 lb

## 2017-06-17 DIAGNOSIS — Z95828 Presence of other vascular implants and grafts: Secondary | ICD-10-CM

## 2017-06-17 DIAGNOSIS — I890 Lymphedema, not elsewhere classified: Secondary | ICD-10-CM

## 2017-06-17 DIAGNOSIS — C021 Malignant neoplasm of border of tongue: Secondary | ICD-10-CM | POA: Diagnosis not present

## 2017-06-17 DIAGNOSIS — E860 Dehydration: Secondary | ICD-10-CM

## 2017-06-17 MED ORDER — HEPARIN SOD (PORK) LOCK FLUSH 100 UNIT/ML IV SOLN
500.0000 [IU] | Freq: Once | INTRAVENOUS | Status: AC | PRN
  Administered 2017-06-17: 500 [IU]
  Filled 2017-06-17: qty 5

## 2017-06-17 MED ORDER — SODIUM CHLORIDE 0.9% FLUSH
10.0000 mL | INTRAVENOUS | Status: DC | PRN
  Administered 2017-06-17: 10 mL
  Filled 2017-06-17: qty 10

## 2017-06-17 MED ORDER — SODIUM CHLORIDE 0.9 % IV SOLN
Freq: Once | INTRAVENOUS | Status: AC
  Administered 2017-06-17: 10:00:00 via INTRAVENOUS

## 2017-06-17 NOTE — Patient Instructions (Signed)
Dehydration, Adult Dehydration is a condition in which there is not enough fluid or water in the body. This happens when you lose more fluids than you take in. Important organs, such as the kidneys, brain, and heart, cannot function without a proper amount of fluids. Any loss of fluids from the body can lead to dehydration. Dehydration can range from mild to severe. This condition should be treated right away to prevent it from becoming severe. What are the causes? This condition may be caused by:  Vomiting.  Diarrhea.  Excessive sweating, such as from heat exposure or exercise.  Not drinking enough fluid, especially: ? When ill. ? While doing activity that requires a lot of energy.  Excessive urination.  Fever.  Infection.  Certain medicines, such as medicines that cause the body to lose excess fluid (diuretics).  Inability to access safe drinking water.  Reduced physical ability to get adequate water and food.  What increases the risk? This condition is more likely to develop in people:  Who have a poorly controlled long-term (chronic) illness, such as diabetes, heart disease, or kidney disease.  Who are age 65 or older.  Who are disabled.  Who live in a place with high altitude.  Who play endurance sports.  What are the signs or symptoms? Symptoms of mild dehydration may include:  Thirst.  Dry lips.  Slightly dry mouth.  Dry, warm skin.  Dizziness. Symptoms of moderate dehydration may include:  Very dry mouth.  Muscle cramps.  Dark urine. Urine may be the color of tea.  Decreased urine production.  Decreased tear production.  Heartbeat that is irregular or faster than normal (palpitations).  Headache.  Light-headedness, especially when you stand up from a sitting position.  Fainting (syncope). Symptoms of severe dehydration may include:  Changes in skin, such as: ? Cold and clammy skin. ? Blotchy (mottled) or pale skin. ? Skin that does  not quickly return to normal after being lightly pinched and released (poor skin turgor).  Changes in body fluids, such as: ? Extreme thirst. ? No tear production. ? Inability to sweat when body temperature is high, such as in hot weather. ? Very little urine production.  Changes in vital signs, such as: ? Weak pulse. ? Pulse that is more than 100 beats a minute when sitting still. ? Rapid breathing. ? Low blood pressure.  Other changes, such as: ? Sunken eyes. ? Cold hands and feet. ? Confusion. ? Lack of energy (lethargy). ? Difficulty waking up from sleep. ? Short-term weight loss. ? Unconsciousness. How is this diagnosed? This condition is diagnosed based on your symptoms and a physical exam. Blood and urine tests may be done to help confirm the diagnosis. How is this treated? Treatment for this condition depends on the severity. Mild or moderate dehydration can often be treated at home. Treatment should be started right away. Do not wait until dehydration becomes severe. Severe dehydration is an emergency and it needs to be treated in a hospital. Treatment for mild dehydration may include:  Drinking more fluids.  Replacing salts and minerals in your blood (electrolytes) that you may have lost. Treatment for moderate dehydration may include:  Drinking an oral rehydration solution (ORS). This is a drink that helps you replace fluids and electrolytes (rehydrate). It can be found at pharmacies and retail stores. Treatment for severe dehydration may include:  Receiving fluids through an IV tube.  Receiving an electrolyte solution through a feeding tube that is passed through your nose   and into your stomach (nasogastric tube, or NG tube).  Correcting any abnormalities in electrolytes.  Treating the underlying cause of dehydration. Follow these instructions at home:  If directed by your health care provider, drink an ORS: ? Make an ORS by following instructions on the  package. ? Start by drinking small amounts, about  cup (120 mL) every 5-10 minutes. ? Slowly increase how much you drink until you have taken the amount recommended by your health care provider.  Drink enough clear fluid to keep your urine clear or pale yellow. If you were told to drink an ORS, finish the ORS first, then start slowly drinking other clear fluids. Drink fluids such as: ? Water. Do not drink only water. Doing that can lead to having too little salt (sodium) in the body (hyponatremia). ? Ice chips. ? Fruit juice that you have added water to (diluted fruit juice). ? Low-calorie sports drinks.  Avoid: ? Alcohol. ? Drinks that contain a lot of sugar. These include high-calorie sports drinks, fruit juice that is not diluted, and soda. ? Caffeine. ? Foods that are greasy or contain a lot of fat or sugar.  Take over-the-counter and prescription medicines only as told by your health care provider.  Do not take sodium tablets. This can lead to having too much sodium in the body (hypernatremia).  Eat foods that contain a healthy balance of electrolytes, such as bananas, oranges, potatoes, tomatoes, and spinach.  Keep all follow-up visits as told by your health care provider. This is important. Contact a health care provider if:  You have abdominal pain that: ? Gets worse. ? Stays in one area (localizes).  You have a rash.  You have a stiff neck.  You are more irritable than usual.  You are sleepier or more difficult to wake up than usual.  You feel weak or dizzy.  You feel very thirsty.  You have urinated only a small amount of very dark urine over 6-8 hours. Get help right away if:  You have symptoms of severe dehydration.  You cannot drink fluids without vomiting.  Your symptoms get worse with treatment.  You have a fever.  You have a severe headache.  You have vomiting or diarrhea that: ? Gets worse. ? Does not go away.  You have blood or green matter  (bile) in your vomit.  You have blood in your stool. This may cause stool to look black and tarry.  You have not urinated in 6-8 hours.  You faint.  Your heart rate while sitting still is over 100 beats a minute.  You have trouble breathing. This information is not intended to replace advice given to you by your health care provider. Make sure you discuss any questions you have with your health care provider. Document Released: 11/19/2005 Document Revised: 06/15/2016 Document Reviewed: 01/13/2016 Elsevier Interactive Patient Education  2018 [employer]  
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
## 2017-06-17 NOTE — Progress Notes (Signed)
Pt c/o 2 possible broken teeth aggravating his tongue on the lower left side.  Discussed with Dr. Alvy Bimler.  Pt will need referral to Dr. Enrique Sack.. Message left for Hassan Rowan, RN with Dr. Alvy Bimler to facilitate this.

## 2017-06-18 ENCOUNTER — Encounter: Payer: Self-pay | Admitting: Physical Therapy

## 2017-06-18 ENCOUNTER — Telehealth: Payer: Self-pay

## 2017-06-18 NOTE — Telephone Encounter (Signed)
Referral called to Dr. Enrique Sack.

## 2017-06-19 ENCOUNTER — Encounter: Payer: Self-pay | Admitting: Hematology and Oncology

## 2017-06-19 ENCOUNTER — Ambulatory Visit: Payer: Medicare PPO | Admitting: Nutrition

## 2017-06-19 ENCOUNTER — Telehealth: Payer: Self-pay | Admitting: Hematology and Oncology

## 2017-06-19 ENCOUNTER — Encounter (HOSPITAL_COMMUNITY): Payer: Self-pay | Admitting: Dentistry

## 2017-06-19 ENCOUNTER — Ambulatory Visit: Payer: Medicare PPO | Admitting: Physical Therapy

## 2017-06-19 ENCOUNTER — Ambulatory Visit (HOSPITAL_BASED_OUTPATIENT_CLINIC_OR_DEPARTMENT_OTHER): Payer: Medicare PPO

## 2017-06-19 ENCOUNTER — Ambulatory Visit (HOSPITAL_BASED_OUTPATIENT_CLINIC_OR_DEPARTMENT_OTHER): Payer: Medicare PPO | Admitting: Hematology and Oncology

## 2017-06-19 ENCOUNTER — Ambulatory Visit (HOSPITAL_COMMUNITY): Payer: Self-pay | Admitting: Dentistry

## 2017-06-19 VITALS — BP 162/82 | HR 54 | Temp 97.8°F

## 2017-06-19 VITALS — BP 134/88 | HR 73 | Resp 18 | Ht 68.0 in | Wt 133.9 lb

## 2017-06-19 DIAGNOSIS — C021 Malignant neoplasm of border of tongue: Secondary | ICD-10-CM | POA: Diagnosis not present

## 2017-06-19 DIAGNOSIS — K117 Disturbances of salivary secretion: Secondary | ICD-10-CM

## 2017-06-19 DIAGNOSIS — R221 Localized swelling, mass and lump, neck: Secondary | ICD-10-CM

## 2017-06-19 DIAGNOSIS — R1310 Dysphagia, unspecified: Secondary | ICD-10-CM | POA: Diagnosis not present

## 2017-06-19 DIAGNOSIS — K0820 Unspecified atrophy of edentulous alveolar ridge: Secondary | ICD-10-CM

## 2017-06-19 DIAGNOSIS — I890 Lymphedema, not elsewhere classified: Secondary | ICD-10-CM

## 2017-06-19 DIAGNOSIS — Z923 Personal history of irradiation: Secondary | ICD-10-CM | POA: Diagnosis not present

## 2017-06-19 DIAGNOSIS — R29898 Other symptoms and signs involving the musculoskeletal system: Secondary | ICD-10-CM

## 2017-06-19 DIAGNOSIS — K140 Glossitis: Secondary | ICD-10-CM

## 2017-06-19 DIAGNOSIS — L599 Disorder of the skin and subcutaneous tissue related to radiation, unspecified: Secondary | ICD-10-CM

## 2017-06-19 DIAGNOSIS — Z95828 Presence of other vascular implants and grafts: Secondary | ICD-10-CM

## 2017-06-19 DIAGNOSIS — R682 Dry mouth, unspecified: Secondary | ICD-10-CM

## 2017-06-19 DIAGNOSIS — G4700 Insomnia, unspecified: Secondary | ICD-10-CM

## 2017-06-19 DIAGNOSIS — K08109 Complete loss of teeth, unspecified cause, unspecified class: Secondary | ICD-10-CM

## 2017-06-19 MED ORDER — HEPARIN SOD (PORK) LOCK FLUSH 100 UNIT/ML IV SOLN
500.0000 [IU] | Freq: Once | INTRAVENOUS | Status: AC | PRN
  Administered 2017-06-19: 500 [IU]
  Filled 2017-06-19: qty 5

## 2017-06-19 MED ORDER — SODIUM CHLORIDE 0.9% FLUSH
10.0000 mL | INTRAVENOUS | Status: DC | PRN
  Administered 2017-06-19: 10 mL
  Filled 2017-06-19: qty 10

## 2017-06-19 MED ORDER — MORPHINE SULFATE (CONCENTRATE) 20 MG/ML PO SOLN: 20.0000 mg | ORAL | 0 refills | Status: AC | PRN

## 2017-06-19 MED ORDER — MIRTAZAPINE 15 MG PO TBDP
15.0000 mg | ORAL_TABLET | Freq: Every day | ORAL | 11 refills | Status: AC
Start: 2017-06-19 — End: ?

## 2017-06-19 MED ORDER — SODIUM CHLORIDE 0.9 % IV SOLN
Freq: Once | INTRAVENOUS | Status: AC
  Administered 2017-06-19: 11:00:00 via INTRAVENOUS

## 2017-06-19 NOTE — Progress Notes (Signed)
Nutrition follow-up completed with patient in symptom management clinic.  Patient is status post treatment for tongue cancer. Weight decreased and documented as 133.9 pounds July 18, down from 142 pounds June 15. Patient reports he is now using tube feeding 6-7 cans daily. Reports he needs supplies from the [facility] and will place a call to order them. Patient reports he has a sore mouth secondary to chipped teeth which he will have evaluated today.  Nutrition diagnosis: Unintended weight loss continues.  Intervention: Recommended patient continue Nutren 1.5 at goal rate to promote weight maintenance and healing. Once he has seen the dentist, I have encouraged him to try soft foods that will not irritate the lining of his mouth. Provided samples of tube feeding until patient gets his new refill order Teach back method used.  Monitoring, evaluation, goals: Patient will tolerate tube feeding at goal rate along with soft moist high protein, high calorie foods to minimize further weight loss and promote healing.  Next visit: Friday, July 20, during IV fluids.  **Disclaimer: This note was dictated with voice recognition software. Similar sounding words can inadvertently be transcribed and this note may contain transcription errors which may not have been corrected upon publication of note.**
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
## 2017-06-19 NOTE — Progress Notes (Signed)
Ok to infuse saline @ 999/hr per Dr. Alvy Bimler for today d/t pt's appt witth Dr. Enrique Sack @ 12:45 pm

## 2017-06-19 NOTE — Telephone Encounter (Signed)
Gave patient wife avs report and appointments for July and August.

## 2017-06-19 NOTE — Assessment & Plan Note (Signed)
He has persistent dysphagia I recommend close follow-up with speech and language therapist for further assessment. He appears to be able to tolerate certain foods well.

## 2017-06-19 NOTE — Patient Instructions (Signed)
Patient is to follow up with Dr. Hendricks Limes at [facility] for evaluation of exposed bone of the lower left lingual mandible. Dr. Enrique Sack  RADIATION THERAPY AND DECISIONS REGARDING YOUR TEETH  Xerostomia (dry mouth) Your salivary glands may be in the filed of radiation.  Radiation may include all or part of your saliva glands.  This will cause your saliva to dry up and you will have a dry mouth.  The dry mouth will be for the rest of your life unless your radiation oncologist tells you otherwise.  Your saliva has many functions:  Saliva wets your tongue for speaking.  It coats your teeth and the inside of your mouth for easier movement.  It helps with chewing and swallowing food.  It helps clean away harmful acid and toxic products made by the germs in your mouth, therefore it helps prevent cavities.  It kills some germs in your mouth and helps to prevent gum disease.  It helps to carry flavor to your taste buds.  Once you have lost your saliva you will be at higher risk for tooth decay and gum disease.  What can be done to help improve your mouth when there's not enough saliva:  1.  Your dentist may give a prescription for Salagen.  It will not bring back all of your saliva but may bring back some of it.  Also your saliva may be thick and ropy or white and foamy. It will not feel like it use to feel.  2.  You will need to swish with water every time your mouth feels dry.  YOU CANNOT suck on any cough drops, mints, lemon drops, candy, vitamin C or any other products.  You cannot use anything other than water to make your mouth feel less dry.  If you want to drink anything else you have to drink it all at once and brush afterwards.  Be sure to discuss the details of your diet habits with your dentist or hygienist.  Radiation caries: This is decay that happens very quickly once your mouth is very dry due to radiation therapy.  Normally cavities take six months to two  years to become a problem.  When you have dry mouth cavities may take as little as eight weeks to cause you a problem.  This is why dental check ups every two months are necessary as long as you have a dry mouth. Radiation caries typically, but not always, start at your gum line where it is hard to see the cavity.  It is therefore also hard to fill these cavities adequately.  This high rate of cavities happens because your mouth no longer has saliva and therefore the acid made by the germs starts the decay process.  Whenever you eat anything the germs in your mouth change the food into acid.  The acid then burns a small hole in your tooth.  This small hole is the beginning of a cavity.  If this is not treated then it will grow bigger and become a cavity.  The way to avoid this hole getting bigger is to use fluoride every evening as prescribed by your dentist.  You have to make sure that your teeth are very clean before you use the fluoride.  This fluoride in turn will strengthen your teeth and prepare them for another day of fighting acid.  If you develop radiation caries many times the damage is so large that you will have to have all your  teeth removed.  This could be a big problem if some of these teeth are in the field of radiation.  Further details of why this could be a big problem will follow.  (See Osteoradionecrosis).  Loss of taste (dysgeusia) This happens to varying degrees once you've had radiation therapy to your jaw region.  Many times taste is not completely lost but becomes limited.  The loss of taste is mostly due to radiation affecting your taste buds.  However if you have no saliva in your mouth to carry the flavor to your taste buds it would be difficult for your taste buds to taste anything.  That is why using water or a prescription for Salagen prior to meals and during meals may help with some of the taste.  Keep in mind that taste generally returns very slowly over the course of several  months or several years after radiation therapy.  Don't give up hope.  Trismus According to your Radiation Oncologist your TMJ or jaw joints are going to be partially or fully in the field of radiation.  This means that over time the muscles that help you open and close your mouth may get stiff.  This will potentially result in your not being able to open your mouth wide enough or as wide as you can open it now.  Le me give you an example of how slowly this happens and how unaware people are of it.  A gentlemen that had radiation therapy two years ago came back to me complaining that bananas are just too large for him to be able to fit them in between his teeth.  He was not able to open wide enough to bite into a banana.  This happens slowly and over a period of time.  What do we do to try and prevent this?  Your dentist will probably give you a stack of sticks called a trismus exercise device .  This stack will help your remind your muscles and your jaw joint to open up to the same distance every day.  Use these sticks every morning when you wake up according to the instructions given by the dentist.   You must use these sticks for at least one to two years after radiation therapy.  The reason for that is because it happens so slowly and keeps going on for about two years after radiation therapy.  Your hospital dentist will help you monitor your mouth opening and make sure that it's not getting smaller.  Osteoradionecrosis (ORN) This is a condition where your jaw bone after having had radiation therapy becomes very dry.  It has very little blood supply to keep it alive.  If you develop a cavity that turns into an abscess or an infection then the jaw bone does not have enough blood supply to help fight the infection.  At this point it is very likely that the infection could cause the death of your jaw bone.  When you have dead bone it has to be removed.  Therefore you might end up having to have surgery to  remove part of your jaw bone, the part of the jaw bone that has been affected.   Healing is also a problem if you are to have surgery in the areas where the bone has had radiation therapy.  The same reasons apply.  If you have surgery you need more blood supply which is not available.  When blood supply and oxygen are not available again, there  is a chance for the bone to die.  Occasionally ORN happens on its own with no obvious reason.  This is quite rare.  We believe that patients who continue to smoke and/or drink alcohol have a higher chance of having this bone problem.  Therefore once your jaw bone has had radiation therapy if there are any teeth in that area, you should never have them pulled.  You should also never have any surgery on your teeth or gums in that area unless the oral surgeon or Periodontist is aware of your history of radiation. There is some expensive management techniques that might be used to limit your risks.  The risks for ORN either from infection or spontaneous ( or on it's own) are life long.    TRISMUS  Trismus is a condition where the jaw does not allow the mouth to open as wide as it usually does.  This can happen almost suddenly, or in other cases the process is so slow, it is hard to notice it-until it is too far along.  When the jaw joints and/or muscles have been exposed to radiation treatments, the onset of Trismus is very slow.  This is because the muscles are losing their stretching ability over a long period of time, as long as 2 YEARS after the end of radiation.  It is therefore important to exercise these muscles and joints.  TRISMUS EXERCISES   Stack of tongue depressors measuring the same or a little less than the last documented MIO (Maximum Interincisal Opening).  Secure them with a rubber band on both ends.  Place the stack in the patient's mouth, supporting the other end.  Allow 30 seconds for muscle stretching.  Rest for a few seconds.  Repeat  3-5 times  For all radiation patients, this exercise is recommended in the mornings and evenings unless otherwise instructed.  The exercise should be done for a period of 2 YEARS after the end of radiation.  MIO should be checked routinely on recall dental visits by the general dentist or the hospital dentist.  The patient is advised to report any changes, soreness, or difficulties encountered when doing the exercises.

## 2017-06-19 NOTE — Assessment & Plan Note (Signed)
He has intermittent neck swelling He is on low-dose steroid therapy with improvement.

## 2017-06-19 NOTE — Assessment & Plan Note (Signed)
He is still not fully healed from recent side effects of treatment although improving overall He has significant dental issue He will be evaluated by dentist for further management I have rescheduled for the IV fluid hydration therapy and plan to see him back in 2 weeks

## 2017-06-19 NOTE — Progress Notes (Signed)
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 This caused him significant discomfort, resulting in decreased food intake.      12/28/2016 Imaging    CT of the neck on 12/28/16 had demonstrate no frank adenopathy in the neck and the tongue was not well visualized die to dental work. On the same day a CT of his chest showed emphysema of the lungs and a few nonspecific pulmonary nodules which were unchanged compared to 06/2015.      01/15/2017 PET scan    This revealed hypermetabolic activity in the left anterior tongue as well as within a small left level 2 node and small nodules in the left parotid gland. No evidence of metastatic disease distantly      01/28/2017 Pathology Results    A.TONGUE, LEFT PARTIAL GLOSSECTOMY: Invasive moderate to poorly-differentiated squamous cell carcinoma, conventional type, 2.5 cm in greatest dimension on gross examination. Invasive squamous cell carcinoma shows greatest depth of invasion of 1.5 cm. Margins uninvolved by invasive tumor (invasive squamous cell carcinoma comes to within 0.6 cm of the inked posterior margin). Negative for lymphovascular invasion Negative for perineural invasion. AJCC Pathologic Stage: pT3 pN3 pM- not applicable. See Cancer Case Summary.  SURGICAL PATHOLOGY CANCER CASE SUMMARY- LIP AND ORAL CAVITY Protocol posting date: June 2017 Procedure: Partial glossectomy Tumor site: Oral, tongue Tumor laterality: left Tumor focality: unifocal Tumor size: Greatest dimension 2.5 cm on gross examination Histologic type: Squamous cell carcinoma, conventional Histologic grade: G2-G3, moderate to poorly differentiated Specimen margins:  Uninvolved by invasive tumor Distance from closest margin: 6 mm (0.6 cm) from posterior margin Lymphovascular invasion: Not identified Perineural invasion: Not identified Regional lymph nodes: Number of lymph nodes involved: 3 Number of lymph nodes examined: 56 Laterality of lymph nodes involved: Bilateral Size of largest metastatic deposit: 9 mm (0.9 cm) Extranodal extension: very focally present in 1 lymph node (part J) Pathologic Stage Classification (pTNM, AJCC 8th Edition) Primary tumor (pT): pT3 Regional lymph nodes (pN): pN3 Distant metastasis (pM): not applicable   B.ANTERIOR DORSAL TONGUE, BIOPSY: Negative for malignancy.  C.POSTERIOR DORSAL TONGUE, BIOPSY: Negative for malignancy.  D.MID DORSAL TONGUE, BIOPSY: Negative for malignancy.  E.DEEP TONGUE, BIOPSY: Negative for malignancy.  F.ANTERIOR FLOOR OF MOUTH, BIOPSY: Negative for malignancy.  G.POSTERIOR FLOOR OF MOUTH, BIOPSY: Negative for malignancy.  H.LEVEL IA, RESECTION: Six benign lymph nodes negative for metastatic carcinoma on routine H&E staining (0/6).  I.LEVEL IB, RESECTION: Two benign lymph nodes negative for metastatic carcinoma on routine H&E staining (0/2). Benign salivary gland tissue.  J.LEFT LEVEL IIA , 3 AND 4, RESECTION: One of twenty-one lymph nodes positive for metastatic carcinoma (0.9 cm focus) with very focal extranodal extension on routine H&E staining (1/21).  K.LEVEL IIB, RESECTION: Four benign lymph nodes negative for metastatic carcinoma on routine H&E staining (0/4). One small focus of benign salivary gland tissue.  L.RIGHT LEVEL IB, RESECTION: Three benign lymph nodes negative for  metastatic carcinoma on routine H&E staining (0/3). Benign salivary gland tissue.  M.RIGHT LEVEL 2A, 3, 4, RESECTION: Two of fifteen lymph nodes positive for metastatic carcinoma (2/15). See comment.  Comment: A small focus suspicious for metastatic carcinoma is seen on the initial H&E stained slide. Therefore, a pankeratin immunohistochemical stain  is performed. The positive control stain functioned appropriately. The pankeratin immunostain highlights a few minute foci of metastatic carcinoma in 2 of the 3 lymph nodes (largest focus 0.2 mm [0.02 cm]).      01/28/2017 Surgery    He had surgery at [facility] <100 SQCM [15100] (SPLIT THICKNESS SKIN GRAFT LEG)           03/01/2017 Procedure    Successful fluoroscopic guided percutaneous gastrostomy tube placement.      03/01/2017 Procedure    Placement of a subcutaneous port device. Catheter tip at the superior cavoatrial junction and ready to be used.      03/12/2017 - 04/25/2017 Radiation Therapy    Received IMRT / 6 MV photons to tongue and bilateral neck / 60 Gy in 30 fractions.      03/13/2017 Procedure    Baseline hearing is near normal      03/15/2017 - 04/12/2017 Chemotherapy    He received cisplatin x 5 weekly doses      04/17/2017 - 04/25/2017 Hospital Admission    He was admitted to the hospital for management of severe mucositis pain, dehydration, dizziness and uncontrolled nausea       INTERVAL HISTORY: Please see below for problem oriented charting. He is seen in the infusion room He was prescribed steroids with help improve energy and appetite He was prescribed steroid due to neck swelling He is noted to have recent thrush and was prescribed nystatin He is able to tolerate soft diet such as rice pudding but continues to have mild dysphagia. He denies nausea, vomiting or constipation He  has appointment to see dentist for further evaluation of dental pain.  He has not needed any pain medicine recently for sore throat  REVIEW OF SYSTEMS:   Constitutional: Denies fevers, chills  Eyes: Denies blurriness of vision Ears, nose, mouth, throat, and face: Denies mucositis or sore throat Respiratory: Denies cough, dyspnea or wheezes Cardiovascular: Denies palpitation, chest discomfort or lower extremity swelling Gastrointestinal:  Denies nausea, heartburn or change in bowel habits Skin: Denies abnormal skin rashes Lymphatics: Denies new lymphadenopathy or easy bruising Neurological:Denies numbness, tingling or new weaknesses Behavioral/Psych: Mood is stable, no new changes  All other systems were reviewed with the patient and are negative.  I have reviewed the past medical history, past surgical history, social history and family history with the patient and they are unchanged from previous note.  ALLERGIES:  is allergic to latex; adhesive [tape]; and codeine.  MEDICATIONS:  Current Outpatient Prescriptions  Medication Sig Dispense Refill  . aspirin 81 MG chewable tablet Chew 81 mg by mouth every morning.     Marland Kitchen dexamethasone (DECADRON) 4 MG tablet Take 2 tablets now. Then, 1 tab PO TID for 3 days, 1 tab BID for 3 days, 1 tab daily for 3 days, then 1/2 tab daily for 3 days. 22 tablet 0  . lidocaine-prilocaine (EMLA) cream Apply to affected area once 30 g 3  . magnesium oxide (MAG-OX) 400 (241.3 Mg) MG tablet Take 1 tablet (400 mg total) by mouth 2 (two) times daily. 60 tablet 9  . mirtazapine (REMERON SOL-TAB) 15 MG disintegrating tablet Take 1 tablet (15 mg total) by mouth at bedtime. 30 tablet 11  . morphine (ROXANOL) 20 MG/ML concentrated solution Take 1 mL (20 mg total) by mouth every 2 (two) hours as needed for severe pain. 240 mL 0  . nystatin (MYCOSTATIN) 100000  UNIT/ML suspension Take 5 mLs (500,000 Units total) by mouth 4 (four) times daily. 200 mL 0  . ondansetron  (ZOFRAN) 8 MG tablet Take 1 tablet (8 mg total) by mouth every 8 (eight) hours as needed. Start on the third day after chemotherapy. (Patient not taking: Reported on 03/21/2017) 30 tablet 1  . prochlorperazine (COMPAZINE) 10 MG tablet Take 1 tablet (10 mg total) by mouth every 6 (six) hours as needed (Nausea or vomiting). 30 tablet 1   No current facility-administered medications for this visit.     PHYSICAL EXAMINATION: ECOG PERFORMANCE STATUS: 1 - Symptomatic but completely ambulatory GENERAL:alert, no distress and comfortable SKIN: skin color, texture, turgor are normal, no rashes or significant lesions EYES: normal, Conjunctiva are pink and non-injected, sclera clear OROPHARYNX:no exudate, no erythema and lips, buccal mucosa, and tongue normal.  He does not have oral thrush NECK: supple, thyroid normal size, non-tender, without nodularity LYMPH:  no palpable lymphadenopathy in the cervical, axillary or inguinal LUNGS: clear to auscultation and percussion with normal breathing effort HEART: regular rate & rhythm and no murmurs and no lower extremity edema ABDOMEN:abdomen soft, non-tender and normal bowel sounds Musculoskeletal:no cyanosis of digits and no clubbing  NEURO: alert & oriented x 3 with fluent speech, no focal motor/sensory deficits  LABORATORY DATA:  I have reviewed the data as listed    Component Value Date/Time   NA 133 (L) 06/13/2017 1535   NA 136 05/24/2017 0938   K 4.4 06/13/2017 1535   K 3.6 05/24/2017 0938   CL 96 (L) 06/13/2017 1535   CO2 27 06/13/2017 1535   CO2 32 (H) 05/24/2017 0938   GLUCOSE 101 (H) 06/13/2017 1535   GLUCOSE 118 05/24/2017 0938   BUN 29 (H) 06/13/2017 1535   BUN 18.5 05/24/2017 0938   CREATININE 0.83 06/13/2017 1535   CREATININE 0.8 05/24/2017 0938   CALCIUM 9.2 06/13/2017 1535   CALCIUM 9.4 05/24/2017 0938   PROT 7.4 06/13/2017 1535   PROT 6.8 05/24/2017 0938   ALBUMIN 3.6 06/13/2017 1535   ALBUMIN 3.3 (L) 05/24/2017 0938   AST 22  06/13/2017 1535   AST 27 05/24/2017 0938   ALT 19 06/13/2017 1535   ALT 30 05/24/2017 0938   ALKPHOS 51 06/13/2017 1535   ALKPHOS 56 05/24/2017 0938   BILITOT 0.7 06/13/2017 1535   BILITOT 0.67 05/24/2017 0938   GFRNONAA >60 06/13/2017 1535   GFRAA >60 06/13/2017 1535    No results found for: SPEP, UPEP  Lab Results  Component Value Date   WBC 6.5 06/13/2017   NEUTROABS 5.6 06/13/2017   HGB 12.0 (L) 06/13/2017   HCT 35.0 (L) 06/13/2017   MCV 95.6 06/13/2017   PLT 274 06/13/2017      Chemistry      Component Value Date/Time   NA 133 (L) 06/13/2017 1535   NA 136 05/24/2017 0938   K 4.4 06/13/2017 1535   K 3.6 05/24/2017 0938   CL 96 (L) 06/13/2017 1535   CO2 27 06/13/2017 1535   CO2 32 (H) 05/24/2017 0938   BUN 29 (H) 06/13/2017 1535   BUN 18.5 05/24/2017 0938   CREATININE 0.83 06/13/2017 1535   CREATININE 0.8 05/24/2017 0938      Component Value Date/Time   CALCIUM 9.2 06/13/2017 1535   CALCIUM 9.4 05/24/2017 0938   ALKPHOS 51 06/13/2017 1535   ALKPHOS 56 05/24/2017 0938   AST 22 06/13/2017 1535   AST 27 05/24/2017 0938   ALT 19 06/13/2017 1535  ALT 30 05/24/2017 0938   BILITOT 0.7 06/13/2017 1535   BILITOT 0.67 05/24/2017 4270       RADIOGRAPHIC STUDIES: I have personally reviewed the radiological images as listed and agreed with the findings in the report. Dg Chest 2 View  Result Date: 06/13/2017 CLINICAL DATA:  Fall this morning. Hit head on mattress and bed frame. History of head and neck cancer. EXAM: CHEST  2 VIEW COMPARISON:  CT chest December 28, 2016 FINDINGS: Cardiomediastinal silhouette is normal. No pleural effusions or focal consolidations. Trachea projects midline and there is no pneumothorax. Single lumen RIGHT chest Port-A-Cath catheterization mild hyperinflation. Numerous surgical clips in the neck. Surgical clips in the included right abdomen compatible with cholecystectomy. Anterior abdominal wall herniorrhaphy. Mild degenerative change of  the thoracic spine. IMPRESSION: Hyperinflation, no acute cardiopulmonary process. Electronically Signed   By: Elon Alas M.D.   On: 06/13/2017 15:38   Ct Head Wo Contrast  Result Date: 06/13/2017 CLINICAL DATA:  Head injury after fall. EXAM: CT HEAD WITHOUT CONTRAST TECHNIQUE: Contiguous axial images were obtained from the base of the skull through the vertex without intravenous contrast. COMPARISON:  CT scan of February 11, 2013. FINDINGS: Brain: Mild diffuse cortical atrophy is noted. Mild chronic ischemic white matter disease is noted. No mass effect or midline shift is noted. Ventricular size is within normal limits. There is no evidence of mass lesion, hemorrhage or acute infarction. Vascular: No hyperdense vessel or unexpected calcification. Skull: Normal. Negative for fracture or focal lesion. Sinuses/Orbits: Mild mucosal thickening of left maxillary sinus is noted. Other: None. IMPRESSION: Mild diffuse cortical atrophy. Mild chronic ischemic white matter disease. No acute intracranial abnormality seen. Electronically Signed   By: Marijo Conception, M.D.   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 He appears to be able to tolerate certain foods well.  Neck swelling He has intermittent neck swelling He is on low-dose steroid therapy with improvement.   No orders of the defined types were placed in this encounter.  All questions were answered. The patient knows to call the clinic with any problems, questions or concerns. No barriers to learning  was detected. I spent 15 minutes counseling the patient face to face. The total time spent in the appointment was 20 minutes and more than 50% was on counseling and review of test results     Heath Lark, MD 06/19/2017 6:26 PM

## 2017-06-19 NOTE — Patient Instructions (Signed)
Dehydration, Adult Dehydration is a condition in which there is not enough fluid or water in the body. This happens when you lose more fluids than you take in. Important organs, such as the kidneys, brain, and heart, cannot function without a proper amount of fluids. Any loss of fluids from the body can lead to dehydration. Dehydration can range from mild to severe. This condition should be treated right away to prevent it from becoming severe. What are the causes? This condition may be caused by:  Vomiting.  Diarrhea.  Excessive sweating, such as from heat exposure or exercise.  Not drinking enough fluid, especially: ? When ill. ? While doing activity that requires a lot of energy.  Excessive urination.  Fever.  Infection.  Certain medicines, such as medicines that cause the body to lose excess fluid (diuretics).  Inability to access safe drinking water.  Reduced physical ability to get adequate water and food.  What increases the risk? This condition is more likely to develop in people:  Who have a poorly controlled long-term (chronic) illness, such as diabetes, heart disease, or kidney disease.  Who are age 65 or older.  Who are disabled.  Who live in a place with high altitude.  Who play endurance sports.  What are the signs or symptoms? Symptoms of mild dehydration may include:  Thirst.  Dry lips.  Slightly dry mouth.  Dry, warm skin.  Dizziness. Symptoms of moderate dehydration may include:  Very dry mouth.  Muscle cramps.  Dark urine. Urine may be the color of tea.  Decreased urine production.  Decreased tear production.  Heartbeat that is irregular or faster than normal (palpitations).  Headache.  Light-headedness, especially when you stand up from a sitting position.  Fainting (syncope). Symptoms of severe dehydration may include:  Changes in skin, such as: ? Cold and clammy skin. ? Blotchy (mottled) or pale skin. ? Skin that does  not quickly return to normal after being lightly pinched and released (poor skin turgor).  Changes in body fluids, such as: ? Extreme thirst. ? No tear production. ? Inability to sweat when body temperature is high, such as in hot weather. ? Very little urine production.  Changes in vital signs, such as: ? Weak pulse. ? Pulse that is more than 100 beats a minute when sitting still. ? Rapid breathing. ? Low blood pressure.  Other changes, such as: ? Sunken eyes. ? Cold hands and feet. ? Confusion. ? Lack of energy (lethargy). ? Difficulty waking up from sleep. ? Short-term weight loss. ? Unconsciousness. How is this diagnosed? This condition is diagnosed based on your symptoms and a physical exam. Blood and urine tests may be done to help confirm the diagnosis. How is this treated? Treatment for this condition depends on the severity. Mild or moderate dehydration can often be treated at home. Treatment should be started right away. Do not wait until dehydration becomes severe. Severe dehydration is an emergency and it needs to be treated in a hospital. Treatment for mild dehydration may include:  Drinking more fluids.  Replacing salts and minerals in your blood (electrolytes) that you may have lost. Treatment for moderate dehydration may include:  Drinking an oral rehydration solution (ORS). This is a drink that helps you replace fluids and electrolytes (rehydrate). It can be found at pharmacies and retail stores. Treatment for severe dehydration may include:  Receiving fluids through an IV tube.  Receiving an electrolyte solution through a feeding tube that is passed through your nose   and into your stomach (nasogastric tube, or NG tube).  Correcting any abnormalities in electrolytes.  Treating the underlying cause of dehydration. Follow these instructions at home:  If directed by your health care provider, drink an ORS: ? Make an ORS by following instructions on the  package. ? Start by drinking small amounts, about  cup (120 mL) every 5-10 minutes. ? Slowly increase how much you drink until you have taken the amount recommended by your health care provider.  Drink enough clear fluid to keep your urine clear or pale yellow. If you were told to drink an ORS, finish the ORS first, then start slowly drinking other clear fluids. Drink fluids such as: ? Water. Do not drink only water. Doing that can lead to having too little salt (sodium) in the body (hyponatremia). ? Ice chips. ? Fruit juice that you have added water to (diluted fruit juice). ? Low-calorie sports drinks.  Avoid: ? Alcohol. ? Drinks that contain a lot of sugar. These include high-calorie sports drinks, fruit juice that is not diluted, and soda. ? Caffeine. ? Foods that are greasy or contain a lot of fat or sugar.  Take over-the-counter and prescription medicines only as told by your health care provider.  Do not take sodium tablets. This can lead to having too much sodium in the body (hypernatremia).  Eat foods that contain a healthy balance of electrolytes, such as bananas, oranges, potatoes, tomatoes, and spinach.  Keep all follow-up visits as told by your health care provider. This is important. Contact a health care provider if:  You have abdominal pain that: ? Gets worse. ? Stays in one area (localizes).  You have a rash.  You have a stiff neck.  You are more irritable than usual.  You are sleepier or more difficult to wake up than usual.  You feel weak or dizzy.  You feel very thirsty.  You have urinated only a small amount of very dark urine over 6-8 hours. Get help right away if:  You have symptoms of severe dehydration.  You cannot drink fluids without vomiting.  Your symptoms get worse with treatment.  You have a fever.  You have a severe headache.  You have vomiting or diarrhea that: ? Gets worse. ? Does not go away.  You have blood or green matter  (bile) in your vomit.  You have blood in your stool. This may cause stool to look black and tarry.  You have not urinated in 6-8 hours.  You faint.  Your heart rate while sitting still is over 100 beats a minute.  You have trouble breathing. This information is not intended to replace advice given to you by your health care provider. Make sure you discuss any questions you have with your health care provider. Document Released: 11/19/2005 Document Revised: 06/15/2016 Document Reviewed: 01/13/2016 Elsevier Interactive Patient Education  2018 [employer]  
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
## 2017-06-20 ENCOUNTER — Ambulatory Visit: Payer: Non-veteran care | Admitting: Hematology and Oncology

## 2017-06-21 ENCOUNTER — Telehealth: Payer: Self-pay | Admitting: *Deleted

## 2017-06-21 ENCOUNTER — Ambulatory Visit (HOSPITAL_BASED_OUTPATIENT_CLINIC_OR_DEPARTMENT_OTHER): Payer: Medicare PPO

## 2017-06-21 ENCOUNTER — Ambulatory Visit: Payer: Medicare PPO | Admitting: Physical Therapy

## 2017-06-21 ENCOUNTER — Ambulatory Visit: Payer: Medicare PPO | Admitting: Nutrition

## 2017-06-21 VITALS — BP 145/85 | HR 85 | Temp 98.0°F | Resp 18 | Ht 68.0 in | Wt 135.7 lb

## 2017-06-21 DIAGNOSIS — C021 Malignant neoplasm of border of tongue: Secondary | ICD-10-CM

## 2017-06-21 DIAGNOSIS — Z95828 Presence of other vascular implants and grafts: Secondary | ICD-10-CM

## 2017-06-21 MED ORDER — HEPARIN SOD (PORK) LOCK FLUSH 100 UNIT/ML IV SOLN
500.0000 [IU] | Freq: Once | INTRAVENOUS | Status: AC | PRN
  Administered 2017-06-21: 500 [IU]
  Filled 2017-06-21: qty 5

## 2017-06-21 MED ORDER — SODIUM CHLORIDE 0.9 % IV SOLN
Freq: Once | INTRAVENOUS | Status: AC
  Administered 2017-06-21: 11:00:00 via INTRAVENOUS

## 2017-06-21 MED ORDER — SODIUM CHLORIDE 0.9% FLUSH
10.0000 mL | INTRAVENOUS | Status: DC | PRN
  Administered 2017-06-21: 10 mL
  Filled 2017-06-21: qty 10

## 2017-06-21 NOTE — Telephone Encounter (Signed)
Oncology Nurse Navigator Documentation  In follow-up to patient's 7/16 appt with Dr. Enrique Sack, [facility] Dental Medicine, and per Dr. Tommie Raymond recommendation for follow-up, sent email to [facility] navigator Fransico Setters requesting patient appt with ENT Dr. Hendricks Limes for further evaluation of L mandible bone exposure and associated L lateral tongue ulceration.  Gayleen Orem, RN, BSN, [place] [facility] Oncology Nurse [place] [facility] [place] [phone]

## 2017-06-21 NOTE — Progress Notes (Signed)
I checked with patient today who reports he did contact the [facility] to receive additional tube feeding supplies. Weight improved documented as 135.7 pounds. Patient reports he is tolerating liquids and Tube feeding without difficulty.  Nutrition diagnosis: Unintended weight loss improved.  Intervention: Recommended patient continue Nutren 1.5 at goal rate to promote weight maintenance/weight gain. Patient encouraged to continue oral nutrition as tolerated. Teach back method used.  Monitoring, evaluation, goals: Patient will tolerate oral intake plus tube feeding to promote weight gain./Healing.  Next visit: Wednesday, August 8.  **Disclaimer: This note was dictated with voice recognition software. Similar sounding words can inadvertently be transcribed and this note may contain transcription errors which may not have been corrected upon publication of note.**

## 2017-06-21 NOTE — Progress Notes (Signed)
Had dental appt on 06/19/17.  He has exposed bone left lower jaw area.  He has appt @ [facility] on 06/27/17 with surgeon to address this issue. Pt able to drink fluids well, just cannot eat solid food at this time.  OK to infuse over 1 hour today per MD

## 2017-06-21 NOTE — Patient Instructions (Signed)
Dehydration, Adult Dehydration is a condition in which there is not enough fluid or water in the body. This happens when you lose more fluids than you take in. Important organs, such as the kidneys, brain, and heart, cannot function without a proper amount of fluids. Any loss of fluids from the body can lead to dehydration. Dehydration can range from mild to severe. This condition should be treated right away to prevent it from becoming severe. What are the causes? This condition may be caused by:  Vomiting.  Diarrhea.  Excessive sweating, such as from heat exposure or exercise.  Not drinking enough fluid, especially: ? When ill. ? While doing activity that requires a lot of energy.  Excessive urination.  Fever.  Infection.  Certain medicines, such as medicines that cause the body to lose excess fluid (diuretics).  Inability to access safe drinking water.  Reduced physical ability to get adequate water and food.  What increases the risk? This condition is more likely to develop in people:  Who have a poorly controlled long-term (chronic) illness, such as diabetes, heart disease, or kidney disease.  Who are age 65 or older.  Who are disabled.  Who live in a place with high altitude.  Who play endurance sports.  What are the signs or symptoms? Symptoms of mild dehydration may include:  Thirst.  Dry lips.  Slightly dry mouth.  Dry, warm skin.  Dizziness. Symptoms of moderate dehydration may include:  Very dry mouth.  Muscle cramps.  Dark urine. Urine may be the color of tea.  Decreased urine production.  Decreased tear production.  Heartbeat that is irregular or faster than normal (palpitations).  Headache.  Light-headedness, especially when you stand up from a sitting position.  Fainting (syncope). Symptoms of severe dehydration may include:  Changes in skin, such as: ? Cold and clammy skin. ? Blotchy (mottled) or pale skin. ? Skin that does  not quickly return to normal after being lightly pinched and released (poor skin turgor).  Changes in body fluids, such as: ? Extreme thirst. ? No tear production. ? Inability to sweat when body temperature is high, such as in hot weather. ? Very little urine production.  Changes in vital signs, such as: ? Weak pulse. ? Pulse that is more than 100 beats a minute when sitting still. ? Rapid breathing. ? Low blood pressure.  Other changes, such as: ? Sunken eyes. ? Cold hands and feet. ? Confusion. ? Lack of energy (lethargy). ? Difficulty waking up from sleep. ? Short-term weight loss. ? Unconsciousness. How is this diagnosed? This condition is diagnosed based on your symptoms and a physical exam. Blood and urine tests may be done to help confirm the diagnosis. How is this treated? Treatment for this condition depends on the severity. Mild or moderate dehydration can often be treated at home. Treatment should be started right away. Do not wait until dehydration becomes severe. Severe dehydration is an emergency and it needs to be treated in a hospital. Treatment for mild dehydration may include:  Drinking more fluids.  Replacing salts and minerals in your blood (electrolytes) that you may have lost. Treatment for moderate dehydration may include:  Drinking an oral rehydration solution (ORS). This is a drink that helps you replace fluids and electrolytes (rehydrate). It can be found at pharmacies and retail stores. Treatment for severe dehydration may include:  Receiving fluids through an IV tube.  Receiving an electrolyte solution through a feeding tube that is passed through your nose   and into your stomach (nasogastric tube, or NG tube).  Correcting any abnormalities in electrolytes.  Treating the underlying cause of dehydration. Follow these instructions at home:  If directed by your health care provider, drink an ORS: ? Make an ORS by following instructions on the  package. ? Start by drinking small amounts, about  cup (120 mL) every 5-10 minutes. ? Slowly increase how much you drink until you have taken the amount recommended by your health care provider.  Drink enough clear fluid to keep your urine clear or pale yellow. If you were told to drink an ORS, finish the ORS first, then start slowly drinking other clear fluids. Drink fluids such as: ? Water. Do not drink only water. Doing that can lead to having too little salt (sodium) in the body (hyponatremia). ? Ice chips. ? Fruit juice that you have added water to (diluted fruit juice). ? Low-calorie sports drinks.  Avoid: ? Alcohol. ? Drinks that contain a lot of sugar. These include high-calorie sports drinks, fruit juice that is not diluted, and soda. ? Caffeine. ? Foods that are greasy or contain a lot of fat or sugar.  Take over-the-counter and prescription medicines only as told by your health care provider.  Do not take sodium tablets. This can lead to having too much sodium in the body (hypernatremia).  Eat foods that contain a healthy balance of electrolytes, such as bananas, oranges, potatoes, tomatoes, and spinach.  Keep all follow-up visits as told by your health care provider. This is important. Contact a health care provider if:  You have abdominal pain that: ? Gets worse. ? Stays in one area (localizes).  You have a rash.  You have a stiff neck.  You are more irritable than usual.  You are sleepier or more difficult to wake up than usual.  You feel weak or dizzy.  You feel very thirsty.  You have urinated only a small amount of very dark urine over 6-8 hours. Get help right away if:  You have symptoms of severe dehydration.  You cannot drink fluids without vomiting.  Your symptoms get worse with treatment.  You have a fever.  You have a severe headache.  You have vomiting or diarrhea that: ? Gets worse. ? Does not go away.  You have blood or green matter  (bile) in your vomit.  You have blood in your stool. This may cause stool to look black and tarry.  You have not urinated in 6-8 hours.  You faint.  Your heart rate while sitting still is over 100 beats a minute.  You have trouble breathing. This information is not intended to replace advice given to you by your health care provider. Make sure you discuss any questions you have with your health care provider. Document Released: 11/19/2005 Document Revised: 06/15/2016 Document Reviewed: 01/13/2016 Elsevier Interactive Patient Education  2018 [employer]  

## 2017-06-24 ENCOUNTER — Ambulatory Visit (HOSPITAL_BASED_OUTPATIENT_CLINIC_OR_DEPARTMENT_OTHER): Payer: Medicare PPO

## 2017-06-24 ENCOUNTER — Encounter (HOSPITAL_COMMUNITY): Payer: Self-pay | Admitting: Emergency Medicine

## 2017-06-24 ENCOUNTER — Ambulatory Visit: Payer: Medicare PPO

## 2017-06-24 ENCOUNTER — Ambulatory Visit (HOSPITAL_COMMUNITY)
Admission: RE | Admit: 2017-06-24 | Discharge: 2017-06-24 | Disposition: A | Discharge status: Home / Self Care | Payer: Medicare PPO | Source: Ambulatory Visit | Attending: Hematology and Oncology | Admitting: Hematology and Oncology

## 2017-06-24 ENCOUNTER — Other Ambulatory Visit (HOSPITAL_COMMUNITY)
Admission: RE | Admit: 2017-06-24 | Discharge: 2017-06-24 | Disposition: A | Discharge status: Home / Self Care | Payer: Medicare PPO | Source: Ambulatory Visit | Attending: Hematology and Oncology | Admitting: Hematology and Oncology

## 2017-06-24 ENCOUNTER — Emergency Department (HOSPITAL_COMMUNITY): Payer: Medicare PPO

## 2017-06-24 ENCOUNTER — Emergency Department (HOSPITAL_COMMUNITY)
Admission: EM | Admit: 2017-06-24 | Discharge: 2017-06-24 | Disposition: A | Discharge status: Home / Self Care | Payer: Medicare PPO | Attending: Emergency Medicine | Admitting: Emergency Medicine

## 2017-06-24 VITALS — BP 151/82 | HR 78 | Resp 18 | Ht 68.0 in | Wt 136.8 lb

## 2017-06-24 DIAGNOSIS — Z91040 Latex allergy status: Secondary | ICD-10-CM | POA: Diagnosis not present

## 2017-06-24 DIAGNOSIS — R079 Chest pain, unspecified: Secondary | ICD-10-CM | POA: Diagnosis present

## 2017-06-24 DIAGNOSIS — R0602 Shortness of breath: Secondary | ICD-10-CM | POA: Insufficient documentation

## 2017-06-24 DIAGNOSIS — C021 Malignant neoplasm of border of tongue: Secondary | ICD-10-CM

## 2017-06-24 DIAGNOSIS — R071 Chest pain on breathing: Secondary | ICD-10-CM

## 2017-06-24 DIAGNOSIS — I700 Atherosclerosis of aorta: Secondary | ICD-10-CM

## 2017-06-24 DIAGNOSIS — J9811 Atelectasis: Secondary | ICD-10-CM | POA: Insufficient documentation

## 2017-06-24 DIAGNOSIS — Z7982 Long term (current) use of aspirin: Secondary | ICD-10-CM | POA: Diagnosis not present

## 2017-06-24 DIAGNOSIS — Z79899 Other long term (current) drug therapy: Secondary | ICD-10-CM | POA: Insufficient documentation

## 2017-06-24 DIAGNOSIS — J90 Pleural effusion, not elsewhere classified: Secondary | ICD-10-CM | POA: Insufficient documentation

## 2017-06-24 DIAGNOSIS — R0789 Other chest pain: Secondary | ICD-10-CM

## 2017-06-24 DIAGNOSIS — J449 Chronic obstructive pulmonary disease, unspecified: Secondary | ICD-10-CM | POA: Diagnosis not present

## 2017-06-24 DIAGNOSIS — Z87891 Personal history of nicotine dependence: Secondary | ICD-10-CM | POA: Diagnosis not present

## 2017-06-24 DIAGNOSIS — Z95828 Presence of other vascular implants and grafts: Secondary | ICD-10-CM

## 2017-06-24 LAB — CBC
HCT: 33 % — ABNORMAL LOW (ref 39.0–52.0)
Hemoglobin: 11.2 g/dL — ABNORMAL LOW (ref 13.0–17.0)
MCH: 32.7 pg (ref 26.0–34.0)
MCHC: 33.9 g/dL (ref 30.0–36.0)
MCV: 96.5 fL (ref 78.0–100.0)
PLATELETS: 283 10*3/uL (ref 150–400)
RBC: 3.42 MIL/uL — AB (ref 4.22–5.81)
RDW: 16.7 % — AB (ref 11.5–15.5)
WBC: 10.5 10*3/uL (ref 4.0–10.5)

## 2017-06-24 LAB — COMPREHENSIVE METABOLIC PANEL
ALBUMIN: 3.2 g/dL — AB (ref 3.5–5.0)
ALK PHOS: 57 U/L (ref 38–126)
ALT: 20 U/L (ref 17–63)
ANION GAP: 8 (ref 5–15)
AST: 21 U/L (ref 15–41)
BILIRUBIN TOTAL: 0.9 mg/dL (ref 0.3–1.2)
BUN: 23 mg/dL — ABNORMAL HIGH (ref 6–20)
CALCIUM: 9 mg/dL (ref 8.9–10.3)
CO2: 26 mmol/L (ref 22–32)
Chloride: 97 mmol/L — ABNORMAL LOW (ref 101–111)
Creatinine, Ser: 0.77 mg/dL (ref 0.61–1.24)
GFR calc Af Amer: >60 mL/min (ref >60)
GFR calc non Af Amer: >60 mL/min (ref >60)
GLUCOSE: 100 mg/dL — AB (ref 65–99)
Potassium: 5.4 mmol/L — ABNORMAL HIGH (ref 3.5–5.1)
Sodium: 131 mmol/L — ABNORMAL LOW (ref 135–145)
TOTAL PROTEIN: 6.6 g/dL (ref 6.5–8.1)

## 2017-06-24 LAB — I-STAT TROPONIN, ED: Troponin i, poc: 0 ng/mL (ref 0.00–0.08)

## 2017-06-24 LAB — D-DIMER, QUANTITATIVE (NOT AT ARMC): D DIMER QUANT: 1.36 ug{FEU}/mL — AB (ref 0.00–0.50)

## 2017-06-24 MED ORDER — SODIUM CHLORIDE 0.9 % IV SOLN
Freq: Once | INTRAVENOUS | Status: AC
  Administered 2017-06-24: 11:00:00 via INTRAVENOUS

## 2017-06-24 MED ORDER — FENTANYL CITRATE (PF) 100 MCG/2ML IJ SOLN
100.0000 ug | Freq: Once | INTRAMUSCULAR | Status: AC
  Administered 2017-06-24: 100 ug via INTRAVENOUS
  Filled 2017-06-24: qty 2

## 2017-06-24 MED ORDER — HEPARIN SOD (PORK) LOCK FLUSH 100 UNIT/ML IV SOLN
500.0000 [IU] | Freq: Once | INTRAVENOUS | Status: AC | PRN
  Administered 2017-06-24: 500 [IU]
  Filled 2017-06-24: qty 5

## 2017-06-24 MED ORDER — IOPAMIDOL (ISOVUE-370) INJECTION 76%
100.0000 mL | Freq: Once | INTRAVENOUS | Status: AC | PRN
  Administered 2017-06-24: 100 mL via INTRAVENOUS

## 2017-06-24 MED ORDER — HEPARIN SOD (PORK) LOCK FLUSH 100 UNIT/ML IV SOLN
500.0000 [IU] | Freq: Once | INTRAVENOUS | Status: AC
  Administered 2017-06-24: 500 [IU]
  Filled 2017-06-24: qty 5

## 2017-06-24 MED ORDER — SODIUM CHLORIDE 0.9% FLUSH
10.0000 mL | INTRAVENOUS | Status: DC | PRN
  Administered 2017-06-24: 10 mL
  Filled 2017-06-24: qty 10

## 2017-06-24 MED ORDER — MORPHINE SULFATE (PF) 2 MG/ML IV SOLN: 4.0000 mg | Freq: Once | INTRAVENOUS | Status: DC

## 2017-06-24 MED ORDER — AZITHROMYCIN 500 MG PO TABS: 500.0000 mg | ORAL_TABLET | Freq: Every day | ORAL | 0 refills | Status: DC

## 2017-06-24 MED ORDER — SODIUM CHLORIDE 0.9 % IV BOLUS (SEPSIS)
1000.0000 mL | Freq: Once | INTRAVENOUS | Status: AC
  Administered 2017-06-24: 1000 mL via INTRAVENOUS

## 2017-06-24 MED ORDER — IOPAMIDOL (ISOVUE-370) INJECTION 76%
INTRAVENOUS | Status: AC
  Filled 2017-06-24: qty 100

## 2017-06-24 NOTE — ED Notes (Signed)
Bed: TE70 Expected date:  Expected time:  Means of arrival:  Comments: Hold for cancer center

## 2017-06-24 NOTE — Progress Notes (Signed)
Upon arrival to [facility], pt states he is having new right mid side/ right anterior chest pain. It is worse with breathing and with movement. He states it has been going on all weekend-worse today.  He has had his gallbladder removed years ago.  Denies any problems with voiding. Bowels moving daily. Tube feedings going well.  Pt is visably uncomfortable-difficult for him to get comfortable..  Discussed with Dr. Alvy Bimler. She recommends he go to ED for evaluation. IV fluids completed over 1 hour. Called report to [facility], Camera operator.  She states there are no rooms currently available but will call when there is one. In the meantime she  Asked for CXR and d-Dimer to be done prior to arrival to ED.  was done.  Received call back from [facility] can go to room 12.  Transported pt to ED via w/c. Wife accompanied pt.

## 2017-06-24 NOTE — ED Notes (Signed)
Patient transported to CT 

## 2017-06-24 NOTE — Discharge Instructions (Signed)
Please take all of your antibiotics until finished!   You may develop abdominal discomfort or diarrhea from the antibiotic.  You may help offset this with probiotics which you can buy or get in yogurt. Do not eat  or take the probiotics until 2 hours after your antibiotic.   Please follow up with your PCP for follow up in the next week. If you develop worsening or new concerning symptoms you can return to the emergency department for re-evaluation.   Establish relationship with primary care doctor as discussed. A resource guide and information on the Affordable Care Act has been provided for your information.    RESOURCE GUIDE  If you do not have a primary care doctor to follow up with regarding today's visit, please call the [facility] [place] at [phone] to make an appointment. Hours of operation are 10am - 7pm, Monday through Friday, and they have a sliding scale fee.   Insufficient Money for Medicine: Contact [employer]:  call "211" or [place] [phone].  No Primary Care Doctor: Call [facility]  [phone] - can help you locate a primary care doctor that  accepts your insurance, provides certain services, etc. Physician Referral Service- [phone]  Agencies that provide inexpensive medical care: [facility]  [facility]  [phone] [facility]  [phone] [facility]  [phone] [facility]  [phone] [facility]  [phone]  [place] Providers: [facility]- [address], [address]  [phone], Mon-Fri 9am-7pm, Sat 9am-1pm [place], [address] [place], [address] [place]  [place]- [address]  [place], [address], [phone]  Only accepts [place] patients  after they have their name  applied to their card  Self Pay (no insurance) in [facility]: Sickle Cell Patients: Dr Kevan Ny, [facility]  [place], [facility]- [place]  [facility] [place]- [address] [facility]- see information above (Speak to [facility] if you do not have insurance)       -  [facility]- [place], [place],  [place], [place]  Dr Vista Lawman-  [address], [place], [facility]- [address] [phone]       -  [facility] [place]- [address], [place], also [address] [phone]       -    [facility]- [address], [place], 1st & 3rd Saturday   every month, 10am-1pm  1) Find a Doctor and Pay Out of Pocket Although you won't have to find out who is covered by your insurance plan, it is a good idea to ask around and get recommendations. You will then need to call the office and see if the doctor you have chosen will  accept you as a new patient and what types of options they offer for patients who are self-pay. Some doctors offer discounts or will set up payment plans for their patients who do not have insurance, but you will need to ask so you aren't surprised when you get to your appointment.  2) Contact Your Local Health Department Not all health departments have doctors that can see patients for sick visits, but many do, so it is worth a call to see if yours does. If you don't know where your local health department is, you can check in your phone book. The CDC also has a tool to help you locate your state's health department, and many state websites also have listings of all of their local health departments.  3) Find a  [facility] If your illness is not likely to be very severe or complicated, you may want to try a walk in clinic. These are popping up all over the country in pharmacies, drugstores, and shopping centers. They're usually staffed by nurse practitioners or physician assistants that have been trained to treat common illnesses and complaints. They're usually fairly quick and inexpensive. However, if you have serious medical issues or chronic medical problems, these are probably not your best option

## 2017-06-24 NOTE — ED Triage Notes (Signed)
Per Eustaquio Maize, RN from cancer center, pt reports right chest pain radiating to epigastric area. Also reports shortness of breath. D dimer  Sent to lab. Chest xray performed as well. Alert and oriented x 4./
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
## 2017-06-25 ENCOUNTER — Ambulatory Visit: Payer: Medicare PPO | Admitting: Physical Therapy

## 2017-06-26 ENCOUNTER — Ambulatory Visit: Payer: Medicare PPO | Admitting: Physical Therapy

## 2017-06-26 ENCOUNTER — Ambulatory Visit (HOSPITAL_BASED_OUTPATIENT_CLINIC_OR_DEPARTMENT_OTHER): Payer: Medicare PPO

## 2017-06-26 VITALS — BP 145/100 | HR 83 | Temp 98.2°F | Resp 18 | Ht 68.0 in | Wt 135.1 lb

## 2017-06-26 DIAGNOSIS — C021 Malignant neoplasm of border of tongue: Secondary | ICD-10-CM

## 2017-06-26 DIAGNOSIS — Z95828 Presence of other vascular implants and grafts: Secondary | ICD-10-CM

## 2017-06-26 DIAGNOSIS — E860 Dehydration: Secondary | ICD-10-CM

## 2017-06-26 DIAGNOSIS — I890 Lymphedema, not elsewhere classified: Secondary | ICD-10-CM

## 2017-06-26 DIAGNOSIS — L599 Disorder of the skin and subcutaneous tissue related to radiation, unspecified: Secondary | ICD-10-CM

## 2017-06-26 MED ORDER — HEPARIN SOD (PORK) LOCK FLUSH 100 UNIT/ML IV SOLN
500.0000 [IU] | Freq: Once | INTRAVENOUS | Status: AC | PRN
  Administered 2017-06-26: 500 [IU]
  Filled 2017-06-26: qty 5

## 2017-06-26 MED ORDER — SODIUM CHLORIDE 0.9 % IV SOLN
Freq: Once | INTRAVENOUS | Status: AC
  Administered 2017-06-26: 10:00:00 via INTRAVENOUS

## 2017-06-26 MED ORDER — SODIUM CHLORIDE 0.9% FLUSH
10.0000 mL | INTRAVENOUS | Status: DC | PRN
  Administered 2017-06-26: 10 mL
  Filled 2017-06-26: qty 10

## 2017-06-26 MED FILL — MORPHINE SULF 100 MG/5 ML S: 100 | 20 days supply | Qty: 240 | Fill #0

## 2017-06-26 NOTE — Patient Instructions (Signed)
Dehydration, Adult Dehydration is a condition in which there is not enough fluid or water in the body. This happens when you lose more fluids than you take in. Important organs, such as the kidneys, brain, and heart, cannot function without a proper amount of fluids. Any loss of fluids from the body can lead to dehydration. Dehydration can range from mild to severe. This condition should be treated right away to prevent it from becoming severe. What are the causes? This condition may be caused by:  Vomiting.  Diarrhea.  Excessive sweating, such as from heat exposure or exercise.  Not drinking enough fluid, especially: ? When ill. ? While doing activity that requires a lot of energy.  Excessive urination.  Fever.  Infection.  Certain medicines, such as medicines that cause the body to lose excess fluid (diuretics).  Inability to access safe drinking water.  Reduced physical ability to get adequate water and food.  What increases the risk? This condition is more likely to develop in people:  Who have a poorly controlled long-term (chronic) illness, such as diabetes, heart disease, or kidney disease.  Who are age 65 or older.  Who are disabled.  Who live in a place with high altitude.  Who play endurance sports.  What are the signs or symptoms? Symptoms of mild dehydration may include:  Thirst.  Dry lips.  Slightly dry mouth.  Dry, warm skin.  Dizziness. Symptoms of moderate dehydration may include:  Very dry mouth.  Muscle cramps.  Dark urine. Urine may be the color of tea.  Decreased urine production.  Decreased tear production.  Heartbeat that is irregular or faster than normal (palpitations).  Headache.  Light-headedness, especially when you stand up from a sitting position.  Fainting (syncope). Symptoms of severe dehydration may include:  Changes in skin, such as: ? Cold and clammy skin. ? Blotchy (mottled) or pale skin. ? Skin that does  not quickly return to normal after being lightly pinched and released (poor skin turgor).  Changes in body fluids, such as: ? Extreme thirst. ? No tear production. ? Inability to sweat when body temperature is high, such as in hot weather. ? Very little urine production.  Changes in vital signs, such as: ? Weak pulse. ? Pulse that is more than 100 beats a minute when sitting still. ? Rapid breathing. ? Low blood pressure.  Other changes, such as: ? Sunken eyes. ? Cold hands and feet. ? Confusion. ? Lack of energy (lethargy). ? Difficulty waking up from sleep. ? Short-term weight loss. ? Unconsciousness. How is this diagnosed? This condition is diagnosed based on your symptoms and a physical exam. Blood and urine tests may be done to help confirm the diagnosis. How is this treated? Treatment for this condition depends on the severity. Mild or moderate dehydration can often be treated at home. Treatment should be started right away. Do not wait until dehydration becomes severe. Severe dehydration is an emergency and it needs to be treated in a hospital. Treatment for mild dehydration may include:  Drinking more fluids.  Replacing salts and minerals in your blood (electrolytes) that you may have lost. Treatment for moderate dehydration may include:  Drinking an oral rehydration solution (ORS). This is a drink that helps you replace fluids and electrolytes (rehydrate). It can be found at pharmacies and retail stores. Treatment for severe dehydration may include:  Receiving fluids through an IV tube.  Receiving an electrolyte solution through a feeding tube that is passed through your nose   and into your stomach (nasogastric tube, or NG tube).  Correcting any abnormalities in electrolytes.  Treating the underlying cause of dehydration. Follow these instructions at home:  If directed by your health care provider, drink an ORS: ? Make an ORS by following instructions on the  package. ? Start by drinking small amounts, about  cup (120 mL) every 5-10 minutes. ? Slowly increase how much you drink until you have taken the amount recommended by your health care provider.  Drink enough clear fluid to keep your urine clear or pale yellow. If you were told to drink an ORS, finish the ORS first, then start slowly drinking other clear fluids. Drink fluids such as: ? Water. Do not drink only water. Doing that can lead to having too little salt (sodium) in the body (hyponatremia). ? Ice chips. ? Fruit juice that you have added water to (diluted fruit juice). ? Low-calorie sports drinks.  Avoid: ? Alcohol. ? Drinks that contain a lot of sugar. These include high-calorie sports drinks, fruit juice that is not diluted, and soda. ? Caffeine. ? Foods that are greasy or contain a lot of fat or sugar.  Take over-the-counter and prescription medicines only as told by your health care provider.  Do not take sodium tablets. This can lead to having too much sodium in the body (hypernatremia).  Eat foods that contain a healthy balance of electrolytes, such as bananas, oranges, potatoes, tomatoes, and spinach.  Keep all follow-up visits as told by your health care provider. This is important. Contact a health care provider if:  You have abdominal pain that: ? Gets worse. ? Stays in one area (localizes).  You have a rash.  You have a stiff neck.  You are more irritable than usual.  You are sleepier or more difficult to wake up than usual.  You feel weak or dizzy.  You feel very thirsty.  You have urinated only a small amount of very dark urine over 6-8 hours. Get help right away if:  You have symptoms of severe dehydration.  You cannot drink fluids without vomiting.  Your symptoms get worse with treatment.  You have a fever.  You have a severe headache.  You have vomiting or diarrhea that: ? Gets worse. ? Does not go away.  You have blood or green matter  (bile) in your vomit.  You have blood in your stool. This may cause stool to look black and tarry.  You have not urinated in 6-8 hours.  You faint.  Your heart rate while sitting still is over 100 beats a minute.  You have trouble breathing. This information is not intended to replace advice given to you by your health care provider. Make sure you discuss any questions you have with your health care provider. Document Released: 11/19/2005 Document Revised: 06/15/2016 Document Reviewed: 01/13/2016 Elsevier Interactive Patient Education  2018 [employer]  

## 2017-06-26 NOTE — Progress Notes (Signed)
Ok to infuse IV fluids over 1 hour per MD.  Pt states he is feeling much better after ED visit on Monday. Denies pain on right side of chest. Taking antibiotics as ordered. Denies fever, chills, SOB, nausea, diarrhea etc.
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
## 2017-06-27 ENCOUNTER — Telehealth: Payer: Self-pay | Admitting: *Deleted

## 2017-06-28 ENCOUNTER — Encounter: Payer: Self-pay | Admitting: Nutrition

## 2017-06-28 ENCOUNTER — Ambulatory Visit (HOSPITAL_BASED_OUTPATIENT_CLINIC_OR_DEPARTMENT_OTHER): Payer: Medicare PPO

## 2017-06-28 VITALS — BP 134/87 | HR 78 | Resp 20 | Ht 68.0 in | Wt 137.5 lb

## 2017-06-28 DIAGNOSIS — C021 Malignant neoplasm of border of tongue: Secondary | ICD-10-CM | POA: Diagnosis not present

## 2017-06-28 DIAGNOSIS — E860 Dehydration: Secondary | ICD-10-CM | POA: Diagnosis not present

## 2017-06-28 DIAGNOSIS — Z95828 Presence of other vascular implants and grafts: Secondary | ICD-10-CM

## 2017-06-28 MED ORDER — HEPARIN SOD (PORK) LOCK FLUSH 100 UNIT/ML IV SOLN
500.0000 [IU] | Freq: Once | INTRAVENOUS | Status: AC | PRN
  Administered 2017-06-28: 500 [IU]
  Filled 2017-06-28: qty 5

## 2017-06-28 MED ORDER — SODIUM CHLORIDE 0.9% FLUSH
10.0000 mL | INTRAVENOUS | Status: DC | PRN
  Administered 2017-06-28: 10 mL
  Filled 2017-06-28: qty 10

## 2017-06-28 MED ORDER — SODIUM CHLORIDE 0.9 % IV SOLN
Freq: Once | INTRAVENOUS | Status: AC
  Administered 2017-06-28: 10:00:00 via INTRAVENOUS

## 2017-06-28 NOTE — Telephone Encounter (Signed)
Oncology Nurse Navigator Documentation  Received call from Dr. Hendricks Limes, [facility], who saw patient today per Dr. Ritta Slot recommendation.  Dr. 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  He is going to contact Dr. Jeanette Caprice at [facility] to discuss treatment.  Dr. Hendricks Limes stated he will provide me further information via email when available.  Drs. Bernadene Person and Chical provided update.  Gayleen Orem, RN, BSN, [place] [facility] Nurse [place] [facility] [place] [phone]

## 2017-06-28 NOTE — Patient Instructions (Signed)
Dehydration, Adult Dehydration is a condition in which there is not enough fluid or water in the body. This happens when you lose more fluids than you take in. Important organs, such as the kidneys, brain, and heart, cannot function without a proper amount of fluids. Any loss of fluids from the body can lead to dehydration. Dehydration can range from mild to severe. This condition should be treated right away to prevent it from becoming severe. What are the causes? This condition may be caused by:  Vomiting.  Diarrhea.  Excessive sweating, such as from heat exposure or exercise.  Not drinking enough fluid, especially: ? When ill. ? While doing activity that requires a lot of energy.  Excessive urination.  Fever.  Infection.  Certain medicines, such as medicines that cause the body to lose excess fluid (diuretics).  Inability to access safe drinking water.  Reduced physical ability to get adequate water and food.  What increases the risk? This condition is more likely to develop in people:  Who have a poorly controlled long-term (chronic) illness, such as diabetes, heart disease, or kidney disease.  Who are age 65 or older.  Who are disabled.  Who live in a place with high altitude.  Who play endurance sports.  What are the signs or symptoms? Symptoms of mild dehydration may include:  Thirst.  Dry lips.  Slightly dry mouth.  Dry, warm skin.  Dizziness. Symptoms of moderate dehydration may include:  Very dry mouth.  Muscle cramps.  Dark urine. Urine may be the color of tea.  Decreased urine production.  Decreased tear production.  Heartbeat that is irregular or faster than normal (palpitations).  Headache.  Light-headedness, especially when you stand up from a sitting position.  Fainting (syncope). Symptoms of severe dehydration may include:  Changes in skin, such as: ? Cold and clammy skin. ? Blotchy (mottled) or pale skin. ? Skin that does  not quickly return to normal after being lightly pinched and released (poor skin turgor).  Changes in body fluids, such as: ? Extreme thirst. ? No tear production. ? Inability to sweat when body temperature is high, such as in hot weather. ? Very little urine production.  Changes in vital signs, such as: ? Weak pulse. ? Pulse that is more than 100 beats a minute when sitting still. ? Rapid breathing. ? Low blood pressure.  Other changes, such as: ? Sunken eyes. ? Cold hands and feet. ? Confusion. ? Lack of energy (lethargy). ? Difficulty waking up from sleep. ? Short-term weight loss. ? Unconsciousness. How is this diagnosed? This condition is diagnosed based on your symptoms and a physical exam. Blood and urine tests may be done to help confirm the diagnosis. How is this treated? Treatment for this condition depends on the severity. Mild or moderate dehydration can often be treated at home. Treatment should be started right away. Do not wait until dehydration becomes severe. Severe dehydration is an emergency and it needs to be treated in a hospital. Treatment for mild dehydration may include:  Drinking more fluids.  Replacing salts and minerals in your blood (electrolytes) that you may have lost. Treatment for moderate dehydration may include:  Drinking an oral rehydration solution (ORS). This is a drink that helps you replace fluids and electrolytes (rehydrate). It can be found at pharmacies and retail stores. Treatment for severe dehydration may include:  Receiving fluids through an IV tube.  Receiving an electrolyte solution through a feeding tube that is passed through your nose   and into your stomach (nasogastric tube, or NG tube).  Correcting any abnormalities in electrolytes.  Treating the underlying cause of dehydration. Follow these instructions at home:  If directed by your health care provider, drink an ORS: ? Make an ORS by following instructions on the  package. ? Start by drinking small amounts, about  cup (120 mL) every 5-10 minutes. ? Slowly increase how much you drink until you have taken the amount recommended by your health care provider.  Drink enough clear fluid to keep your urine clear or pale yellow. If you were told to drink an ORS, finish the ORS first, then start slowly drinking other clear fluids. Drink fluids such as: ? Water. Do not drink only water. Doing that can lead to having too little salt (sodium) in the body (hyponatremia). ? Ice chips. ? Fruit juice that you have added water to (diluted fruit juice). ? Low-calorie sports drinks.  Avoid: ? Alcohol. ? Drinks that contain a lot of sugar. These include high-calorie sports drinks, fruit juice that is not diluted, and soda. ? Caffeine. ? Foods that are greasy or contain a lot of fat or sugar.  Take over-the-counter and prescription medicines only as told by your health care provider.  Do not take sodium tablets. This can lead to having too much sodium in the body (hypernatremia).  Eat foods that contain a healthy balance of electrolytes, such as bananas, oranges, potatoes, tomatoes, and spinach.  Keep all follow-up visits as told by your health care provider. This is important. Contact a health care provider if:  You have abdominal pain that: ? Gets worse. ? Stays in one area (localizes).  You have a rash.  You have a stiff neck.  You are more irritable than usual.  You are sleepier or more difficult to wake up than usual.  You feel weak or dizzy.  You feel very thirsty.  You have urinated only a small amount of very dark urine over 6-8 hours. Get help right away if:  You have symptoms of severe dehydration.  You cannot drink fluids without vomiting.  Your symptoms get worse with treatment.  You have a fever.  You have a severe headache.  You have vomiting or diarrhea that: ? Gets worse. ? Does not go away.  You have blood or green matter  (bile) in your vomit.  You have blood in your stool. This may cause stool to look black and tarry.  You have not urinated in 6-8 hours.  You faint.  Your heart rate while sitting still is over 100 beats a minute.  You have trouble breathing. This information is not intended to replace advice given to you by your health care provider. Make sure you discuss any questions you have with your health care provider. Document Released: 11/19/2005 Document Revised: 06/15/2016 Document Reviewed: 01/13/2016 Elsevier Interactive Patient Education  2018 [employer]  

## 2017-07-01 ENCOUNTER — Ambulatory Visit (HOSPITAL_BASED_OUTPATIENT_CLINIC_OR_DEPARTMENT_OTHER): Payer: Medicare PPO

## 2017-07-01 VITALS — BP 139/85 | HR 108 | Resp 22 | Ht 68.0 in | Wt 136.7 lb

## 2017-07-01 DIAGNOSIS — Z95828 Presence of other vascular implants and grafts: Secondary | ICD-10-CM

## 2017-07-01 DIAGNOSIS — E860 Dehydration: Secondary | ICD-10-CM

## 2017-07-01 DIAGNOSIS — C021 Malignant neoplasm of border of tongue: Secondary | ICD-10-CM | POA: Diagnosis not present

## 2017-07-01 MED ORDER — HEPARIN SOD (PORK) LOCK FLUSH 100 UNIT/ML IV SOLN
500.0000 [IU] | Freq: Once | INTRAVENOUS | Status: AC | PRN
  Administered 2017-07-01: 500 [IU]
  Filled 2017-07-01: qty 5

## 2017-07-01 MED ORDER — SODIUM CHLORIDE 0.9% FLUSH
10.0000 mL | INTRAVENOUS | Status: DC | PRN
Start: 2017-07-01 — End: 2017-07-01
  Administered 2017-07-01: 10 mL
  Filled 2017-07-01: qty 10

## 2017-07-01 MED ORDER — SODIUM CHLORIDE 0.9 % IV SOLN
Freq: Once | INTRAVENOUS | Status: AC
  Administered 2017-07-01: 10:00:00 via INTRAVENOUS

## 2017-07-01 NOTE — Patient Instructions (Signed)
Dehydration, Adult Dehydration is a condition in which there is not enough fluid or water in the body. This happens when you lose more fluids than you take in. Important organs, such as the kidneys, brain, and heart, cannot function without a proper amount of fluids. Any loss of fluids from the body can lead to dehydration. Dehydration can range from mild to severe. This condition should be treated right away to prevent it from becoming severe. What are the causes? This condition may be caused by:  Vomiting.  Diarrhea.  Excessive sweating, such as from heat exposure or exercise.  Not drinking enough fluid, especially: ? When ill. ? While doing activity that requires a lot of energy.  Excessive urination.  Fever.  Infection.  Certain medicines, such as medicines that cause the body to lose excess fluid (diuretics).  Inability to access safe drinking water.  Reduced physical ability to get adequate water and food.  What increases the risk? This condition is more likely to develop in people:  Who have a poorly controlled long-term (chronic) illness, such as diabetes, heart disease, or kidney disease.  Who are age 65 or older.  Who are disabled.  Who live in a place with high altitude.  Who play endurance sports.  What are the signs or symptoms? Symptoms of mild dehydration may include:  Thirst.  Dry lips.  Slightly dry mouth.  Dry, warm skin.  Dizziness. Symptoms of moderate dehydration may include:  Very dry mouth.  Muscle cramps.  Dark urine. Urine may be the color of tea.  Decreased urine production.  Decreased tear production.  Heartbeat that is irregular or faster than normal (palpitations).  Headache.  Light-headedness, especially when you stand up from a sitting position.  Fainting (syncope). Symptoms of severe dehydration may include:  Changes in skin, such as: ? Cold and clammy skin. ? Blotchy (mottled) or pale skin. ? Skin that does  not quickly return to normal after being lightly pinched and released (poor skin turgor).  Changes in body fluids, such as: ? Extreme thirst. ? No tear production. ? Inability to sweat when body temperature is high, such as in hot weather. ? Very little urine production.  Changes in vital signs, such as: ? Weak pulse. ? Pulse that is more than 100 beats a minute when sitting still. ? Rapid breathing. ? Low blood pressure.  Other changes, such as: ? Sunken eyes. ? Cold hands and feet. ? Confusion. ? Lack of energy (lethargy). ? Difficulty waking up from sleep. ? Short-term weight loss. ? Unconsciousness. How is this diagnosed? This condition is diagnosed based on your symptoms and a physical exam. Blood and urine tests may be done to help confirm the diagnosis. How is this treated? Treatment for this condition depends on the severity. Mild or moderate dehydration can often be treated at home. Treatment should be started right away. Do not wait until dehydration becomes severe. Severe dehydration is an emergency and it needs to be treated in a hospital. Treatment for mild dehydration may include:  Drinking more fluids.  Replacing salts and minerals in your blood (electrolytes) that you may have lost. Treatment for moderate dehydration may include:  Drinking an oral rehydration solution (ORS). This is a drink that helps you replace fluids and electrolytes (rehydrate). It can be found at pharmacies and retail stores. Treatment for severe dehydration may include:  Receiving fluids through an IV tube.  Receiving an electrolyte solution through a feeding tube that is passed through your nose   and into your stomach (nasogastric tube, or NG tube).  Correcting any abnormalities in electrolytes.  Treating the underlying cause of dehydration. Follow these instructions at home:  If directed by your health care provider, drink an ORS: ? Make an ORS by following instructions on the  package. ? Start by drinking small amounts, about  cup (120 mL) every 5-10 minutes. ? Slowly increase how much you drink until you have taken the amount recommended by your health care provider.  Drink enough clear fluid to keep your urine clear or pale yellow. If you were told to drink an ORS, finish the ORS first, then start slowly drinking other clear fluids. Drink fluids such as: ? Water. Do not drink only water. Doing that can lead to having too little salt (sodium) in the body (hyponatremia). ? Ice chips. ? Fruit juice that you have added water to (diluted fruit juice). ? Low-calorie sports drinks.  Avoid: ? Alcohol. ? Drinks that contain a lot of sugar. These include high-calorie sports drinks, fruit juice that is not diluted, and soda. ? Caffeine. ? Foods that are greasy or contain a lot of fat or sugar.  Take over-the-counter and prescription medicines only as told by your health care provider.  Do not take sodium tablets. This can lead to having too much sodium in the body (hypernatremia).  Eat foods that contain a healthy balance of electrolytes, such as bananas, oranges, potatoes, tomatoes, and spinach.  Keep all follow-up visits as told by your health care provider. This is important. Contact a health care provider if:  You have abdominal pain that: ? Gets worse. ? Stays in one area (localizes).  You have a rash.  You have a stiff neck.  You are more irritable than usual.  You are sleepier or more difficult to wake up than usual.  You feel weak or dizzy.  You feel very thirsty.  You have urinated only a small amount of very dark urine over 6-8 hours. Get help right away if:  You have symptoms of severe dehydration.  You cannot drink fluids without vomiting.  Your symptoms get worse with treatment.  You have a fever.  You have a severe headache.  You have vomiting or diarrhea that: ? Gets worse. ? Does not go away.  You have blood or green matter  (bile) in your vomit.  You have blood in your stool. This may cause stool to look black and tarry.  You have not urinated in 6-8 hours.  You faint.  Your heart rate while sitting still is over 100 beats a minute.  You have trouble breathing. This information is not intended to replace advice given to you by your health care provider. Make sure you discuss any questions you have with your health care provider. Document Released: 11/19/2005 Document Revised: 06/15/2016 Document Reviewed: 01/13/2016 Elsevier Interactive Patient Education  2018 [employer]  

## 2017-07-01 NOTE — Progress Notes (Signed)
Pt prefers IV fluids over 1 hour. Tolerates well

## 2017-07-02 ENCOUNTER — Ambulatory Visit: Payer: Medicare PPO | Admitting: Physical Therapy

## 2017-07-03 ENCOUNTER — Ambulatory Visit (HOSPITAL_BASED_OUTPATIENT_CLINIC_OR_DEPARTMENT_OTHER): Payer: Medicare PPO

## 2017-07-03 VITALS — BP 129/91 | Temp 97.8°F | Resp 19 | Ht 68.0 in | Wt 135.3 lb

## 2017-07-03 DIAGNOSIS — C021 Malignant neoplasm of border of tongue: Secondary | ICD-10-CM | POA: Diagnosis not present

## 2017-07-03 DIAGNOSIS — Z95828 Presence of other vascular implants and grafts: Secondary | ICD-10-CM

## 2017-07-03 MED ORDER — SODIUM CHLORIDE 0.9% FLUSH
10.0000 mL | INTRAVENOUS | Status: DC | PRN
  Administered 2017-07-03: 10 mL
  Filled 2017-07-03: qty 10

## 2017-07-03 MED ORDER — HEPARIN SOD (PORK) LOCK FLUSH 100 UNIT/ML IV SOLN
500.0000 [IU] | Freq: Once | INTRAVENOUS | Status: AC | PRN
  Administered 2017-07-03: 500 [IU]
  Filled 2017-07-03: qty 5

## 2017-07-03 MED ORDER — SODIUM CHLORIDE 0.9 % IV SOLN
1000.0000 mL | Freq: Once | INTRAVENOUS | Status: AC
  Administered 2017-07-03: 1000 mL via INTRAVENOUS

## 2017-07-03 NOTE — Progress Notes (Signed)
Pt c/o tenderness to the side of his right chest port. No swelling, redness, bruising, easily accessed with brisk blood return. Infuses well. Pt experiences no discomfort when IV fluids infusing. Certain bodily movements seem to make pain worse and and tender to slight pressure. Pt to see Dr. Alvy Bimler on Friday.  Pt seems a bit discouraged with current events (left lower jaw necrosis and proposed bariatric treatment. He states "maybe they should just do surgery and take it out"  He sees the surgeon Dr. Hendricks Limes next week again on 07/10/17. Pt also not taking as much enteral feeding as he should be-only 2 cans yesterday.  "I just didn't feel like it". Encouraged to increase feedings to keep his weight and energy levels up. Denies depression or suicidal thoughts. Emotional support extended.  Ok to infuse over 1 hour - pt request

## 2017-07-03 NOTE — Patient Instructions (Signed)
Implanted Port Home Guide An implanted port is a type of central line that is placed under the skin. Central lines are used to provide IV access when treatment or nutrition needs to be given through a person's veins. Implanted ports are used for long-term IV access. An implanted port may be placed because:  You need IV medicine that would be irritating to the small veins in your hands or arms.  You need long-term IV medicines, such as antibiotics.  You need IV nutrition for a long period.  You need frequent blood draws for lab tests.  You need dialysis.  Implanted ports are usually placed in the chest area, but they can also be placed in the upper arm, the abdomen, or the leg. An implanted port has two main parts:  Reservoir. The reservoir is round and will appear as a small, raised area under your skin. The reservoir is the part where a needle is inserted to give medicines or draw blood.  Catheter. The catheter is a thin, flexible tube that extends from the reservoir. The catheter is placed into a large vein. Medicine that is inserted into the reservoir goes into the catheter and then into the vein.  How will I care for my incision site? Do not get the incision site wet. Bathe or shower as directed by your health care provider. How is my port accessed? Special steps must be taken to access the port:  Before the port is accessed, a numbing cream can be placed on the skin. This helps numb the skin over the port site.  Your health care provider uses a sterile technique to access the port. ? Your health care provider must put on a mask and sterile gloves. ? The skin over your port is cleaned carefully with an antiseptic and allowed to dry. ? The port is gently pinched between sterile gloves, and a needle is inserted into the port.  Only "non-coring" port needles should be used to access the port. Once the port is accessed, a blood return should be checked. This helps ensure that the port  is in the vein and is not clogged.  If your port needs to remain accessed for a constant infusion, a clear (transparent) bandage will be placed over the needle site. The bandage and needle will need to be changed every week, or as directed by your health care provider.  Keep the bandage covering the needle clean and dry. Do not get it wet. Follow your health care provider's instructions on how to take a shower or bath while the port is accessed.  If your port does not need to stay accessed, no bandage is needed over the port.  What is flushing? Flushing helps keep the port from getting clogged. Follow your health care provider's instructions on how and when to flush the port. Ports are usually flushed with saline solution or a medicine called heparin. The need for flushing will depend on how the port is used.  If the port is used for intermittent medicines or blood draws, the port will need to be flushed: ? After medicines have been given. ? After blood has been drawn. ? As part of routine maintenance.  If a constant infusion is running, the port may not need to be flushed.  How long will my port stay implanted? The port can stay in for as long as your health care provider thinks it is needed. When it is time for the port to come out, surgery will be   done to remove it. The procedure is similar to the one performed when the port was put in. When should I seek immediate medical care? When you have an implanted port, you should seek immediate medical care if:  You notice a bad smell coming from the incision site.  You have swelling, redness, or drainage at the incision site.  You have more swelling or pain at the port site or the surrounding area.  You have a fever that is not controlled with medicine.  This information is not intended to replace advice given to you by your health care provider. Make sure you discuss any questions you have with your health care provider. Document  Released: 11/19/2005 Document Revised: 04/26/2016 Document Reviewed: 07/27/2013 Elsevier Interactive Patient Education  2017 [employer]  

## 2017-07-04 ENCOUNTER — Ambulatory Visit: Payer: Medicare PPO | Attending: Radiation Oncology

## 2017-07-05 ENCOUNTER — Ambulatory Visit (HOSPITAL_BASED_OUTPATIENT_CLINIC_OR_DEPARTMENT_OTHER): Payer: Medicare PPO | Admitting: Hematology and Oncology

## 2017-07-05 ENCOUNTER — Ambulatory Visit (HOSPITAL_BASED_OUTPATIENT_CLINIC_OR_DEPARTMENT_OTHER): Payer: Medicare PPO

## 2017-07-05 ENCOUNTER — Telehealth: Payer: Self-pay | Admitting: Hematology and Oncology

## 2017-07-05 ENCOUNTER — Encounter: Payer: Self-pay | Admitting: Hematology and Oncology

## 2017-07-05 ENCOUNTER — Telehealth: Payer: Self-pay

## 2017-07-05 VITALS — BP 125/87 | HR 85 | Resp 16

## 2017-07-05 DIAGNOSIS — F329 Major depressive disorder, single episode, unspecified: Secondary | ICD-10-CM

## 2017-07-05 DIAGNOSIS — R1310 Dysphagia, unspecified: Secondary | ICD-10-CM | POA: Diagnosis not present

## 2017-07-05 DIAGNOSIS — R000 Tachycardia, unspecified: Secondary | ICD-10-CM

## 2017-07-05 DIAGNOSIS — M272 Inflammatory conditions of jaws: Secondary | ICD-10-CM | POA: Diagnosis not present

## 2017-07-05 DIAGNOSIS — C021 Malignant neoplasm of border of tongue: Secondary | ICD-10-CM

## 2017-07-05 DIAGNOSIS — R5381 Other malaise: Secondary | ICD-10-CM | POA: Insufficient documentation

## 2017-07-05 DIAGNOSIS — R64 Cachexia: Secondary | ICD-10-CM | POA: Diagnosis not present

## 2017-07-05 DIAGNOSIS — Y842 Radiological procedure and radiotherapy as the cause of abnormal reaction of the patient, or of later complication, without mention of misadventure at the time of the procedure: Secondary | ICD-10-CM

## 2017-07-05 DIAGNOSIS — Z95828 Presence of other vascular implants and grafts: Secondary | ICD-10-CM

## 2017-07-05 DIAGNOSIS — M8730 Other secondary osteonecrosis, unspecified bone: Secondary | ICD-10-CM

## 2017-07-05 MED ORDER — HEPARIN SOD (PORK) LOCK FLUSH 100 UNIT/ML IV SOLN
500.0000 [IU] | Freq: Once | INTRAVENOUS | Status: AC | PRN
  Administered 2017-07-05: 500 [IU]
  Filled 2017-07-05: qty 5

## 2017-07-05 MED ORDER — SODIUM CHLORIDE 0.9% FLUSH
10.0000 mL | INTRAVENOUS | Status: DC | PRN
  Administered 2017-07-05: 10 mL
  Filled 2017-07-05: qty 10

## 2017-07-05 MED ORDER — SODIUM CHLORIDE 0.9 % IV SOLN
Freq: Once | INTRAVENOUS | Status: AC
  Administered 2017-07-05: 14:00:00 via INTRAVENOUS

## 2017-07-05 MED ORDER — PREDNISONE 10 MG PO TABS: 10.0000 mg | ORAL_TABLET | Freq: Every day | ORAL | 0 refills | Status: DC

## 2017-07-05 MED FILL — predniSONE 10 MG TABS: 10 | 10 days supply | Qty: 10 | Fill #0

## 2017-07-05 NOTE — Assessment & Plan Note (Addendum)
He has persistent dysphagia I encouraged him to eat and drink as much as he could We will provide appetite stimulant I would get him follow-up with ENT and speech and language therapist assessment at home

## 2017-07-05 NOTE — Assessment & Plan Note (Signed)
He has significant physical deconditioning recently The patient is afraid that he might fall and requested consideration for admission Unfortunately, with stability of his vital signs except for tachycardia, I do not feel that admission to the hospital is currently warranted I felt that home physical therapy would be more appropriate

## 2017-07-05 NOTE — Assessment & Plan Note (Signed)
The patient has not made much progress since the last time I saw him He has developed multiple complications recently related to pneumonia, osteonecrosis of the jaw and recent shingles With decline in performance status, I recommend home health consult for RN visit, home physical therapy and evaluation along with speech therapy I will see him again next week for further supportive care and follow-up I do not feel strongly he needs to be admitted today

## 2017-07-05 NOTE — Progress Notes (Signed)
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 This caused him significant discomfort, resulting in decreased food intake.      12/28/2016 Imaging    CT of the neck on 12/28/16 had demonstrate no frank adenopathy in the neck and the tongue was not well visualized die to dental work. On the same day a CT of his chest showed emphysema of the lungs and a few nonspecific pulmonary nodules which were unchanged compared to 06/2015.      01/15/2017 PET scan    This revealed hypermetabolic activity in the left anterior tongue as well as within a small left level 2 node and small nodules in the left parotid gland. No evidence of metastatic disease distantly      01/28/2017 Pathology Results    A.TONGUE, LEFT PARTIAL GLOSSECTOMY: Invasive moderate to poorly-differentiated squamous cell carcinoma, conventional type, 2.5 cm in greatest dimension on gross examination. Invasive squamous cell carcinoma shows greatest depth of invasion of 1.5 cm. Margins uninvolved by invasive tumor (invasive squamous cell carcinoma comes to within 0.6 cm of the inked posterior margin). Negative for lymphovascular invasion Negative for perineural invasion. AJCC Pathologic Stage: pT3 pN3 pM- not applicable. See Cancer Case Summary.  SURGICAL PATHOLOGY CANCER CASE SUMMARY- LIP AND ORAL CAVITY Protocol posting date: June 2017 Procedure: Partial glossectomy Tumor site: Oral, tongue Tumor laterality: left Tumor focality: unifocal Tumor size: Greatest  dimension 2.5 cm on gross examination Histologic type: Squamous cell carcinoma, conventional Histologic grade: G2-G3, moderate to poorly differentiated Specimen margins: Uninvolved by invasive tumor Distance from closest margin: 6 mm (0.6 cm) from posterior margin Lymphovascular invasion: Not identified Perineural invasion: Not identified Regional lymph nodes: Number of lymph nodes involved: 3 Number of lymph nodes examined: 70 Laterality of lymph nodes involved: Bilateral Size of largest metastatic deposit: 9 mm (0.9 cm) Extranodal extension: very focally present in 1 lymph node (part J) Pathologic Stage Classification (pTNM, AJCC 8th Edition) Primary tumor (pT): pT3 Regional lymph nodes (pN): pN3 Distant metastasis (pM): not applicable   B.ANTERIOR DORSAL TONGUE, BIOPSY: Negative for malignancy.  C.POSTERIOR DORSAL TONGUE, BIOPSY: Negative for malignancy.  D.MID DORSAL TONGUE, BIOPSY: Negative for malignancy.  E.DEEP TONGUE, BIOPSY: Negative for malignancy.  F.ANTERIOR FLOOR OF MOUTH, BIOPSY: Negative for malignancy.  G.POSTERIOR FLOOR OF MOUTH, BIOPSY: Negative for malignancy.  H.LEVEL IA, RESECTION: Six benign lymph nodes negative for metastatic carcinoma on routine H&E staining (0/6).  I.LEVEL IB, RESECTION: Two benign lymph nodes negative for metastatic carcinoma on routine H&E staining (0/2). Benign salivary gland tissue.  J.LEFT LEVEL IIA , 3 AND 4, RESECTION: One of twenty-one lymph nodes positive for metastatic carcinoma (0.9 cm focus) with very focal extranodal extension on routine H&E staining (1/21).  K.LEVEL IIB, RESECTION: Four benign lymph nodes negative for metastatic carcinoma on  routine H&E staining (0/4). One small focus of benign salivary gland tissue.  L.RIGHT LEVEL IB, RESECTION: Three benign lymph nodes negative for metastatic carcinoma on routine H&E staining (0/3). Benign salivary gland tissue.  M.RIGHT LEVEL 2A, 3, 4, RESECTION: Two of fifteen lymph nodes positive for metastatic carcinoma (  2/15). See comment.  Comment: A small focus suspicious for metastatic carcinoma is seen on the initial H&E stained slide. Therefore, a pankeratin immunohistochemical stain is performed. The positive control stain functioned appropriately. The pankeratin immunostain highlights a few minute foci of metastatic carcinoma in 2 of the 3 lymph nodes (largest focus 0.2 mm [0.02 cm]).      01/28/2017 Surgery    He had surgery at [facility] <100 SQCM [15100] (SPLIT THICKNESS SKIN GRAFT LEG)           03/01/2017 Procedure    Successful fluoroscopic guided percutaneous gastrostomy tube placement.      03/01/2017 Procedure    Placement of a subcutaneous port device. Catheter tip at the superior cavoatrial junction and ready to be used.      03/12/2017 - 04/25/2017 Radiation Therapy    Received IMRT / 6 MV photons to tongue and bilateral neck / 60 Gy in 30 fractions.      03/13/2017 Procedure    Baseline hearing is near normal      03/15/2017 - 04/12/2017 Chemotherapy    He received cisplatin x 5 weekly doses      04/17/2017 - 04/25/2017 Hospital Admission    He was admitted to the hospital for management of severe mucositis pain, dehydration, dizziness and uncontrolled nausea       INTERVAL HISTORY: Please see below for problem oriented charting. 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 He denies recent fever or chills He denies constipation or nausea He is afraid to go home and felt that he might feel stronger being in the hospital He denies recent chest pain or shortness of breath  REVIEW OF SYSTEMS:   Constitutional: Denies fevers, chills  Eyes: Denies blurriness of vision Ears, nose, mouth, throat, and face: Denies mucositis or sore throat Respiratory: Denies cough, dyspnea or wheezes Cardiovascular: Denies palpitation, chest discomfort or lower extremity swelling Gastrointestinal:  Denies nausea, heartburn or change in bowel habits Skin: Denies abnormal skin rashes Lymphatics: Denies new lymphadenopathy or easy bruising All other systems were reviewed with the patient and are negative.  I have reviewed the past medical history, past surgical history, social history and family history with the patient and they are unchanged from previous note.  ALLERGIES:  is allergic to latex; adhesive [tape]; and codeine.  MEDICATIONS:  Current Outpatient Prescriptions  Medication Sig Dispense Refill  . aspirin 81 MG chewable tablet Chew 81 mg by mouth every morning.     Marland Kitchen azithromycin (ZITHROMAX) 500 MG tablet Take 1 tablet (500 mg total) by mouth daily. 500 mg (1 tablet) orally on day 1 followed by 250 mg/day (0.5 tablet) orally on days 2 to 5 3 tablet 0  . lidocaine-prilocaine (EMLA) cream Apply to affected area once 30 g 3  . magnesium oxide (MAG-OX) 400 (241.3 Mg) MG tablet Take 1 tablet (400 mg total) by mouth 2 (two) times daily. 60 tablet 9  . mirtazapine (REMERON SOL-TAB) 15 MG disintegrating tablet Take 1 tablet (15 mg total) by mouth  at bedtime. 30 tablet 11  . morphine (ROXANOL) 20 MG/ML  concentrated solution Take 1 mL (20 mg total) by mouth every 2 (two) hours as needed for severe pain. 240 mL 0  . ondansetron (ZOFRAN) 8 MG tablet Take 1 tablet (8 mg total) by mouth every 8 (eight) hours as needed. Start on the third day after chemotherapy. (Patient not taking: Reported on 03/21/2017) 30 tablet 1  . predniSONE (DELTASONE) 10 MG tablet Take 1 tablet (10 mg total) by mouth daily with breakfast. 10 tablet 0  . prochlorperazine (COMPAZINE) 10 MG tablet Take 1 tablet (10 mg total) by mouth every 6 (six) hours as needed (Nausea or vomiting). 30 tablet 1   No current facility-administered medications for this visit.    Facility-Administered Medications Ordered in Other Visits  Medication Dose Route Frequency Provider Last Rate Last Dose  . sodium chloride flush (NS) 0.9 % injection 10 mL  10 mL Intracatheter PRN Alvy Bimler, Malone Admire, MD   10 mL at 07/05/17 1507    PHYSICAL EXAMINATION: ECOG PERFORMANCE STATUS: 3 - Symptomatic, >50% confined to bed  Vitals:   07/05/17 1258  BP: 130/85  Pulse: (!) 115  Resp: 16   Filed Weights   07/05/17 1258  Weight: 134 lb (60.8 kg)    GENERAL:alert, no distress and comfortable.  He looks deconditioned and weak SKIN: skin color, texture, turgor are normal, no rashes or significant lesions EYES: normal, Conjunctiva are pink and non-injected, sclera clear OROPHARYNX:no exudate, no erythema and lips, buccal mucosa, and tongue normal.  No oral thrush.  Noted exposed mandible NECK: supple, thyroid normal size, non-tender, without nodularity.  Noted acquired lymphedema LYMPH:  no palpable lymphadenopathy in the cervical, axillary or inguinal LUNGS: clear to auscultation and percussion with normal breathing effort HEART: regular rate & rhythm and no murmurs and no lower extremity edema ABDOMEN:abdomen soft, non-tender and normal bowel sounds.  Feeding tube site looks okay Musculoskeletal:no cyanosis of digits and no clubbing.  Noted some muscle  wasting NEURO: alert & oriented x 3 with mild dysarthria, no focal motor/sensory deficits  LABORATORY DATA:  I have reviewed the data as listed    Component Value Date/Time   NA 131 (L) 06/24/2017 1352   NA 136 05/24/2017 0938   K 5.4 (H) 06/24/2017 1352   K 3.6 05/24/2017 0938   CL 97 (L) 06/24/2017 1352   CO2 26 06/24/2017 1352   CO2 32 (H) 05/24/2017 0938   GLUCOSE 100 (H) 06/24/2017 1352   GLUCOSE 118 05/24/2017 0938   BUN 23 (H) 06/24/2017 1352   BUN 18.5 05/24/2017 0938   CREATININE 0.77 06/24/2017 1352   CREATININE 0.8 05/24/2017 0938   CALCIUM 9.0 06/24/2017 1352   CALCIUM 9.4 05/24/2017 0938   PROT 6.6 06/24/2017 1352   PROT 6.8 05/24/2017 0938   ALBUMIN 3.2 (L) 06/24/2017 1352   ALBUMIN 3.3 (L) 05/24/2017 0938   AST 21 06/24/2017 1352   AST 27 05/24/2017 0938   ALT 20 06/24/2017 1352   ALT 30 05/24/2017 0938   ALKPHOS 57 06/24/2017 1352   ALKPHOS 56 05/24/2017 0938   BILITOT 0.9 06/24/2017 1352   BILITOT 0.67 05/24/2017 0938   GFRNONAA >60 06/24/2017 1352   GFRAA >60 06/24/2017 1352    No results found for: SPEP, UPEP  Lab Results  Component Value Date   WBC 10.5 06/24/2017   NEUTROABS 5.6 06/13/2017   HGB 11.2 (L) 06/24/2017   HCT 33.0 (L) 06/24/2017   MCV 96.5 06/24/2017  PLT 283 06/24/2017      Chemistry      Component Value Date/Time   NA 131 (L) 06/24/2017 1352   NA 136 05/24/2017 0938   K 5.4 (H) 06/24/2017 1352   K 3.6 05/24/2017 0938   CL 97 (L) 06/24/2017 1352   CO2 26 06/24/2017 1352   CO2 32 (H) 05/24/2017 0938   BUN 23 (H) 06/24/2017 1352   BUN 18.5 05/24/2017 0938   CREATININE 0.77 06/24/2017 1352   CREATININE 0.8 05/24/2017 0938      Component Value Date/Time   CALCIUM 9.0 06/24/2017 1352   CALCIUM 9.4 05/24/2017 0938   ALKPHOS 57 06/24/2017 1352   ALKPHOS 56 05/24/2017 0938   AST 21 06/24/2017 1352   AST 27 05/24/2017 0938   ALT 20 06/24/2017 1352   ALT 30 05/24/2017 0938   BILITOT 0.9 06/24/2017 1352   BILITOT 0.67  05/24/2017 0938       RADIOGRAPHIC STUDIES: I have personally reviewed the radiological images as listed and agreed with the findings in the report. Dg Chest 2 View  Result Date: 06/24/2017 CLINICAL DATA:  66 year old with current history of squamous cell carcinoma of the tongue, treated with radiation therapy and chemotherapy in May, 2018, presenting with severe right lower anterior and lateral chest pain with inspiration which began 2 days ago. EXAM: CHEST  2 VIEW COMPARISON:  06/13/2017, 07/24/2014 and earlier, including CT chest 12/28/2016. FINDINGS: Cardiac silhouette upper normal in size, unchanged. Thoracic aorta mildly tortuous and atherosclerotic, unchanged. Hilar and mediastinal contours otherwise unremarkable. Suboptimal inspiration which accounts for linear atelectasis in the lower lobes and right middle lobe. Lungs otherwise clear. Bronchovascular markings normal. No pleural effusions. Right jugular Port-A-Cath tip remains in the lower SVC. Degenerative changes involving the thoracic and upper lumbar spine. IMPRESSION: 1. Suboptimal inspiration accounts for atelectasis in the lower lobes and right middle lobe. No acute cardiopulmonary disease otherwise. 2. Thoracic aortic atherosclerosis. Electronically Signed   By: Evangeline Dakin M.D.   On: 06/24/2017 12:32   Dg Chest 2 View  Result Date: 06/13/2017 CLINICAL DATA:  Fall this morning. Hit head on mattress and bed frame. History of head and neck cancer. EXAM: CHEST  2 VIEW COMPARISON:  CT chest December 28, 2016 FINDINGS: Cardiomediastinal silhouette is normal. No pleural effusions or focal consolidations. Trachea projects midline and there is no pneumothorax. Single lumen RIGHT chest Port-A-Cath catheterization mild hyperinflation. Numerous surgical clips in the neck. Surgical clips in the included right abdomen compatible with cholecystectomy. Anterior abdominal wall herniorrhaphy. Mild degenerative change of the thoracic spine.  IMPRESSION: Hyperinflation, no acute cardiopulmonary process. Electronically Signed   By: Elon Alas M.D.   On: 06/13/2017 15:38   Ct Head Wo Contrast  Result Date: 06/13/2017 CLINICAL DATA:  Head injury after fall. EXAM: CT HEAD WITHOUT CONTRAST TECHNIQUE: Contiguous axial images were obtained from the base of the skull through the vertex without intravenous contrast. COMPARISON:  CT scan of February 11, 2013. FINDINGS: Brain: Mild diffuse cortical atrophy is noted. Mild chronic ischemic white matter disease is noted. No mass effect or midline shift is noted. Ventricular size is within normal limits. There is no evidence of mass lesion, hemorrhage or acute infarction. Vascular: No hyperdense vessel or unexpected calcification. Skull: Normal. Negative for fracture or focal lesion. Sinuses/Orbits: Mild mucosal thickening of left maxillary sinus is noted. Other: None. IMPRESSION: Mild diffuse cortical atrophy. Mild chronic ischemic white matter disease. No acute intracranial abnormality seen. Electronically Signed   By: Jeneen Rinks  Murlean Caller, M.D.   On: 06/13/2017 15:34   Ct Angio Chest Pe W/cm &/or Wo Cm  Result Date: 06/24/2017 CLINICAL DATA:  Chest pain with elevated D-dimer, history of tongue cancer EXAM: CT ANGIOGRAPHY CHEST WITH CONTRAST TECHNIQUE: Multidetector CT imaging of the chest was performed using the standard protocol during bolus administration of intravenous contrast. Multiplanar CT image reconstructions and MIPs were obtained to evaluate the vascular anatomy. CONTRAST:  100 mL Isovue 370 intravenous COMPARISON:  Radiograph 06/24/2017, CT chest 12/28/2016, 07/13/2010 FINDINGS: Cardiovascular: Satisfactory opacification of the pulmonary arteries to the segmental level. No evidence of pulmonary embolism. Aortic atherosclerosis. No aneurysm is seen. Coronary artery calcification. Heart size within normal limits. No large pericardial effusion. Mediastinum/Nodes: Mild mediastinal lymph nodes, left  peribronchial lymph node measures 8 mm. Midline trachea. No thyroid mass. Esophagus within normal limits Lungs/Pleura: Moderate emphysema. Small right-sided pleural effusion. Partial consolidation in the right lower lobe. Focal soft tissue density left posterior upper lobe pleural surface. No pneumothorax. Upper Abdomen: Stable hypodensity in the central liver. No acute abnormality is seen. Musculoskeletal: Degenerative changes. No acute or suspicious bone lesion. Review of the MIP images confirms the above findings. IMPRESSION: 1. Negative for acute pulmonary embolus 2. Small right-sided pleural effusion. Partial atelectasis or mild infiltrate in the right lower lobe. 3. Small focal consolidation left upper lobe posterior pleural surface possibly due to infiltrate or atelectasis, suggestion or interval CT follow-up to assess for resolution. 4. Mild nonspecific mediastinal adenopathy. Aortic Atherosclerosis (ICD10-I70.0) and Emphysema (ICD10-J43.9). Electronically Signed   By: Donavan Foil M.D.   On: 06/24/2017 16:22    ASSESSMENT & PLAN:  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[facility]) The patient has not made much progress since the last time I saw him He has developed multiple complications recently related to pneumonia, osteonecrosis of the jaw and recent shingles With decline in performance status, I recommend home health consult for RN visit, home physical therapy and evaluation along with speech therapy I will see him again next week for further supportive care and follow-up I do not feel strongly he needs to be admitted today  Physical deconditioning He has significant physical deconditioning recently The patient is afraid that he might fall and requested consideration for admission Unfortunately, with stability of his vital signs except for tachycardia, I do not feel that admission to the hospital is currently warranted I felt that home physical therapy would be more appropriate  Cachexia  ([place]) The patient have signs of muscle wasting and have lost more weight He has no appetite Previously, when he was on steroid treatment, he has excellent appetite I recommend he continue taking mirtazapine I recommend low-dose prednisone 10 mg daily I will reassess next week In the meantime, I recommend home health visit I will consult with dietitian to consider starting him on continuous nutritional feeding at home  Dysphagia He has persistent dysphagia I encouraged him to eat and drink as much as he could We will provide appetite stimulant I would get him follow-up with ENT and speech and language therapist assessment at home  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 Ambulatory referral to Home Health    Referral Priority:   Routine    Referral Type:   Home Health Care    Referral Reason:   Specialty Services Required    Requested Specialty:   [place]    Number of Visits Requested:   1   All questions were answered. The patient knows to call the clinic with any problems, questions or concerns. No barriers to learning was detected. I spent 30 minutes counseling the patient face to face. The total time spent in the appointment was 40 minutes and more than 50% was on counseling and review of test results     Heath Lark, MD 07/05/2017 5:54 PM

## 2017-07-05 NOTE — Assessment & Plan Note (Signed)
The patient have signs of muscle wasting and have lost more weight He has no appetite Previously, when he was on steroid treatment, he has excellent appetite I recommend he continue taking mirtazapine I recommend low-dose prednisone 10 mg daily I will reassess next week In the meantime, I recommend home health visit I will consult with dietitian to consider starting him on continuous nutritional feeding at home

## 2017-07-05 NOTE — Assessment & Plan Note (Signed)
The patient unfortunately developed osteo-radionecrosis He is currently undergoing evaluation and treatment with possible hyperbaric chamber in the future He will continue antibiotic treatment as directed I reinforced the importance of getting adequate nutrition to help with the healing process

## 2017-07-05 NOTE — Telephone Encounter (Signed)
Gave patient's wife avs and calendar.

## 2017-07-05 NOTE — Assessment & Plan Note (Signed)
The patient admits he is depressed but not suicidal I recommend he continues on Remeron I felt that low dose prednisone may perk him up a little bit I will continue to reassess next week I reassured the patient we are doing everything we can to help him feel better

## 2017-07-05 NOTE — Patient Instructions (Signed)
Dehydration, Adult Dehydration is a condition in which there is not enough fluid or water in the body. This happens when you lose more fluids than you take in. Important organs, such as the kidneys, brain, and heart, cannot function without a proper amount of fluids. Any loss of fluids from the body can lead to dehydration. Dehydration can range from mild to severe. This condition should be treated right away to prevent it from becoming severe. What are the causes? This condition may be caused by:  Vomiting.  Diarrhea.  Excessive sweating, such as from heat exposure or exercise.  Not drinking enough fluid, especially: ? When ill. ? While doing activity that requires a lot of energy.  Excessive urination.  Fever.  Infection.  Certain medicines, such as medicines that cause the body to lose excess fluid (diuretics).  Inability to access safe drinking water.  Reduced physical ability to get adequate water and food.  What increases the risk? This condition is more likely to develop in people:  Who have a poorly controlled long-term (chronic) illness, such as diabetes, heart disease, or kidney disease.  Who are age 65 or older.  Who are disabled.  Who live in a place with high altitude.  Who play endurance sports.  What are the signs or symptoms? Symptoms of mild dehydration may include:  Thirst.  Dry lips.  Slightly dry mouth.  Dry, warm skin.  Dizziness. Symptoms of moderate dehydration may include:  Very dry mouth.  Muscle cramps.  Dark urine. Urine may be the color of tea.  Decreased urine production.  Decreased tear production.  Heartbeat that is irregular or faster than normal (palpitations).  Headache.  Light-headedness, especially when you stand up from a sitting position.  Fainting (syncope). Symptoms of severe dehydration may include:  Changes in skin, such as: ? Cold and clammy skin. ? Blotchy (mottled) or pale skin. ? Skin that does  not quickly return to normal after being lightly pinched and released (poor skin turgor).  Changes in body fluids, such as: ? Extreme thirst. ? No tear production. ? Inability to sweat when body temperature is high, such as in hot weather. ? Very little urine production.  Changes in vital signs, such as: ? Weak pulse. ? Pulse that is more than 100 beats a minute when sitting still. ? Rapid breathing. ? Low blood pressure.  Other changes, such as: ? Sunken eyes. ? Cold hands and feet. ? Confusion. ? Lack of energy (lethargy). ? Difficulty waking up from sleep. ? Short-term weight loss. ? Unconsciousness. How is this diagnosed? This condition is diagnosed based on your symptoms and a physical exam. Blood and urine tests may be done to help confirm the diagnosis. How is this treated? Treatment for this condition depends on the severity. Mild or moderate dehydration can often be treated at home. Treatment should be started right away. Do not wait until dehydration becomes severe. Severe dehydration is an emergency and it needs to be treated in a hospital. Treatment for mild dehydration may include:  Drinking more fluids.  Replacing salts and minerals in your blood (electrolytes) that you may have lost. Treatment for moderate dehydration may include:  Drinking an oral rehydration solution (ORS). This is a drink that helps you replace fluids and electrolytes (rehydrate). It can be found at pharmacies and retail stores. Treatment for severe dehydration may include:  Receiving fluids through an IV tube.  Receiving an electrolyte solution through a feeding tube that is passed through your nose   and into your stomach (nasogastric tube, or NG tube).  Correcting any abnormalities in electrolytes.  Treating the underlying cause of dehydration. Follow these instructions at home:  If directed by your health care provider, drink an ORS: ? Make an ORS by following instructions on the  package. ? Start by drinking small amounts, about  cup (120 mL) every 5-10 minutes. ? Slowly increase how much you drink until you have taken the amount recommended by your health care provider.  Drink enough clear fluid to keep your urine clear or pale yellow. If you were told to drink an ORS, finish the ORS first, then start slowly drinking other clear fluids. Drink fluids such as: ? Water. Do not drink only water. Doing that can lead to having too little salt (sodium) in the body (hyponatremia). ? Ice chips. ? Fruit juice that you have added water to (diluted fruit juice). ? Low-calorie sports drinks.  Avoid: ? Alcohol. ? Drinks that contain a lot of sugar. These include high-calorie sports drinks, fruit juice that is not diluted, and soda. ? Caffeine. ? Foods that are greasy or contain a lot of fat or sugar.  Take over-the-counter and prescription medicines only as told by your health care provider.  Do not take sodium tablets. This can lead to having too much sodium in the body (hypernatremia).  Eat foods that contain a healthy balance of electrolytes, such as bananas, oranges, potatoes, tomatoes, and spinach.  Keep all follow-up visits as told by your health care provider. This is important. Contact a health care provider if:  You have abdominal pain that: ? Gets worse. ? Stays in one area (localizes).  You have a rash.  You have a stiff neck.  You are more irritable than usual.  You are sleepier or more difficult to wake up than usual.  You feel weak or dizzy.  You feel very thirsty.  You have urinated only a small amount of very dark urine over 6-8 hours. Get help right away if:  You have symptoms of severe dehydration.  You cannot drink fluids without vomiting.  Your symptoms get worse with treatment.  You have a fever.  You have a severe headache.  You have vomiting or diarrhea that: ? Gets worse. ? Does not go away.  You have blood or green matter  (bile) in your vomit.  You have blood in your stool. This may cause stool to look black and tarry.  You have not urinated in 6-8 hours.  You faint.  Your heart rate while sitting still is over 100 beats a minute.  You have trouble breathing. This information is not intended to replace advice given to you by your health care provider. Make sure you discuss any questions you have with your health care provider. Document Released: 11/19/2005 Document Revised: 06/15/2016 Document Reviewed: 01/13/2016 Elsevier Interactive Patient Education  2018 [employer]  

## 2017-07-05 NOTE — Telephone Encounter (Signed)
Home Health referral called to [facility] [phone].

## 2017-07-08 ENCOUNTER — Other Ambulatory Visit (HOSPITAL_BASED_OUTPATIENT_CLINIC_OR_DEPARTMENT_OTHER): Payer: Medicare PPO

## 2017-07-08 ENCOUNTER — Inpatient Hospital Stay (HOSPITAL_COMMUNITY): Payer: Medicare PPO

## 2017-07-08 ENCOUNTER — Encounter (HOSPITAL_COMMUNITY): Payer: Self-pay | Admitting: *Deleted

## 2017-07-08 ENCOUNTER — Ambulatory Visit: Payer: Medicare PPO | Admitting: Nutrition

## 2017-07-08 ENCOUNTER — Ambulatory Visit: Payer: Medicare PPO

## 2017-07-08 ENCOUNTER — Inpatient Hospital Stay (HOSPITAL_BASED_OUTPATIENT_CLINIC_OR_DEPARTMENT_OTHER)
Admission: AD | Admit: 2017-07-08 | Discharge: 2017-07-12 | DRG: 064 | Disposition: A | Discharge status: Other Acute Inpatient Hospital | Payer: Medicare PPO | Source: Ambulatory Visit | Attending: Family Medicine | Admitting: Family Medicine

## 2017-07-08 VITALS — BP 143/93 | HR 94 | Temp 97.7°F | Resp 20

## 2017-07-08 DIAGNOSIS — I672 Cerebral atherosclerosis: Secondary | ICD-10-CM | POA: Diagnosis not present

## 2017-07-08 DIAGNOSIS — Z7982 Long term (current) use of aspirin: Secondary | ICD-10-CM

## 2017-07-08 DIAGNOSIS — G92 Toxic encephalopathy: Secondary | ICD-10-CM | POA: Diagnosis present

## 2017-07-08 DIAGNOSIS — M879 Osteonecrosis, unspecified: Secondary | ICD-10-CM | POA: Diagnosis not present

## 2017-07-08 DIAGNOSIS — I63411 Cerebral infarction due to embolism of right middle cerebral artery: Secondary | ICD-10-CM | POA: Diagnosis not present

## 2017-07-08 DIAGNOSIS — K9423 Gastrostomy malfunction: Secondary | ICD-10-CM | POA: Diagnosis not present

## 2017-07-08 DIAGNOSIS — Y92230 Patient room in hospital as the place of occurrence of the external cause: Secondary | ICD-10-CM | POA: Diagnosis not present

## 2017-07-08 DIAGNOSIS — M272 Inflammatory conditions of jaws: Secondary | ICD-10-CM | POA: Diagnosis not present

## 2017-07-08 DIAGNOSIS — I739 Peripheral vascular disease, unspecified: Secondary | ICD-10-CM | POA: Diagnosis present

## 2017-07-08 DIAGNOSIS — J90 Pleural effusion, not elsewhere classified: Secondary | ICD-10-CM

## 2017-07-08 DIAGNOSIS — I2510 Atherosclerotic heart disease of native coronary artery without angina pectoris: Secondary | ICD-10-CM | POA: Diagnosis present

## 2017-07-08 DIAGNOSIS — Z7952 Long term (current) use of systemic steroids: Secondary | ICD-10-CM

## 2017-07-08 DIAGNOSIS — C140 Malignant neoplasm of pharynx, unspecified: Secondary | ICD-10-CM | POA: Diagnosis present

## 2017-07-08 DIAGNOSIS — R5381 Other malaise: Secondary | ICD-10-CM | POA: Diagnosis not present

## 2017-07-08 DIAGNOSIS — E871 Hypo-osmolality and hyponatremia: Secondary | ICD-10-CM | POA: Diagnosis not present

## 2017-07-08 DIAGNOSIS — Z91048 Other nonmedicinal substance allergy status: Secondary | ICD-10-CM

## 2017-07-08 DIAGNOSIS — R29703 NIHSS score 3: Secondary | ICD-10-CM | POA: Diagnosis not present

## 2017-07-08 DIAGNOSIS — D649 Anemia, unspecified: Secondary | ICD-10-CM | POA: Diagnosis present

## 2017-07-08 DIAGNOSIS — I723 Aneurysm of iliac artery: Secondary | ICD-10-CM | POA: Diagnosis not present

## 2017-07-08 DIAGNOSIS — Z91040 Latex allergy status: Secondary | ICD-10-CM

## 2017-07-08 DIAGNOSIS — Z66 Do not resuscitate: Secondary | ICD-10-CM | POA: Diagnosis not present

## 2017-07-08 DIAGNOSIS — M1990 Unspecified osteoarthritis, unspecified site: Secondary | ICD-10-CM | POA: Diagnosis present

## 2017-07-08 DIAGNOSIS — C021 Malignant neoplasm of border of tongue: Secondary | ICD-10-CM

## 2017-07-08 DIAGNOSIS — F3289 Other specified depressive episodes: Secondary | ICD-10-CM | POA: Diagnosis not present

## 2017-07-08 DIAGNOSIS — E785 Hyperlipidemia, unspecified: Secondary | ICD-10-CM | POA: Diagnosis not present

## 2017-07-08 DIAGNOSIS — R531 Weakness: Secondary | ICD-10-CM | POA: Diagnosis not present

## 2017-07-08 DIAGNOSIS — R64 Cachexia: Secondary | ICD-10-CM | POA: Diagnosis present

## 2017-07-08 DIAGNOSIS — I10 Essential (primary) hypertension: Secondary | ICD-10-CM | POA: Diagnosis present

## 2017-07-08 DIAGNOSIS — Z9049 Acquired absence of other specified parts of digestive tract: Secondary | ICD-10-CM

## 2017-07-08 DIAGNOSIS — Z79899 Other long term (current) drug therapy: Secondary | ICD-10-CM

## 2017-07-08 DIAGNOSIS — R109 Unspecified abdominal pain: Secondary | ICD-10-CM | POA: Diagnosis present

## 2017-07-08 DIAGNOSIS — J910 Malignant pleural effusion: Secondary | ICD-10-CM | POA: Diagnosis not present

## 2017-07-08 DIAGNOSIS — R441 Visual hallucinations: Secondary | ICD-10-CM | POA: Diagnosis present

## 2017-07-08 DIAGNOSIS — Z681 Body mass index (BMI) 19 or less, adult: Secondary | ICD-10-CM | POA: Diagnosis not present

## 2017-07-08 DIAGNOSIS — R1310 Dysphagia, unspecified: Secondary | ICD-10-CM | POA: Diagnosis present

## 2017-07-08 DIAGNOSIS — D630 Anemia in neoplastic disease: Secondary | ICD-10-CM | POA: Diagnosis present

## 2017-07-08 DIAGNOSIS — E878 Other disorders of electrolyte and fluid balance, not elsewhere classified: Secondary | ICD-10-CM | POA: Diagnosis not present

## 2017-07-08 DIAGNOSIS — I6340 Cerebral infarction due to embolism of unspecified cerebral artery: Secondary | ICD-10-CM | POA: Diagnosis present

## 2017-07-08 DIAGNOSIS — I459 Conduction disorder, unspecified: Secondary | ICD-10-CM | POA: Diagnosis present

## 2017-07-08 DIAGNOSIS — Z7189 Other specified counseling: Secondary | ICD-10-CM | POA: Diagnosis not present

## 2017-07-08 DIAGNOSIS — Z9221 Personal history of antineoplastic chemotherapy: Secondary | ICD-10-CM

## 2017-07-08 DIAGNOSIS — Z23 Encounter for immunization: Secondary | ICD-10-CM | POA: Diagnosis present

## 2017-07-08 DIAGNOSIS — Z95828 Presence of other vascular implants and grafts: Secondary | ICD-10-CM

## 2017-07-08 DIAGNOSIS — Z923 Personal history of irradiation: Secondary | ICD-10-CM

## 2017-07-08 DIAGNOSIS — M6281 Muscle weakness (generalized): Secondary | ICD-10-CM | POA: Diagnosis present

## 2017-07-08 DIAGNOSIS — I361 Nonrheumatic tricuspid (valve) insufficiency: Secondary | ICD-10-CM | POA: Diagnosis not present

## 2017-07-08 DIAGNOSIS — Z885 Allergy status to narcotic agent status: Secondary | ICD-10-CM

## 2017-07-08 DIAGNOSIS — R443 Hallucinations, unspecified: Secondary | ICD-10-CM | POA: Diagnosis not present

## 2017-07-08 DIAGNOSIS — R410 Disorientation, unspecified: Secondary | ICD-10-CM

## 2017-07-08 DIAGNOSIS — Z792 Long term (current) use of antibiotics: Secondary | ICD-10-CM

## 2017-07-08 DIAGNOSIS — F4321 Adjustment disorder with depressed mood: Secondary | ICD-10-CM | POA: Diagnosis present

## 2017-07-08 DIAGNOSIS — E43 Unspecified severe protein-calorie malnutrition: Secondary | ICD-10-CM | POA: Diagnosis not present

## 2017-07-08 DIAGNOSIS — Z87891 Personal history of nicotine dependence: Secondary | ICD-10-CM

## 2017-07-08 DIAGNOSIS — J449 Chronic obstructive pulmonary disease, unspecified: Secondary | ICD-10-CM | POA: Diagnosis not present

## 2017-07-08 DIAGNOSIS — E876 Hypokalemia: Secondary | ICD-10-CM | POA: Diagnosis present

## 2017-07-08 DIAGNOSIS — I252 Old myocardial infarction: Secondary | ICD-10-CM

## 2017-07-08 DIAGNOSIS — R9402 Abnormal brain scan: Secondary | ICD-10-CM | POA: Diagnosis not present

## 2017-07-08 LAB — COMPREHENSIVE METABOLIC PANEL
ALBUMIN: 2.5 g/dL — AB (ref 3.5–5.0)
ALK PHOS: 51 U/L (ref 38–126)
ALT: 38 U/L (ref 0–55)
ALT: 31 U/L (ref 17–63)
ANION GAP: 9 meq/L (ref 3–11)
ANION GAP: 10 (ref 5–15)
AST: 50 U/L — AB (ref 5–34)
AST: 45 U/L — ABNORMAL HIGH (ref 15–41)
Albumin: 2.7 g/dL — ABNORMAL LOW (ref 3.5–5.0)
Alkaline Phosphatase: 58 U/L (ref 40–150)
BILIRUBIN TOTAL: 1.5 mg/dL — AB (ref 0.3–1.2)
BUN: 15.6 mg/dL (ref 7.0–26.0)
BUN: 12 mg/dL (ref 6–20)
CALCIUM: 13.6 mg/dL — AB (ref 8.4–10.4)
CALCIUM: 11.1 mg/dL — AB (ref 8.9–10.3)
CHLORIDE: 93 meq/L — AB (ref 98–109)
CO2: 30 meq/L — AB (ref 22–29)
CO2: 26 mmol/L (ref 22–32)
CREATININE: 0.8 mg/dL (ref 0.7–1.3)
Chloride: 94 mmol/L — ABNORMAL LOW (ref 101–111)
Creatinine, Ser: 0.55 mg/dL — ABNORMAL LOW (ref 0.61–1.24)
GFR calc non Af Amer: >60 mL/min (ref >60)
GLUCOSE: 105 mg/dL — AB (ref 65–99)
Glucose: 116 mg/dl (ref 70–140)
POTASSIUM: 3.9 meq/L (ref 3.5–5.1)
Potassium: 3.9 mmol/L (ref 3.5–5.1)
SODIUM: 132 meq/L — AB (ref 136–145)
Sodium: 130 mmol/L — ABNORMAL LOW (ref 135–145)
TOTAL PROTEIN: 6.6 g/dL (ref 6.5–8.1)
Total Bilirubin: 0.73 mg/dL (ref 0.20–1.20)
Total Protein: 7.3 g/dL (ref 6.4–8.3)

## 2017-07-08 LAB — CBC WITH DIFFERENTIAL/PLATELET
BASO%: 0.1 % (ref 0.0–2.0)
Basophils Absolute: 0 10*3/uL (ref 0.0–0.1)
EOS ABS: 0 10*3/uL (ref 0.0–0.5)
EOS%: 0.5 % (ref 0.0–7.0)
HCT: 37.6 % — ABNORMAL LOW (ref 38.4–49.9)
HEMOGLOBIN: 12.3 g/dL — AB (ref 13.0–17.1)
LYMPH%: 7 % — AB (ref 14.0–49.0)
MCH: 32.3 pg (ref 27.2–33.4)
MCHC: 32.7 g/dL (ref 32.0–36.0)
MCV: 98.7 fL — AB (ref 79.3–98.0)
MONO#: 0.8 10*3/uL (ref 0.1–0.9)
MONO%: 9.7 % (ref 0.0–14.0)
NEUT%: 82.7 % — ABNORMAL HIGH (ref 39.0–75.0)
NEUTROS ABS: 6.7 10*3/uL — AB (ref 1.5–6.5)
PLATELETS: 414 10*3/uL — AB (ref 140–400)
RBC: 3.81 10*6/uL — AB (ref 4.20–5.82)
RDW: 14.8 % — AB (ref 11.0–14.6)
WBC: 8.2 10*3/uL (ref 4.0–10.3)
lymph#: 0.6 10*3/uL — ABNORMAL LOW (ref 0.9–3.3)

## 2017-07-08 LAB — OSMOLALITY: Osmolality: 276 mOsm/kg (ref 275–295)

## 2017-07-08 LAB — GLUCOSE, CAPILLARY
GLUCOSE-CAPILLARY: 119 mg/dL — AB (ref 65–99)
Glucose-Capillary: 112 mg/dL — ABNORMAL HIGH (ref 65–99)
Glucose-Capillary: 114 mg/dL — ABNORMAL HIGH (ref 65–99)

## 2017-07-08 LAB — MAGNESIUM
MAGNESIUM: 1.4 mg/dL — AB (ref 1.5–2.5)
Magnesium: 1.2 mg/dL — ABNORMAL LOW (ref 1.7–2.4)

## 2017-07-08 LAB — OSMOLALITY, URINE: Osmolality, Ur: 482 mOsm/kg (ref 300–900)

## 2017-07-08 LAB — PHOSPHORUS: Phosphorus: 2.9 mg/dL (ref 2.5–4.6)

## 2017-07-08 LAB — TSH: TSH: 0.482 u[IU]/mL (ref 0.350–4.500)

## 2017-07-08 LAB — SODIUM, URINE, RANDOM: Sodium, Ur: 144 mmol/L

## 2017-07-08 LAB — CORTISOL: CORTISOL PLASMA: 21.6 ug/dL

## 2017-07-08 MED ORDER — INSULIN ASPART 100 UNIT/ML ~~LOC~~ SOLN: 0.0000 [IU] | Freq: Every day | SUBCUTANEOUS | Status: DC

## 2017-07-08 MED ORDER — OSMOLITE 1.5 CAL PO LIQD
1000.0000 mL | ORAL | Status: DC
  Administered 2017-07-08 – 2017-07-10 (×3): 1000 mL
  Filled 2017-07-08 (×5): qty 1000

## 2017-07-08 MED ORDER — IOPAMIDOL (ISOVUE-300) INJECTION 61%
INTRAVENOUS | Status: AC
  Filled 2017-07-08: qty 100

## 2017-07-08 MED ORDER — CALCITONIN (SALMON) 200 UNIT/ACT NA SOLN
1.0000 | Freq: Every day | NASAL | Status: DC
  Administered 2017-07-09 – 2017-07-11 (×3): 1 via NASAL
  Filled 2017-07-08: qty 3.7

## 2017-07-08 MED ORDER — ENSURE ENLIVE PO LIQD
237.0000 mL | Freq: Two times a day (BID) | ORAL | Status: DC
  Administered 2017-07-08 – 2017-07-12 (×8): 237 mL via ORAL

## 2017-07-08 MED ORDER — SODIUM CHLORIDE 0.9% FLUSH
10.0000 mL | INTRAVENOUS | Status: DC | PRN
  Administered 2017-07-09 – 2017-07-12 (×4): 10 mL
  Filled 2017-07-08 (×4): qty 40

## 2017-07-08 MED ORDER — SODIUM CHLORIDE 0.9 % IV SOLN
Freq: Once | INTRAVENOUS | Status: AC
  Administered 2017-07-08: 09:00:00 via INTRAVENOUS

## 2017-07-08 MED ORDER — MAGIC MOUTHWASH W/LIDOCAINE
10.0000 mL | Freq: Four times a day (QID) | ORAL | Status: DC | PRN
  Filled 2017-07-08: qty 10

## 2017-07-08 MED ORDER — PNEUMOCOCCAL VAC POLYVALENT 25 MCG/0.5ML IJ INJ
0.5000 mL | INJECTION | INTRAMUSCULAR | Status: AC
  Administered 2017-07-09: 0.5 mL via INTRAMUSCULAR
  Filled 2017-07-08: qty 0.5

## 2017-07-08 MED ORDER — ENOXAPARIN SODIUM 40 MG/0.4ML ~~LOC~~ SOLN
40.0000 mg | SUBCUTANEOUS | Status: DC
  Administered 2017-07-08: 40 mg via SUBCUTANEOUS
  Filled 2017-07-08: qty 0.4

## 2017-07-08 MED ORDER — DEXAMETHASONE SODIUM PHOSPHATE 4 MG/ML IJ SOLN
4.0000 mg | Freq: Two times a day (BID) | INTRAMUSCULAR | Status: DC
  Administered 2017-07-08 – 2017-07-12 (×9): 4 mg via INTRAVENOUS
  Filled 2017-07-08 (×10): qty 1

## 2017-07-08 MED ORDER — INSULIN ASPART 100 UNIT/ML ~~LOC~~ SOLN
0.0000 [IU] | Freq: Three times a day (TID) | SUBCUTANEOUS | Status: DC
  Administered 2017-07-10 – 2017-07-12 (×5): 1 [IU] via SUBCUTANEOUS
  Administered 2017-07-12: 2 [IU] via SUBCUTANEOUS

## 2017-07-08 MED ORDER — JEVITY 1.2 CAL PO LIQD: 1000.0000 mL | ORAL | Status: DC

## 2017-07-08 MED ORDER — MORPHINE SULFATE (PF) 2 MG/ML IV SOLN
1.0000 mg | INTRAVENOUS | Status: DC | PRN
  Administered 2017-07-09: 1 mg via INTRAVENOUS
  Filled 2017-07-08: qty 1

## 2017-07-08 MED ORDER — ASPIRIN 81 MG PO CHEW
CHEWABLE_TABLET | ORAL | Status: AC
  Filled 2017-07-08: qty 1

## 2017-07-08 MED ORDER — ACETAMINOPHEN 650 MG RE SUPP: 650.0000 mg | Freq: Four times a day (QID) | RECTAL | Status: DC | PRN

## 2017-07-08 MED ORDER — SODIUM CHLORIDE 0.9 % IV SOLN
INTRAVENOUS | Status: DC
  Administered 2017-07-08 – 2017-07-10 (×3): via INTRAVENOUS

## 2017-07-08 MED ORDER — ACETAMINOPHEN 325 MG PO TABS
650.0000 mg | ORAL_TABLET | Freq: Four times a day (QID) | ORAL | Status: DC | PRN
  Administered 2017-07-08: 650 mg via ORAL
  Filled 2017-07-08: qty 2

## 2017-07-08 MED ORDER — MORPHINE SULFATE (CONCENTRATE) 10 MG/0.5ML PO SOLN
20.0000 mg | ORAL | Status: DC | PRN
  Administered 2017-07-10 – 2017-07-12 (×3): 20 mg via ORAL
  Filled 2017-07-08 (×3): qty 1

## 2017-07-08 MED ORDER — OXYCODONE HCL 5 MG PO TABS
5.0000 mg | ORAL_TABLET | Freq: Four times a day (QID) | ORAL | Status: DC | PRN
  Administered 2017-07-08: 5 mg via ORAL
  Filled 2017-07-08: qty 1

## 2017-07-08 MED ORDER — MIRTAZAPINE 15 MG PO TBDP
15.0000 mg | ORAL_TABLET | Freq: Every day | ORAL | Status: DC
  Administered 2017-07-08 – 2017-07-11 (×3): 15 mg via ORAL
  Filled 2017-07-08 (×5): qty 1

## 2017-07-08 MED ORDER — ASPIRIN 81 MG PO CHEW
81.0000 mg | CHEWABLE_TABLET | Freq: Every morning | ORAL | Status: DC
  Administered 2017-07-09: 81 mg via ORAL
  Filled 2017-07-08: qty 1

## 2017-07-08 MED ORDER — OXYCODONE HCL 5 MG PO TABS
5.0000 mg | ORAL_TABLET | Freq: Four times a day (QID) | ORAL | Status: DC | PRN
  Administered 2017-07-11: 5 mg via ORAL
  Filled 2017-07-08 (×3): qty 1

## 2017-07-08 MED ORDER — PROCHLORPERAZINE MALEATE 10 MG PO TABS
10.0000 mg | ORAL_TABLET | Freq: Four times a day (QID) | ORAL | Status: DC | PRN
  Filled 2017-07-08: qty 1

## 2017-07-08 MED ORDER — IOPAMIDOL (ISOVUE-300) INJECTION 61%
100.0000 mL | Freq: Once | INTRAVENOUS | Status: AC | PRN
Start: 2017-07-08 — End: 2017-07-08
  Administered 2017-07-08: 100 mL via INTRAVENOUS

## 2017-07-08 NOTE — Progress Notes (Signed)
Nutrition follow-up completed with patient in the infusion room who is status post treatment for tongue cancer. Weight was documented as 134 pounds on August 3, down from usual body weight of 190 pounds and 166.3 pounds April 3. This reflects 32 pound weight loss over 4 months. Tube feeding and supplies are provided by the [facility] hospital.  Severe malnutrition in the context of chronic illness related to tongue cancer and associated treatments as evidenced by severe depletion of muscle loss and fat stores, 19% weight loss over 4 months and less than 75% energy intake for greater than one month  I spoke with patient's wife who reports patient is being admitted today secondary to hypercalcemia.  Patient's wife reports patient has been refusing food and tube feeding for greater than one week.  Labs noted: Magnesium 1.4, sodium 132, albumin 2.7, calcium 13.6.  Estimated nutrition needs: 2130-2330 calories, 95-105 grams protein, 2.3 L fluid.  Patient has been prescribed Nutren 1.5, goal rate of 6 bottles daily to provide 2250 cal, 102 g protein, 1626 mL free water.  Patient has had some difficulty tolerating tube feeding at goal rate for several months. Patient had agreed to continuous tube feeding during last RD visit, however, he never contacted the [facility] hospital for continuous tube feeding pump.  He also previously ran out of tube feeding and had not notified the [place] to deliver more. I provided him with TF until more could be delivered. 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  Nutrition diagnosis: Unintended weight loss continues.  Intervention:  Patient would benefit from continuous tube feeding to provide greater than 90% of estimated nutrition needs.   Patient is at high risk for refeeding syndrome secondary to weight loss and malnutrition as well as inadequate energy intake for some time. I will contact inpatient RD to  communicate patient history. I will continue to follow patient once discharged as needed.  Monitoring, evaluation, goals: Patient will tolerate slow advancement of continuous tube feeding to meet greater than 90% of estimated nutrition needs without exhibiting refeeding syndrome.  Next visit: To be scheduled.  **Disclaimer: This note was dictated with voice recognition software. Similar sounding words can inadvertently be transcribed and this note may contain transcription errors which may not have been corrected upon publication of note.**

## 2017-07-08 NOTE — Progress Notes (Signed)
Pt to begin TF when pharmacy sends up appropriate sized bottle of feed.

## 2017-07-08 NOTE — Progress Notes (Signed)
Initial Nutrition Assessment  DOCUMENTATION CODES:   Severe malnutrition in context of chronic illness  INTERVENTION:   Monitor magnesium, potassium, and phosphorus daily for at least 3 days, MD to replete as needed, as pt is at risk for refeeding syndrome given severe malnutrition, not meeting estimated nutritional needs for the past 3 months and low Mg levels.  Initiate Osmolite 1.5 @ 15 ml/hr via PEG and increase by 10 ml every 24 hours to goal rate of 65 ml/hr.    Tube feeding regimen at goal rate provides 2340 kcal (100% of needs), 97 grams of protein, and 1188 ml of H2O.   RD will continue to monitor  NUTRITION DIAGNOSIS:   Malnutrition (severe) related to chronic illness, cancer and cancer related treatments as evidenced by percent weight loss, energy intake < or equal to 75% for > or equal to 1 month, severe depletion of body fat, severe depletion of muscle mass  GOAL:   Patient will meet greater than or equal to 90% of their needs  MONITOR:   PO intake, Labs, Weight trends, Supplement acceptance, TF tolerance, I & O's  REASON FOR ASSESSMENT:   Consult Enteral/tube feeding initiation and management  ASSESSMENT:    66 y.o. male is admitted to the hospital after presentation to the [facility] with confusion and malignant hypercalcemia.  This RD alerted by [place] RD that the patient would be admitted. Discussed patient's needs and risk for refeeding syndrome. Pt was receiving bolus feeds of Nutren 1.5 but has not been compliant with recommendations. Pt was in the process of getting a pump for continuous feeds but never received his pump. Due to the patient's inadequate nutrition recently and refeeding syndrome risk will initiate continuous feeds of Osmolite 1.5 (Nutren formula not available on hospital formulary).   When RD visited patient, no family was present. Pt reports drinking some water and ginger ale at home. States he tried to drink the Osmolite at home.  Explained to patient need of using a different formula. Pt a bit confused at time of visit. States he is more concerned about having a BM at this time. RD will continue to follow for advancement and tolerance of feeds.  Per chart review, pt has lost 12 lb since 5/16 (8% wt loss x 2.5 months, significant for time frame). Nutrition-Focused physical exam completed. Findings are severe fat depletion, severe muscle depletion, and no edema.   Medications: IV Decadron every 12 hours, Remeron sol-tab daily Labs reviewed: Low Na, Mg  Diet Order:     Skin:  Reviewed, no issues  Last BM:  PTA  Height:   Ht Readings from Last 1 Encounters:  07/08/17 5\' 8"  (1.727 m)    Weight:   Wt Readings from Last 1 Encounters:  07/08/17 134 lb 7.7 oz (61 kg)    Ideal Body Weight:  70 kg  BMI:  Body mass index is 20.45 kg/m.  Estimated Nutritional Needs:   Kcal:  2150-2350  Protein:  90-105g  Fluid:  2.3L/day  EDUCATION NEEDS:   Education needs addressed  Clayton Bibles, MS, RD, LDN Pager: [phone] After Hours Pager: [phone]
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
## 2017-07-09 ENCOUNTER — Encounter (HOSPITAL_COMMUNITY): Payer: Self-pay | Admitting: Interventional Radiology

## 2017-07-09 ENCOUNTER — Telehealth: Payer: Self-pay | Admitting: Hematology and Oncology

## 2017-07-09 ENCOUNTER — Inpatient Hospital Stay (HOSPITAL_COMMUNITY): Payer: Medicare PPO

## 2017-07-09 DIAGNOSIS — E878 Other disorders of electrolyte and fluid balance, not elsewhere classified: Secondary | ICD-10-CM

## 2017-07-09 DIAGNOSIS — J90 Pleural effusion, not elsewhere classified: Secondary | ICD-10-CM

## 2017-07-09 HISTORY — PX: IR REPLC GASTRO/COLONIC TUBE PERCUT W/FLUORO: IMG2333

## 2017-07-09 HISTORY — PX: IR THORACENTESIS ASP PLEURAL SPACE W/IMG GUIDE: IMG5380

## 2017-07-09 LAB — PTH, INTACT AND CALCIUM
CALCIUM TOTAL (PTH): 11.2 mg/dL — AB (ref 8.6–10.2)
PTH: 8 pg/mL — AB (ref 15–65)

## 2017-07-09 LAB — COMPREHENSIVE METABOLIC PANEL
ALK PHOS: 48 U/L (ref 38–126)
ALT: 28 U/L (ref 17–63)
AST: 29 U/L (ref 15–41)
Albumin: 2.6 g/dL — ABNORMAL LOW (ref 3.5–5.0)
Anion gap: 10 (ref 5–15)
BUN: 12 mg/dL (ref 6–20)
CALCIUM: 10.9 mg/dL — AB (ref 8.9–10.3)
CHLORIDE: 94 mmol/L — AB (ref 101–111)
CO2: 28 mmol/L (ref 22–32)
CREATININE: 0.54 mg/dL — AB (ref 0.61–1.24)
Glucose, Bld: 110 mg/dL — ABNORMAL HIGH (ref 65–99)
Potassium: 2.8 mmol/L — ABNORMAL LOW (ref 3.5–5.1)
Sodium: 132 mmol/L — ABNORMAL LOW (ref 135–145)
Total Bilirubin: 0.6 mg/dL (ref 0.3–1.2)
Total Protein: 6.6 g/dL (ref 6.5–8.1)

## 2017-07-09 LAB — GLUCOSE, CAPILLARY
GLUCOSE-CAPILLARY: 106 mg/dL — AB (ref 65–99)
Glucose-Capillary: 125 mg/dL — ABNORMAL HIGH (ref 65–99)
Glucose-Capillary: 116 mg/dL — ABNORMAL HIGH (ref 65–99)
Glucose-Capillary: 95 mg/dL (ref 65–99)
Glucose-Capillary: 104 mg/dL — ABNORMAL HIGH (ref 65–99)

## 2017-07-09 LAB — MAGNESIUM: MAGNESIUM: 1.2 mg/dL — AB (ref 1.7–2.4)

## 2017-07-09 LAB — PHOSPHORUS: Phosphorus: 3.1 mg/dL (ref 2.5–4.6)

## 2017-07-09 MED ORDER — FENTANYL CITRATE (PF) 100 MCG/2ML IJ SOLN
INTRAMUSCULAR | Status: AC
  Administered 2017-07-09: 25 ug via INTRAVENOUS
  Filled 2017-07-09: qty 2

## 2017-07-09 MED ORDER — LIDOCAINE HCL 1 % IJ SOLN
INTRAMUSCULAR | Status: AC
  Filled 2017-07-09: qty 20

## 2017-07-09 MED ORDER — POTASSIUM CHLORIDE 20 MEQ PO PACK: 40.0000 meq | PACK | ORAL | Status: DC

## 2017-07-09 MED ORDER — MAGNESIUM SULFATE 2 GM/50ML IV SOLN
2.0000 g | Freq: Once | INTRAVENOUS | Status: AC
  Administered 2017-07-09: 2 g via INTRAVENOUS
  Filled 2017-07-09: qty 50

## 2017-07-09 MED ORDER — LIDOCAINE HCL 1 % IJ SOLN
INTRAMUSCULAR | Status: AC | PRN
  Administered 2017-07-09: 10 mL

## 2017-07-09 MED ORDER — IOPAMIDOL (ISOVUE-300) INJECTION 61%
INTRAVENOUS | Status: AC
  Administered 2017-07-09: 15 mL via INTRAVENOUS
  Filled 2017-07-09: qty 50

## 2017-07-09 MED ORDER — FENTANYL CITRATE (PF) 100 MCG/2ML IJ SOLN
25.0000 ug | Freq: Once | INTRAMUSCULAR | Status: AC
  Administered 2017-07-09: 25 ug via INTRAVENOUS

## 2017-07-09 MED ORDER — IOPAMIDOL (ISOVUE-300) INJECTION 61%
50.0000 mL | Freq: Once | INTRAVENOUS | Status: AC | PRN
  Administered 2017-07-09: 15 mL via INTRAVENOUS

## 2017-07-09 MED ORDER — LIDOCAINE VISCOUS 2 % MT SOLN
OROMUCOSAL | Status: AC
  Filled 2017-07-09: qty 15

## 2017-07-09 MED ORDER — LIDOCAINE VISCOUS 2 % MT SOLN
OROMUCOSAL | Status: AC | PRN
  Administered 2017-07-09: 15 mL via OROMUCOSAL

## 2017-07-09 MED ORDER — ALPRAZOLAM 1 MG PO TABS
1.0000 mg | ORAL_TABLET | Freq: Once | ORAL | Status: AC | PRN
Start: 2017-07-09 — End: 2017-07-09
  Administered 2017-07-09: 1 mg via ORAL
  Filled 2017-07-09: qty 1

## 2017-07-09 MED ORDER — POTASSIUM CHLORIDE CRYS ER 20 MEQ PO TBCR
40.0000 meq | EXTENDED_RELEASE_TABLET | ORAL | Status: AC
  Administered 2017-07-09 (×2): 40 meq via ORAL
  Filled 2017-07-09 (×2): qty 2

## 2017-07-09 NOTE — Progress Notes (Signed)
[name]   DOB:1951-07-23   DG#:[id number]    Subjective: The patient is mildly disoriented.  He denies pain but complain of weakness.  Unfortunately, he accidentally pulled out his feeding tube.  Objective:  Vitals:   07/08/17 2030 07/09/17 0505  BP: (!) 155/96 (!) 167/93  Pulse: 87 84  Resp: 18 18  Temp: 98.5 F (36.9 C) 97.6 F (36.4 C)     Intake/Output Summary (Last 24 hours) at 07/09/17 5956 Last data filed at 07/09/17 0600  Gross per 24 hour  Intake          2368.25 ml  Output             1600 ml  Net           768.25 ml    GENERAL:alert, no distress and comfortable SKIN: skin color, texture, turgor are normal, no rashes or significant lesions EYES: normal, Conjunctiva are pink and non-injected, sclera clear OROPHARYNX:no exudate, no erythema and lips, buccal mucosa, and tongue normal  NECK: supple, thyroid normal size, non-tender, without nodularity LYMPH:  no palpable lymphadenopathy in the cervical, axillary or inguinal LUNGS: Reduced breath sounds on the right lung base HEART: regular rate & rhythm and no murmurs and no lower extremity edema ABDOMEN:abdomen soft, non-tender and normal bowel sounds Musculoskeletal:no cyanosis of digits and no clubbing  NEURO: He is not fully oriented in time fluent speech, no focal motor/sensory deficits   Labs:  Lab Results  Component Value Date   WBC 8.2 07/08/2017   HGB 12.3 (L) 07/08/2017   HCT 37.6 (L) 07/08/2017   MCV 98.7 (H) 07/08/2017   PLT 414 (H) 07/08/2017   NEUTROABS 6.7 (H) 07/08/2017    Lab Results  Component Value Date   NA 132 (L) 07/09/2017   K 2.8 (L) 07/09/2017   CL 94 (L) 07/09/2017   CO2 28 07/09/2017    Studies:  Ct Soft Tissue Neck W Contrast  Result Date: 07/08/2017 CLINICAL DATA:  66 y/o  M; neck mass with history of malignancy. EXAM: CT NECK WITH CONTRAST TECHNIQUE: Multidetector CT imaging of the neck was performed using the standard protocol following the bolus administration of  intravenous contrast. CONTRAST:  133mL ISOVUE-300 IOPAMIDOL (ISOVUE-300) INJECTION 61% COMPARISON:  06/24/2017 CT chest. 01/14/2017 outside PET-CT. 12/28/2016 CT neck. FINDINGS: Pharynx and larynx: Postsurgical changes within the left aspects of the tongue compatible with history of partial left glossectomy. The region of the oral cavity is motion degraded however no discrete enhancing focus is identified to suggest recurrent or residual disease. Salivary glands: The bilateral submandibular gland resection. Normal parotid glands. Thyroid: 3 mm nodule in the right lobe of thyroid. Lymph nodes: Status post bilateral radical neck dissection. No recurrent adenopathy in the right cervical chain identified. There is diffuse infiltrative soft tissue in the left submandibular space and anterior cervical triangle extending from the mandibular angle to the level of laryngeal cartilage. There are several discrete rim enhancing irregular foci: 1. Left submandibular, 17 x 13 mm, series 8: Image 50. 2. Left lateral to hyoid, 15 x 9 mm, 8:52. 3. Lateral to thyroid cartilage, 6 mm, 2:61. Additionally, there rim enhancing lesions anterior to the manubrium measuring 11 x 21 mm on the left and 11 x 15 mm on the right (8:105). Vascular: Small caliber left upper internal jugular vein. Calcific atherosclerosis of carotid bifurcations without significant stenosis. Limited intracranial: Negative. Visualized orbits: Negative. Mastoids and visualized paranasal sinuses: Moderate bilateral paranasal sinus mucosal thickening. Skeleton: Mild cervical  spondylosis.  No high-grade canal stenosis. Upper chest: Large right pleural effusion. Markedly increased in size from prior CT of chest. Other: None. IMPRESSION: 1. Partial left glossectomy. Oral cavity region is motion degraded, however, no discrete enhancing focus is identified to suggest recurrent or residual disease. 2. Bilateral radical neck dissection. No recurrent adenopathy or mass in right  cervical chain identified. 3. Confluent infiltrative soft tissue in the left submandibular space and left anterior cervical triangle extending from angle of mandible to laryngeal cartilage. Contained within confluent infiltrative soft tissue there are several rim enhancing lesions measuring up to 17 mm. Enhancing lesion likely represent residual/recurrent neoplasm. Confluent infiltrative soft tissue may represent infiltrative neoplasm and/or radiation changes. 4. Two rim enhancing lesions anterior to the manubrium measuring up to 21 mm, likely metastasis. 5. Large right pleural effusion, markedly increased from prior CT of chest. Electronically Signed   By: Kristine Garbe M.D.   On: 07/08/2017 19:56   Ct Abdomen Pelvis W Contrast  Result Date: 07/08/2017 CLINICAL DATA:  History of squamous cell carcinoma of the tongue. Weight loss, weakness and hypercalcemia. EXAM: CT ABDOMEN AND PELVIS WITH CONTRAST TECHNIQUE: Multidetector CT imaging of the abdomen and pelvis was performed using the standard protocol following bolus administration of intravenous contrast. CONTRAST:  131mL ISOVUE-300 IOPAMIDOL (ISOVUE-300) INJECTION 61% COMPARISON:  CTA of the chest on 06/24/2017, prior CT of the abdomen and pelvis without contrast on 04/19/2017 and prior CT of the abdomen and pelvis with contrast on 02/02/2014. FINDINGS: Lower chest: There is a large right pleural effusion occupying the visible right lower hemithorax and causing compressive atelectasis of the right lower lung. There is suggestion of diffuse pleural enhancement. There was only a small right pleural effusion present on the chest CTA of 06/24/2017. Contralateral left lung base demonstrates small focus of airspace disease in the lateral left lower lobe and some scarring and atelectasis. Hepatobiliary: Some small hepatic cysts appears stable since prior imaging. The gallbladder has been removed. No evidence of biliary ductal dilatation. Pancreas:  Unremarkable. No pancreatic ductal dilatation or surrounding inflammatory changes. Spleen: Normal in size without focal abnormality. Adrenals/Urinary Tract: Left adrenal enlargement appears stable since prior imaging dating back to 2015. Both kidneys have a normal appearance without evidence of hydronephrosis or focal lesion. No calculi identified. The bladder appears unremarkable. Stomach/Bowel: Gastrostomy tube shows stable positioning in the stomach. There are some fluid-filled small bowel loops without evidence of bowel obstruction or significant ileus. No free air or abscess. 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 This likely is an aneurysm developing at the level of a penetrating ulcer. Reproductive: Prostate is unremarkable. Other: Abdominal wall hernia mesh present without evidence of ventral hernia. No abdominopelvic ascites. Musculoskeletal: No acute or significant osseous findings. IMPRESSION: 1. Development of a large right pleural effusion causing compressive atelectasis of the entire visualized right lower lung and associated with pleural enhancement. Only a small right pleural effusion was present on recent CTA of the chest of 06/24/2017. Pleural metastatic disease is therefore felt to be somewhat unlikely as a cause for rapid accumulation of fluid. This could potentially be infectious in etiology. A full CT of the chest with contrast would be helpful for further evaluation. The fluid is of large enough volume to be amenable to thoracentesis. 2. 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 This has increased very slightly from approximately 1.7-  1.8 cm in 2015. Electronically Signed   By: Aletta Edouard M.D.   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 He has lost weight. 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 Recent CT scan of the chest done 2 weeks ago showed no evidence of pulmonary metastasis. CT scan of the neck and abdomen showed large pleural effusion, worrisome for malignant pleural effusion.  Large right-sided pleural effusion I recommend diagnostic and therapeutic thoracentesis We will consult radiologist for this  Malignant hypercalcemia Visual hallucination and confusion Recommend aggressive IV fluid resuscitation Calcium level is improving. We will add gentle dose of Lasix He is started on calcitonin on 07/08/2017 Do not start him on IV bisphosphonate due to osteonecrosis of the jaw  MRI brain has been ordered  Physical deconditioning He has significant physical deconditioning recently I have try to arrange home PT last week We'll consult physical therapy while hospitalized  Cachexia ([place]) The patient have signs of muscle wasting and have lost more weight He has no appetite Previously, when he was on steroid treatment, he has excellent appetite I recommend he continue taking mirtazapine I will consult with dietitian to consider starting him on continuous nutritional feeding at home We will continue low-dose steroid therapy  Dysphagia He has persistent dysphagia We'll start him on IV fluids We will get the feeding tube replaced by IR  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

## 2017-07-09 NOTE — Telephone Encounter (Signed)
Spoke with NG re 8/3 request to see patient in inf 8/10. NG has since cxd infusion due to patient being in hospital and per NG f/u not needed - patient may not make it.

## 2017-07-09 NOTE — Progress Notes (Signed)
Upon hourly rounding, this RN found pt's PEG tube pulled out. When asked what happened pt states, "I'm not sure, but it was itching." Pt alert and oriented, answering all questions appropriately. However, throughout the night pt was attempting to get out of bed several times and muttering incomprehensible statements. Gauze placed over the site. VSS. Hal Hope, MD made aware of situation. New orders placed for IR consult. Bed alarm in place. Will continue to monitor closely.

## 2017-07-09 NOTE — Progress Notes (Signed)
Attempted to call report 3x to [facility]. Secretary said RN will call back when ready.

## 2017-07-09 NOTE — Progress Notes (Signed)
During shift change pt pulled out his port. VSS. IV team at bedside to reaccess port. Bed alarm on. Mitts placed on pt. Will continue to monitor closely.

## 2017-07-09 NOTE — Progress Notes (Signed)
Report given to RN at [facility]. All questions and concerns addressed. [facility] to transport pt. All belongings and patient specific meds sent with [facility]. Attempted to update wife via telephone. No answer and mailbox is full.

## 2017-07-09 NOTE — Progress Notes (Signed)
Physical Therapy Evaluation Patient Details Name: [name] MRN: [id number] DOB: 06-10-1951 Today's Date: 07/09/2017   History of Present Illness  Pt is a 66 y.o. male with PMHx of squamous cell carcinoma of the throat. Pt presents with rapid weight loss and generalized weakness.  Pt also reports polyuria and appears slightly confused.  Pt went to cancer center on 07/08/17 and was found to have Calcium of 13.6.  Pt was then admitted for treatment of hypercalcemia.  Clinical Impression  Pt admitted with the above conditions. Pt currently with functional limitations due to the deficits listed below. Pt will benefit from skilled PT to increase their independence and safety with mobility to allow discharge. Pt appears confused today. Pt was able to perform bed mobility with min guard but reported feeling light headed. Pt also reported feeling light headed and had a LOB when performing sit to stand transfer.     Follow Up Recommendations Home health PT;Supervision/Assistance - 24 hour    Equipment Recommendations  Rolling walker with 5" wheels (Unable to determine what equipment pt already owns at this time )    Recommendations for Other Services       Precautions / Restrictions Precautions Precautions: Fall Restrictions Weight Bearing Restrictions: No      Mobility  Bed Mobility Overal bed mobility: Needs Assistance Bed Mobility: Supine to Sit     Supine to sit: Min guard     General bed mobility comments: Min guard was provided for safety, Pt able to perform basic bed mobility from supine to sit, pt reports feeling light headed while sitting at EOB   Transfers Overall transfer level: Needs assistance   Transfers: Sit to/from Stand Sit to Stand: Min assist         General transfer comment: Min assist provided to steady trunk upon standing, Pt had LOB and grabbed bed rail upon standing, pt complained of dizziness while standing   Ambulation/Gait             General  Gait Details: Gait was not attempted because pt refused and sat down   Stairs            Wheelchair Mobility    Modified Rankin (Stroke Patients Only)       Balance Overall balance assessment: Needs assistance Sitting-balance support: Feet supported Sitting balance-Leahy Scale: Fair Sitting balance - Comments: Pt was able to sit feet supported at EOB    Standing balance support: Single extremity supported Standing balance-Leahy Scale: Poor Standing balance comment: Pt requires single UE support on bed rail for balance while standing                              Pertinent Vitals/Pain Pain Assessment: 0-10 Pain Score: 9  Pain Location: Abdomen Pain Descriptors / Indicators: Sore;Aching;Grimacing Pain Intervention(s): Limited activity within patient's tolerance;Repositioned;Monitored during session    Home Living Family/patient expects to be discharged to:: Private residence Living Arrangements: Spouse/significant other Available Help at Discharge: Family Type of Home: House Home Access: Stairs to enter Entrance Stairs-Rails: None Entrance Stairs-Number of Steps: 3 stairs Home Layout: One level   Additional Comments: uncertain of accuracy as no family was present and pt was confused during session    Prior Function Level of Independence: Independent         Comments: uncertain of accuracy as no family was present and pt was confused during session     Hand Dominance  Extremity/Trunk Assessment   Upper Extremity Assessment Upper Extremity Assessment: Generalized weakness    Lower Extremity Assessment Lower Extremity Assessment: Generalized weakness       Communication   Communication:  (Pt was confused and was a bad historian )  Cognition Arousal/Alertness: Awake/alert Behavior During Therapy: WFL for tasks assessed/performed Overall Cognitive Status: Impaired/Different from baseline                                  General Comments: Pt was confused and had difficulty finding words and answering questions       General Comments      Exercises     Assessment/Plan    PT Assessment Patient needs continued PT services  PT Problem List Decreased strength;Decreased mobility;Decreased safety awareness;Decreased coordination;Decreased activity tolerance;Decreased balance;Decreased cognition       PT Treatment Interventions Therapeutic activities;Gait training;Therapeutic exercise;Patient/family education;Stair training;Balance training;Functional mobility training    PT Goals (Current goals can be found in the Care Plan section)  Acute Rehab PT Goals Patient Stated Goal: Pt would like to return home  PT Goal Formulation: With patient Time For Goal Achievement: 07/23/17 Potential to Achieve Goals: Good    Frequency Min 3X/week   Barriers to discharge        Co-evaluation               AM-PAC PT "6 Clicks" Daily Activity  Outcome Measure Difficulty turning over in bed (including adjusting bedclothes, sheets and blankets)?: A Little Difficulty moving from lying on back to sitting on the side of the bed? : A Lot Difficulty sitting down on and standing up from a chair with arms (e.g., wheelchair, bedside commode, etc,.)?: Total Help needed moving to and from a bed to chair (including a wheelchair)?: A Lot Help needed walking in hospital room?: A Lot Help needed climbing 3-5 steps with a railing? : A Lot 6 Click Score: 12    End of Session   Activity Tolerance: Patient tolerated treatment well Patient left: in bed;with call bell/phone within reach ( ) Nurse Communication: Mobility status PT Visit Diagnosis: Difficulty in walking, not elsewhere classified (R26.2);Muscle weakness (generalized) (M62.81)    Time: 7741-2878 PT Time Calculation (min) (ACUTE ONLY): 13 min   Charges:   PT Evaluation $PT Eval Low Complexity: 1 Low     PT G CodesOlegario Shearer, SPT   Reino Bellis 07/09/2017, 3:44 PM

## 2017-07-09 NOTE — Procedures (Signed)
  Procedure:   Gastrostomy tube replacement 58F ok to use Preprocedure diagnosis:  G tube malfunction Postprocedure diagnosis:  same EBL:     minimal Complications:   none immediate  See full dictation in [employer].  Dillard Cannon MD Main # [phone] Pager  [phone]

## 2017-07-09 NOTE — Progress Notes (Addendum)
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 He follows with Dr. Alvy Bimler. 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 He had complaints of weight loss, weakness. He was transferred to [facility] for further treatment. Due to disorientation and confusion, MRI brain was completed which showed acute infarction. He was transferred to [facility].   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 13.6 --> 10.9.  -NO bisphosphates due to risk of osteonecrosis of jaw -Trend labs  Right pleural effusion -On room air without acute distress  -IR consulted for thoracentesis   CVA -MRI brain revealed punctate focus right parietal lobe compatible with acute/early subacute infarction  -He has hx of previous CVA/TIA years ago, currently on baby aspirin -Consulted neurology. Transfer to [facility]   Acute metabolic encephalopathy -Continue to monitor, reorient   Hypokalemia -Replace, trend   Hypomagnesemia -Replace, trend   Squamous cell carcinoma of tongue s/p partial left glossectomy -Follows with Dr. Alvy Bimler  Dysphagia  -Accidentally removed PEG overnight -IR consulted for replacement  Severe malnutrition in context of chronic illness -Per dietitian, TF once PEG replaced. 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 This has increased very slightly from  approximately 1.7- 1.8 cm in 2015.   DVT prophylaxis: lovenox Code Status: Full Family Communication: none at bedside. Attempted to contact wife over the phone, but no answer. Voicemail box is full  Disposition Plan: Transfer    Consultants:   Oncology  IR  Procedures:   None   Antimicrobials:  Anti-infectives    None       Subjective: No new complaints today. No pain, continued to be weak. Wondering when he can go home. No complaints of chest pain, SOB, cough. Has some epigastric abdominal pain where he had accidentally removed his PEG overnight. No nausea, vomiting.   Objective: Vitals:   07/08/17 1105 07/08/17 1200 07/08/17 2030 07/09/17 0505  BP: (!) 156/91  (!) 155/96 (!) 167/93  Pulse: 85  87 84  Resp: 18  18 18   Temp: 98.1 F (36.7 C)  98.5 F (36.9 C) 97.6 F (36.4 C)  TempSrc: Axillary  Axillary Oral  SpO2:   92% 93%  Weight:  61 kg (134 lb 7.7 oz)  58 kg (127 lb 13.9 oz)  Height:  5\' 8"  (1.727 m)      Intake/Output Summary (Last 24 hours) at 07/09/17 1112 Last data filed at 07/09/17 0900  Gross per 24 hour  Intake          2488.25 ml  Output             1600 ml  Net           888.25 ml   Filed Weights   07/08/17 1200 07/09/17 0505  Weight: 61 kg (134 lb 7.7 oz) 58 kg (127 lb 13.9 oz)  Examination:  General exam: Appears calm and comfortable, chronically ill appearing  Respiratory system: Clear to auscultation on left, diminished breath sounds on right. Respiratory effort normal.  Cardiovascular system: S1 & S2 heard, RRR. No JVD, murmurs, rubs, gallops or clicks. No pedal edema. Gastrointestinal system: Abdomen is nondistended, soft. No organomegaly or masses felt. Normal bowel sounds heard. Central nervous system: Alert, conversational. Mildly confused.  Extremities: Symmetric  Skin: No rashes, lesions or ulcers on exposed skin   Data Reviewed: I have personally reviewed following labs and imaging studies  CBC:  Recent Labs Lab  07/08/17 0816  WBC 8.2  NEUTROABS 6.7*  HGB 12.3*  HCT 37.6*  MCV 98.7*  PLT 563*   Basic Metabolic Panel:  Recent Labs Lab 07/08/17 0816 07/08/17 1517 07/08/17 2129 07/09/17 0509  NA 132*  --  130* 132*  K 3.9  --  3.9 2.8*  CL  --   --  94* 94*  CO2 30*  --  26 28  GLUCOSE 116  --  105* 110*  BUN 15.6  --  12 12  CREATININE 0.8  --  0.55* 0.54*  CALCIUM 13.6* 11.2* 11.1* 10.9*  MG 1.4* 1.2*  --  1.2*  PHOS  --  2.9  --  3.1   GFR: Estimated Creatinine Clearance: 74.5 mL/min (A) (by C-G formula based on SCr of 0.54 mg/dL (L)). Liver Function Tests:  Recent Labs Lab 07/08/17 0816 07/08/17 2129 07/09/17 0509  AST 50* 45* 29  ALT 38 31 28  ALKPHOS 58 51 48  BILITOT 0.73 1.5* 0.6  PROT 7.3 6.6 6.6  ALBUMIN 2.7* 2.5* 2.6*   No results for input(s): LIPASE, AMYLASE in the last 168 hours. No results for input(s): AMMONIA in the last 168 hours. Coagulation Profile: No results for input(s): INR, PROTIME in the last 168 hours. Cardiac Enzymes: No results for input(s): CKTOTAL, CKMB, CKMBINDEX, TROPONINI in the last 168 hours. BNP (last 3 results) No results for input(s): PROBNP in the last 8760 hours. HbA1C: No results for input(s): HGBA1C in the last 72 hours. CBG:  Recent Labs Lab 07/08/17 1849 07/08/17 2027 07/08/17 2359 07/09/17 0447 07/09/17 0736  GLUCAP 112* 114* 119* 125* 116*   Lipid Profile: No results for input(s): CHOL, HDL, LDLCALC, TRIG, CHOLHDL, LDLDIRECT in the last 72 hours. Thyroid Function Tests:  Recent Labs  07/08/17 2129  TSH 0.482   Anemia Panel: No results for input(s): VITAMINB12, FOLATE, FERRITIN, TIBC, IRON, RETICCTPCT in the last 72 hours. Sepsis Labs: No results for input(s): PROCALCITON, LATICACIDVEN in the last 168 hours.  No results found for this or any previous visit (from the past 240 hour(s)).     Radiology Studies: Ct Soft Tissue Neck W Contrast  Result Date: 07/08/2017 CLINICAL DATA:  66 y/o  M; neck mass  with history of malignancy. EXAM: CT NECK WITH CONTRAST TECHNIQUE: Multidetector CT imaging of the neck was performed using the standard protocol following the bolus administration of intravenous contrast. CONTRAST:  139mL ISOVUE-300 IOPAMIDOL (ISOVUE-300) INJECTION 61% COMPARISON:  06/24/2017 CT chest. 01/14/2017 outside PET-CT. 12/28/2016 CT neck. FINDINGS: Pharynx and larynx: Postsurgical changes within the left aspects of the tongue compatible with history of partial left glossectomy. The region of the oral cavity is motion degraded however no discrete enhancing focus is identified to suggest recurrent or residual disease. Salivary glands: The bilateral submandibular gland resection. Normal parotid glands. Thyroid: 3 mm nodule in the right lobe of thyroid. Lymph nodes: Status post bilateral radical neck  dissection. No recurrent adenopathy in the right cervical chain identified. There is diffuse infiltrative soft tissue in the left submandibular space and anterior cervical triangle extending from the mandibular angle to the level of laryngeal cartilage. There are several discrete rim enhancing irregular foci: 1. Left submandibular, 17 x 13 mm, series 8: Image 50. 2. Left lateral to hyoid, 15 x 9 mm, 8:52. 3. Lateral to thyroid cartilage, 6 mm, 2:61. Additionally, there rim enhancing lesions anterior to the manubrium measuring 11 x 21 mm on the left and 11 x 15 mm on the right (8:105). Vascular: Small caliber left upper internal jugular vein. Calcific atherosclerosis of carotid bifurcations without significant stenosis. Limited intracranial: Negative. Visualized orbits: Negative. Mastoids and visualized paranasal sinuses: Moderate bilateral paranasal sinus mucosal thickening. Skeleton: Mild cervical spondylosis.  No high-grade canal stenosis. Upper chest: Large right pleural effusion. Markedly increased in size from prior CT of chest. Other: None. IMPRESSION: 1. Partial left glossectomy. Oral cavity region is  motion degraded, however, no discrete enhancing focus is identified to suggest recurrent or residual disease. 2. Bilateral radical neck dissection. No recurrent adenopathy or mass in right cervical chain identified. 3. Confluent infiltrative soft tissue in the left submandibular space and left anterior cervical triangle extending from angle of mandible to laryngeal cartilage. Contained within confluent infiltrative soft tissue there are several rim enhancing lesions measuring up to 17 mm. Enhancing lesion likely represent residual/recurrent neoplasm. Confluent infiltrative soft tissue may represent infiltrative neoplasm and/or radiation changes. 4. Two rim enhancing lesions anterior to the manubrium measuring up to 21 mm, likely metastasis. 5. Large right pleural effusion, markedly increased from prior CT of chest. Electronically Signed   By: Kristine Garbe M.D.   On: 07/08/2017 19:56   Ct Abdomen Pelvis W Contrast  Result Date: 07/08/2017 CLINICAL DATA:  History of squamous cell carcinoma of the tongue. Weight loss, weakness and hypercalcemia. EXAM: CT ABDOMEN AND PELVIS WITH CONTRAST TECHNIQUE: Multidetector CT imaging of the abdomen and pelvis was performed using the standard protocol following bolus administration of intravenous contrast. CONTRAST:  155mL ISOVUE-300 IOPAMIDOL (ISOVUE-300) INJECTION 61% COMPARISON:  CTA of the chest on 06/24/2017, prior CT of the abdomen and pelvis without contrast on 04/19/2017 and prior CT of the abdomen and pelvis with contrast on 02/02/2014. FINDINGS: Lower chest: There is a large right pleural effusion occupying the visible right lower hemithorax and causing compressive atelectasis of the right lower lung. There is suggestion of diffuse pleural enhancement. There was only a small right pleural effusion present on the chest CTA of 06/24/2017. Contralateral left lung base demonstrates small focus of airspace disease in the lateral left lower lobe and some scarring  and atelectasis. Hepatobiliary: Some small hepatic cysts appears stable since prior imaging. The gallbladder has been removed. No evidence of biliary ductal dilatation. Pancreas: Unremarkable. No pancreatic ductal dilatation or surrounding inflammatory changes. Spleen: Normal in size without focal abnormality. Adrenals/Urinary Tract: Left adrenal enlargement appears stable since prior imaging dating back to 2015. Both kidneys have a normal appearance without evidence of hydronephrosis or focal lesion. No calculi identified. The bladder appears unremarkable. Stomach/Bowel: Gastrostomy tube shows stable positioning in the stomach. There are some fluid-filled small bowel loops without evidence of bowel obstruction or significant ileus. No free air or abscess. 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 This likely is an aneurysm developing at the level of a penetrating ulcer. Reproductive: Prostate is unremarkable. Other:  Abdominal wall hernia mesh present without evidence of ventral hernia. No abdominopelvic ascites. Musculoskeletal: No acute or significant osseous findings. IMPRESSION: 1. Development of a large right pleural effusion causing compressive atelectasis of the entire visualized right lower lung and associated with pleural enhancement. Only a small right pleural effusion was present on recent CTA of the chest of 06/24/2017. Pleural metastatic disease is therefore felt to be somewhat unlikely as a cause for rapid accumulation of fluid. This could potentially be infectious in etiology. A full CT of the chest with contrast would be helpful for further evaluation. The fluid is of large enough volume to be amenable to thoracentesis. 2. 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 This has increased very slightly from approximately  1.7- 1.8 cm in 2015. Electronically Signed   By: Aletta Edouard M.D.   On: 07/08/2017 21:14      Scheduled Meds: . aspirin  81 mg Oral q morning - 10a  . calcitonin (salmon)  1 spray Alternating Nares Daily  . dexamethasone  4 mg Intravenous Q12H  . feeding supplement (ENSURE ENLIVE)  237 mL Oral BID BM  . feeding supplement (OSMOLITE 1.5 CAL)  1,000 mL Per Tube Q24H  . insulin aspart  0-5 Units Subcutaneous QHS  . insulin aspart  0-9 Units Subcutaneous TID WC  . lidocaine      . mirtazapine  15 mg Oral QHS  . pneumococcal 23 valent vaccine  0.5 mL Intramuscular Tomorrow-1000  . potassium chloride  40 mEq Oral Q4H   Continuous Infusions: . sodium chloride 125 mL/hr at 07/08/17 1209  . magnesium sulfate 1 - 4 g bolus IVPB       LOS: 1 day    Time spent: 40 minutes   Dessa Phi, DO [facility] [email or web address] Password [email or web address] 07/09/2017, 11:12 AM

## 2017-07-09 NOTE — Progress Notes (Signed)
PT Cancellation Note  Patient Details Name: [name] MRN: [id number] DOB: [birth year]   Cancelled Treatment:    Reason Eval/Treat Not Completed: Patient at procedure or test/unavailable (IR)   Jamey Demchak,KATHrine E 07/09/2017, 10:38 AM Carmelia Bake, PT, DPT 07/09/2017 Pager: [phone]

## 2017-07-09 NOTE — Procedures (Signed)
  Procedure:   [place] RIGHT thoracentesis 1L amber fluid Preprocedure diagnosis:  effusion Postprocedure diagnosis:  same EBL:     minimal Complications:   none immediate  See full dictation in [facility].  Dillard Cannon MD Main # [phone] Pager  [phone]

## 2017-07-10 ENCOUNTER — Ambulatory Visit: Payer: Medicare PPO

## 2017-07-10 ENCOUNTER — Encounter: Payer: Self-pay | Admitting: Nutrition

## 2017-07-10 DIAGNOSIS — Z7189 Other specified counseling: Secondary | ICD-10-CM

## 2017-07-10 DIAGNOSIS — R9402 Abnormal brain scan: Secondary | ICD-10-CM

## 2017-07-10 DIAGNOSIS — I63411 Cerebral infarction due to embolism of right middle cerebral artery: Secondary | ICD-10-CM

## 2017-07-10 DIAGNOSIS — R443 Hallucinations, unspecified: Secondary | ICD-10-CM

## 2017-07-10 DIAGNOSIS — K9423 Gastrostomy malfunction: Secondary | ICD-10-CM

## 2017-07-10 LAB — COMPREHENSIVE METABOLIC PANEL
ALBUMIN: 2.4 g/dL — AB (ref 3.5–5.0)
ALK PHOS: 47 U/L (ref 38–126)
ALT: 24 U/L (ref 17–63)
AST: 23 U/L (ref 15–41)
Anion gap: 10 (ref 5–15)
BILIRUBIN TOTAL: 0.6 mg/dL (ref 0.3–1.2)
BUN: 10 mg/dL (ref 6–20)
CALCIUM: 11.4 mg/dL — AB (ref 8.9–10.3)
CO2: 27 mmol/L (ref 22–32)
Chloride: 94 mmol/L — ABNORMAL LOW (ref 101–111)
Creatinine, Ser: 0.58 mg/dL — ABNORMAL LOW (ref 0.61–1.24)
GFR calc Af Amer: >60 mL/min (ref >60)
GFR calc non Af Amer: >60 mL/min (ref >60)
GLUCOSE: 117 mg/dL — AB (ref 65–99)
POTASSIUM: 3 mmol/L — AB (ref 3.5–5.1)
SODIUM: 131 mmol/L — AB (ref 135–145)
TOTAL PROTEIN: 6.1 g/dL — AB (ref 6.5–8.1)

## 2017-07-10 LAB — LACTATE DEHYDROGENASE, PLEURAL OR PERITONEAL FLUID: LD FL: 501 U/L — AB (ref 3–23)

## 2017-07-10 LAB — GLUCOSE, CAPILLARY
GLUCOSE-CAPILLARY: 132 mg/dL — AB (ref 65–99)
GLUCOSE-CAPILLARY: 129 mg/dL — AB (ref 65–99)
GLUCOSE-CAPILLARY: 132 mg/dL — AB (ref 65–99)
Glucose-Capillary: 107 mg/dL — ABNORMAL HIGH (ref 65–99)
Glucose-Capillary: 113 mg/dL — ABNORMAL HIGH (ref 65–99)
Glucose-Capillary: 115 mg/dL — ABNORMAL HIGH (ref 65–99)
Glucose-Capillary: 121 mg/dL — ABNORMAL HIGH (ref 65–99)

## 2017-07-10 LAB — LIPID PANEL
CHOL/HDL RATIO: 3.1 ratio
Cholesterol: 137 mg/dL (ref 0–200)
HDL: 44 mg/dL (ref >40)
LDL Cholesterol: 66 mg/dL (ref 0–99)
TRIGLYCERIDES: 137 mg/dL (ref <150)
VLDL: 27 mg/dL (ref 0–40)

## 2017-07-10 LAB — PROTEIN, PLEURAL OR PERITONEAL FLUID: Total protein, fluid: 4.1 g/dL

## 2017-07-10 LAB — PHOSPHORUS: Phosphorus: 2.7 mg/dL (ref 2.5–4.6)

## 2017-07-10 LAB — BODY FLUID CELL COUNT WITH DIFFERENTIAL
Eos, Fluid: 12 %
LYMPHS FL: 21 %
MONOCYTE-MACROPHAGE-SEROUS FLUID: 19 % — AB (ref 50–90)
Neutrophil Count, Fluid: 48 % — ABNORMAL HIGH (ref 0–25)
Total Nucleated Cell Count, Fluid: 101 cu mm (ref 0–1000)

## 2017-07-10 LAB — LACTATE DEHYDROGENASE: LDH: 145 U/L (ref 98–192)

## 2017-07-10 LAB — MAGNESIUM: Magnesium: 1.3 mg/dL — ABNORMAL LOW (ref 1.7–2.4)

## 2017-07-10 MED ORDER — IOPAMIDOL (ISOVUE-370) INJECTION 76%
INTRAVENOUS | Status: AC
  Administered 2017-07-11: 50 mL
  Filled 2017-07-10: qty 50

## 2017-07-10 MED ORDER — FUROSEMIDE 10 MG/ML IJ SOLN
20.0000 mg | Freq: Once | INTRAMUSCULAR | Status: AC
  Administered 2017-07-10: 20 mg via INTRAVENOUS
  Filled 2017-07-10: qty 2

## 2017-07-10 MED ORDER — ASPIRIN 81 MG PO CHEW
324.0000 mg | CHEWABLE_TABLET | Freq: Every morning | ORAL | Status: DC
  Administered 2017-07-10 – 2017-07-12 (×3): 324 mg via ORAL
  Filled 2017-07-10 (×3): qty 4

## 2017-07-10 NOTE — Progress Notes (Addendum)
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 He follows with Dr. Alvy Bimler. 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 He had complaints of weight loss, weakness. He was transferred to [facility] for further treatment, also found to have large R pleural effusion, due to ongoing disorientation and confusion, MRI brain was completed which showed acute infarction. He was transferred to [facility] for eval by stroke team.  Assessment & Plan:   Hypercalcemia -Patient given IVF, calcitonin with improvement in calcium levels. 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 -Ca needs to be corrected for low albumin, will check Ical -s/p adequate hydration, IV lasix now  Large Right pleural effusion -was not symptomatic, no s/s of infection, mets always a possibility -underwent Thoracentesis by IR yesterday, 1L of amber fluid drained -unable to find pleural fluid labs-contacted lab and requested to add on-cell count/diff, culture, protein, LDH,  Only fluid cytology in path now, results should be out this afternoon per staff -repeat CXR tomorrow Addendum: lab cannot do culture since it was put in a non sterile container yesterday  ACute CVA -MRI brain 8/7 revealed punctate focus right parietal lobe compatible with acute/early subacute infarction  -He has hx of previous CVA/TIA years ago, currently on baby aspirin, changed to 325mg  -Consulted neurology -Fu ECHo/caroptid duplex -Pt/OT eval  Acute metabolic encephalopathy -due to hyerpcalcemia most likely -Tx and less likely that punctate CVA contributing  Hypokalemia -Replace, trend   Hypomagnesemia -Replace, trend   Squamous cell carcinoma of tongue s/p partial left  glossectomy -Follows with Dr. Alvy Bimler -s/p PEG  Dysphagia  -Accidentally removed PEG overnight -IR consulted for replacement  Severe malnutrition in context of chronic illness -Per dietitian, TF once PEG replaced. 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 This has increased very slightly from approximately 1.7- 1.8 cm in 2015.  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 per Dr.Gorsuch  DVT prophylaxis: lovenox Code Status: Full Family Communication:  No family at bedside Disposition Plan: Home pending improvement in mentation, Ca, CvA workup etc   Consultants:   Oncology  IR  Procedures:   None   Antimicrobials:  Anti-infectives    None       Subjective: Reports being tired, some dyspnea  Objective: Vitals:   07/09/17 1229 07/09/17 2021 07/10/17 0040 07/10/17 1306  BP: (!) 145/85 (!) 154/84 (!) 160/82 124/80  Pulse: 86 89 82 92  Resp: 16 18 18  (!) 24  Temp: 98 F (36.7 C) 97.8 F (36.6 C) 97.8 F (36.6 C) 99.1 F (37.3 C)  TempSrc: Oral Oral Oral Oral  SpO2: 93% 93% 95% (!) 89%  Weight:      Height:        Intake/Output Summary (Last 24 hours) at 07/10/17 1317 Last data filed at 07/10/17 1012  Gross per 24 hour  Intake             2480 ml  Output             1200 ml  Net  1280 ml   Filed Weights   07/08/17 1200 07/09/17 0505  Weight: 61 kg (134 lb 7.7 oz) 58 kg (127 lb 13.9 oz)    Examination:  Gen: Awake, Alert, frail, chronically ill male, laying in bed, uncomfortable  HEENT: PERRLA, Neck supple, no JVD Lungs: decreased BS on Right CVS: RRR,No Gallops,Rubs or new Murmurs Abd: soft, Non tender, non distended, BS present, PEg tube Extremities: trace edena Skin: no new rashes  Data Reviewed: I have personally reviewed following labs and imaging studies  CBC:  Recent Labs Lab 07/08/17 0816   WBC 8.2  NEUTROABS 6.7*  HGB 12.3*  HCT 37.6*  MCV 98.7*  PLT 466*   Basic Metabolic Panel:  Recent Labs Lab 07/08/17 0816 07/08/17 1517 07/08/17 2129 07/09/17 0509 07/10/17 0803  NA 132*  --  130* 132* 131*  K 3.9  --  3.9 2.8* 3.0*  CL  --   --  94* 94* 94*  CO2 30*  --  26 28 27   GLUCOSE 116  --  105* 110* 117*  BUN 15.6  --  12 12 10   CREATININE 0.8  --  0.55* 0.54* 0.58*  CALCIUM 13.6* 11.2* 11.1* 10.9* 11.4*  MG 1.4* 1.2*  --  1.2* 1.3*  PHOS  --  2.9  --  3.1 2.7   GFR: Estimated Creatinine Clearance: 74.5 mL/min (A) (by C-G formula based on SCr of 0.58 mg/dL (L)). Liver Function Tests:  Recent Labs Lab 07/08/17 0816 07/08/17 2129 07/09/17 0509 07/10/17 0803  AST 50* 45* 29 23  ALT 38 31 28 24   ALKPHOS 58 51 48 47  BILITOT 0.73 1.5* 0.6 0.6  PROT 7.3 6.6 6.6 6.1*  ALBUMIN 2.7* 2.5* 2.6* 2.4*   No results for input(s): LIPASE, AMYLASE in the last 168 hours. No results for input(s): AMMONIA in the last 168 hours. Coagulation Profile: No results for input(s): INR, PROTIME in the last 168 hours. Cardiac Enzymes: No results for input(s): CKTOTAL, CKMB, CKMBINDEX, TROPONINI in the last 168 hours. BNP (last 3 results) No results for input(s): PROBNP in the last 8760 hours. HbA1C: No results for input(s): HGBA1C in the last 72 hours. CBG:  Recent Labs Lab 07/09/17 2013 07/10/17 0231 07/10/17 0449 07/10/17 0811 07/10/17 1210  GLUCAP 106* 107* 113* 115* 132*   Lipid Profile: No results for input(s): CHOL, HDL, LDLCALC, TRIG, CHOLHDL, LDLDIRECT in the last 72 hours. Thyroid Function Tests:  Recent Labs  07/08/17 2129  TSH 0.482   Anemia Panel: No results for input(s): VITAMINB12, FOLATE, FERRITIN, TIBC, IRON, RETICCTPCT in the last 72 hours. Sepsis Labs: No results for input(s): PROCALCITON, LATICACIDVEN in the last 168 hours.  No results found for this or any previous visit (from the past 240 hour(s)).     Radiology Studies: Dg Chest  1 View  Result Date: 07/09/2017 CLINICAL DATA:  Post right thoracentesis EXAM: CHEST 1 VIEW COMPARISON:  06/24/2017 FINDINGS: Moderate right pleural effusion, increased in size from the prior study. No pneumothorax. Compressive atelectasis right lower lobe. Port-A-Cath tip SVC.  Left lung clear. IMPRESSION: No pneumothorax post right thoracentesis. Moderately large right pleural effusion has increased in size significantly since 06/24/2017 Electronically Signed   By: Franchot Gallo M.D.   On: 07/09/2017 12:40   Ct Soft Tissue Neck W Contrast  Result Date: 07/08/2017 CLINICAL DATA:  66 y/o  M; neck mass with history of malignancy. EXAM: CT NECK WITH CONTRAST TECHNIQUE: Multidetector CT imaging of the neck was performed using the  standard protocol following the bolus administration of intravenous contrast. CONTRAST:  146mL ISOVUE-300 IOPAMIDOL (ISOVUE-300) INJECTION 61% COMPARISON:  06/24/2017 CT chest. 01/14/2017 outside PET-CT. 12/28/2016 CT neck. FINDINGS: Pharynx and larynx: Postsurgical changes within the left aspects of the tongue compatible with history of partial left glossectomy. The region of the oral cavity is motion degraded however no discrete enhancing focus is identified to suggest recurrent or residual disease. Salivary glands: The bilateral submandibular gland resection. Normal parotid glands. Thyroid: 3 mm nodule in the right lobe of thyroid. Lymph nodes: Status post bilateral radical neck dissection. No recurrent adenopathy in the right cervical chain identified. There is diffuse infiltrative soft tissue in the left submandibular space and anterior cervical triangle extending from the mandibular angle to the level of laryngeal cartilage. There are several discrete rim enhancing irregular foci: 1. Left submandibular, 17 x 13 mm, series 8: Image 50. 2. Left lateral to hyoid, 15 x 9 mm, 8:52. 3. Lateral to thyroid cartilage, 6 mm, 2:61. Additionally, there rim enhancing lesions anterior to the  manubrium measuring 11 x 21 mm on the left and 11 x 15 mm on the right (8:105). Vascular: Small caliber left upper internal jugular vein. Calcific atherosclerosis of carotid bifurcations without significant stenosis. Limited intracranial: Negative. Visualized orbits: Negative. Mastoids and visualized paranasal sinuses: Moderate bilateral paranasal sinus mucosal thickening. Skeleton: Mild cervical spondylosis.  No high-grade canal stenosis. Upper chest: Large right pleural effusion. Markedly increased in size from prior CT of chest. Other: None. IMPRESSION: 1. Partial left glossectomy. Oral cavity region is motion degraded, however, no discrete enhancing focus is identified to suggest recurrent or residual disease. 2. Bilateral radical neck dissection. No recurrent adenopathy or mass in right cervical chain identified. 3. Confluent infiltrative soft tissue in the left submandibular space and left anterior cervical triangle extending from angle of mandible to laryngeal cartilage. Contained within confluent infiltrative soft tissue there are several rim enhancing lesions measuring up to 17 mm. Enhancing lesion likely represent residual/recurrent neoplasm. Confluent infiltrative soft tissue may represent infiltrative neoplasm and/or radiation changes. 4. Two rim enhancing lesions anterior to the manubrium measuring up to 21 mm, likely metastasis. 5. Large right pleural effusion, markedly increased from prior CT of chest. Electronically Signed   By: Kristine Garbe M.D.   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 EXAM: MRI HEAD WITHOUT CONTRAST TECHNIQUE: Multiplanar, multiecho pulse sequences of the brain and surrounding structures were obtained without intravenous contrast. COMPARISON:  06/22/2017 CT of the head.  05/07/2008 MRI of the head. FINDINGS: Brain: Punctate  focus of reduced diffusion in the right parietal lobe (series 3, image 38). Several nonspecific foci of T2 FLAIR hyperintense signal abnormality in subcortical and periventricular white matter is compatible with mild chronic microvascular ischemic changes for age. Mild brain parenchymal volume loss. Vascular: Normal flow voids. Skull and upper cervical spine: Normal marrow signal. Sinuses/Orbits: Moderate maxillary sinus mucosal thickening. No abnormal signal of mastoid air cells. Normal orbits. Other: None. IMPRESSION: 1. Punctate focus of reduced diffusion in right parietal lobe compatible with acute/early subacute infarction. No acute hemorrhage. 2. Stable chronic microvascular ischemic changes and parenchymal volume loss of the brain. 3. Moderate maxillary sinus paranasal sinus disease. These results will be called to the ordering clinician or representative by the Radiologist Assistant, and communication documented in the PACS or zVision Dashboard. Electronically Signed  By: Kristine Garbe M.D.   On: 07/09/2017 16:20   Ct Abdomen Pelvis W Contrast  Result Date: 07/08/2017 CLINICAL DATA:  History of squamous cell carcinoma of the tongue. Weight loss, weakness and hypercalcemia. EXAM: CT ABDOMEN AND PELVIS WITH CONTRAST TECHNIQUE: Multidetector CT imaging of the abdomen and pelvis was performed using the standard protocol following bolus administration of intravenous contrast. CONTRAST:  11mL ISOVUE-300 IOPAMIDOL (ISOVUE-300) INJECTION 61% COMPARISON:  CTA of the chest on 06/24/2017, prior CT of the abdomen and pelvis without contrast on 04/19/2017 and prior CT of the abdomen and pelvis with contrast on 02/02/2014. FINDINGS: Lower chest: There is a large right pleural effusion occupying the visible right lower hemithorax and causing compressive atelectasis of the right lower lung. There is suggestion of diffuse pleural enhancement. There was only a small right pleural effusion present on the chest  CTA of 06/24/2017. Contralateral left lung base demonstrates small focus of airspace disease in the lateral left lower lobe and some scarring and atelectasis. Hepatobiliary: Some small hepatic cysts appears stable since prior imaging. The gallbladder has been removed. No evidence of biliary ductal dilatation. Pancreas: Unremarkable. No pancreatic ductal dilatation or surrounding inflammatory changes. Spleen: Normal in size without focal abnormality. Adrenals/Urinary Tract: Left adrenal enlargement appears stable since prior imaging dating back to 2015. Both kidneys have a normal appearance without evidence of hydronephrosis or focal lesion. No calculi identified. The bladder appears unremarkable. Stomach/Bowel: Gastrostomy tube shows stable positioning in the stomach. There are some fluid-filled small bowel loops without evidence of bowel obstruction or significant ileus. No free air or abscess. 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 This likely is an aneurysm developing at the level of a penetrating ulcer. Reproductive: Prostate is unremarkable. Other: Abdominal wall hernia mesh present without evidence of ventral hernia. No abdominopelvic ascites. Musculoskeletal: No acute or significant osseous findings. IMPRESSION: 1. Development of a large right pleural effusion causing compressive atelectasis of the entire visualized right lower lung and associated with pleural enhancement. Only a small right pleural effusion was present on recent CTA of the chest of 06/24/2017. Pleural metastatic disease is therefore felt to be somewhat unlikely as a cause for rapid accumulation of fluid. This could potentially be infectious in etiology. A full CT of the chest with contrast would be helpful for further evaluation. The fluid is of large enough volume to be amenable to thoracentesis. 2. Proximal right  common iliac artery saccular aneurysm again noted measuring approximately 2 cm in diameter. This has increased very slightly from approximately 1.7- 1.8 cm in 2015. Electronically Signed   By: Aletta Edouard M.D.   On: 07/08/2017 21:14   Ir Replc Gastro/colonic Tube Percut W/fluoro  Result Date: 07/09/2017 CLINICAL DATA:  Tongue carcinoma. Inadvertent removal of the percutaneous gastrostomy catheter, initially placed 03/01/2017. Continued need for enteral feeding support. EXAM: GASTROSTOMY CATHETER REPLACEMENT FLUOROSCOPY TIME:  0.3 minutes, 81  uGym2 DAP TECHNIQUE: The procedure, risks, benefits, and alternatives were explained to the patient. Questions regarding the procedure were encouraged and answered. The patient understands and consents to the procedure. The external portion of the gastrostomy tube tract had constricted somewhat post catheter dislodgement such that bedside replacement was not feasible. The site was prepped and draped in usual sterile fashion. Under fluoroscopic guidance, an angled 5 [place] Kumpe catheter was advanced through the tract into the gastric lumen, position confirmed with a small contrast injection under fluoroscopy. Catheter  exchanged over short Amplatz wire for a vascular dilator which facilitated advancement of 48 French balloon retention gastrostomy catheter. The balloon was inflated with 7 mL sterile saline. The patient tolerated the procedure well. Small contrast injection confirms appropriate positioning. The external bumper was applied and the catheter was flushed. COMPLICATIONS: COMPLICATIONS none IMPRESSION: 1. Technically successful replacement of 14 French balloon retention gastrostomy catheter under fluoroscopy. Electronically Signed   By: Lucrezia Europe M.D.   On: 07/09/2017 11:50   Ir Thoracentesis Asp Pleural Space W/img Guide  Result Date: 07/09/2017 CLINICAL DATA:  Tongue carcinoma.  Large right pleural effusion. EXAM: EXAM THORACENTESIS WITH ULTRASOUND  TECHNIQUE: The procedure, risks (including but not limited to bleeding, infection, organ damage ), benefits, and alternatives were explained to the patient. Questions regarding the procedure were encouraged and answered. The patient understands and consents to the procedure. Survey ultrasound of the right hemithorax was performed and an appropriate skin entry site was localized. Site was marked, prepped with chlorhexidine, draped in usual sterile fashion, infiltrated locally with 1% lidocaine. The 7 cm Yueh sheath needle was advanced into the pleural space. Amber pleural fluid returned. 1 L was removed. Sample sent for the requested laboratory studies. The patient tolerated procedure well. COMPLICATIONS: COMPLICATIONS None immediate IMPRESSION: Technically successful ultrasound-guided right thoracentesis. Follow-up chest radiograph pending. Electronically Signed   By: Lucrezia Europe M.D.   On: 07/09/2017 11:53      Scheduled Meds: . aspirin  324 mg Oral q morning - 10a  . calcitonin (salmon)  1 spray Alternating Nares Daily  . dexamethasone  4 mg Intravenous Q12H  . feeding supplement (ENSURE ENLIVE)  237 mL Oral BID BM  . feeding supplement (OSMOLITE 1.5 CAL)  1,000 mL Per Tube Q24H  . insulin aspart  0-5 Units Subcutaneous QHS  . insulin aspart  0-9 Units Subcutaneous TID WC  . mirtazapine  15 mg Oral QHS   Continuous Infusions:    LOS: 2 days    Time spent: 35 minutes   Domenic Polite, MD [facility] Page via [email or web address] Password [email or web address] 07/10/2017, 1:17 PM
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
## 2017-07-10 NOTE — Progress Notes (Signed)
STROKE TEAM PROGRESS NOTE   HISTORY OF PRESENT ILLNESS (per record)   [name] is a 66 y.o. 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 He was transferred to [facility] long for further evaluation. He was disoriented on exam. An MRI of the brain was done for his altered mental status, which showed a punctate area of restricted diffusion in the right parietal lobe. For that reason, he was transferred to [facility]. Patient was very sleepy when I examined him around 1:15 AM. He followed most commands.  Patient was not administered IV t-PA secondary to out of window.    LKW: At least 24 hours to 48 hours from the time of exam tpa given?: no, way outside the window, also presented with nonspecific encephalopathic presentation Premorbid modified Rankin scale (mRS): 0   SUBJECTIVE (INTERVAL HISTORY)  No acute events overnight, He has dysarthria at baseline following tongue surgery for cancer  OBJECTIVE Temp:  [97.8 F (36.6 C)] 97.8 F (36.6 C) (08/08 0040) Pulse Rate:  [82-89] 82 (08/08 0040) Cardiac Rhythm: Heart block (08/08 0700) Resp:  [18] 18 (08/08 0040) BP: (154-160)/(82-84) 160/82 (08/08 0040) SpO2:  [93 %-95 %] 95 % (08/08 0040)  CBC:  Recent Labs Lab 07/08/17 0816  WBC 8.2  NEUTROABS 6.7*  HGB 12.3*  HCT 37.6*  MCV 98.7*  PLT 414*    Basic Metabolic Panel:  Recent Labs Lab 07/09/17 0509 07/10/17 0803  NA 132* 131*  K 2.8* 3.0*  CL 94* 94*  CO2 28 27  GLUCOSE 110* 117*  BUN 12 10  CREATININE 0.54* 0.58*  CALCIUM 10.9* 11.4*  MG 1.2* 1.3*  PHOS 3.1 2.7    Lipid Panel:    Component Value Date/Time   CHOL 170 07/29/2014 1402    TRIG 144 07/29/2014 1402   HDL 51 07/29/2014 1402   CHOLHDL 3.3 07/29/2014 1402   VLDL 29 07/29/2014 1402   LDLCALC 90 07/29/2014 1402   HgbA1c:  Lab Results  Component Value Date   HGBA1C 5.7 (H) 02/12/2013   Urine Drug Screen:    Component Value Date/Time   LABOPIA NONE DETECTED 02/12/2013 2231   COCAINSCRNUR NONE DETECTED 02/12/2013 2231   LABBENZ NONE DETECTED 02/12/2013 2231   AMPHETMU NONE DETECTED 02/12/2013 2231   THCU NONE DETECTED 02/12/2013 2231   LABBARB NONE DETECTED 02/12/2013 2231    Alcohol Level     Component Value Date/Time   ETH <10 01/06/2015 0905    IMAGING  Dg Chest 1 View  Result Date: 07/09/2017 CLINICAL DATA:  Post right thoracentesis EXAM: CHEST 1 VIEW COMPARISON:  06/24/2017 FINDINGS: Moderate right pleural effusion, increased in size from the prior study. No pneumothorax. Compressive atelectasis right lower lobe. Port-A-Cath tip SVC.  Left lung clear. IMPRESSION: No pneumothorax post right thoracentesis. Moderately large right pleural effusion has increased in size significantly since 06/24/2017 Electronically Signed   By: Franchot Gallo M.D.   On: 07/09/2017 12:40   Ct Soft Tissue Neck W Contrast  Result Date: 07/08/2017 CLINICAL DATA:  66 y/o  M; neck mass with history of malignancy. EXAM: CT NECK WITH CONTRAST TECHNIQUE: Multidetector CT imaging of the neck was performed using the standard protocol following the bolus administration of intravenous  contrast. CONTRAST:  128mL ISOVUE-300 IOPAMIDOL (ISOVUE-300) INJECTION 61% COMPARISON:  06/24/2017 CT chest. 01/14/2017 outside PET-CT. 12/28/2016 CT neck. FINDINGS: Pharynx and larynx: Postsurgical changes within the left aspects of the tongue compatible with history of partial left glossectomy. The region of the oral cavity is motion degraded however no discrete enhancing focus is identified to suggest recurrent or residual disease. Salivary glands: The bilateral submandibular gland resection. Normal  parotid glands. Thyroid: 3 mm nodule in the right lobe of thyroid. Lymph nodes: Status post bilateral radical neck dissection. No recurrent adenopathy in the right cervical chain identified. There is diffuse infiltrative soft tissue in the left submandibular space and anterior cervical triangle extending from the mandibular angle to the level of laryngeal cartilage. There are several discrete rim enhancing irregular foci: 1. Left submandibular, 17 x 13 mm, series 8: Image 50. 2. Left lateral to hyoid, 15 x 9 mm, 8:52. 3. Lateral to thyroid cartilage, 6 mm, 2:61. Additionally, there rim enhancing lesions anterior to the manubrium measuring 11 x 21 mm on the left and 11 x 15 mm on the right (8:105). Vascular: Small caliber left upper internal jugular vein. Calcific atherosclerosis of carotid bifurcations without significant stenosis. Limited intracranial: Negative. Visualized orbits: Negative. Mastoids and visualized paranasal sinuses: Moderate bilateral paranasal sinus mucosal thickening. Skeleton: Mild cervical spondylosis.  No high-grade canal stenosis. Upper chest: Large right pleural effusion. Markedly increased in size from prior CT of chest. Other: None. IMPRESSION: 1. Partial left glossectomy. Oral cavity region is motion degraded, however, no discrete enhancing focus is identified to suggest recurrent or residual disease. 2. Bilateral radical neck dissection. No recurrent adenopathy or mass in right cervical chain identified. 3. Confluent infiltrative soft tissue in the left submandibular space and left anterior cervical triangle extending from angle of mandible to laryngeal cartilage. Contained within confluent infiltrative soft tissue there are several rim enhancing lesions measuring up to 17 mm. Enhancing lesion likely represent residual/recurrent neoplasm. Confluent infiltrative soft tissue may represent infiltrative neoplasm and/or radiation changes. 4. Two rim enhancing lesions anterior to the manubrium  measuring up to 21 mm, likely metastasis. 5. Large right pleural effusion, markedly increased from prior CT of chest. Electronically Signed   By: Kristine Garbe M.D.   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 EXAM: MRI HEAD WITHOUT CONTRAST TECHNIQUE: Multiplanar, multiecho pulse sequences of the brain and surrounding structures were obtained without intravenous contrast. COMPARISON:  06/22/2017 CT of the head.  05/07/2008 MRI of the head. FINDINGS: Brain: Punctate focus of reduced diffusion in the right parietal lobe (series 3, image 38). Several nonspecific foci of T2 FLAIR hyperintense signal abnormality in subcortical and periventricular white matter is compatible with mild chronic microvascular ischemic changes for age. Mild brain parenchymal volume loss. Vascular: Normal flow voids. Skull and upper cervical spine: Normal marrow signal. Sinuses/Orbits: Moderate maxillary sinus mucosal thickening. No abnormal signal of mastoid air cells. Normal orbits. Other: None. IMPRESSION: 1. Punctate focus of reduced diffusion in right parietal lobe compatible with acute/early subacute infarction. No acute hemorrhage. 2. Stable chronic microvascular ischemic changes and parenchymal volume loss of the brain. 3. Moderate maxillary sinus paranasal sinus disease. These results will be called to the ordering clinician or representative by the Radiologist Assistant, and communication documented in the PACS or zVision Dashboard. Electronically Signed   By: Kristine Garbe M.D.   On:  07/09/2017 16:20   Ct Abdomen Pelvis W Contrast  Result Date: 07/08/2017 CLINICAL DATA:  History of squamous cell carcinoma of the tongue. Weight loss, weakness and hypercalcemia. EXAM: CT ABDOMEN AND PELVIS WITH CONTRAST TECHNIQUE: Multidetector CT imaging of the abdomen and pelvis was  performed using the standard protocol following bolus administration of intravenous contrast. CONTRAST:  153mL ISOVUE-300 IOPAMIDOL (ISOVUE-300) INJECTION 61% COMPARISON:  CTA of the chest on 06/24/2017, prior CT of the abdomen and pelvis without contrast on 04/19/2017 and prior CT of the abdomen and pelvis with contrast on 02/02/2014. FINDINGS: Lower chest: There is a large right pleural effusion occupying the visible right lower hemithorax and causing compressive atelectasis of the right lower lung. There is suggestion of diffuse pleural enhancement. There was only a small right pleural effusion present on the chest CTA of 06/24/2017. Contralateral left lung base demonstrates small focus of airspace disease in the lateral left lower lobe and some scarring and atelectasis. Hepatobiliary: Some small hepatic cysts appears stable since prior imaging. The gallbladder has been removed. No evidence of biliary ductal dilatation. Pancreas: Unremarkable. No pancreatic ductal dilatation or surrounding inflammatory changes. Spleen: Normal in size without focal abnormality. Adrenals/Urinary Tract: Left adrenal enlargement appears stable since prior imaging dating back to 2015. Both kidneys have a normal appearance without evidence of hydronephrosis or focal lesion. No calculi identified. The bladder appears unremarkable. Stomach/Bowel: Gastrostomy tube shows stable positioning in the stomach. There are some fluid-filled small bowel loops without evidence of bowel obstruction or significant ileus. No free air or abscess. 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 This likely is an aneurysm developing at the level of a penetrating ulcer. Reproductive: Prostate is unremarkable. Other: Abdominal wall hernia mesh present without evidence of ventral hernia. No abdominopelvic ascites. Musculoskeletal: No acute or  significant osseous findings. IMPRESSION: 1. Development of a large right pleural effusion causing compressive atelectasis of the entire visualized right lower lung and associated with pleural enhancement. Only a small right pleural effusion was present on recent CTA of the chest of 06/24/2017. Pleural metastatic disease is therefore felt to be somewhat unlikely as a cause for rapid accumulation of fluid. This could potentially be infectious in etiology. A full CT of the chest with contrast would be helpful for further evaluation. The fluid is of large enough volume to be amenable to thoracentesis. 2. 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 This has increased very slightly from approximately 1.7- 1.8 cm in 2015. Electronically Signed   By: Aletta Edouard M.D.   On: 07/08/2017 21:14   Ir Replc Gastro/colonic Tube Percut W/fluoro  Result Date: 07/09/2017 CLINICAL DATA:  Tongue carcinoma. Inadvertent removal of the percutaneous gastrostomy catheter, initially placed 03/01/2017. Continued need for enteral feeding support. EXAM: GASTROSTOMY CATHETER REPLACEMENT FLUOROSCOPY TIME:  0.3 minutes, 81  uGym2 DAP TECHNIQUE: The procedure, risks, benefits, and alternatives were explained to the patient. Questions regarding the procedure were encouraged and answered. The patient understands and consents to the procedure. The external portion of the gastrostomy tube tract had constricted somewhat post catheter dislodgement such that bedside replacement was not feasible. The site was prepped and draped in usual sterile fashion. Under fluoroscopic guidance, an angled 5 [place] Kumpe catheter was advanced through the tract into the gastric lumen, position confirmed with a small contrast injection under fluoroscopy. Catheter exchanged over short Amplatz wire for a vascular  dilator which facilitated advancement of 18 French balloon retention gastrostomy catheter. The  balloon was inflated with 7 mL sterile saline. The patient tolerated the procedure well. Small contrast injection confirms appropriate positioning. The external bumper was applied and the catheter was flushed. COMPLICATIONS: COMPLICATIONS none IMPRESSION: 1. Technically successful replacement of 14 French balloon retention gastrostomy catheter under fluoroscopy. Electronically Signed   By: Lucrezia Europe M.D.   On: 07/09/2017 11:50   Ir Thoracentesis Asp Pleural Space W/img Guide  Result Date: 07/09/2017 CLINICAL DATA:  Tongue carcinoma.  Large right pleural effusion. EXAM: EXAM THORACENTESIS WITH ULTRASOUND TECHNIQUE: The procedure, risks (including but not limited to bleeding, infection, organ damage ), benefits, and alternatives were explained to the patient. Questions regarding the procedure were encouraged and answered. The patient understands and consents to the procedure. Survey ultrasound of the right hemithorax was performed and an appropriate skin entry site was localized. Site was marked, prepped with chlorhexidine, draped in usual sterile fashion, infiltrated locally with 1% lidocaine. The 7 cm Yueh sheath needle was advanced into the pleural space. Amber pleural fluid returned. 1 L was removed. Sample sent for the requested laboratory studies. The patient tolerated procedure well. COMPLICATIONS: COMPLICATIONS None immediate IMPRESSION: Technically successful ultrasound-guided right thoracentesis. Follow-up chest radiograph pending. Electronically Signed   By: Lucrezia Europe M.D.   On: 07/09/2017 11:53       PHYSICAL EXAM Cachectic frail middle aged Caucasian male not in distress. . Afebrile. Head is nontraumatic. Neck is supple without bruit.    Cardiac exam no murmur or gallop. Lungs are clear to auscultation. Distal pulses are well felt. He has a peg tube for feeding Neurologic Exam: Awake alert oriented x 3. follows most commands but speech difficult to understand  And dysarthric  CN II-XII  grossly intact DTRs: 2+ and symmetric. No hyperreflexia  Sensory: intact to light touch Motor: 5/5 strength in upper, 5/5 in lower extremities, normal muscle tone Cerebellar:normal FNF Gait deferred   ASSESSMENT/PLAN [name] is a 66 y.o. male with history of SCC s/p partial glossectomy, remote history of TIA, dysphagia presenting with altered mental status and found to have stroke and hypercalcemia   Stroke:  right parietal lobe infarct   Resultant  Acute encephalopathy now resolved MRI head  Punctate focus of reduced diffusion in right parietal lobe compatible with acute/early subacute infarction. No acute hemorrhage.  CT angio- pending   2D Echo  pending  LDL  Pending   HgbA1c pending  lovenox for VTE prophylaxis  Diet full liquid Room service appropriate? Yes; Fluid consistency: Thin  aspirin 81 mg daily prior to admission, now on aspirin 325 mg daily  Patient counseled to be compliant with his antithrombotic medications  Ongoing aggressive stroke risk factor management  Therapy recommendations:  pending  Disposition:  pending  Hypertension  Stable  Permissive hypertension (OK if < 220/120) but gradually normalize in 5-7 days  Long-term BP goal normotensive  Hyperlipidemia  Home meds: none  LDL pending, goal < 70  Diabetes  HgbA1c pending, goal < 7.0  Other Stroke Risk Factors  Advanced age  he quit smoking about 6 months ago. His smoking use included Cigarettes. He has a 60.00 pack-year smoking history.  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 Agree with note above.  He presented with transient confusion now resolved unclear asto related to hypercalcemia versus small punctate right pareital embolic cortical infarct.Recommend stroke w/u and may need TEE/prolonged cardiac monitoring if no source found on initial work up. Greater than 50% time during this 35 minute visit was spent on counselling and coordination of care about his presentation, discussion about stroke work up, risk factors and prevention and treatment discussion and answering questions. Antony Contras, MD Medical Director [place] Pager: [phone] 07/10/2017 7:05 PM  To contact Stroke Continuity provider, please refer to [email or web address] After hours, contact General Neurology

## 2017-07-10 NOTE — Progress Notes (Addendum)
Nutrition Follow-up  Late Entry for 07/10/17 due to downtime  DOCUMENTATION CODES:   Severe malnutrition in context of chronic illness  INTERVENTION:   Monitor magnesium, potassium, and phosphorus daily for at least 3 days, MD to replete as needed, as pt is at risk for refeeding syndrome given severe malnutrition, not meeting estimated nutritional needs for the past 3 months and low Mg levels.  -Continue Osmolite 1.5 @ 15 ml/hr via PEG and increase by 10 ml every 24 hours to goal rate of 65 ml/hr.   Tube feeding regimen at goal rate provides 2340 kcal (100% of needs), 97 grams of protein, and 1188 ml of H2O.   -Continue Ensure Enlive po BID, each supplement provides 350 kcal and 20 grams of protein  NUTRITION DIAGNOSIS:   Malnutrition (severe) related to chronic illness, cancer and cancer related treatments as evidenced by percent weight loss, energy intake < or equal to 75% for > or equal to 1 month, severe depletion of body fat, severe depletion of muscle mass.  Ongoing  GOAL:   Patient will meet greater than or equal to 90% of their needs  Progressing  MONITOR:   PO intake, Labs, Weight trends, Supplement acceptance, TF tolerance, I & O's  REASON FOR ASSESSMENT:   Consult Enteral/tube feeding initiation and management  ASSESSMENT:    66 y.o. male is admitted to the hospital after presentation to the [facility] with confusion and malignant hypercalcemia.  8/7- x-fer to [facility] due to stroke and neuro evaluation; pt pulled PEG out; s/p PEG replacement and rt thoracentesis (1 L fluid extracted) by IR  Per neurology note, pt with punctate focus of restricted diffusion in the right parietal lobe.   Pt not very communicative at time of visit. He greeted this RD, but did not answer any other questions. RD observed nutrition ambassador offer meal selections at lunch and dinner- pt refused lunch, stating "they already fed me", but did agree to dinner.   Noted Osmolite 1.5  currently infusing via PEG @ 15 ml/hr (regimen providing 432 kcals, 23 grams protein, and 274 ml free water, meeting 20% of estimated kcal needs and 26% of estimated protein needs). Per previous RD note, goal rate of 65 ml/hr.   Labs reviewed: Na: 133, K: 3.0 (on IV supplementation), Phos WDL, Mg: 1.3.   Diet Order:  Diet full liquid Room service appropriate? Yes; Fluid consistency: Thin  Skin:  Reviewed, no issues  Last BM:  07/09/17  Height:   Ht Readings from Last 1 Encounters:  07/08/17 5\' 8"  (1.727 m)    Weight:   Wt Readings from Last 1 Encounters:  07/11/17 130 lb 9.6 oz (59.2 kg)    Ideal Body Weight:  70 kg  BMI:  Body mass index is 19.86 kg/m.  Estimated Nutritional Needs:   Kcal:  2150-2350  Protein:  90-105g  Fluid:  2.3L/day  EDUCATION NEEDS:   Education needs addressed  Madesyn Ast A. Jimmye Norman, RD, LDN, CDE Pager: [phone] After hours Pager: [phone]

## 2017-07-10 NOTE — Progress Notes (Signed)
Patient arrived to the unit via stretcher with [facility].  Patient was transferred from [facility].  Patient is ambulatory with one assist.  Verified arm band. Patient is alert and oriented x 2. (person and place).  Skin assessment complete with Henry Russel RN. Patient coccyx area red but blanchable. Peg tube (patent) to the abdomen.  Chest port (assessed) to the right side.  Normal saline infusing @ 152ml/hr. Placed the patient on telemetry.   No complaints of pain. Ordered the patient SCDs.  Educated the patient the purpose of the bed alarm and how to reach the staff on the unit. Will need reinforcement.  Lowered the bed, placed the call bell within reach and activated the bed alarm.

## 2017-07-10 NOTE — Progress Notes (Signed)
[name]   DOB:[birth year]   XB#:[id number]    Subjective: The patient was  sedated at the time of evaluation. No history was possible.Cytology from pleural fluid is pending. Nutritional feeding was restarted after replacement of feeding tube.  Objective:  Vitals:   07/10/17 0040 07/10/17 1306  BP: (!) 160/82 124/80  Pulse: 82 92  Resp: 18 (!) 24  Temp: 97.8 F (36.6 C) 99.1 F (37.3 C)     Intake/Output Summary (Last 24 hours) at 07/10/17 1804 Last data filed at 07/10/17 1012  Gross per 24 hour  Intake             2240 ml  Output             1200 ml  Net             1040 ml    GENERAL: Sleeping, no distress and appeared comfortable SKIN: Noted skin discoloration around his neck LUNGS: clear to auscultation and percussion with normal breathing effort HEART: regular rate & rhythm and no murmurs and no lower extremity edema   Labs:  Lab Results  Component Value Date   WBC 8.2 07/08/2017   HGB 12.3 (L) 07/08/2017   HCT 37.6 (L) 07/08/2017   MCV 98.7 (H) 07/08/2017   PLT 414 (H) 07/08/2017   NEUTROABS 6.7 (H) 07/08/2017    Lab Results  Component Value Date   NA 131 (L) 07/10/2017   K 3.0 (L) 07/10/2017   CL 94 (L) 07/10/2017   CO2 27 07/10/2017    Studies:  Dg Chest 1 View  Result Date: 07/09/2017 CLINICAL DATA:  Post right thoracentesis EXAM: CHEST 1 VIEW COMPARISON:  06/24/2017 FINDINGS: Moderate right pleural effusion, increased in size from the prior study. No pneumothorax. Compressive atelectasis right lower lobe. Port-A-Cath tip SVC.  Left lung clear. IMPRESSION: No pneumothorax post right thoracentesis. Moderately large right pleural effusion has increased in size significantly since 06/24/2017 Electronically Signed   By: Franchot Gallo M.D.   On: 07/09/2017 12:40   Ct Soft Tissue Neck W Contrast  Result Date: 07/08/2017 CLINICAL DATA:  66 y/o  M; neck mass with history of malignancy. EXAM: CT NECK WITH CONTRAST TECHNIQUE: Multidetector CT imaging of the neck  was performed using the standard protocol following the bolus administration of intravenous contrast. CONTRAST:  166mL ISOVUE-300 IOPAMIDOL (ISOVUE-300) INJECTION 61% COMPARISON:  06/24/2017 CT chest. 01/14/2017 outside PET-CT. 12/28/2016 CT neck. FINDINGS: Pharynx and larynx: Postsurgical changes within the left aspects of the tongue compatible with history of partial left glossectomy. The region of the oral cavity is motion degraded however no discrete enhancing focus is identified to suggest recurrent or residual disease. Salivary glands: The bilateral submandibular gland resection. Normal parotid glands. Thyroid: 3 mm nodule in the right lobe of thyroid. Lymph nodes: Status post bilateral radical neck dissection. No recurrent adenopathy in the right cervical chain identified. There is diffuse infiltrative soft tissue in the left submandibular space and anterior cervical triangle extending from the mandibular angle to the level of laryngeal cartilage. There are several discrete rim enhancing irregular foci: 1. Left submandibular, 17 x 13 mm, series 8: Image 50. 2. Left lateral to hyoid, 15 x 9 mm, 8:52. 3. Lateral to thyroid cartilage, 6 mm, 2:61. Additionally, there rim enhancing lesions anterior to the manubrium measuring 11 x 21 mm on the left and 11 x 15 mm on the right (8:105). Vascular: Small caliber left upper internal jugular vein. Calcific atherosclerosis of carotid bifurcations without significant  stenosis. Limited intracranial: Negative. Visualized orbits: Negative. Mastoids and visualized paranasal sinuses: Moderate bilateral paranasal sinus mucosal thickening. Skeleton: Mild cervical spondylosis.  No high-grade canal stenosis. Upper chest: Large right pleural effusion. Markedly increased in size from prior CT of chest. Other: None. IMPRESSION: 1. Partial left glossectomy. Oral cavity region is motion degraded, however, no discrete enhancing focus is identified to suggest recurrent or residual  disease. 2. Bilateral radical neck dissection. No recurrent adenopathy or mass in right cervical chain identified. 3. Confluent infiltrative soft tissue in the left submandibular space and left anterior cervical triangle extending from angle of mandible to laryngeal cartilage. Contained within confluent infiltrative soft tissue there are several rim enhancing lesions measuring up to 17 mm. Enhancing lesion likely represent residual/recurrent neoplasm. Confluent infiltrative soft tissue may represent infiltrative neoplasm and/or radiation changes. 4. Two rim enhancing lesions anterior to the manubrium measuring up to 21 mm, likely metastasis. 5. Large right pleural effusion, markedly increased from prior CT of chest. Electronically Signed   By: Kristine Garbe M.D.   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 EXAM: MRI HEAD WITHOUT CONTRAST TECHNIQUE: Multiplanar, multiecho pulse sequences of the brain and surrounding structures were obtained without intravenous contrast. COMPARISON:  06/22/2017 CT of the head.  05/07/2008 MRI of the head. FINDINGS: Brain: Punctate focus of reduced diffusion in the right parietal lobe (series 3, image 38). Several nonspecific foci of T2 FLAIR hyperintense signal abnormality in subcortical and periventricular white matter is compatible with mild chronic microvascular ischemic changes for age. Mild brain parenchymal volume loss. Vascular: Normal flow voids. Skull and upper cervical spine: Normal marrow signal. Sinuses/Orbits: Moderate maxillary sinus mucosal thickening. No abnormal signal of mastoid air cells. Normal orbits. Other: None. IMPRESSION: 1. Punctate focus of reduced diffusion in right parietal lobe compatible with acute/early subacute infarction. No acute hemorrhage. 2. Stable chronic microvascular ischemic changes and  parenchymal volume loss of the brain. 3. Moderate maxillary sinus paranasal sinus disease. These results will be called to the ordering clinician or representative by the Radiologist Assistant, and communication documented in the PACS or zVision Dashboard. Electronically Signed   By: Kristine Garbe M.D.   On: 07/09/2017 16:20   Ct Abdomen Pelvis W Contrast  Result Date: 07/08/2017 CLINICAL DATA:  History of squamous cell carcinoma of the tongue. Weight loss, weakness and hypercalcemia. EXAM: CT ABDOMEN AND PELVIS WITH CONTRAST TECHNIQUE: Multidetector CT imaging of the abdomen and pelvis was performed using the standard protocol following bolus administration of intravenous contrast. CONTRAST:  133mL ISOVUE-300 IOPAMIDOL (ISOVUE-300) INJECTION 61% COMPARISON:  CTA of the chest on 06/24/2017, prior CT of the abdomen and pelvis without contrast on 04/19/2017 and prior CT of the abdomen and pelvis with contrast on 02/02/2014. FINDINGS: Lower chest: There is a large right pleural effusion occupying the visible right lower hemithorax and causing compressive atelectasis of the right lower lung. There is suggestion of diffuse pleural enhancement. There was only a small right pleural effusion present on the chest CTA of 06/24/2017. Contralateral left lung base demonstrates small focus of airspace disease in the lateral left lower lobe and some scarring and atelectasis. Hepatobiliary: Some small hepatic cysts appears stable since prior imaging. The gallbladder has been removed. No evidence of biliary ductal dilatation. Pancreas: Unremarkable. No pancreatic ductal dilatation or surrounding inflammatory changes. Spleen: Normal in size without focal abnormality. Adrenals/Urinary  Tract: Left adrenal enlargement appears stable since prior imaging dating back to 2015. Both kidneys have a normal appearance without evidence of hydronephrosis or focal lesion. No calculi identified. The bladder appears unremarkable.  Stomach/Bowel: Gastrostomy tube shows stable positioning in the stomach. There are some fluid-filled small bowel loops without evidence of bowel obstruction or significant ileus. No free air or abscess. 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 This likely is an aneurysm developing at the level of a penetrating ulcer. Reproductive: Prostate is unremarkable. Other: Abdominal wall hernia mesh present without evidence of ventral hernia. No abdominopelvic ascites. Musculoskeletal: No acute or significant osseous findings. IMPRESSION: 1. Development of a large right pleural effusion causing compressive atelectasis of the entire visualized right lower lung and associated with pleural enhancement. Only a small right pleural effusion was present on recent CTA of the chest of 06/24/2017. Pleural metastatic disease is therefore felt to be somewhat unlikely as a cause for rapid accumulation of fluid. This could potentially be infectious in etiology. A full CT of the chest with contrast would be helpful for further evaluation. The fluid is of large enough volume to be amenable to thoracentesis. 2. 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 This has increased very slightly from approximately 1.7- 1.8 cm in 2015. Electronically Signed   By: Aletta Edouard M.D.   On: 07/08/2017 21:14   Ir Replc Gastro/colonic Tube Percut W/fluoro  Result Date: 07/09/2017 CLINICAL DATA:  Tongue carcinoma. Inadvertent removal of the percutaneous gastrostomy catheter, initially placed 03/01/2017. Continued need for enteral feeding support. EXAM: GASTROSTOMY CATHETER REPLACEMENT FLUOROSCOPY TIME:  0.3 minutes, 81  uGym2 DAP TECHNIQUE: The procedure, risks, benefits, and alternatives were explained to the patient. Questions regarding the procedure were encouraged  and answered. The patient understands and consents to the procedure. The external portion of the gastrostomy tube tract had constricted somewhat post catheter dislodgement such that bedside replacement was not feasible. The site was prepped and draped in usual sterile fashion. Under fluoroscopic guidance, an angled 5 [place] Kumpe catheter was advanced through the tract into the gastric lumen, position confirmed with a small contrast injection under fluoroscopy. Catheter exchanged over short Amplatz wire for a vascular dilator which facilitated advancement of 41 French balloon retention gastrostomy catheter. The balloon was inflated with 7 mL sterile saline. The patient tolerated the procedure well. Small contrast injection confirms appropriate positioning. The external bumper was applied and the catheter was flushed. COMPLICATIONS: COMPLICATIONS none IMPRESSION: 1. Technically successful replacement of 14 French balloon retention gastrostomy catheter under fluoroscopy. Electronically Signed   By: Lucrezia Europe M.D.   On: 07/09/2017 11:50   Ir Thoracentesis Asp Pleural Space W/img Guide  Result Date: 07/09/2017 CLINICAL DATA:  Tongue carcinoma.  Large right pleural effusion. EXAM: EXAM THORACENTESIS WITH ULTRASOUND TECHNIQUE: The procedure, risks (including but not limited to bleeding, infection, organ damage ), benefits, and alternatives were explained to the patient. Questions regarding the procedure were encouraged and answered. The patient understands and consents to the procedure. Survey ultrasound of the right hemithorax was performed and an appropriate skin entry site was localized. Site was marked, prepped with chlorhexidine, draped in usual sterile fashion, infiltrated locally with 1% lidocaine. The 7 cm Yueh sheath needle was advanced into the pleural space. Amber pleural fluid returned. 1 L was removed. Sample sent for the requested laboratory studies. The patient tolerated procedure well.  COMPLICATIONS:  COMPLICATIONS None immediate IMPRESSION: Technically successful ultrasound-guided right thoracentesis. Follow-up chest radiograph pending. Electronically Signed   By: Lucrezia Europe M.D.   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 He has lost weight. 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 Recent CT scan of the chest done 2 weeks ago showedno evidence of pulmonary metastasis. CT scan of the neck and abdomen showed large pleural effusion, worrisome for malignant pleural effusion. 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   Abnormal brain imaging suspicious for recent stroke We will defer to neurology for further management  Physical deconditioning He has significant physical deconditioning recently He will benefit from PT once he is more alert  Cachexia ([place]) The patient have signs of muscle wasting and have lost more weight He has no appetite Previously, when he was on steroid treatment, he has excellent appetite I recommend he continue taking mirtazapine Nutritional feeding has been restarted since PEG tube is replaced We will continue low-dose steroid therapy  Dysphagia He has persistent dysphagia We'll start him on IV fluids  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 He is currently undergoing evaluation and treatment with possible hyperbaric chamber treatment in the future  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situational) The patient admits  he is depressed but not suicidal The reactive depression could be due to malignant hypercalcemia I recommend he continues on Remeron I felt that low dose prednisone may perk him up a little bit  Significant electrolyte imbalance Recommend magnesium and potassium replacement therapy  Goals of care Awaiting cytology report If confirmed malignant pleural effusion, given his overall condition, he is not a candidate for further palliative chemotherapy In truth, with persistent malignant hypercalcemia, without chemotherapy, his overall prognosis is very poor. I recommend palliative care consult  Discharge planning Not readyfor discharge until malignant hypercalcemia resolves We will follow  Heath Lark, MD 07/10/2017  6:04 PM

## 2017-07-11 ENCOUNTER — Inpatient Hospital Stay (HOSPITAL_COMMUNITY): Payer: Medicare PPO

## 2017-07-11 ENCOUNTER — Other Ambulatory Visit (HOSPITAL_COMMUNITY): Payer: Self-pay

## 2017-07-11 ENCOUNTER — Encounter (HOSPITAL_COMMUNITY): Payer: Self-pay | Admitting: Radiology

## 2017-07-11 LAB — COMPREHENSIVE METABOLIC PANEL
ALBUMIN: 2.5 g/dL — AB (ref 3.5–5.0)
ALT: 22 U/L (ref 17–63)
ANION GAP: 12 (ref 5–15)
AST: 20 U/L (ref 15–41)
Alkaline Phosphatase: 52 U/L (ref 38–126)
BILIRUBIN TOTAL: 0.7 mg/dL (ref 0.3–1.2)
BUN: 16 mg/dL (ref 6–20)
CHLORIDE: 91 mmol/L — AB (ref 101–111)
CO2: 30 mmol/L (ref 22–32)
Calcium: 12 mg/dL — ABNORMAL HIGH (ref 8.9–10.3)
Creatinine, Ser: 0.65 mg/dL (ref 0.61–1.24)
GFR calc Af Amer: >60 mL/min (ref >60)
GFR calc non Af Amer: >60 mL/min (ref >60)
GLUCOSE: 147 mg/dL — AB (ref 65–99)
POTASSIUM: 2.9 mmol/L — AB (ref 3.5–5.1)
Sodium: 133 mmol/L — ABNORMAL LOW (ref 135–145)
TOTAL PROTEIN: 6.6 g/dL (ref 6.5–8.1)

## 2017-07-11 LAB — CBC
HEMATOCRIT: 35.5 % — AB (ref 39.0–52.0)
HEMOGLOBIN: 11.8 g/dL — AB (ref 13.0–17.0)
MCH: 32.1 pg (ref 26.0–34.0)
MCHC: 33.2 g/dL (ref 30.0–36.0)
MCV: 96.5 fL (ref 78.0–100.0)
Platelets: 526 10*3/uL — ABNORMAL HIGH (ref 150–400)
RBC: 3.68 MIL/uL — ABNORMAL LOW (ref 4.22–5.81)
RDW: 14.9 % (ref 11.5–15.5)
WBC: 11.5 10*3/uL — AB (ref 4.0–10.5)

## 2017-07-11 LAB — HEMOGLOBIN A1C
HEMOGLOBIN A1C: 5.4 % (ref 4.8–5.6)
MEAN PLASMA GLUCOSE: 108.28 mg/dL

## 2017-07-11 LAB — GLUCOSE, CAPILLARY
GLUCOSE-CAPILLARY: 139 mg/dL — AB (ref 65–99)
GLUCOSE-CAPILLARY: 148 mg/dL — AB (ref 65–99)
GLUCOSE-CAPILLARY: 149 mg/dL — AB (ref 65–99)
Glucose-Capillary: 120 mg/dL — ABNORMAL HIGH (ref 65–99)
Glucose-Capillary: 126 mg/dL — ABNORMAL HIGH (ref 65–99)

## 2017-07-11 LAB — PHOSPHORUS: PHOSPHORUS: 3.3 mg/dL (ref 2.5–4.6)

## 2017-07-11 LAB — MAGNESIUM: Magnesium: 1.5 mg/dL — ABNORMAL LOW (ref 1.7–2.4)

## 2017-07-11 LAB — CALCIUM, IONIZED: CALCIUM, IONIZED, SERUM: 7.1 mg/dL — AB (ref 4.5–5.6)

## 2017-07-11 MED ORDER — CALCITONIN (SALMON) 200 UNIT/ACT NA SOLN
1.0000 | Freq: Two times a day (BID) | NASAL | Status: DC
  Administered 2017-07-11 – 2017-07-12 (×2): 1 via NASAL
  Filled 2017-07-11: qty 3.7

## 2017-07-11 MED ORDER — POTASSIUM CHLORIDE 20 MEQ/15ML (10%) PO SOLN
40.0000 meq | ORAL | Status: AC
  Administered 2017-07-11 (×2): 40 meq via ORAL
  Filled 2017-07-11 (×2): qty 30

## 2017-07-11 MED ORDER — OSMOLITE 1.5 CAL PO LIQD
1000.0000 mL | ORAL | Status: DC
  Administered 2017-07-11: 1000 mL
  Filled 2017-07-11 (×3): qty 1000

## 2017-07-11 MED ORDER — MORPHINE SULFATE (PF) 2 MG/ML IV SOLN
1.0000 mg | INTRAVENOUS | Status: DC | PRN
  Administered 2017-07-11 (×2): 1 mg via INTRAVENOUS
  Filled 2017-07-11 (×2): qty 1

## 2017-07-11 MED ORDER — FUROSEMIDE 10 MG/ML IJ SOLN
20.0000 mg | Freq: Once | INTRAMUSCULAR | Status: AC
  Administered 2017-07-11: 20 mg via INTRAVENOUS
  Filled 2017-07-11: qty 2

## 2017-07-11 MED ORDER — POTASSIUM CHLORIDE 20 MEQ/15ML (10%) PO SOLN
40.0000 meq | Freq: Every day | ORAL | Status: DC
  Administered 2017-07-11 – 2017-07-12 (×2): 40 meq via ORAL
  Filled 2017-07-11 (×2): qty 30

## 2017-07-11 MED ORDER — FUROSEMIDE 40 MG PO TABS
40.0000 mg | ORAL_TABLET | Freq: Every day | ORAL | Status: DC
  Administered 2017-07-11 – 2017-07-12 (×2): 40 mg via ORAL
  Filled 2017-07-11 (×2): qty 1

## 2017-07-11 MED ORDER — ENOXAPARIN SODIUM 40 MG/0.4ML ~~LOC~~ SOLN
40.0000 mg | SUBCUTANEOUS | Status: DC
  Administered 2017-07-11: 40 mg via SUBCUTANEOUS
  Filled 2017-07-11: qty 0.4

## 2017-07-11 NOTE — Progress Notes (Signed)
STROKE TEAM PROGRESS NOTE   HISTORY OF PRESENT ILLNESS (per record)   [name] is a 66 y.o. 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 He was transferred to [facility] long for further evaluation. He was disoriented on exam. An MRI of the brain was done for his altered mental status, which showed a punctate area of restricted diffusion in the right parietal lobe. For that reason, he was transferred to [facility]. Patient was very sleepy when I examined him around 1:15 AM. He followed most commands.  Patient was not administered IV t-PA secondary to out of window.    LKW: At least 24 hours to 48 hours from the time of exam tpa given?: no, way outside the window, also presented with nonspecific encephalopathic presentation Premorbid modified Rankin scale (mRS): 0   SUBJECTIVE (INTERVAL HISTORY)  No acute events overnight, He has dysarthria at baseline following tongue surgery for cancer. Seems more alert today Still is hypercalcemic. Due to poor prognosis, will pursue limited stroke workup. TEE is not recommended.   OBJECTIVE Temp:  [98 F (36.7 C)-98.3 F (36.8 C)] 98.3 F (36.8 C) (08/09 0510) Pulse Rate:  [86-93] 93 (08/09 0510) Cardiac Rhythm: Normal sinus rhythm;Heart block (08/09 0700) Resp:  [18-19] 18 (08/09 0510) BP: (113-126)/(66-80) 113/66 (08/09 0510) SpO2:  [93 %] 93 % (08/09 0510) Weight:  [130 lb 9.6 oz (59.2 kg)] 130 lb 9.6 oz (59.2 kg) (08/09 0444)  CBC:   Recent Labs Lab 07/08/17 0816 07/11/17 0434  WBC 8.2 11.5*  NEUTROABS 6.7*  --   HGB 12.3* 11.8*  HCT 37.6* 35.5*  MCV 98.7* 96.5  PLT 414* 526*    Basic Metabolic  Panel:   Recent Labs Lab 07/10/17 0803 07/11/17 0434  NA 131* 133*  K 3.0* 2.9*  CL 94* 91*  CO2 27 30  GLUCOSE 117* 147*  BUN 10 16  CREATININE 0.58* 0.65  CALCIUM 11.4* 12.0*  MG 1.3* 1.5*  PHOS 2.7 3.3    Lipid Panel:     Component Value Date/Time   CHOL 137 07/10/2017 1439   TRIG 137 07/10/2017 1439   HDL 44 07/10/2017 1439   CHOLHDL 3.1 07/10/2017 1439   VLDL 27 07/10/2017 1439   LDLCALC 66 07/10/2017 1439   HgbA1c:  Lab Results  Component Value Date   HGBA1C 5.4 07/11/2017   Urine Drug Screen:     Component Value Date/Time   LABOPIA NONE DETECTED 02/12/2013 2231   COCAINSCRNUR NONE DETECTED 02/12/2013 2231   LABBENZ NONE DETECTED 02/12/2013 2231   AMPHETMU NONE DETECTED 02/12/2013 2231   THCU NONE DETECTED 02/12/2013 2231   LABBARB NONE DETECTED 02/12/2013 2231    Alcohol Level     Component Value Date/Time   ETH <10 01/06/2015 0905    IMAGING  Ct Angio Head W Or Wo Contrast  Result Date: 07/11/2017 CLINICAL DATA:  Followup stroke. History of head and neck cancer, hypertension, hyperlipidemia. EXAM: CT ANGIOGRAPHY HEAD AND NECK TECHNIQUE: Multidetector CT imaging of the head and neck was performed using the standard protocol during bolus administration of intravenous contrast. Multiplanar CT image reconstructions and MIPs were obtained to evaluate the vascular anatomy. Carotid stenosis measurements (when applicable) are obtained utilizing NASCET criteria,  using the distal internal carotid diameter as the denominator. CONTRAST:  50 cc Isovue 370 COMPARISON:  MRI of the head July 09, 2017 and CT HEAD June 13, 2017 an CT neck July 08, 2017 FINDINGS: CT HEAD FINDINGS BRAIN: No intraparenchymal hemorrhage, mass effect nor midline shift. Moderate ventriculomegaly on the basis of global parenchymal brain volume loss as there is overall enlargement of cerebral sulci and cerebellar folia. Patchy supratentorial white matter hypodensities. No acute large vascular  territory infarcts. Symmetric mildly prominent extra-axial spaces, unchanged. Basal cisterns are patent. VASCULAR: Moderate calcific atherosclerosis of the carotid siphons. SKULL: No skull fracture. Dural thickening calcification on the RIGHT with bilateral calvarial thinning. No significant scalp soft tissue swelling. SINUSES/ORBITS: Mild paranasal sinus mucosal thickening. Mastoid air cells are well aerated. The included ocular globes and orbital contents are non-suspicious. OTHER: Patient is edentulous. CTA NECK AORTIC ARCH: Normal appearance of the thoracic arch, normal branch pattern. Mild calcific atherosclerosis aortic arch. The origins of the innominate, left Common carotid artery and subclavian artery are widely patent. RIGHT CAROTID SYSTEM: Common carotid artery is widely patent mild eccentric calcific atherosclerosis. Normal appearance of the carotid bifurcation without hemodynamically significant stenosis by NASCET criteria, mild calcific atherosclerosis. Normal appearance of the included internal carotid artery, tonsillar loop. LEFT CAROTID SYSTEM: Common carotid artery is widely patent, mild intimal thickening. Normal appearance of the carotid bifurcation without hemodynamically significant stenosis by NASCET criteria, mild calcific atherosclerosis. Normal appearance of the included internal carotid artery, tonsillar loop. VERTEBRAL ARTERIES:Codominant vertebral artery's. Normal appearance of the vertebral arteries, which appear widely patent. SKELETON: No acute osseous process though bone windows have not been submitted. OTHER NECK: Large RIGHT pleural effusion, slightly decreased from prior CT. RIGHT chest Port-A-Cath. Centrilobular emphysema. Status post bilateral neck dissection and LEFT hemiglossectomy. CTA HEAD ANTERIOR CIRCULATION: Patent cervical internal carotid arteries, petrous, cavernous and supra clinoid internal carotid arteries. Calcific atherosclerosis resulting in mild stenosis  bilateral supraclinoid internal carotid artery's. Widely patent anterior communicating artery. Patent anterior and middle cerebral arteries, mild to moderate luminal irregularity. No large vessel occlusion, significant stenosis, contrast extravasation or aneurysm. POSTERIOR CIRCULATION: Patent vertebral arteries, vertebrobasilar junction and basilar artery, as well as main branch vessels. Eccentric calcific atherosclerosis LEFT proximate V4 segment resulting in mild stenosis, calcification better demonstrated on prior CT neck. Patent posterior cerebral arteries, moderate luminal regularity. Severe stenosis LEFT mid P1 segment. No large vessel occlusion, contrast extravasation or aneurysm. VENOUS SINUSES: Major dural venous sinuses are patent though not tailored for evaluation on this angiographic examination. ANATOMIC VARIANTS: None. DELAYED PHASE: No abnormal intracranial enhancement. MIP images reviewed. IMPRESSION: CT HEAD: 1. No acute intracranial process ; patient's known acute infarcts not apparent on CT. 2. Moderate parenchymal brain volume loss, advanced for age. Moderate chronic small vessel ischemic disease. CTA NECK: 1. Atherosclerosis without hemodynamically significant stenosis or acute vascular process. 2. Large RIGHT pleural effusion, decreased from prior CT. 3. Status post bilateral neck dissections and LEFT hemiglossectomy. CTA HEAD: 1. No emergent large vessel occlusion. 2. Severe stenosis LEFT P1 segment in a background of moderate intracranial atherosclerosis. Aortic Atherosclerosis (ICD10-I70.0) and Emphysema (ICD10-J43.9). Electronically Signed   By: Elon Alas M.D.   On: 07/11/2017 00:47   Dg Chest 2 View  Result Date: 07/11/2017 CLINICAL DATA:  Pleural effusion and shortness of breath EXAM: CHEST  2 VIEW COMPARISON:  July 09, 2017 FINDINGS: There is a sizable right pleural effusion which appears slightly increased compared to most recent study. There is compressive atelectasis on  the right. There may also be a degree of consolidation in the posterior segment right upper lobe and portions of the right middle and lower lobes. Left lung is clear. Heart is upper normal in size with pulmonary vascularity within normal limits. There is aortic atherosclerosis. There is a Port-A-Cath with the tip in the superior vena cava near the cavoatrial junction. No pneumothorax. IMPRESSION: Sizable right pleural effusion with areas of compressive atelectasis and possible consolidation on the right. Left lung clear. Stable cardiac silhouette. There is aortic atherosclerosis. No pneumothorax. Aortic Atherosclerosis (ICD10-I70.0). Electronically Signed   By: Lowella Grip III M.D.   On: 07/11/2017 08:16   Ct Angio Neck W Or Wo Contrast  Result Date: 07/11/2017 CLINICAL DATA:  Followup stroke. History of head and neck cancer, hypertension, hyperlipidemia. EXAM: CT ANGIOGRAPHY HEAD AND NECK TECHNIQUE: Multidetector CT imaging of the head and neck was performed using the standard protocol during bolus administration of intravenous contrast. Multiplanar CT image reconstructions and MIPs were obtained to evaluate the vascular anatomy. Carotid stenosis measurements (when applicable) are obtained utilizing NASCET criteria, using the distal internal carotid diameter as the denominator. CONTRAST:  50 cc Isovue 370 COMPARISON:  MRI of the head July 09, 2017 and CT HEAD June 13, 2017 an CT neck July 08, 2017 FINDINGS: CT HEAD FINDINGS BRAIN: No intraparenchymal hemorrhage, mass effect nor midline shift. Moderate ventriculomegaly on the basis of global parenchymal brain volume loss as there is overall enlargement of cerebral sulci and cerebellar folia. Patchy supratentorial white matter hypodensities. No acute large vascular territory infarcts. Symmetric mildly prominent extra-axial spaces, unchanged. Basal cisterns are patent. VASCULAR: Moderate calcific atherosclerosis of the carotid siphons. SKULL: No skull  fracture. Dural thickening calcification on the RIGHT with bilateral calvarial thinning. No significant scalp soft tissue swelling. SINUSES/ORBITS: Mild paranasal sinus mucosal thickening. Mastoid air cells are well aerated. The included ocular globes and orbital contents are non-suspicious. OTHER: Patient is edentulous. CTA NECK AORTIC ARCH: Normal appearance of the thoracic arch, normal branch pattern. Mild calcific atherosclerosis aortic arch. The origins of the innominate, left Common carotid artery and subclavian artery are widely patent. RIGHT CAROTID SYSTEM: Common carotid artery is widely patent mild eccentric calcific atherosclerosis. Normal appearance of the carotid bifurcation without hemodynamically significant stenosis by NASCET criteria, mild calcific atherosclerosis. Normal appearance of the included internal carotid artery, tonsillar loop. LEFT CAROTID SYSTEM: Common carotid artery is widely patent, mild intimal thickening. Normal appearance of the carotid bifurcation without hemodynamically significant stenosis by NASCET criteria, mild calcific atherosclerosis. Normal appearance of the included internal carotid artery, tonsillar loop. VERTEBRAL ARTERIES:Codominant vertebral artery's. Normal appearance of the vertebral arteries, which appear widely patent. SKELETON: No acute osseous process though bone windows have not been submitted. OTHER NECK: Large RIGHT pleural effusion, slightly decreased from prior CT. RIGHT chest Port-A-Cath. Centrilobular emphysema. Status post bilateral neck dissection and LEFT hemiglossectomy. CTA HEAD ANTERIOR CIRCULATION: Patent cervical internal carotid arteries, petrous, cavernous and supra clinoid internal carotid arteries. Calcific atherosclerosis resulting in mild stenosis bilateral supraclinoid internal carotid artery's. Widely patent anterior communicating artery. Patent anterior and middle cerebral arteries, mild to moderate luminal irregularity. No large vessel  occlusion, significant stenosis, contrast extravasation or aneurysm. POSTERIOR CIRCULATION: Patent vertebral arteries, vertebrobasilar junction and basilar artery, as well as main branch vessels. Eccentric calcific atherosclerosis LEFT proximate V4 segment resulting in mild stenosis, calcification better demonstrated on prior CT neck. Patent posterior cerebral arteries, moderate luminal regularity. Severe stenosis LEFT mid P1 segment. No large vessel occlusion, contrast extravasation  or aneurysm. VENOUS SINUSES: Major dural venous sinuses are patent though not tailored for evaluation on this angiographic examination. ANATOMIC VARIANTS: None. DELAYED PHASE: No abnormal intracranial enhancement. MIP images reviewed. IMPRESSION: CT HEAD: 1. No acute intracranial process ; patient's known acute infarcts not apparent on CT. 2. Moderate parenchymal brain volume loss, advanced for age. Moderate chronic small vessel ischemic disease. CTA NECK: 1. Atherosclerosis without hemodynamically significant stenosis or acute vascular process. 2. Large RIGHT pleural effusion, decreased from prior CT. 3. Status post bilateral neck dissections and LEFT hemiglossectomy. CTA HEAD: 1. No emergent large vessel occlusion. 2. Severe stenosis LEFT P1 segment in a background of moderate intracranial atherosclerosis. Aortic Atherosclerosis (ICD10-I70.0) and Emphysema (ICD10-J43.9). Electronically Signed   By: Elon Alas M.D.   On: 07/11/2017 00:47   Mr Brain Wo Contrast  Result Date: 07/09/2017 CLINICAL DATA:  66 y/o M; history of squamous cell carcinoma of the tongue with altered level of consciousness. EXAM: MRI HEAD WITHOUT CONTRAST TECHNIQUE: Multiplanar, multiecho pulse sequences of the brain and surrounding structures were obtained without intravenous contrast. COMPARISON:  06/22/2017 CT of the head.  05/07/2008 MRI of the head. FINDINGS: Brain: Punctate focus of reduced diffusion in the right parietal lobe (series 3, image 38).  Several nonspecific foci of T2 FLAIR hyperintense signal abnormality in subcortical and periventricular white matter is compatible with mild chronic microvascular ischemic changes for age. Mild brain parenchymal volume loss. Vascular: Normal flow voids. Skull and upper cervical spine: Normal marrow signal. Sinuses/Orbits: Moderate maxillary sinus mucosal thickening. No abnormal signal of mastoid air cells. Normal orbits. Other: None. IMPRESSION: 1. Punctate focus of reduced diffusion in right parietal lobe compatible with acute/early subacute infarction. No acute hemorrhage. 2. Stable chronic microvascular ischemic changes and parenchymal volume loss of the brain. 3. Moderate maxillary sinus paranasal sinus disease. These results will be called to the ordering clinician or representative by the Radiologist Assistant, and communication documented in the PACS or zVision Dashboard. Electronically Signed   By: Kristine Garbe M.D.   On: 07/09/2017 16:20       PHYSICAL EXAM Cachectic frail middle aged Caucasian male not in distress. . Afebrile. Head is nontraumatic. Neck is supple without bruit.    Cardiac exam no murmur or gallop. Lungs are clear to auscultation. Distal pulses are well felt. He has a peg tube for feeding Neurologic Exam: Awake alert oriented x 3. follows most commands but speech difficult to understand  And dysarthric  CN II-XII grossly intact DTRs: 2+ and symmetric. No hyperreflexia  Sensory: intact to light touch Motor: 5/5 strength in upper, 5/5 in lower extremities, normal muscle tone Cerebellar:normal FNF Gait deferred   ASSESSMENT/PLAN [name] is a 66 y.o. male with history of SCC s/p partial glossectomy, remote history of TIA, dysphagia presenting with altered mental status and found to have stroke and hypercalcemia   Stroke:  right parietal lobe infarct possibly embolic source undetermined -cancer related hypercoagulability versus marantic endocarditis  but given patient's poor general medical state will not pursue aggressive evaluation for source  Resultant  Acute encephalopathy now resolved MRI head  Punctate focus of reduced diffusion in right parietal lobe compatible with acute/early subacute infarction. No acute hemorrhage.  CT angio- no large vessel stenosis in the neck. Severe stenoses of left P1 segment posterior cerebral artery and moderate intracranial atherosclerosis.  2D Echo  pending  LDL  66  HgbA1c 5.4  lovenox for VTE prophylaxis Diet full liquid Room service appropriate? Yes; Fluid consistency: Thin  aspirin 81 mg daily prior to admission, now on aspirin 325 mg daily  Patient counseled to be compliant with his antithrombotic medications  Ongoing aggressive stroke risk factor management  Therapy recommendations:  Home health pt  Disposition:  hme  Hypertension  Stable  Permissive hypertension (OK if < 220/120) but gradually normalize in 5-7 days  Long-term BP goal normotensive  Hyperlipidemia  Home meds: none  LDL 66, goal < 70  Diabetes  HgbA1c 5.4, goal < 7.0  Other Stroke Risk Factors  Advanced age  he quit smoking about 6 months ago. His smoking use included Cigarettes. He has a 60.00 pack-year smoking history.  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 Agree with note above. Continue aspirin and will not pursue aggressive further  stroke workup for source of embolism given his poor general medical status and possible malignant pleural effusion. No family available at the bedside for discussion. Discussed with Dr. Broadus John. Stroke team will sign off. Kindly call for questions. Antony Contras, MD Medical Director [facility] Pager: [phone] 07/11/2017 4:48 PM  To contact Stroke Continuity provider, please refer to [email or web address] After hours, contact General Neurology

## 2017-07-11 NOTE — Progress Notes (Signed)
Late entry due to EPIC system down.    07/10/17 1630  PT Visit Information  Last PT Received On 07/10/17  Reason Eval/Treat Not Completed Fatigue/lethargy limiting ability to participate   [facility], [place], DPT [phone]

## 2017-07-11 NOTE — Progress Notes (Signed)
[name]   DOB:[birth year]   DP#:[id number]    Subjective: He is more alert today. No reported confusion. He denies chest pain or shortness of breath. He is very weak overall  Objective:  Vitals:   07/11/17 1525 07/11/17 1530  BP: 99/60 102/66  Pulse: 92 91  Resp: 18 18  Temp: 98.4 F (36.9 C)   SpO2: 90% 92%     Intake/Output Summary (Last 24 hours) at 07/11/17 1726 Last data filed at 07/11/17 1250  Gross per 24 hour  Intake              810 ml  Output              870 ml  Net              -60 ml    GENERAL:alert, no distress and comfortable SKIN: ote is skin discoloration around his neck EYES: normal, Conjunctiva are pink and non-injected, sclera clear Musculoskeletal:no cyanosis of digits and no clubbing  NEURO: alert & oriented x 3 with fluent speech, no focal motor/sensory deficits   Labs:  Lab Results  Component Value Date   WBC 11.5 (H) 07/11/2017   HGB 11.8 (L) 07/11/2017   HCT 35.5 (L) 07/11/2017   MCV 96.5 07/11/2017   PLT 526 (H) 07/11/2017   NEUTROABS 6.7 (H) 07/08/2017    Lab Results  Component Value Date   NA 133 (L) 07/11/2017   K 2.9 (L) 07/11/2017   CL 91 (L) 07/11/2017   CO2 30 07/11/2017    Studies:  Ct Angio Head W Or Wo Contrast  Result Date: 07/11/2017 CLINICAL DATA:  Followup stroke. History of head and neck cancer, hypertension, hyperlipidemia. EXAM: CT ANGIOGRAPHY HEAD AND NECK TECHNIQUE: Multidetector CT imaging of the head and neck was performed using the standard protocol during bolus administration of intravenous contrast. Multiplanar CT image reconstructions and MIPs were obtained to evaluate the vascular anatomy. Carotid stenosis measurements (when applicable) are obtained utilizing NASCET criteria, using the distal internal carotid diameter as the denominator. CONTRAST:  50 cc Isovue 370 COMPARISON:  MRI of the head July 09, 2017 and CT HEAD June 13, 2017 an CT neck July 08, 2017 FINDINGS: CT HEAD FINDINGS BRAIN: No  intraparenchymal hemorrhage, mass effect nor midline shift. Moderate ventriculomegaly on the basis of global parenchymal brain volume loss as there is overall enlargement of cerebral sulci and cerebellar folia. Patchy supratentorial white matter hypodensities. No acute large vascular territory infarcts. Symmetric mildly prominent extra-axial spaces, unchanged. Basal cisterns are patent. VASCULAR: Moderate calcific atherosclerosis of the carotid siphons. SKULL: No skull fracture. Dural thickening calcification on the RIGHT with bilateral calvarial thinning. No significant scalp soft tissue swelling. SINUSES/ORBITS: Mild paranasal sinus mucosal thickening. Mastoid air cells are well aerated. The included ocular globes and orbital contents are non-suspicious. OTHER: Patient is edentulous. CTA NECK AORTIC ARCH: Normal appearance of the thoracic arch, normal branch pattern. Mild calcific atherosclerosis aortic arch. The origins of the innominate, left Common carotid artery and subclavian artery are widely patent. RIGHT CAROTID SYSTEM: Common carotid artery is widely patent mild eccentric calcific atherosclerosis. Normal appearance of the carotid bifurcation without hemodynamically significant stenosis by NASCET criteria, mild calcific atherosclerosis. Normal appearance of the included internal carotid artery, tonsillar loop. LEFT CAROTID SYSTEM: Common carotid artery is widely patent, mild intimal thickening. Normal appearance of the carotid bifurcation without hemodynamically significant stenosis by NASCET criteria, mild calcific atherosclerosis. Normal appearance of the included internal carotid artery, tonsillar loop. VERTEBRAL  ARTERIES:Codominant vertebral artery's. Normal appearance of the vertebral arteries, which appear widely patent. SKELETON: No acute osseous process though bone windows have not been submitted. OTHER NECK: Large RIGHT pleural effusion, slightly decreased from prior CT. RIGHT chest Port-A-Cath.  Centrilobular emphysema. Status post bilateral neck dissection and LEFT hemiglossectomy. CTA HEAD ANTERIOR CIRCULATION: Patent cervical internal carotid arteries, petrous, cavernous and supra clinoid internal carotid arteries. Calcific atherosclerosis resulting in mild stenosis bilateral supraclinoid internal carotid artery's. Widely patent anterior communicating artery. Patent anterior and middle cerebral arteries, mild to moderate luminal irregularity. No large vessel occlusion, significant stenosis, contrast extravasation or aneurysm. POSTERIOR CIRCULATION: Patent vertebral arteries, vertebrobasilar junction and basilar artery, as well as main branch vessels. Eccentric calcific atherosclerosis LEFT proximate V4 segment resulting in mild stenosis, calcification better demonstrated on prior CT neck. Patent posterior cerebral arteries, moderate luminal regularity. Severe stenosis LEFT mid P1 segment. No large vessel occlusion, contrast extravasation or aneurysm. VENOUS SINUSES: Major dural venous sinuses are patent though not tailored for evaluation on this angiographic examination. ANATOMIC VARIANTS: None. DELAYED PHASE: No abnormal intracranial enhancement. MIP images reviewed. IMPRESSION: CT HEAD: 1. No acute intracranial process ; patient's known acute infarcts not apparent on CT. 2. Moderate parenchymal brain volume loss, advanced for age. Moderate chronic small vessel ischemic disease. CTA NECK: 1. Atherosclerosis without hemodynamically significant stenosis or acute vascular process. 2. Large RIGHT pleural effusion, decreased from prior CT. 3. Status post bilateral neck dissections and LEFT hemiglossectomy. CTA HEAD: 1. No emergent large vessel occlusion. 2. Severe stenosis LEFT P1 segment in a background of moderate intracranial atherosclerosis. Aortic Atherosclerosis (ICD10-I70.0) and Emphysema (ICD10-J43.9). Electronically Signed   By: Elon Alas M.D.   On: 07/11/2017 00:47   Dg Chest 2  View  Result Date: 07/11/2017 CLINICAL DATA:  Pleural effusion and shortness of breath EXAM: CHEST  2 VIEW COMPARISON:  July 09, 2017 FINDINGS: There is a sizable right pleural effusion which appears slightly increased compared to most recent study. There is compressive atelectasis on the right. There may also be a degree of consolidation in the posterior segment right upper lobe and portions of the right middle and lower lobes. Left lung is clear. Heart is upper normal in size with pulmonary vascularity within normal limits. There is aortic atherosclerosis. There is a Port-A-Cath with the tip in the superior vena cava near the cavoatrial junction. No pneumothorax. IMPRESSION: Sizable right pleural effusion with areas of compressive atelectasis and possible consolidation on the right. Left lung clear. Stable cardiac silhouette. There is aortic atherosclerosis. No pneumothorax. Aortic Atherosclerosis (ICD10-I70.0). Electronically Signed   By: Lowella Grip III M.D.   On: 07/11/2017 08:16   Ct Angio Neck W Or Wo Contrast  Result Date: 07/11/2017 CLINICAL DATA:  Followup stroke. History of head and neck cancer, hypertension, hyperlipidemia. EXAM: CT ANGIOGRAPHY HEAD AND NECK TECHNIQUE: Multidetector CT imaging of the head and neck was performed using the standard protocol during bolus administration of intravenous contrast. Multiplanar CT image reconstructions and MIPs were obtained to evaluate the vascular anatomy. Carotid stenosis measurements (when applicable) are obtained utilizing NASCET criteria, using the distal internal carotid diameter as the denominator. CONTRAST:  50 cc Isovue 370 COMPARISON:  MRI of the head July 09, 2017 and CT HEAD June 13, 2017 an CT neck July 08, 2017 FINDINGS: CT HEAD FINDINGS BRAIN: No intraparenchymal hemorrhage, mass effect nor midline shift. Moderate ventriculomegaly on the basis of global parenchymal brain volume loss as there is overall enlargement of cerebral sulci  and cerebellar  folia. Patchy supratentorial white matter hypodensities. No acute large vascular territory infarcts. Symmetric mildly prominent extra-axial spaces, unchanged. Basal cisterns are patent. VASCULAR: Moderate calcific atherosclerosis of the carotid siphons. SKULL: No skull fracture. Dural thickening calcification on the RIGHT with bilateral calvarial thinning. No significant scalp soft tissue swelling. SINUSES/ORBITS: Mild paranasal sinus mucosal thickening. Mastoid air cells are well aerated. The included ocular globes and orbital contents are non-suspicious. OTHER: Patient is edentulous. CTA NECK AORTIC ARCH: Normal appearance of the thoracic arch, normal branch pattern. Mild calcific atherosclerosis aortic arch. The origins of the innominate, left Common carotid artery and subclavian artery are widely patent. RIGHT CAROTID SYSTEM: Common carotid artery is widely patent mild eccentric calcific atherosclerosis. Normal appearance of the carotid bifurcation without hemodynamically significant stenosis by NASCET criteria, mild calcific atherosclerosis. Normal appearance of the included internal carotid artery, tonsillar loop. LEFT CAROTID SYSTEM: Common carotid artery is widely patent, mild intimal thickening. Normal appearance of the carotid bifurcation without hemodynamically significant stenosis by NASCET criteria, mild calcific atherosclerosis. Normal appearance of the included internal carotid artery, tonsillar loop. VERTEBRAL ARTERIES:Codominant vertebral artery's. Normal appearance of the vertebral arteries, which appear widely patent. SKELETON: No acute osseous process though bone windows have not been submitted. OTHER NECK: Large RIGHT pleural effusion, slightly decreased from prior CT. RIGHT chest Port-A-Cath. Centrilobular emphysema. Status post bilateral neck dissection and LEFT hemiglossectomy. CTA HEAD ANTERIOR CIRCULATION: Patent cervical internal carotid arteries, petrous, cavernous and  supra clinoid internal carotid arteries. Calcific atherosclerosis resulting in mild stenosis bilateral supraclinoid internal carotid artery's. Widely patent anterior communicating artery. Patent anterior and middle cerebral arteries, mild to moderate luminal irregularity. No large vessel occlusion, significant stenosis, contrast extravasation or aneurysm. POSTERIOR CIRCULATION: Patent vertebral arteries, vertebrobasilar junction and basilar artery, as well as main branch vessels. Eccentric calcific atherosclerosis LEFT proximate V4 segment resulting in mild stenosis, calcification better demonstrated on prior CT neck. Patent posterior cerebral arteries, moderate luminal regularity. Severe stenosis LEFT mid P1 segment. No large vessel occlusion, contrast extravasation or aneurysm. VENOUS SINUSES: Major dural venous sinuses are patent though not tailored for evaluation on this angiographic examination. ANATOMIC VARIANTS: None. DELAYED PHASE: No abnormal intracranial enhancement. MIP images reviewed. IMPRESSION: CT HEAD: 1. No acute intracranial process ; patient's known acute infarcts not apparent on CT. 2. Moderate parenchymal brain volume loss, advanced for age. Moderate chronic small vessel ischemic disease. CTA NECK: 1. Atherosclerosis without hemodynamically significant stenosis or acute vascular process. 2. Large RIGHT pleural effusion, decreased from prior CT. 3. Status post bilateral neck dissections and LEFT hemiglossectomy. CTA HEAD: 1. No emergent large vessel occlusion. 2. Severe stenosis LEFT P1 segment in a background of moderate intracranial atherosclerosis. Aortic Atherosclerosis (ICD10-I70.0) and Emphysema (ICD10-J43.9). Electronically Signed   By: Elon Alas M.D.   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 He has lost weight. 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 Recent CT scan of the chest  done 2 weeks ago showedno evidence of pulmonary metastasis. CT scan of the neck and abdomen showed large pleural effusion, worrisome for malignant pleural effusion. Fluid cytology from diagnostic and therapeutic thoracentesis is non-diagnostic for malignancy but suspicious His estimated ECOG performance status score is 4 He is not a candidate for further systemic palliative chemotherapy Frankly, I recommend hospice care. 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 With unresolved hypercalcemia, his overall prognosis is very poor We cannot discharge him until definitive plan of care is in place It is very likely that with persistent hypercalcemia, there is no doubt in my mind this is related to recurrence of cancer The most likely site of recurrence is likely within the pleural fluid even though cytology was non-diagnostic Examination also revealed abnormal hardening of the skin and discoloration around his neck, suspicious for local recurrence, especially with CT neck imaging findings  Abnormal brain imaging suspicious for recent stroke We will defer to neurology for further management  Physical deconditioning He has significant physical deconditioning recently He will benefit from PT once he is more alert  Cachexia ([place]) The patient have signs of muscle wasting and have lost more weight He has no appetite 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 Previously,  when he was on steroid treatment, he has excellent appetite I recommend he continue taking mirtazapine Nutritional feeding has been restarted since PEG tube is replaced We will continue low-dose steroid therapy  Dysphagia He has persistent dysphagia We'll start him on IV fluids and he will continue PEG feeding  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 He is currently undergoing evaluation and treatment with possible hyperbaric chamber treatment in the future  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[place]) The patient admits he is depressed but not suicidal The reactive depression could be due to malignant hypercalcemia I recommend he continues on Remeron  Significant electrolyte imbalance Recommend magnesium and potassium replacement therapy  Goals of care I had a long discussion with the patient, his wife and family member over the telephone related to plan of care With his poor performance status, recent stroke, Iunesolved hypercalcemia and weakness, the patient is not a chemotherapy candidate We discussed transfer to another institution for possible second opinion but they decline The patient desired to go home but his wife is not capable to take care of him at home We discussed prognosis With discontinuation of IV fluids and other supportive measures including tube feeding, I estimated his prognosis/survival to be less than 2 weeks I think transfer to [facility] would be appropriate However, the patient and family members are very tearful and it is hard for them to accept drastic changing goals of care They would need time to think about this In the meantime, I recommend we continue full supportive care We discussed CODE STATUS I would highly recommend changing his CODE STATUS to DO NOT RESUSCITATE Again, the patient and family members are not ready I will resume discussion tomorrow If the patient and family members are in agreement, I think  palliative care tomorrow would be appropriate as well  Discharge planning Not readyfor discharge until malignant hypercalcemia resolves  I spent 60 minutes on his care, at least more than 50% counseling face-to-face  Heath Lark, MD 07/11/2017  5:26 PM

## 2017-07-11 NOTE — Progress Notes (Addendum)
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 He follows with Dr. Alvy Bimler. 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 He had complaints of weight loss, weakness. He was transferred to [facility] for further treatment, also found to have large R pleural effusion, due to ongoing disorientation and confusion, MRI brain was completed which showed acute infarction. He was transferred to [facility] for eval by stroke team.  Assessment & Plan:   Hypercalcemia of malignancy -Patient given IVF, calcitonin with initial improvement in calcium levels, but worsening again 13.6 --> 10.9->11.4 , now 12 -unfortunately unable to give bisphosphates due to osteonecrosis of jaw -Ca corrected for low albumin is 12.75 today -s/p adequate hydration, IV lasix now, will increase calcitonin dose -will d/w Dr.Gorsuch if other options available -discussed with Dr.Gorsuch, with worsening hypercalcemia, probable malignancy recurrence, declining performance status, I called wife and updated her abt poor prognosis, Dr.Gorsuch to meet her this evening  Large Right pleural effusion -was not symptomatic, no s/s of infection, metastatic disease always a possibility -underwent Thoracentesis by IR 8/7, 1L of amber fluid drained -I called lab and added on cell count/diff, culture, protein, LDH,  FLuid appears exudative, cultures couldn't be sent due to non sterile container, Cytology with few atypical cells but overall inconclusive  ACute CVA -MRI brain 8/7 revealed punctate focus right parietal lobe compatible with acute/early subacute infarction  -He has hx of previous CVA/TIA years ago, currently on baby aspirin, changed to 325mg  -Consulted neurology, appreciate input -ECHO pending, CTA  neck unremarkable -Pt/OT eval-home health and 24H supervision recommended  Acute metabolic encephalopathy -due to hyerpcalcemia most likely -Tx and less likely that punctate CVA contributing  Hypokalemia -Replace, trend   Hypomagnesemia -Replace, trend   Squamous cell carcinoma of tongue s/p partial left glossectomy -Follows with Dr. Alvy Bimler -s/p PEG -now poor prognosis with hypercalcemia and pleural effusion, stroke  Dysphagia  -Accidentally removed PEG overnight -IR consulted for replacement  Severe malnutrition in context of chronic illness -Per dietitian, TF once PEG replaced. 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 This has increased very slightly from approximately 1.7- 1.8 cm in 2015.  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 per Dr.Gorsuch  DVT prophylaxis: lovenox Code Status: Full Family Communication:  No family at bedside, called and discussed poor prgnosis with wife [name] today, and Dr.Gorsuch to meet her this evening too Disposition Plan: may need Hospice   Consultants:   Oncology  IR  Neuro  Procedures:   None   Antimicrobials:  Anti-infectives    None       Subjective: Breathing ok, feels tired, tolertaing TFs  Objective: Vitals:   07/10/17 1306 07/10/17 2134 07/11/17 0444 07/11/17 0510  BP: 124/80 126/80  113/66  Pulse: 92 86  93  Resp: (!) 24 19  18   Temp: 99.1 F (37.3 C) 98 F (36.7 C)  98.3 F (36.8 C)  TempSrc: Oral Oral  Oral  SpO2: (!) 89% 93%  93%  Weight:   59.2 kg (130 lb 9.6 oz)   Height:        Intake/Output Summary (Last 24 hours)  at 07/11/17 1154 Last data filed at 07/11/17 1035  Gross per 24 hour  Intake              690 ml  Output              870 ml  Net             -180 ml   Filed Weights   07/08/17 1200 07/09/17 0505 07/11/17 0444  Weight: 61 kg (134 lb 7.7 oz)  58 kg (127 lb 13.9 oz) 59.2 kg (130 lb 9.6 oz)    Examination:  Gen: Awake, Alert, frail, chronically ill male, appears much older than 71 HEENT: PERRLA, Neck supple, no JVD Lungs: decreased BS on right CVS: RRR,No Gallops,Rubs or new Murmurs Abd: soft, Non tender, non distended, BS present Extremities: No Cyanosis, Clubbing or edema Skin: no new rashes  Data Reviewed: I have personally reviewed following labs and imaging studies  CBC:  Recent Labs Lab 07/08/17 0816 07/11/17 0434  WBC 8.2 11.5*  NEUTROABS 6.7*  --   HGB 12.3* 11.8*  HCT 37.6* 35.5*  MCV 98.7* 96.5  PLT 414* 700*   Basic Metabolic Panel:  Recent Labs Lab 07/08/17 0816 07/08/17 1517 07/08/17 2129 07/09/17 0509 07/10/17 0803 07/11/17 0434  NA 132*  --  130* 132* 131* 133*  K 3.9  --  3.9 2.8* 3.0* 2.9*  CL  --   --  94* 94* 94* 91*  CO2 30*  --  26 28 27 30   GLUCOSE 116  --  105* 110* 117* 147*  BUN 15.6  --  12 12 10 16   CREATININE 0.8  --  0.55* 0.54* 0.58* 0.65  CALCIUM 13.6* 11.2* 11.1* 10.9* 11.4* 12.0*  MG 1.4* 1.2*  --  1.2* 1.3* 1.5*  PHOS  --  2.9  --  3.1 2.7 3.3   GFR: Estimated Creatinine Clearance: 76.1 mL/min (by C-G formula based on SCr of 0.65 mg/dL). Liver Function Tests:  Recent Labs Lab 07/08/17 0816 07/08/17 2129 07/09/17 0509 07/10/17 0803 07/11/17 0434  AST 50* 45* 29 23 20   ALT 38 31 28 24 22   ALKPHOS 58 51 48 47 52  BILITOT 0.73 1.5* 0.6 0.6 0.7  PROT 7.3 6.6 6.6 6.1* 6.6  ALBUMIN 2.7* 2.5* 2.6* 2.4* 2.5*   No results for input(s): LIPASE, AMYLASE in the last 168 hours. No results for input(s): AMMONIA in the last 168 hours. Coagulation Profile: No results for input(s): INR, PROTIME in the last 168 hours. Cardiac Enzymes: No results for input(s): CKTOTAL, CKMB, CKMBINDEX, TROPONINI in the last 168 hours. BNP (last 3 results) No results for input(s): PROBNP in the last 8760 hours. HbA1C:  Recent Labs  07/11/17 0434  HGBA1C 5.4   CBG:  Recent  Labs Lab 07/10/17 1603 07/10/17 1713 07/10/17 2131 07/11/17 0438 07/11/17 0750  GLUCAP 129* 132* 121* 139* 120*   Lipid Profile:  Recent Labs  07/10/17 1439  CHOL 137  HDL 44  LDLCALC 66  TRIG 137  CHOLHDL 3.1   Thyroid Function Tests:  Recent Labs  07/08/17 2129  TSH 0.482   Anemia Panel: No results for input(s): VITAMINB12, FOLATE, FERRITIN, TIBC, IRON, RETICCTPCT in the last 72 hours. Sepsis Labs: No results for input(s): PROCALCITON, LATICACIDVEN in the last 168 hours.  No results found for this or any previous visit (from the past 240 hour(s)).     Radiology Studies: Ct Angio Head W Or Wo Contrast  Result Date: 07/11/2017 CLINICAL  DATA:  Followup stroke. History of head and neck cancer, hypertension, hyperlipidemia. EXAM: CT ANGIOGRAPHY HEAD AND NECK TECHNIQUE: Multidetector CT imaging of the head and neck was performed using the standard protocol during bolus administration of intravenous contrast. Multiplanar CT image reconstructions and MIPs were obtained to evaluate the vascular anatomy. Carotid stenosis measurements (when applicable) are obtained utilizing NASCET criteria, using the distal internal carotid diameter as the denominator. CONTRAST:  50 cc Isovue 370 COMPARISON:  MRI of the head July 09, 2017 and CT HEAD June 13, 2017 an CT neck July 08, 2017 FINDINGS: CT HEAD FINDINGS BRAIN: No intraparenchymal hemorrhage, mass effect nor midline shift. Moderate ventriculomegaly on the basis of global parenchymal brain volume loss as there is overall enlargement of cerebral sulci and cerebellar folia. Patchy supratentorial white matter hypodensities. No acute large vascular territory infarcts. Symmetric mildly prominent extra-axial spaces, unchanged. Basal cisterns are patent. VASCULAR: Moderate calcific atherosclerosis of the carotid siphons. SKULL: No skull fracture. Dural thickening calcification on the RIGHT with bilateral calvarial thinning. No significant scalp  soft tissue swelling. SINUSES/ORBITS: Mild paranasal sinus mucosal thickening. Mastoid air cells are well aerated. The included ocular globes and orbital contents are non-suspicious. OTHER: Patient is edentulous. CTA NECK AORTIC ARCH: Normal appearance of the thoracic arch, normal branch pattern. Mild calcific atherosclerosis aortic arch. The origins of the innominate, left Common carotid artery and subclavian artery are widely patent. RIGHT CAROTID SYSTEM: Common carotid artery is widely patent mild eccentric calcific atherosclerosis. Normal appearance of the carotid bifurcation without hemodynamically significant stenosis by NASCET criteria, mild calcific atherosclerosis. Normal appearance of the included internal carotid artery, tonsillar loop. LEFT CAROTID SYSTEM: Common carotid artery is widely patent, mild intimal thickening. Normal appearance of the carotid bifurcation without hemodynamically significant stenosis by NASCET criteria, mild calcific atherosclerosis. Normal appearance of the included internal carotid artery, tonsillar loop. VERTEBRAL ARTERIES:Codominant vertebral artery's. Normal appearance of the vertebral arteries, which appear widely patent. SKELETON: No acute osseous process though bone windows have not been submitted. OTHER NECK: Large RIGHT pleural effusion, slightly decreased from prior CT. RIGHT chest Port-A-Cath. Centrilobular emphysema. Status post bilateral neck dissection and LEFT hemiglossectomy. CTA HEAD ANTERIOR CIRCULATION: Patent cervical internal carotid arteries, petrous, cavernous and supra clinoid internal carotid arteries. Calcific atherosclerosis resulting in mild stenosis bilateral supraclinoid internal carotid artery's. Widely patent anterior communicating artery. Patent anterior and middle cerebral arteries, mild to moderate luminal irregularity. No large vessel occlusion, significant stenosis, contrast extravasation or aneurysm. POSTERIOR CIRCULATION: Patent vertebral  arteries, vertebrobasilar junction and basilar artery, as well as main branch vessels. Eccentric calcific atherosclerosis LEFT proximate V4 segment resulting in mild stenosis, calcification better demonstrated on prior CT neck. Patent posterior cerebral arteries, moderate luminal regularity. Severe stenosis LEFT mid P1 segment. No large vessel occlusion, contrast extravasation or aneurysm. VENOUS SINUSES: Major dural venous sinuses are patent though not tailored for evaluation on this angiographic examination. ANATOMIC VARIANTS: None. DELAYED PHASE: No abnormal intracranial enhancement. MIP images reviewed. IMPRESSION: CT HEAD: 1. No acute intracranial process ; patient's known acute infarcts not apparent on CT. 2. Moderate parenchymal brain volume loss, advanced for age. Moderate chronic small vessel ischemic disease. CTA NECK: 1. Atherosclerosis without hemodynamically significant stenosis or acute vascular process. 2. Large RIGHT pleural effusion, decreased from prior CT. 3. Status post bilateral neck dissections and LEFT hemiglossectomy. CTA HEAD: 1. No emergent large vessel occlusion. 2. Severe stenosis LEFT P1 segment in a background of moderate intracranial atherosclerosis. Aortic Atherosclerosis (ICD10-I70.0) and Emphysema (ICD10-J43.9). Electronically Signed  By: Elon Alas M.D.   On: 07/11/2017 00:47   Dg Chest 1 View  Result Date: 07/09/2017 CLINICAL DATA:  Post right thoracentesis EXAM: CHEST 1 VIEW COMPARISON:  06/24/2017 FINDINGS: Moderate right pleural effusion, increased in size from the prior study. No pneumothorax. Compressive atelectasis right lower lobe. Port-A-Cath tip SVC.  Left lung clear. IMPRESSION: No pneumothorax post right thoracentesis. Moderately large right pleural effusion has increased in size significantly since 06/24/2017 Electronically Signed   By: Franchot Gallo M.D.   On: 07/09/2017 12:40   Dg Chest 2 View  Result Date: 07/11/2017 CLINICAL DATA:  Pleural effusion  and shortness of breath EXAM: CHEST  2 VIEW COMPARISON:  July 09, 2017 FINDINGS: There is a sizable right pleural effusion which appears slightly increased compared to most recent study. There is compressive atelectasis on the right. There may also be a degree of consolidation in the posterior segment right upper lobe and portions of the right middle and lower lobes. Left lung is clear. Heart is upper normal in size with pulmonary vascularity within normal limits. There is aortic atherosclerosis. There is a Port-A-Cath with the tip in the superior vena cava near the cavoatrial junction. No pneumothorax. IMPRESSION: Sizable right pleural effusion with areas of compressive atelectasis and possible consolidation on the right. Left lung clear. Stable cardiac silhouette. There is aortic atherosclerosis. No pneumothorax. Aortic Atherosclerosis (ICD10-I70.0). Electronically Signed   By: Lowella Grip III M.D.   On: 07/11/2017 08:16   Ct Angio Neck W Or Wo Contrast  Result Date: 07/11/2017 CLINICAL DATA:  Followup stroke. History of head and neck cancer, hypertension, hyperlipidemia. EXAM: CT ANGIOGRAPHY HEAD AND NECK TECHNIQUE: Multidetector CT imaging of the head and neck was performed using the standard protocol during bolus administration of intravenous contrast. Multiplanar CT image reconstructions and MIPs were obtained to evaluate the vascular anatomy. Carotid stenosis measurements (when applicable) are obtained utilizing NASCET criteria, using the distal internal carotid diameter as the denominator. CONTRAST:  50 cc Isovue 370 COMPARISON:  MRI of the head July 09, 2017 and CT HEAD June 13, 2017 an CT neck July 08, 2017 FINDINGS: CT HEAD FINDINGS BRAIN: No intraparenchymal hemorrhage, mass effect nor midline shift. Moderate ventriculomegaly on the basis of global parenchymal brain volume loss as there is overall enlargement of cerebral sulci and cerebellar folia. Patchy supratentorial white matter  hypodensities. No acute large vascular territory infarcts. Symmetric mildly prominent extra-axial spaces, unchanged. Basal cisterns are patent. VASCULAR: Moderate calcific atherosclerosis of the carotid siphons. SKULL: No skull fracture. Dural thickening calcification on the RIGHT with bilateral calvarial thinning. No significant scalp soft tissue swelling. SINUSES/ORBITS: Mild paranasal sinus mucosal thickening. Mastoid air cells are well aerated. The included ocular globes and orbital contents are non-suspicious. OTHER: Patient is edentulous. CTA NECK AORTIC ARCH: Normal appearance of the thoracic arch, normal branch pattern. Mild calcific atherosclerosis aortic arch. The origins of the innominate, left Common carotid artery and subclavian artery are widely patent. RIGHT CAROTID SYSTEM: Common carotid artery is widely patent mild eccentric calcific atherosclerosis. Normal appearance of the carotid bifurcation without hemodynamically significant stenosis by NASCET criteria, mild calcific atherosclerosis. Normal appearance of the included internal carotid artery, tonsillar loop. LEFT CAROTID SYSTEM: Common carotid artery is widely patent, mild intimal thickening. Normal appearance of the carotid bifurcation without hemodynamically significant stenosis by NASCET criteria, mild calcific atherosclerosis. Normal appearance of the included internal carotid artery, tonsillar loop. VERTEBRAL ARTERIES:Codominant vertebral artery's. Normal appearance of the vertebral arteries, which appear widely patent. SKELETON:  No acute osseous process though bone windows have not been submitted. OTHER NECK: Large RIGHT pleural effusion, slightly decreased from prior CT. RIGHT chest Port-A-Cath. Centrilobular emphysema. Status post bilateral neck dissection and LEFT hemiglossectomy. CTA HEAD ANTERIOR CIRCULATION: Patent cervical internal carotid arteries, petrous, cavernous and supra clinoid internal carotid arteries. Calcific  atherosclerosis resulting in mild stenosis bilateral supraclinoid internal carotid artery's. Widely patent anterior communicating artery. Patent anterior and middle cerebral arteries, mild to moderate luminal irregularity. No large vessel occlusion, significant stenosis, contrast extravasation or aneurysm. POSTERIOR CIRCULATION: Patent vertebral arteries, vertebrobasilar junction and basilar artery, as well as main branch vessels. Eccentric calcific atherosclerosis LEFT proximate V4 segment resulting in mild stenosis, calcification better demonstrated on prior CT neck. Patent posterior cerebral arteries, moderate luminal regularity. Severe stenosis LEFT mid P1 segment. No large vessel occlusion, contrast extravasation or aneurysm. VENOUS SINUSES: Major dural venous sinuses are patent though not tailored for evaluation on this angiographic examination. ANATOMIC VARIANTS: None. DELAYED PHASE: No abnormal intracranial enhancement. MIP images reviewed. IMPRESSION: CT HEAD: 1. No acute intracranial process ; patient's known acute infarcts not apparent on CT. 2. Moderate parenchymal brain volume loss, advanced for age. Moderate chronic small vessel ischemic disease. CTA NECK: 1. Atherosclerosis without hemodynamically significant stenosis or acute vascular process. 2. Large RIGHT pleural effusion, decreased from prior CT. 3. Status post bilateral neck dissections and LEFT hemiglossectomy. CTA HEAD: 1. No emergent large vessel occlusion. 2. Severe stenosis LEFT P1 segment in a background of moderate intracranial atherosclerosis. Aortic Atherosclerosis (ICD10-I70.0) and Emphysema (ICD10-J43.9). Electronically Signed   By: Elon Alas M.D.   On: 07/11/2017 00:47   Mr Brain Wo Contrast  Result Date: 07/09/2017 CLINICAL DATA:  66 y/o M; history of squamous cell carcinoma of the tongue with altered level of consciousness. EXAM: MRI HEAD WITHOUT CONTRAST TECHNIQUE: Multiplanar, multiecho pulse sequences of the brain and  surrounding structures were obtained without intravenous contrast. COMPARISON:  06/22/2017 CT of the head.  05/07/2008 MRI of the head. FINDINGS: Brain: Punctate focus of reduced diffusion in the right parietal lobe (series 3, image 38). Several nonspecific foci of T2 FLAIR hyperintense signal abnormality in subcortical and periventricular white matter is compatible with mild chronic microvascular ischemic changes for age. Mild brain parenchymal volume loss. Vascular: Normal flow voids. Skull and upper cervical spine: Normal marrow signal. Sinuses/Orbits: Moderate maxillary sinus mucosal thickening. No abnormal signal of mastoid air cells. Normal orbits. Other: None. IMPRESSION: 1. Punctate focus of reduced diffusion in right parietal lobe compatible with acute/early subacute infarction. No acute hemorrhage. 2. Stable chronic microvascular ischemic changes and parenchymal volume loss of the brain. 3. Moderate maxillary sinus paranasal sinus disease. These results will be called to the ordering clinician or representative by the Radiologist Assistant, and communication documented in the PACS or zVision Dashboard. Electronically Signed   By: Kristine Garbe M.D.   On: 07/09/2017 16:20      Scheduled Meds: . aspirin  324 mg Oral q morning - 10a  . calcitonin (salmon)  1 spray Alternating Nares BID  . dexamethasone  4 mg Intravenous Q12H  . feeding supplement (ENSURE ENLIVE)  237 mL Oral BID BM  . feeding supplement (OSMOLITE 1.5 CAL)  1,000 mL Per Tube Q24H  . furosemide  40 mg Oral Daily  . insulin aspart  0-5 Units Subcutaneous QHS  . insulin aspart  0-9 Units Subcutaneous TID WC  . mirtazapine  15 mg Oral QHS  . potassium chloride  40 mEq Oral Daily   Continuous  Infusions:    LOS: 3 days    Time spent: 35 minutes   Domenic Polite, MD [facility] Page via [email or web address] Password [email or web address] 07/11/2017, 11:54 AM

## 2017-07-11 NOTE — Progress Notes (Signed)
Physical Therapy Treatment Patient Details Name: [name] MRN: [id number] DOB: 1950-12-25 Today's Date: 07/11/2017    History of Present Illness Pt is a 66 y.o. male with PMHx of squamous cell carcinoma of the throat. Pt presents with rapid weight loss and generalized weakness.  Pt also reports polyuria and appears slightly confused.  Pt went to cancer center on 07/08/17 and was found to have Calcium of 13.6.  Pt was then admitted for treatment of hypercalcemia.    PT Comments    Patient progressing slowly toward PT goals. Initiate gait training with Min-Mod A for balance/safety. Pt anxious about mobility and reports weakness in BLEs limiting distance. Pt scared of what is happening. Tolerated sit to stand exercise EOB for functional strengthening. HR up to 125 bpm during activity. Fatigues. Will continue to follow and progress as tolerated.   Follow Up Recommendations  Home health PT;Supervision/Assistance - 24 hour     Equipment Recommendations  None recommended by PT    Recommendations for Other Services       Precautions / Restrictions Precautions Precautions: Fall Restrictions Weight Bearing Restrictions: No    Mobility  Bed Mobility Overal bed mobility: Needs Assistance Bed Mobility: Rolling;Sidelying to Sit;Sit to Supine Rolling: Supervision Sidelying to sit: Supervision;HOB elevated   Sit to supine: Supervision   General bed mobility comments: Cues for log roll technique to help with abdominal discomfort; increased time and use of rail. No assist neded. + lightheadedness which resolved wihtin 1-2 mins.  Transfers Overall transfer level: Needs assistance Equipment used: Rolling walker (2 wheeled) Transfers: Sit to/from Stand Sit to Stand: Min guard;Min assist         General transfer comment: Min A for first stand progressing to min guard assist with cues for hand placement.   Ambulation/Gait Ambulation/Gait assistance: Min assist Ambulation Distance  (Feet): 4 Feet Assistive device: Rolling walker (2 wheeled) Gait Pattern/deviations: Step-to pattern;Decreased step length - right;Decreased step length - left;Trunk flexed;Shuffle Gait velocity: decreased Gait velocity interpretation: Below normal speed for age/gender General Gait Details: Able to take a few steps forward/backward but declined further ambulation as pt reported feeling weak and scared. HR up to 125 bpm.   Stairs            Wheelchair Mobility    Modified Rankin (Stroke Patients Only)       Balance Overall balance assessment: Needs assistance Sitting-balance support: Feet supported;No upper extremity supported Sitting balance-Leahy Scale: Fair     Standing balance support: During functional activity;Bilateral upper extremity supported Standing balance-Leahy Scale: Poor Standing balance comment: Reliant on UEs for support in standing.                             Cognition Arousal/Alertness: Awake/alert Behavior During Therapy: Anxious Overall Cognitive Status: No family/caregiver present to determine baseline cognitive functioning                                 General Comments: A&Ox4, Pt reports being very scared about what is happening to him. Follows 1-2 step commands. Difficulty trying to use phone despite cues; impaired memory (cannot remember wife's cell phone).      Exercises Other Exercises Other Exercises: Sit to stand x4 from EOB for strengthening.    General Comments        Pertinent Vitals/Pain Pain Assessment: Faces Faces Pain Scale: Hurts a little bit  Pain Location: abdomen at PEG site Pain Descriptors / Indicators: Sore Pain Intervention(s): Monitored during session;Repositioned (warm blanket applied)    Home Living                      Prior Function            PT Goals (current goals can now be found in the care plan section) Progress towards PT goals: Progressing toward goals  (slowly)    Frequency    Min 3X/week      PT Plan Current plan remains appropriate    Co-evaluation              AM-PAC PT "6 Clicks" Daily Activity  Outcome Measure  Difficulty turning over in bed (including adjusting bedclothes, sheets and blankets)?: None Difficulty moving from lying on back to sitting on the side of the bed? : None Difficulty sitting down on and standing up from a chair with arms (e.g., wheelchair, bedside commode, etc,.)?: Total Help needed moving to and from a bed to chair (including a wheelchair)?: A Little Help needed walking in hospital room?: A Lot Help needed climbing 3-5 steps with a railing? : A Lot 6 Click Score: 16    End of Session Equipment Utilized During Treatment: Gait belt Activity Tolerance: Patient limited by pain;Patient limited by fatigue Patient left: in bed;with call bell/phone within reach;with bed alarm set Nurse Communication: Mobility status PT Visit Diagnosis: Difficulty in walking, not elsewhere classified (R26.2);Muscle weakness (generalized) (M62.81);Pain Pain - part of body:  (abdomen)     Time: 1027-2536 PT Time Calculation (min) (ACUTE ONLY): 24 min  Charges:  $Therapeutic Activity: 23-37 mins                    G Codes:       Mylo Red, PT, DPT [phone]     Blake Divine A Kirkland Figg 07/11/2017, 11:06 AM

## 2017-07-12 ENCOUNTER — Telehealth: Payer: Self-pay | Admitting: *Deleted

## 2017-07-12 ENCOUNTER — Inpatient Hospital Stay (HOSPITAL_COMMUNITY): Payer: Medicare PPO

## 2017-07-12 ENCOUNTER — Ambulatory Visit: Payer: Medicare PPO

## 2017-07-12 DIAGNOSIS — I361 Nonrheumatic tricuspid (valve) insufficiency: Secondary | ICD-10-CM

## 2017-07-12 DIAGNOSIS — D649 Anemia, unspecified: Secondary | ICD-10-CM

## 2017-07-12 DIAGNOSIS — R5381 Other malaise: Secondary | ICD-10-CM

## 2017-07-12 LAB — COMPREHENSIVE METABOLIC PANEL
ALBUMIN: 2.4 g/dL — AB (ref 3.5–5.0)
ALT: 20 U/L (ref 17–63)
ANION GAP: 8 (ref 5–15)
AST: 21 U/L (ref 15–41)
Alkaline Phosphatase: 53 U/L (ref 38–126)
BUN: 28 mg/dL — AB (ref 6–20)
CHLORIDE: 94 mmol/L — AB (ref 101–111)
CO2: 33 mmol/L — ABNORMAL HIGH (ref 22–32)
Calcium: 12.7 mg/dL — ABNORMAL HIGH (ref 8.9–10.3)
Creatinine, Ser: 0.7 mg/dL (ref 0.61–1.24)
GFR calc Af Amer: >60 mL/min (ref >60)
GFR calc non Af Amer: >60 mL/min (ref >60)
GLUCOSE: 148 mg/dL — AB (ref 65–99)
POTASSIUM: 4 mmol/L (ref 3.5–5.1)
Sodium: 135 mmol/L (ref 135–145)
Total Bilirubin: 0.4 mg/dL (ref 0.3–1.2)
Total Protein: 6.1 g/dL — ABNORMAL LOW (ref 6.5–8.1)

## 2017-07-12 LAB — CBC
HEMATOCRIT: 34.7 % — AB (ref 39.0–52.0)
HEMOGLOBIN: 11.4 g/dL — AB (ref 13.0–17.0)
MCH: 32.3 pg (ref 26.0–34.0)
MCHC: 32.9 g/dL (ref 30.0–36.0)
MCV: 98.3 fL (ref 78.0–100.0)
Platelets: 531 10*3/uL — ABNORMAL HIGH (ref 150–400)
RBC: 3.53 MIL/uL — ABNORMAL LOW (ref 4.22–5.81)
RDW: 15 % (ref 11.5–15.5)
WBC: 12.5 10*3/uL — AB (ref 4.0–10.5)

## 2017-07-12 LAB — ECHOCARDIOGRAM COMPLETE
CHL CUP PV REG GRAD DIAS: 8 mmHg
CHL CUP REG VEL DIAS: 140 cm/s
CHL CUP RV SYS PRESS: 32 mmHg
CHL CUP TV REG PEAK VELOCITY: 271 cm/s
FS: 36 % (ref 28–44)
Height: 68 in
IV/PV OW: 1.09
LA ID, A-P, ES: 24 mm
LA vol A4C: 13.8 ml
LADIAMINDEX: 1.43 cm/m2
LEFT ATRIUM END SYS DIAM: 24 mm
LV PW d: 11 mm — AB (ref 0.6–1.1)
LV TDI E'LATERAL: 14.9
LV TDI E'MEDIAL: 6.64
LVELAT: 14.9 cm/s
LVOT VTI: 13.9 cm
LVOT area: 4.15 cm2
LVOT diameter: 23 mm
LVOT peak grad rest: 2 mmHg
LVOT peak vel: 74.2 cm/s
LVOTSV: 58 mL
TAPSE: 20.7 mm
TRMAXVEL: 271 cm/s
Weight: 2096 oz

## 2017-07-12 LAB — GLUCOSE, CAPILLARY
GLUCOSE-CAPILLARY: 180 mg/dL — AB (ref 65–99)
GLUCOSE-CAPILLARY: 151 mg/dL — AB (ref 65–99)
Glucose-Capillary: 204 mg/dL — ABNORMAL HIGH (ref 65–99)
Glucose-Capillary: 136 mg/dL — ABNORMAL HIGH (ref 65–99)
Glucose-Capillary: 137 mg/dL — ABNORMAL HIGH (ref 65–99)

## 2017-07-12 LAB — PTH-RELATED PEPTIDE: PTH-related peptide: UNDETERMINED pmol/L

## 2017-07-12 MED ORDER — OXYCODONE HCL 5 MG PO TABS: 5.0000 mg | ORAL_TABLET | Freq: Four times a day (QID) | ORAL | 0 refills | Status: AC | PRN

## 2017-07-12 MED ORDER — DEXAMETHASONE SODIUM PHOSPHATE 4 MG/ML IJ SOLN: 4.0000 mg | Freq: Two times a day (BID) | INTRAMUSCULAR | Status: AC

## 2017-07-12 MED ORDER — ENSURE ENLIVE PO LIQD: 237.0000 mL | Freq: Two times a day (BID) | ORAL | 12 refills | Status: AC

## 2017-07-12 MED ORDER — HEPARIN SOD (PORK) LOCK FLUSH 100 UNIT/ML IV SOLN
500.0000 [IU] | INTRAVENOUS | Status: AC | PRN
  Administered 2017-07-12: 500 [IU]

## 2017-07-12 MED ORDER — FUROSEMIDE 40 MG PO TABS: 40.0000 mg | ORAL_TABLET | Freq: Every day | ORAL | Status: AC

## 2017-07-12 MED ORDER — POTASSIUM CHLORIDE 20 MEQ/15ML (10%) PO SOLN: 40.0000 meq | Freq: Every day | ORAL | 0 refills | Status: AC

## 2017-07-12 MED ORDER — OSMOLITE 1.5 CAL PO LIQD: 1000.0000 mL | ORAL | 0 refills | Status: AC

## 2017-07-12 MED ORDER — INSULIN ASPART 100 UNIT/ML ~~LOC~~ SOLN: 0.0000 [IU] | Freq: Three times a day (TID) | SUBCUTANEOUS | 11 refills | Status: AC

## 2017-07-12 MED ORDER — OSMOLITE 1.5 CAL PO LIQD
1000.0000 mL | ORAL | Status: DC
  Administered 2017-07-12: 1000 mL
  Filled 2017-07-12 (×4): qty 1000

## 2017-07-12 MED ORDER — ASPIRIN 325 MG PO TABS: 325.0000 mg | ORAL_TABLET | Freq: Every day | ORAL | 3 refills | Status: AC

## 2017-07-12 MED ORDER — CALCITONIN (SALMON) 200 UNIT/ACT NA SOLN: 1.0000 | Freq: Two times a day (BID) | NASAL | 12 refills | Status: AC

## 2017-07-12 NOTE — Telephone Encounter (Signed)
Pls stop by if you can

## 2017-07-12 NOTE — Progress Notes (Signed)
Patient is transported to [facility] by [facility] accompanied by his wife and his daughter. Paper work handed to [facility].

## 2017-07-12 NOTE — Progress Notes (Signed)
STROKE TEAM PROGRESS NOTE   HISTORY OF PRESENT ILLNESS (per record)   [name] is a 66 y.o. 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 He was transferred to [facility] long for further evaluation. He was disoriented on exam. An MRI of the brain was done for his altered mental status, which showed a punctate area of restricted diffusion in the right parietal lobe. For that reason, he was transferred to [facility]. Patient was very sleepy when I examined him around 1:15 AM. He followed most commands.  Patient was not administered IV t-PA secondary to out of window.    LKW: At least 24 hours to 48 hours from the time of exam tpa given?: no, way outside the window, also presented with nonspecific encephalopathic presentation Premorbid modified Rankin scale (mRS): 0   SUBJECTIVE (INTERVAL HISTORY)  No acute events overnight, Patient Still is hypercalcemic. Even though the cytology of the pleural fluid is nondiagnostic, most likely this is due to recurrence of cancer and overall the prognosis is very poor. He is recommended to have hospice care as he is not a candidate for further systemic palliative chemotherapy.  Due to poor prognosis, will pursue limited stroke workup. TEE is not recommended. Stroke team will sign off today.     OBJECTIVE Temp:  [98.3 F (36.8 C)-98.4 F (36.9 C)] 98.4 F (36.9 C) (08/10 0510) Pulse Rate:  [91-95] 95 (08/10 0510) Cardiac Rhythm: Normal sinus rhythm;Heart block (08/09 1935) Resp:  [18] 18 (08/10 0510) BP: (99-124)/(60-66) 124/65 (08/10 0510) SpO2:  [90 %-92 %] 90 % (08/09 2136) Weight:  [131 lb (59.4 kg)] 131 lb (59.4  kg) (08/10 0510)  CBC:   Recent Labs Lab 07/08/17 0816 07/11/17 0434 07/12/17 0417  WBC 8.2 11.5* 12.5*  NEUTROABS 6.7*  --   --   HGB 12.3* 11.8* 11.4*  HCT 37.6* 35.5* 34.7*  MCV 98.7* 96.5 98.3  PLT 414* 526* 531*    Basic Metabolic Panel:   Recent Labs Lab 07/10/17 0803 07/11/17 0434 07/12/17 0417  NA 131* 133* 135  K 3.0* 2.9* 4.0  CL 94* 91* 94*  CO2 27 30 33*  GLUCOSE 117* 147* 148*  BUN 10 16 28*  CREATININE 0.58* 0.65 0.70  CALCIUM 11.4* 12.0* 12.7*  MG 1.3* 1.5*  --   PHOS 2.7 3.3  --     Lipid Panel:     Component Value Date/Time   CHOL 137 07/10/2017 1439   TRIG 137 07/10/2017 1439   HDL 44 07/10/2017 1439   CHOLHDL 3.1 07/10/2017 1439   VLDL 27 07/10/2017 1439   LDLCALC 66 07/10/2017 1439   HgbA1c:  Lab Results  Component Value Date   HGBA1C 5.4 07/11/2017   Urine Drug Screen:     Component Value Date/Time   LABOPIA NONE DETECTED 02/12/2013 2231   COCAINSCRNUR NONE DETECTED 02/12/2013 2231   LABBENZ NONE DETECTED 02/12/2013 2231   AMPHETMU NONE DETECTED 02/12/2013 2231   THCU NONE DETECTED 02/12/2013 2231   LABBARB NONE DETECTED 02/12/2013 2231    Alcohol Level     Component Value Date/Time   ETH <10 01/06/2015 0905    IMAGING  Ct Angio Head W Or Wo Contrast  Result Date: 07/11/2017  CLINICAL DATA:  Followup stroke. History of head and neck cancer, hypertension, hyperlipidemia. EXAM: CT ANGIOGRAPHY HEAD AND NECK TECHNIQUE: Multidetector CT imaging of the head and neck was performed using the standard protocol during bolus administration of intravenous contrast. Multiplanar CT image reconstructions and MIPs were obtained to evaluate the vascular anatomy. Carotid stenosis measurements (when applicable) are obtained utilizing NASCET criteria, using the distal internal carotid diameter as the denominator. CONTRAST:  50 cc Isovue 370 COMPARISON:  MRI of the head July 09, 2017 and CT HEAD June 13, 2017 an CT neck July 08, 2017 FINDINGS: CT  HEAD FINDINGS BRAIN: No intraparenchymal hemorrhage, mass effect nor midline shift. Moderate ventriculomegaly on the basis of global parenchymal brain volume loss as there is overall enlargement of cerebral sulci and cerebellar folia. Patchy supratentorial white matter hypodensities. No acute large vascular territory infarcts. Symmetric mildly prominent extra-axial spaces, unchanged. Basal cisterns are patent. VASCULAR: Moderate calcific atherosclerosis of the carotid siphons. SKULL: No skull fracture. Dural thickening calcification on the RIGHT with bilateral calvarial thinning. No significant scalp soft tissue swelling. SINUSES/ORBITS: Mild paranasal sinus mucosal thickening. Mastoid air cells are well aerated. The included ocular globes and orbital contents are non-suspicious. OTHER: Patient is edentulous. CTA NECK AORTIC ARCH: Normal appearance of the thoracic arch, normal branch pattern. Mild calcific atherosclerosis aortic arch. The origins of the innominate, left Common carotid artery and subclavian artery are widely patent. RIGHT CAROTID SYSTEM: Common carotid artery is widely patent mild eccentric calcific atherosclerosis. Normal appearance of the carotid bifurcation without hemodynamically significant stenosis by NASCET criteria, mild calcific atherosclerosis. Normal appearance of the included internal carotid artery, tonsillar loop. LEFT CAROTID SYSTEM: Common carotid artery is widely patent, mild intimal thickening. Normal appearance of the carotid bifurcation without hemodynamically significant stenosis by NASCET criteria, mild calcific atherosclerosis. Normal appearance of the included internal carotid artery, tonsillar loop. VERTEBRAL ARTERIES:Codominant vertebral artery's. Normal appearance of the vertebral arteries, which appear widely patent. SKELETON: No acute osseous process though bone windows have not been submitted. OTHER NECK: Large RIGHT pleural effusion, slightly decreased from prior CT.  RIGHT chest Port-A-Cath. Centrilobular emphysema. Status post bilateral neck dissection and LEFT hemiglossectomy. CTA HEAD ANTERIOR CIRCULATION: Patent cervical internal carotid arteries, petrous, cavernous and supra clinoid internal carotid arteries. Calcific atherosclerosis resulting in mild stenosis bilateral supraclinoid internal carotid artery's. Widely patent anterior communicating artery. Patent anterior and middle cerebral arteries, mild to moderate luminal irregularity. No large vessel occlusion, significant stenosis, contrast extravasation or aneurysm. POSTERIOR CIRCULATION: Patent vertebral arteries, vertebrobasilar junction and basilar artery, as well as main branch vessels. Eccentric calcific atherosclerosis LEFT proximate V4 segment resulting in mild stenosis, calcification better demonstrated on prior CT neck. Patent posterior cerebral arteries, moderate luminal regularity. Severe stenosis LEFT mid P1 segment. No large vessel occlusion, contrast extravasation or aneurysm. VENOUS SINUSES: Major dural venous sinuses are patent though not tailored for evaluation on this angiographic examination. ANATOMIC VARIANTS: None. DELAYED PHASE: No abnormal intracranial enhancement. MIP images reviewed. IMPRESSION: CT HEAD: 1. No acute intracranial process ; patient's known acute infarcts not apparent on CT. 2. Moderate parenchymal brain volume loss, advanced for age. Moderate chronic small vessel ischemic disease. CTA NECK: 1. Atherosclerosis without hemodynamically significant stenosis or acute vascular process. 2. Large RIGHT pleural effusion, decreased from prior CT. 3. Status post bilateral neck dissections and LEFT hemiglossectomy. CTA HEAD: 1. No emergent large vessel occlusion. 2. Severe stenosis LEFT P1 segment in a background of moderate intracranial atherosclerosis. Aortic Atherosclerosis (ICD10-I70.0) and Emphysema (ICD10-J43.9). Electronically Signed  By: Elon Alas M.D.   On: 07/11/2017 00:47    Dg Chest 2 View  Result Date: 07/11/2017 CLINICAL DATA:  Pleural effusion and shortness of breath EXAM: CHEST  2 VIEW COMPARISON:  July 09, 2017 FINDINGS: There is a sizable right pleural effusion which appears slightly increased compared to most recent study. There is compressive atelectasis on the right. There may also be a degree of consolidation in the posterior segment right upper lobe and portions of the right middle and lower lobes. Left lung is clear. Heart is upper normal in size with pulmonary vascularity within normal limits. There is aortic atherosclerosis. There is a Port-A-Cath with the tip in the superior vena cava near the cavoatrial junction. No pneumothorax. IMPRESSION: Sizable right pleural effusion with areas of compressive atelectasis and possible consolidation on the right. Left lung clear. Stable cardiac silhouette. There is aortic atherosclerosis. No pneumothorax. Aortic Atherosclerosis (ICD10-I70.0). Electronically Signed   By: Lowella Grip III M.D.   On: 07/11/2017 08:16   Ct Angio Neck W Or Wo Contrast  Result Date: 07/11/2017 CLINICAL DATA:  Followup stroke. History of head and neck cancer, hypertension, hyperlipidemia. EXAM: CT ANGIOGRAPHY HEAD AND NECK TECHNIQUE: Multidetector CT imaging of the head and neck was performed using the standard protocol during bolus administration of intravenous contrast. Multiplanar CT image reconstructions and MIPs were obtained to evaluate the vascular anatomy. Carotid stenosis measurements (when applicable) are obtained utilizing NASCET criteria, using the distal internal carotid diameter as the denominator. CONTRAST:  50 cc Isovue 370 COMPARISON:  MRI of the head July 09, 2017 and CT HEAD June 13, 2017 an CT neck July 08, 2017 FINDINGS: CT HEAD FINDINGS BRAIN: No intraparenchymal hemorrhage, mass effect nor midline shift. Moderate ventriculomegaly on the basis of global parenchymal brain volume loss as there is overall enlargement of  cerebral sulci and cerebellar folia. Patchy supratentorial white matter hypodensities. No acute large vascular territory infarcts. Symmetric mildly prominent extra-axial spaces, unchanged. Basal cisterns are patent. VASCULAR: Moderate calcific atherosclerosis of the carotid siphons. SKULL: No skull fracture. Dural thickening calcification on the RIGHT with bilateral calvarial thinning. No significant scalp soft tissue swelling. SINUSES/ORBITS: Mild paranasal sinus mucosal thickening. Mastoid air cells are well aerated. The included ocular globes and orbital contents are non-suspicious. OTHER: Patient is edentulous. CTA NECK AORTIC ARCH: Normal appearance of the thoracic arch, normal branch pattern. Mild calcific atherosclerosis aortic arch. The origins of the innominate, left Common carotid artery and subclavian artery are widely patent. RIGHT CAROTID SYSTEM: Common carotid artery is widely patent mild eccentric calcific atherosclerosis. Normal appearance of the carotid bifurcation without hemodynamically significant stenosis by NASCET criteria, mild calcific atherosclerosis. Normal appearance of the included internal carotid artery, tonsillar loop. LEFT CAROTID SYSTEM: Common carotid artery is widely patent, mild intimal thickening. Normal appearance of the carotid bifurcation without hemodynamically significant stenosis by NASCET criteria, mild calcific atherosclerosis. Normal appearance of the included internal carotid artery, tonsillar loop. VERTEBRAL ARTERIES:Codominant vertebral artery's. Normal appearance of the vertebral arteries, which appear widely patent. SKELETON: No acute osseous process though bone windows have not been submitted. OTHER NECK: Large RIGHT pleural effusion, slightly decreased from prior CT. RIGHT chest Port-A-Cath. Centrilobular emphysema. Status post bilateral neck dissection and LEFT hemiglossectomy. CTA HEAD ANTERIOR CIRCULATION: Patent cervical internal carotid arteries, petrous,  cavernous and supra clinoid internal carotid arteries. Calcific atherosclerosis resulting in mild stenosis bilateral supraclinoid internal carotid artery's. Widely patent anterior communicating artery. Patent anterior and middle cerebral arteries, mild to moderate luminal irregularity. No  large vessel occlusion, significant stenosis, contrast extravasation or aneurysm. POSTERIOR CIRCULATION: Patent vertebral arteries, vertebrobasilar junction and basilar artery, as well as main branch vessels. Eccentric calcific atherosclerosis LEFT proximate V4 segment resulting in mild stenosis, calcification better demonstrated on prior CT neck. Patent posterior cerebral arteries, moderate luminal regularity. Severe stenosis LEFT mid P1 segment. No large vessel occlusion, contrast extravasation or aneurysm. VENOUS SINUSES: Major dural venous sinuses are patent though not tailored for evaluation on this angiographic examination. ANATOMIC VARIANTS: None. DELAYED PHASE: No abnormal intracranial enhancement. MIP images reviewed. IMPRESSION: CT HEAD: 1. No acute intracranial process ; patient's known acute infarcts not apparent on CT. 2. Moderate parenchymal brain volume loss, advanced for age. Moderate chronic small vessel ischemic disease. CTA NECK: 1. Atherosclerosis without hemodynamically significant stenosis or acute vascular process. 2. Large RIGHT pleural effusion, decreased from prior CT. 3. Status post bilateral neck dissections and LEFT hemiglossectomy. CTA HEAD: 1. No emergent large vessel occlusion. 2. Severe stenosis LEFT P1 segment in a background of moderate intracranial atherosclerosis. Aortic Atherosclerosis (ICD10-I70.0) and Emphysema (ICD10-J43.9). Electronically Signed   By: Elon Alas M.D.   On: 07/11/2017 00:47       PHYSICAL EXAM Cachectic frail middle aged Caucasian male not in distress. . Afebrile. Head is nontraumatic. Neck is supple without bruit.    Cardiac exam no murmur or gallop. Lungs are  clear to auscultation. Distal pulses are well felt. He has a peg tube for feeding Neurologic Exam: Awake alert oriented x 3. follows most commands but speech difficult to understand  And dysarthric  CN II-XII grossly intact DTRs: 2+ and symmetric. No hyperreflexia  Sensory: intact to light touch Motor: 5/5 strength in upper, 5/5 in lower extremities, normal muscle tone Cerebellar:normal FNF Gait deferred   ASSESSMENT/PLAN [name] is a 66 y.o. male with history of SCC s/p partial glossectomy, remote history of TIA, dysphagia presenting with altered mental status and found to have stroke and hypercalcemia   Stroke:  right parietal lobe infarct possibly embolic source undetermined -cancer related hypercoagulability versus marantic endocarditis but given patient's poor general medical state will not pursue aggressive evaluation for source  Resultant  Acute encephalopathy now resolved MRI head  Punctate focus of reduced diffusion in right parietal lobe compatible with acute/early subacute infarction. No acute hemorrhage.  CT angio- no large vessel stenosis in the neck. Severe stenoses of left P1 segment posterior cerebral artery and moderate intracranial atherosclerosis. 2D Echo  Left ventricle: The cavity size was normal. Systolic function was   normal. The estimated ejection fraction was in the range of 60%   to 65%. Wall motion was normal; there were no regional wall    motion abnormalities.  LDL  66  HgbA1c 5.4  lovenox for VTE prophylaxis Diet full liquid Room service appropriate? Yes; Fluid consistency: Thin  aspirin 81 mg daily prior to admission, now on aspirin 325 mg daily  Patient counseled to be compliant with his antithrombotic medications  Ongoing aggressive stroke risk factor management  Therapy recommendations:  Home health pt  Disposition:  hme  Hypertension  Stable  Permissive hypertension (OK if < 220/120) but gradually normalize in 5-7  days  Long-term BP goal normotensive  Hyperlipidemia  Home meds: none  LDL 66, goal < 70  Diabetes  HgbA1c 5.4, goal < 7.0  Other Stroke Risk Factors  Advanced age  he quit smoking about 6 months ago. His smoking use included Cigarettes. He has a 60.00 pack-year smoking history.  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 Patient's prognosis seems quite poor hence we'll not do further workup for source of embolism. Continue aspirin. Stroke team will sign off. Kindly call for questions. Antony Contras, MD Medical Director [place] Pager: [phone] 07/12/2017 12:37 PM To contact Stroke Continuity provider, please refer to [email or web address] After hours, contact General Neurology

## 2017-07-12 NOTE — Discharge Summary (Signed)
Physician Discharge Summary  [name]:[id number] DOB: [birth year] DOA: 07/08/2017  PCP: Bobby Levels, MD  Admit date: 07/08/2017 Discharge date: 07/12/2017  Time spent: 35  minutes  Recommendations for Outpatient Follow-up:  1. Patient to be transferred to [facility] 2. 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 He follows with Dr. Alvy Bimler. 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 He had complaints of weight loss, weakness. He was transferred to [facility] for further treatment, also found to have large R pleural effusion, due to ongoing disorientation and confusion, MRI brain was completed which showed acute infarction.    Hospital Course:  Hypercalcemia of malignancy -Patient given IVF, calcitonin with initial improvement in calcium [name], but worsening again 13.6 --> 10.9->11.4 , now 12.7 -unfortunately unable to give bisphosphates due to osteonecrosis of jaw -As per Dr.Gorsuch, with worsening hypercalcemia, probable malignancy recurrence, declining performance status, he recommended hospice/palliative care which family is not agreeable to  Large Right pleural effusion -was not symptomatic, no s/s of infection, metastatic disease always a possibility -underwent  Thoracentesis by IR 8/7, 1L of amber fluid drained -cytology with few atypical cells but overall inconclusive. LDH 501, total protein fluid 4.1 g/dL, WBC 101, eosinophils 12. Appearance of fluid is cloudy.  ACute CVA -MRI brain 8/7 revealed punctate focus right parietal lobe compatible with acute/early subacute infarction  -He has hx of previous CVA/TIA years ago, currently on baby aspirin, changed to 325mg  -Neurology was consulted, and patient transferred to [facility]. - Neurology has signed off due to patient's poor prognosis, no further workup for source of embolism was recommended. We'll continue with aspirin 325 mg po daily -   Acute metabolic encephalopathy -due to hyerpcalcemia most likely, improved. -Tx and less likely that punctate CVA contributing  Hypokalemia -Replace, trend   Hypomagnesemia -Replaced, trend   Squamous cell carcinoma of tongue s/p partial left glossectomy -Follows with Dr. Alvy Bimler -s/p PEG -now poor prognosis with hypercalcemia and pleural effusion, stroke. -Patient has wife want to transfer to another facility for second opinion. - Dr. Alvy Bimler called and discussed with Dr. Thera Flake at [facility], patient to be transferred to hematology oncology service.  Dysphagia  -Accidentally removed PEG  -IR consulted for replacement - PEG tube has been replaced  Severe malnutrition in context of chronic illness -Per dietitian, TF once PEG replaced. 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 This has increased very slightly from approximately 1.7- 1.8 cm in 2015.  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 per Dr.Gorsuch   Procedures:  None  Consultations:  Oncology  IR  Neurology  Discharge Exam: Vitals:   07/12/17 0510 07/12/17 1343  BP: 124/65 122/66  Pulse: 95 94   Resp: 18 18  Temp: 98.4 F (36.9 C) 98.2 F (36.8 C)  SpO2:  96%    General: Appears in no acute distress Cardiovascular: S1-S2 regular Respiratory: Clear to auscultation bilaterally  Discharge Instructions   Discharge Instructions    Diet - low sodium heart healthy    Complete by:  As directed    Increase activity slowly    Complete by:  As directed      Current Discharge Medication List    START taking these medications   Details  calcitonin, salmon, (MIACALCIN/FORTICAL) 200 UNIT/ACT nasal spray Place 1 spray into alternate nostrils 2 (two) times daily. Qty: 3.7 mL, Refills: 12   Associated Diagnoses: Hypercalcemia of malignancy    dexamethasone (DECADRON) 4 MG/ML injection Inject 1 mL (4 mg total) into the vein every 12 (twelve) hours. Qty: 1 mL   Associated Diagnoses: Hypercalcemia of malignancy    !! feeding supplement, ENSURE ENLIVE, (ENSURE ENLIVE) LIQD Take 237 mLs by mouth 2 (two) times daily between meals. Qty: 237 mL, Refills: 12    furosemide (LASIX) 40 MG tablet Take 1 tablet (40 mg total) by mouth daily. Qty: 30 tablet    insulin aspart (NOVOLOG) 100 UNIT/ML injection Inject 0-9 Units into the skin 3 (three) times daily with meals. Sliding scale insulin Less than 70 initiate hypoglycemia protocol 70-120  0 units 120-150 1 unit 151-200 2 units 201-250 3 units 251-300 5 units 301-350 7 units 351-400 9 units  Greater than 400 call MD Qty: 10 mL, Refills: 11    !! Nutritional Supplements (FEEDING SUPPLEMENT, OSMOLITE 1.5 CAL,) LIQD Place 1,000 mLs into feeding tube continuous. Refills: 0    oxyCODONE (OXY IR/ROXICODONE) 5 MG immediate release tablet Take 1 tablet (5 mg total) by mouth every 6 (six) hours as needed for moderate pain. Qty: 30 tablet, Refills: 0    potassium chloride 20 MEQ/15ML (10%) SOLN Take 30 mLs (40 mEq total) by mouth daily. Qty: 450 mL, Refills: 0     !! - Potential duplicate medications found. Please discuss with provider.     CONTINUE these medications which have NOT CHANGED   Details  aspirin 81 MG chewable tablet Chew 81 mg by mouth every morning.     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 Qty: 60 tablet, Refills: 9    mirtazapine (REMERON SOL-TAB) 15 MG disintegrating tablet Take 1 tablet (15 mg total) by mouth at bedtime. Qty: 30 tablet, Refills: 11    morphine (ROXANOL) 20 MG/ML concentrated solution Take 1 mL (20 mg total) by mouth every 2 (two) hours as needed for severe pain. Qty: 240 mL, Refills: 0    ondansetron (ZOFRAN) 8 MG tablet Take 1 tablet (8 mg total) by mouth every 8 (eight) hours as needed. Start on the third day after chemotherapy. 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 Latex Rash    Denies airway involvement  . Adhesive [Tape] Itching and Rash  . Codeine Rash    shakes      The results of significant diagnostics from this hospitalization (including imaging, microbiology, ancillary and laboratory) are listed below for reference.    Significant Diagnostic Studies: Ct Angio Head W Or Wo Contrast  Result Date: 07/11/2017 CLINICAL DATA:  Followup stroke. History of head and neck cancer, hypertension, hyperlipidemia. EXAM: CT ANGIOGRAPHY HEAD AND NECK TECHNIQUE: Multidetector CT imaging of the head  and neck was performed using the standard protocol during bolus administration of intravenous contrast. Multiplanar CT image reconstructions and MIPs were obtained to evaluate the vascular anatomy. Carotid stenosis measurements (when applicable) are obtained utilizing NASCET criteria, using the distal internal carotid diameter as the denominator. CONTRAST:  50 cc Isovue 370 COMPARISON:  MRI of the head July 09, 2017 and CT HEAD June 13, 2017 an CT neck July 08, 2017 FINDINGS: CT HEAD FINDINGS BRAIN: No intraparenchymal hemorrhage, mass effect nor midline shift. Moderate ventriculomegaly on the basis of global parenchymal brain volume loss as there is overall enlargement of cerebral sulci and cerebellar folia. Patchy supratentorial white matter hypodensities. No acute large vascular territory infarcts. Symmetric mildly prominent extra-axial spaces, unchanged. Basal cisterns are patent. VASCULAR: Moderate calcific atherosclerosis of the carotid siphons. SKULL: No skull fracture. Dural thickening calcification on the RIGHT with bilateral calvarial thinning. No significant scalp soft tissue swelling. SINUSES/ORBITS: Mild paranasal sinus mucosal thickening. Mastoid air cells are well aerated. The included ocular globes and orbital contents are non-suspicious. OTHER: Patient is edentulous. CTA NECK AORTIC ARCH: Normal appearance of the thoracic arch, normal branch pattern. Mild calcific atherosclerosis aortic arch. The origins of the innominate, left Common carotid artery and subclavian artery are widely patent. RIGHT CAROTID SYSTEM: Common carotid artery is widely patent mild eccentric calcific atherosclerosis. Normal appearance of the carotid bifurcation without hemodynamically significant stenosis by NASCET criteria, mild calcific atherosclerosis. Normal appearance of the included internal carotid artery, tonsillar loop. LEFT CAROTID SYSTEM: Common carotid artery is widely patent, mild intimal thickening. Normal  appearance of the carotid bifurcation without hemodynamically significant stenosis by NASCET criteria, mild calcific atherosclerosis. Normal appearance of the included internal carotid artery, tonsillar loop. VERTEBRAL ARTERIES:Codominant vertebral artery's. Normal appearance of the vertebral arteries, which appear widely patent. SKELETON: No acute osseous process though bone windows have not been submitted. OTHER NECK: Large RIGHT pleural effusion, slightly decreased from prior CT. RIGHT chest Port-A-Cath. Centrilobular emphysema. Status post bilateral neck dissection and LEFT hemiglossectomy. CTA HEAD ANTERIOR CIRCULATION: Patent cervical internal carotid arteries, petrous, cavernous and supra clinoid internal carotid arteries. Calcific atherosclerosis resulting in mild stenosis bilateral supraclinoid internal carotid artery's. Widely patent anterior communicating artery. Patent anterior and middle cerebral arteries, mild to moderate luminal irregularity. No large vessel occlusion, significant stenosis, contrast extravasation or aneurysm. POSTERIOR CIRCULATION: Patent vertebral arteries, vertebrobasilar junction and basilar artery, as well as main branch vessels. Eccentric calcific atherosclerosis LEFT proximate V4 segment resulting in mild stenosis, calcification better demonstrated on prior CT neck. Patent posterior cerebral arteries, moderate luminal regularity. Severe stenosis LEFT mid P1 segment. No large vessel occlusion, contrast extravasation or aneurysm. VENOUS SINUSES: Major dural venous sinuses are patent though not tailored for evaluation on this angiographic examination. ANATOMIC VARIANTS: None. DELAYED  PHASE: No abnormal intracranial enhancement. MIP images reviewed. IMPRESSION: CT HEAD: 1. No acute intracranial process ; patient's known acute infarcts not apparent on CT. 2. Moderate parenchymal brain volume loss, advanced for age. Moderate chronic small vessel ischemic disease. CTA NECK: 1.  Atherosclerosis without hemodynamically significant stenosis or acute vascular process. 2. Large RIGHT pleural effusion, decreased from prior CT. 3. Status post bilateral neck dissections and LEFT hemiglossectomy. CTA HEAD: 1. No emergent large vessel occlusion. 2. Severe stenosis LEFT P1 segment in a background of moderate intracranial atherosclerosis. Aortic Atherosclerosis (ICD10-I70.0) and Emphysema (ICD10-J43.9). Electronically Signed   By: Elon Alas M.D.   On: 07/11/2017 00:47   Dg Chest 1 View  Result Date: 07/09/2017 CLINICAL DATA:  Post right thoracentesis EXAM: CHEST 1 VIEW COMPARISON:  06/24/2017 FINDINGS: Moderate right pleural effusion, increased in size from the prior study. No pneumothorax. Compressive atelectasis right lower lobe. Port-A-Cath tip SVC.  Left lung clear. IMPRESSION: No pneumothorax post right thoracentesis. Moderately large right pleural effusion has increased in size significantly since 06/24/2017 Electronically Signed   By: Franchot Gallo M.D.   On: 07/09/2017 12:40   Dg Chest 2 View  Result Date: 07/11/2017 CLINICAL DATA:  Pleural effusion and shortness of breath EXAM: CHEST  2 VIEW COMPARISON:  July 09, 2017 FINDINGS: There is a sizable right pleural effusion which appears slightly increased compared to most recent study. There is compressive atelectasis on the right. There may also be a degree of consolidation in the posterior segment right upper lobe and portions of the right middle and lower lobes. Left lung is clear. Heart is upper normal in size with pulmonary vascularity within normal limits. There is aortic atherosclerosis. There is a Port-A-Cath with the tip in the superior vena cava near the cavoatrial junction. No pneumothorax. IMPRESSION: Sizable right pleural effusion with areas of compressive atelectasis and possible consolidation on the right. Left lung clear. Stable cardiac silhouette. There is aortic atherosclerosis. No pneumothorax. Aortic  Atherosclerosis (ICD10-I70.0). Electronically Signed   By: Lowella Grip III M.D.   On: 07/11/2017 08:16   Dg Chest 2 View  Result Date: 06/24/2017 CLINICAL DATA:  66 year old with current history of squamous cell carcinoma of the tongue, treated with radiation therapy and chemotherapy in May, 2018, presenting with severe right lower anterior and lateral chest pain with inspiration which began 2 days ago. EXAM: CHEST  2 VIEW COMPARISON:  06/13/2017, 07/24/2014 and earlier, including CT chest 12/28/2016. FINDINGS: Cardiac silhouette upper normal in size, unchanged. Thoracic aorta mildly tortuous and atherosclerotic, unchanged. Hilar and mediastinal contours otherwise unremarkable. Suboptimal inspiration which accounts for linear atelectasis in the lower lobes and right middle lobe. Lungs otherwise clear. Bronchovascular markings normal. No pleural effusions. Right jugular Port-A-Cath tip remains in the lower SVC. Degenerative changes involving the thoracic and upper lumbar spine. IMPRESSION: 1. Suboptimal inspiration accounts for atelectasis in the lower lobes and right middle lobe. No acute cardiopulmonary disease otherwise. 2. Thoracic aortic atherosclerosis. Electronically Signed   By: Evangeline Dakin M.D.   On: 06/24/2017 12:32   Dg Chest 2 View  Result Date: 06/13/2017 CLINICAL DATA:  Fall this morning. Hit head on mattress and bed frame. History of head and neck cancer. EXAM: CHEST  2 VIEW COMPARISON:  CT chest December 28, 2016 FINDINGS: Cardiomediastinal silhouette is normal. No pleural effusions or focal consolidations. Trachea projects midline and there is no pneumothorax. Single lumen RIGHT chest Port-A-Cath catheterization mild hyperinflation. Numerous surgical clips in the neck. Surgical clips in the included  right abdomen compatible with cholecystectomy. Anterior abdominal wall herniorrhaphy. Mild degenerative change of the thoracic spine. IMPRESSION: Hyperinflation, no acute cardiopulmonary  process. Electronically Signed   By: Elon Alas M.D.   On: 06/13/2017 15:38   Ct Head Wo Contrast  Result Date: 06/13/2017 CLINICAL DATA:  Head injury after fall. EXAM: CT HEAD WITHOUT CONTRAST TECHNIQUE: Contiguous axial images were obtained from the base of the skull through the vertex without intravenous contrast. COMPARISON:  CT scan of February 11, 2013. FINDINGS: Brain: Mild diffuse cortical atrophy is noted. Mild chronic ischemic white matter disease is noted. No mass effect or midline shift is noted. Ventricular size is within normal limits. There is no evidence of mass lesion, hemorrhage or acute infarction. Vascular: No hyperdense vessel or unexpected calcification. Skull: Normal. Negative for fracture or focal lesion. Sinuses/Orbits: Mild mucosal thickening of left maxillary sinus is noted. Other: None. IMPRESSION: Mild diffuse cortical atrophy. Mild chronic ischemic white matter disease. No acute intracranial abnormality seen. Electronically Signed   By: Marijo Conception, M.D.   On: 06/13/2017 15:34   Ct Soft Tissue Neck W Contrast  Result Date: 07/08/2017 CLINICAL DATA:  66 y/o  M; neck mass with history of malignancy. EXAM: CT NECK WITH CONTRAST TECHNIQUE: Multidetector CT imaging of the neck was performed using the standard protocol following the bolus administration of intravenous contrast. CONTRAST:  166mL ISOVUE-300 IOPAMIDOL (ISOVUE-300) INJECTION 61% COMPARISON:  06/24/2017 CT chest. 01/14/2017 outside PET-CT. 12/28/2016 CT neck. FINDINGS: Pharynx and larynx: Postsurgical changes within the left aspects of the tongue compatible with history of partial left glossectomy. The region of the oral cavity is motion degraded however no discrete enhancing focus is identified to suggest recurrent or residual disease. Salivary glands: The bilateral submandibular gland resection. Normal parotid glands. Thyroid: 3 mm nodule in the right lobe of thyroid. Lymph nodes: Status post bilateral radical  neck dissection. No recurrent adenopathy in the right cervical chain identified. There is diffuse infiltrative soft tissue in the left submandibular space and anterior cervical triangle extending from the mandibular angle to the level of laryngeal cartilage. There are several discrete rim enhancing irregular foci: 1. Left submandibular, 17 x 13 mm, series 8: Image 50. 2. Left lateral to hyoid, 15 x 9 mm, 8:52. 3. Lateral to thyroid cartilage, 6 mm, 2:61. Additionally, there rim enhancing lesions anterior to the manubrium measuring 11 x 21 mm on the left and 11 x 15 mm on the right (8:105). Vascular: Small caliber left upper internal jugular vein. Calcific atherosclerosis of carotid bifurcations without significant stenosis. Limited intracranial: Negative. Visualized orbits: Negative. Mastoids and visualized paranasal sinuses: Moderate bilateral paranasal sinus mucosal thickening. Skeleton: Mild cervical spondylosis.  No high-grade canal stenosis. Upper chest: Large right pleural effusion. Markedly increased in size from prior CT of chest. Other: None. IMPRESSION: 1. Partial left glossectomy. Oral cavity region is motion degraded, however, no discrete enhancing focus is identified to suggest recurrent or residual disease. 2. Bilateral radical neck dissection. No recurrent adenopathy or mass in right cervical chain identified. 3. Confluent infiltrative soft tissue in the left submandibular space and left anterior cervical triangle extending from angle of mandible to laryngeal cartilage. Contained within confluent infiltrative soft tissue there are several rim enhancing lesions measuring up to 17 mm. Enhancing lesion likely represent residual/recurrent neoplasm. Confluent infiltrative soft tissue may represent infiltrative neoplasm and/or radiation changes. 4. Two rim enhancing lesions anterior to the manubrium measuring up to 21 mm, likely metastasis. 5. Large right pleural effusion, markedly increased  from prior CT  of chest. Electronically Signed   By: Kristine Garbe M.D.   On: 07/08/2017 19:56   Ct Angio Neck W Or Wo Contrast  Result Date: 07/11/2017 CLINICAL DATA:  Followup stroke. History of head and neck cancer, hypertension, hyperlipidemia. EXAM: CT ANGIOGRAPHY HEAD AND NECK TECHNIQUE: Multidetector CT imaging of the head and neck was performed using the standard protocol during bolus administration of intravenous contrast. Multiplanar CT image reconstructions and MIPs were obtained to evaluate the vascular anatomy. Carotid stenosis measurements (when applicable) are obtained utilizing NASCET criteria, using the distal internal carotid diameter as the denominator. CONTRAST:  50 cc Isovue 370 COMPARISON:  MRI of the head July 09, 2017 and CT HEAD June 13, 2017 an CT neck July 08, 2017 FINDINGS: CT HEAD FINDINGS BRAIN: No intraparenchymal hemorrhage, mass effect nor midline shift. Moderate ventriculomegaly on the basis of global parenchymal brain volume loss as there is overall enlargement of cerebral sulci and cerebellar folia. Patchy supratentorial white matter hypodensities. No acute large vascular territory infarcts. Symmetric mildly prominent extra-axial spaces, unchanged. Basal cisterns are patent. VASCULAR: Moderate calcific atherosclerosis of the carotid siphons. SKULL: No skull fracture. Dural thickening calcification on the RIGHT with bilateral calvarial thinning. No significant scalp soft tissue swelling. SINUSES/ORBITS: Mild paranasal sinus mucosal thickening. Mastoid air cells are well aerated. The included ocular globes and orbital contents are non-suspicious. OTHER: Patient is edentulous. CTA NECK AORTIC ARCH: Normal appearance of the thoracic arch, normal branch pattern. Mild calcific atherosclerosis aortic arch. The origins of the innominate, left Common carotid artery and subclavian artery are widely patent. RIGHT CAROTID SYSTEM: Common carotid artery is widely patent mild eccentric calcific  atherosclerosis. Normal appearance of the carotid bifurcation without hemodynamically significant stenosis by NASCET criteria, mild calcific atherosclerosis. Normal appearance of the included internal carotid artery, tonsillar loop. LEFT CAROTID SYSTEM: Common carotid artery is widely patent, mild intimal thickening. Normal appearance of the carotid bifurcation without hemodynamically significant stenosis by NASCET criteria, mild calcific atherosclerosis. Normal appearance of the included internal carotid artery, tonsillar loop. VERTEBRAL ARTERIES:Codominant vertebral artery's. Normal appearance of the vertebral arteries, which appear widely patent. SKELETON: No acute osseous process though bone windows have not been submitted. OTHER NECK: Large RIGHT pleural effusion, slightly decreased from prior CT. RIGHT chest Port-A-Cath. Centrilobular emphysema. Status post bilateral neck dissection and LEFT hemiglossectomy. CTA HEAD ANTERIOR CIRCULATION: Patent cervical internal carotid arteries, petrous, cavernous and supra clinoid internal carotid arteries. Calcific atherosclerosis resulting in mild stenosis bilateral supraclinoid internal carotid artery's. Widely patent anterior communicating artery. Patent anterior and middle cerebral arteries, mild to moderate luminal irregularity. No large vessel occlusion, significant stenosis, contrast extravasation or aneurysm. POSTERIOR CIRCULATION: Patent vertebral arteries, vertebrobasilar junction and basilar artery, as well as main branch vessels. Eccentric calcific atherosclerosis LEFT proximate V4 segment resulting in mild stenosis, calcification better demonstrated on prior CT neck. Patent posterior cerebral arteries, moderate luminal regularity. Severe stenosis LEFT mid P1 segment. No large vessel occlusion, contrast extravasation or aneurysm. VENOUS SINUSES: Major dural venous sinuses are patent though not tailored for evaluation on this angiographic examination. ANATOMIC  VARIANTS: None. DELAYED PHASE: No abnormal intracranial enhancement. MIP images reviewed. IMPRESSION: CT HEAD: 1. No acute intracranial process ; patient's known acute infarcts not apparent on CT. 2. Moderate parenchymal brain volume loss, advanced for age. Moderate chronic small vessel ischemic disease. CTA NECK: 1. Atherosclerosis without hemodynamically significant stenosis or acute vascular process. 2. Large RIGHT pleural effusion, decreased from prior CT. 3. Status post bilateral neck dissections  and LEFT hemiglossectomy. CTA HEAD: 1. No emergent large vessel occlusion. 2. Severe stenosis LEFT P1 segment in a background of moderate intracranial atherosclerosis. Aortic Atherosclerosis (ICD10-I70.0) and Emphysema (ICD10-J43.9). Electronically Signed   By: Elon Alas M.D.   On: 07/11/2017 00:47   Ct Angio Chest Pe W/cm &/or Wo Cm  Result Date: 06/24/2017 CLINICAL DATA:  Chest pain with elevated D-dimer, history of tongue cancer EXAM: CT ANGIOGRAPHY CHEST WITH CONTRAST TECHNIQUE: Multidetector CT imaging of the chest was performed using the standard protocol during bolus administration of intravenous contrast. Multiplanar CT image reconstructions and MIPs were obtained to evaluate the vascular anatomy. CONTRAST:  100 mL Isovue 370 intravenous COMPARISON:  Radiograph 06/24/2017, CT chest 12/28/2016, 07/13/2010 FINDINGS: Cardiovascular: Satisfactory opacification of the pulmonary arteries to the segmental level. No evidence of pulmonary embolism. Aortic atherosclerosis. No aneurysm is seen. Coronary artery calcification. Heart size within normal limits. No large pericardial effusion. Mediastinum/Nodes: Mild mediastinal lymph nodes, left peribronchial lymph node measures 8 mm. Midline trachea. No thyroid mass. Esophagus within normal limits Lungs/Pleura: Moderate emphysema. Small right-sided pleural effusion. Partial consolidation in the right lower lobe. Focal soft tissue density left posterior upper lobe  pleural surface. No pneumothorax. Upper Abdomen: Stable hypodensity in the central liver. No acute abnormality is seen. Musculoskeletal: Degenerative changes. No acute or suspicious bone lesion. Review of the MIP images confirms the above findings. IMPRESSION: 1. Negative for acute pulmonary embolus 2. Small right-sided pleural effusion. Partial atelectasis or mild infiltrate in the right lower lobe. 3. Small focal consolidation left upper lobe posterior pleural surface possibly due to infiltrate or atelectasis, suggestion or interval CT follow-up to assess for resolution. 4. Mild nonspecific mediastinal adenopathy. Aortic Atherosclerosis (ICD10-I70.0) and Emphysema (ICD10-J43.9). Electronically Signed   By: Donavan Foil M.D.   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 EXAM: MRI HEAD WITHOUT CONTRAST TECHNIQUE: Multiplanar, multiecho pulse sequences of the brain and surrounding structures were obtained without intravenous contrast. COMPARISON:  06/22/2017 CT of the head.  05/07/2008 MRI of the head. FINDINGS: Brain: Punctate focus of reduced diffusion in the right parietal lobe (series 3, image 38). Several nonspecific foci of T2 FLAIR hyperintense signal abnormality in subcortical and periventricular white matter is compatible with mild chronic microvascular ischemic changes for age. Mild brain parenchymal volume loss. Vascular: Normal flow voids. Skull and upper cervical spine: Normal marrow signal. Sinuses/Orbits: Moderate maxillary sinus mucosal thickening. No abnormal signal of mastoid air cells. Normal orbits. Other: None. IMPRESSION: 1. Punctate focus of reduced diffusion in right parietal lobe compatible with acute/early subacute infarction. No acute hemorrhage. 2. Stable chronic microvascular ischemic changes and parenchymal volume loss of the brain. 3. Moderate  maxillary sinus paranasal sinus disease. These results will be called to the ordering clinician or representative by the Radiologist Assistant, and communication documented in the PACS or zVision Dashboard. Electronically Signed   By: Kristine Garbe M.D.   On: 07/09/2017 16:20   Ct Abdomen Pelvis W Contrast  Result Date: 07/08/2017 CLINICAL DATA:  History of squamous cell carcinoma of the tongue. Weight loss, weakness and hypercalcemia. EXAM: CT ABDOMEN AND PELVIS WITH CONTRAST TECHNIQUE: Multidetector CT imaging of the abdomen and pelvis was performed using the standard protocol following bolus administration of intravenous contrast. CONTRAST:  190mL ISOVUE-300 IOPAMIDOL (ISOVUE-300) INJECTION 61% COMPARISON:  CTA of the chest on 06/24/2017, prior CT of the  abdomen and pelvis without contrast on 04/19/2017 and prior CT of the abdomen and pelvis with contrast on 02/02/2014. FINDINGS: Lower chest: There is a large right pleural effusion occupying the visible right lower hemithorax and causing compressive atelectasis of the right lower lung. There is suggestion of diffuse pleural enhancement. There was only a small right pleural effusion present on the chest CTA of 06/24/2017. Contralateral left lung base demonstrates small focus of airspace disease in the lateral left lower lobe and some scarring and atelectasis. Hepatobiliary: Some small hepatic cysts appears stable since prior imaging. The gallbladder has been removed. No evidence of biliary ductal dilatation. Pancreas: Unremarkable. No pancreatic ductal dilatation or surrounding inflammatory changes. Spleen: Normal in size without focal abnormality. Adrenals/Urinary Tract: Left adrenal enlargement appears stable since prior imaging dating back to 2015. Both kidneys have a normal appearance without evidence of hydronephrosis or focal lesion. No calculi identified. The bladder appears unremarkable. Stomach/Bowel: Gastrostomy tube shows stable  positioning in the stomach. There are some fluid-filled small bowel loops without evidence of bowel obstruction or significant ileus. No free air or abscess. 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 This likely is an aneurysm developing at the level of a penetrating ulcer. Reproductive: Prostate is unremarkable. Other: Abdominal wall hernia mesh present without evidence of ventral hernia. No abdominopelvic ascites. Musculoskeletal: No acute or significant osseous findings. IMPRESSION: 1. Development of a large right pleural effusion causing compressive atelectasis of the entire visualized right lower lung and associated with pleural enhancement. Only a small right pleural effusion was present on recent CTA of the chest of 06/24/2017. Pleural metastatic disease is therefore felt to be somewhat unlikely as a cause for rapid accumulation of fluid. This could potentially be infectious in etiology. A full CT of the chest with contrast would be helpful for further evaluation. The fluid is of large enough volume to be amenable to thoracentesis. 2. 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 This has increased very slightly from approximately 1.7- 1.8 cm in 2015. Electronically Signed   By: Aletta Edouard M.D.   On: 07/08/2017 21:14   Ir Replc Gastro/colonic Tube Percut W/fluoro  Result Date: 07/09/2017 CLINICAL DATA:  Tongue carcinoma. Inadvertent removal of the percutaneous gastrostomy catheter, initially placed 03/01/2017. Continued need for enteral feeding support. EXAM: GASTROSTOMY CATHETER REPLACEMENT FLUOROSCOPY TIME:  0.3 minutes, 81  uGym2 DAP TECHNIQUE: The procedure, risks, benefits, and alternatives were explained to the patient. Questions regarding the procedure were encouraged and answered. The patient understands and  consents to the procedure. The external portion of the gastrostomy tube tract had constricted somewhat post catheter dislodgement such that bedside replacement was not feasible. The site was prepped and draped in usual sterile fashion. Under fluoroscopic guidance, an angled 5 [place] Kumpe catheter was advanced through the tract into the gastric lumen, position confirmed with a small contrast injection under fluoroscopy. Catheter exchanged over short Amplatz wire for a vascular dilator which facilitated advancement of 3 French balloon retention gastrostomy catheter. The balloon was inflated with 7 mL sterile saline. The patient tolerated the procedure well. Small contrast injection confirms appropriate positioning. The external bumper was applied and the catheter was flushed. COMPLICATIONS: COMPLICATIONS none IMPRESSION: 1. Technically successful replacement of 14 French balloon retention gastrostomy catheter under fluoroscopy. Electronically Signed   By: Lucrezia Europe M.D.   On: 07/09/2017 11:50   Ir Thoracentesis  Asp Pleural Space W/img Guide  Result Date: 07/09/2017 CLINICAL DATA:  Tongue carcinoma.  Large right pleural effusion. EXAM: EXAM THORACENTESIS WITH ULTRASOUND TECHNIQUE: The procedure, risks (including but not limited to bleeding, infection, organ damage ), benefits, and alternatives were explained to the patient. Questions regarding the procedure were encouraged and answered. The patient understands and consents to the procedure. Survey ultrasound of the right hemithorax was performed and an appropriate skin entry site was localized. Site was marked, prepped with chlorhexidine, draped in usual sterile fashion, infiltrated locally with 1% lidocaine. The 7 cm Yueh sheath needle was advanced into the pleural space. Amber pleural fluid returned. 1 L was removed. Sample sent for the requested laboratory studies. The patient tolerated procedure well. COMPLICATIONS: COMPLICATIONS None immediate IMPRESSION:  Technically successful ultrasound-guided right thoracentesis. Follow-up chest radiograph pending. Electronically Signed   By: Lucrezia Europe M.D.   On: 07/09/2017 11:53    Microbiology: No results found for this or any previous visit (from the past 240 hour(s)).   Labs: Basic Metabolic Panel:  Recent Labs Lab 07/08/17 0816 07/08/17 1517 07/08/17 2129 07/09/17 0509 07/10/17 0803 07/11/17 0434 07/12/17 0417  NA 132*  --  130* 132* 131* 133* 135  K 3.9  --  3.9 2.8* 3.0* 2.9* 4.0  CL  --   --  94* 94* 94* 91* 94*  CO2 30*  --  26 28 27 30  33*  GLUCOSE 116  --  105* 110* 117* 147* 148*  BUN 15.6  --  12 12 10 16  28*  CREATININE 0.8  --  0.55* 0.54* 0.58* 0.65 0.70  CALCIUM 13.6* 11.2* 11.1* 10.9* 11.4* 12.0* 12.7*  MG 1.4* 1.2*  --  1.2* 1.3* 1.5*  --   PHOS  --  2.9  --  3.1 2.7 3.3  --    Liver Function Tests:  Recent Labs Lab 07/08/17 2129 07/09/17 0509 07/10/17 0803 07/11/17 0434 07/12/17 0417  AST 45* 29 23 20 21   ALT 31 28 24 22 20   ALKPHOS 51 48 47 52 53  BILITOT 1.5* 0.6 0.6 0.7 0.4  PROT 6.6 6.6 6.1* 6.6 6.1*  ALBUMIN 2.5* 2.6* 2.4* 2.5* 2.4*   No results for input(s): LIPASE, AMYLASE in the last 168 hours. No results for input(s): AMMONIA in the last 168 hours. CBC:  Recent Labs Lab 07/08/17 0816 07/11/17 0434 07/12/17 0417  WBC 8.2 11.5* 12.5*  NEUTROABS 6.7*  --   --   HGB 12.3* 11.8* 11.4*  HCT 37.6* 35.5* 34.7*  MCV 98.7* 96.5 98.3  PLT 414* 526* 531*    CBG:  Recent Labs Lab 07/11/17 2106 07/12/17 0241 07/12/17 0623 07/12/17 0755 07/12/17 1226  GLUCAP 149* 204* 180* 151* 136*       Signed:  Eleonore Chiquito S MD.  [facility] 07/12/2017, 4:05 PM

## 2017-07-12 NOTE — Progress Notes (Signed)
Nutrition Follow-up  DOCUMENTATION CODES:   Severe malnutrition in context of chronic illness  INTERVENTION:   -Continue Osmolite 1.5 @ 65 ml/hr via PEG   Tube feeding regimen at goal rateprovides 2340kcal (100% of needs), 97grams of protein, and 1155m of H2O.   -Continue Ensure Enlive po BID, each supplement provides 350 kcal and 20 grams of protein  NUTRITION DIAGNOSIS:   Malnutrition (severe) related to chronic illness, cancer and cancer related treatments as evidenced by percent weight loss, energy intake < or equal to 75% for > or equal to 1 month, severe depletion of body fat, severe depletion of muscle mass.  Ongoing  GOAL:   Patient will meet greater than or equal to 90% of their needs  Met with TF  MONITOR:   PO intake, Labs, Weight trends, Supplement acceptance, TF tolerance, I & O's  REASON FOR ASSESSMENT:   Consult Enteral/tube feeding initiation and management  ASSESSMENT:    66y.o. male is admitted to the hospital [facility].  [facility] pt pulled PEG out; s/p PEG replacement and rt thoracentesis (1 L fluid extracted) by IR  Pt sleeping soundly at time of visit. RD did not disturb.   Reviewed MD notes; recommending comfort care.   Case discussed with RN, who reports TF was increased to goal rate (Osmolite 1.5 @ 65 ml/hr via PEG) yesterday. He is tolerating TF well. Pt not eating much; will consume a few sips of Ensure supplements, which he does like. RN confirms poor prognosis and reports oncologist will be visiting family later today to further address goals of care.   Labs reviewed: CBGS: 136-204, K and Phos WDL, Mg: 1.5.   Diet Order:  Diet full liquid Room service appropriate? Yes; Fluid consistency: Thin  Skin:  Reviewed, no issues  Last BM:  07/09/17  Height:   Ht Readings from Last 1 Encounters:  07/08/17 '5\' 8"'$  (1.727 m)     Weight:   Wt Readings from Last 1 Encounters:  07/12/17 131 lb (59.4 kg)    Ideal Body Weight:  70 kg  BMI:  Body mass index is 19.92 kg/m.  Estimated Nutritional Needs:   Kcal:  2150-2350  Protein:  90-105g  Fluid:  2.3L/day  EDUCATION NEEDS:   Education needs addressed  Lyndall Windt A. WJimmye Norman RD, LDN, CDE Pager: [phone] hours Pager: [phone]

## 2017-07-12 NOTE — Progress Notes (Signed)
His wife requested the patient to be transferred to [facility] for second opinion I spoke with Dr[facility] Thera Flake [facility], medical oncology service and she has accepted the patient for direct hospital transfer The hospitalist is informed with the plan of care

## 2017-07-12 NOTE — Telephone Encounter (Signed)
Oncology Nurse Navigator Documentation  Returned call to [name] wife in response to her morning VMMs which indicated:  After further discussion with family, they would like referral back to Dr. Hendricks Limes, [facility], for second opinion re further treatment.  [place] insurance coverage expires 07/22/17.  Need letter from Dr. Alvy Bimler indicating she is referring him to Dr. Hendricks Limes, therefor requesting extension of care, asked that letter be faxed to [facility], [place] [facility] at [phone]. Dr. Alvy Bimler informed.  Gayleen Orem, RN, BSN, [place] [facility] Oncology Nurse [place] at [place] [phone]

## 2017-07-12 NOTE — Progress Notes (Signed)
  Echocardiogram 2D Echocardiogram has been performed.  [name] [name] 07/12/2017, 12:22 PM

## 2017-07-12 NOTE — Progress Notes (Signed)
Patient will be transferred to [facility] - room 714, being admitted by Dr. Dorthula Matas. Family at bedside was informed; report was given to nurse Eustace Pen) who is going to receive the patient. [facility] called and request transportation.

## 2017-07-12 NOTE — Care Management Important Message (Signed)
Important Message  Patient Details  Name: [name] MRN: [id number] Date of Birth: September 07, 1951   Medicare Important Message Given:  Yes    Nathen May 07/12/2017, 9:46 AM

## 2017-07-15 ENCOUNTER — Ambulatory Visit: Payer: Medicare PPO

## 2017-07-17 ENCOUNTER — Ambulatory Visit: Payer: Medicare PPO

## 2017-07-19 ENCOUNTER — Ambulatory Visit: Payer: Medicare PPO

## 2017-07-22 ENCOUNTER — Ambulatory Visit: Payer: Medicare PPO

## 2017-07-24 ENCOUNTER — Ambulatory Visit: Payer: Medicare PPO

## 2017-07-26 ENCOUNTER — Ambulatory Visit: Payer: Medicare PPO

## 2017-07-29 ENCOUNTER — Ambulatory Visit: Payer: Medicare PPO

## 2017-07-31 ENCOUNTER — Ambulatory Visit: Payer: Medicare PPO

## 2017-08-01 ENCOUNTER — Ambulatory Visit (HOSPITAL_COMMUNITY): Payer: Non-veteran care

## 2017-08-01 ENCOUNTER — Ambulatory Visit: Payer: Medicare PPO

## 2017-08-01 ENCOUNTER — Telehealth: Payer: Self-pay | Admitting: *Deleted

## 2017-08-01 NOTE — Telephone Encounter (Signed)
Oncology Nurse Navigator Documentation  Called patient's wife for update on his well-being.  She indicated is remains inpatient at [facility], is awaiting transfer to residential hospice with preference for [facility], [place].  I notified Care Team, cancelled his [facility] and [facility] appts.  Gayleen Orem, RN, BSN, [place] Oncology Nurse [place] at [place] [phone]

## 2017-08-02 ENCOUNTER — Ambulatory Visit: Payer: Medicare PPO

## 2017-08-02 ENCOUNTER — Ambulatory Visit: Admission: RE | Admit: 2017-08-02 | Payer: Medicare PPO | Source: Ambulatory Visit | Admitting: Radiation Oncology

## 2017-08-03 DEATH — deceased

## 2017-08-06 ENCOUNTER — Encounter: Payer: Self-pay | Admitting: *Deleted

## 2017-08-06 NOTE — Progress Notes (Signed)
Oncology Nurse Navigator Documentation  Mr. Slowey died with hospice care at [facility], [facility], 08/12/2017.

## 2017-09-13 ENCOUNTER — Other Ambulatory Visit: Payer: Self-pay | Admitting: Nurse Practitioner

## 2018-01-02 IMAGING — CT CT ANGIO HEAD
1 of 12 series · 5 of 33 positions shown · IV contrast (APPLIED)
Comparison: MRI of the head July 09, 2017 and CT HEAD June 13, 2017 an CT neck July 08, 2017

CLINICAL DATA: Followup stroke. History of head and neck cancer,
hypertension, hyperlipidemia.

EXAM:
CT ANGIOGRAPHY HEAD AND NECK
TECHNIQUE: Multidetector CT imaging of the head and neck was performed using
the standard protocol during bolus administration of intravenous
contrast. Multiplanar CT image reconstructions and MIPs were
obtained to evaluate the vascular anatomy. Carotid stenosis
measurements (when applicable) are obtained utilizing NASCET
criteria, using the distal internal carotid diameter as the
denominator.
CONTRAST:  50 cc Isovue 370

[Series 8: ax thins · axial · 0.39mm/px · z∈[-276,-26]mm · 5 of 376 slices shown]
[im 63/376  soft-tissue]
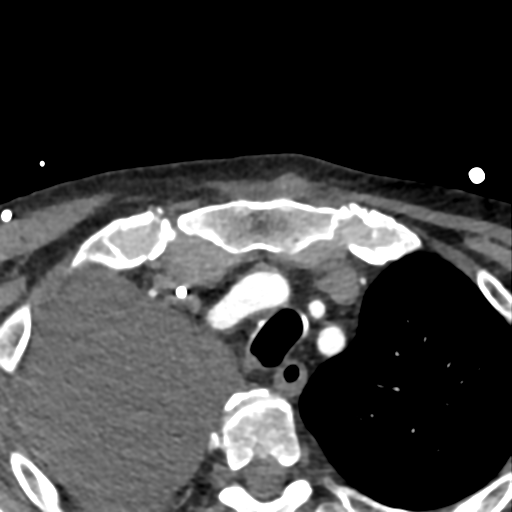
[im 126/376  bone]
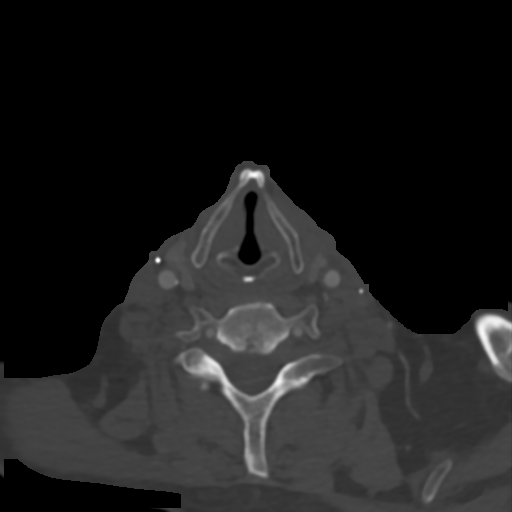
[im 188/376  soft-tissue]
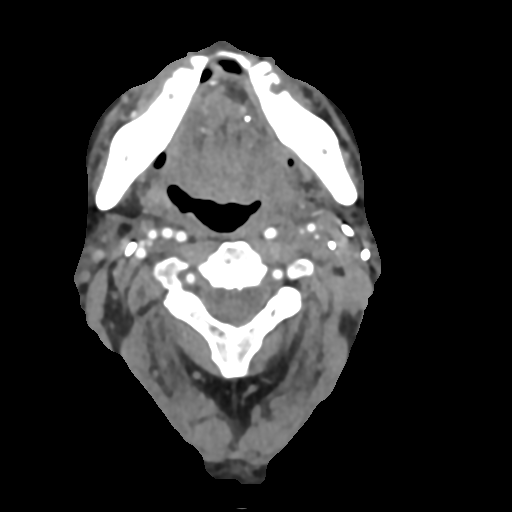
[im 251/376  bone]
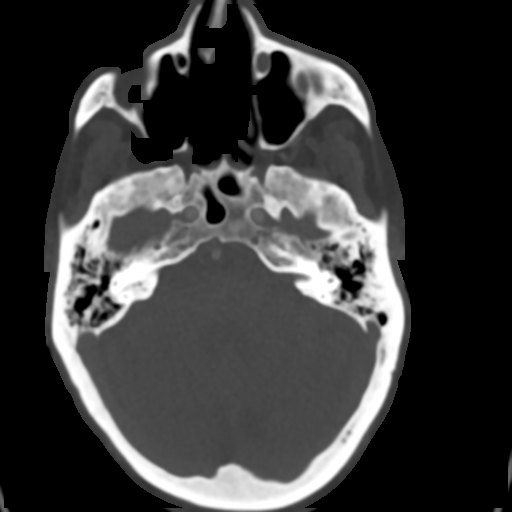
[im 313/376  soft-tissue]
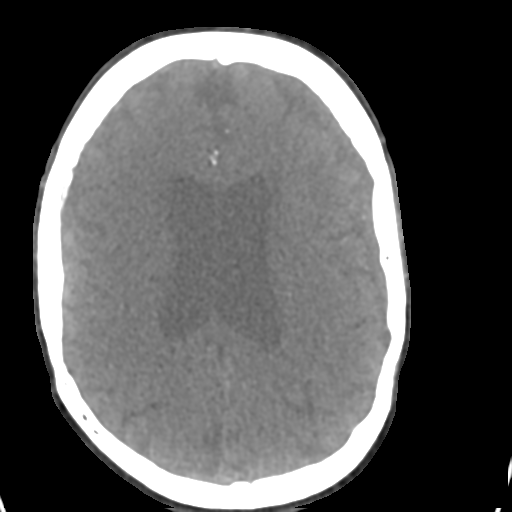

[5 of 33 positions shown; findings below may reference images not displayed]

FINDINGS: CT HEAD FINDINGS

BRAIN: No intraparenchymal hemorrhage, mass effect nor midline
shift. Moderate ventriculomegaly on the basis of global parenchymal
brain volume loss as there is overall enlargement of cerebral sulci
and cerebellar folia. Patchy supratentorial white matter
hypodensities. No acute large vascular territory infarcts. Symmetric
mildly prominent extra-axial spaces, unchanged. Basal cisterns are
patent.

VASCULAR: Moderate calcific atherosclerosis of the carotid siphons.

SKULL: No skull fracture. Dural thickening calcification on the
RIGHT with bilateral calvarial thinning. No significant scalp soft
tissue swelling.

SINUSES/ORBITS: Mild paranasal sinus mucosal thickening. Mastoid air
cells are well aerated. The included ocular globes and orbital
contents are non-suspicious.

OTHER: Patient is edentulous.

CTA NECK

AORTIC ARCH: Normal appearance of the thoracic arch, normal branch
pattern. Mild calcific atherosclerosis aortic arch. The origins of
the innominate, left Common carotid artery and subclavian artery are
widely patent.

RIGHT CAROTID SYSTEM: Common carotid artery is widely patent mild
eccentric calcific atherosclerosis. Normal appearance of the carotid
bifurcation without hemodynamically significant stenosis by NASCET
criteria, mild calcific atherosclerosis. Normal appearance of the
included internal carotid artery, tonsillar loop.

LEFT CAROTID SYSTEM: Common carotid artery is widely patent, mild
intimal thickening. Normal appearance of the carotid bifurcation
without hemodynamically significant stenosis by NASCET criteria,
mild calcific atherosclerosis. Normal appearance of the included
internal carotid artery, tonsillar loop.

VERTEBRAL ARTERIES:Codominant vertebral artery's. Normal appearance
of the vertebral arteries, which appear widely patent.

SKELETON: No acute osseous process though bone windows have not been
submitted.

OTHER NECK: Large RIGHT pleural effusion, slightly decreased from
prior CT. RIGHT chest Port-A-Cath. Centrilobular emphysema. Status
post bilateral neck dissection and LEFT hemiglossectomy.

CTA HEAD

ANTERIOR CIRCULATION: Patent cervical internal carotid arteries,
petrous, cavernous and supra clinoid internal carotid arteries.
Calcific atherosclerosis resulting in mild stenosis bilateral
supraclinoid internal carotid artery's. Widely patent anterior
communicating artery. Patent anterior and middle cerebral arteries,
mild to moderate luminal irregularity.

No large vessel occlusion, significant stenosis, contrast
extravasation or aneurysm.

POSTERIOR CIRCULATION: Patent vertebral arteries, vertebrobasilar
junction and basilar artery, as well as main branch vessels.
Eccentric calcific atherosclerosis LEFT proximate V4 segment
resulting in mild stenosis, calcification better demonstrated on
prior CT neck. Patent posterior cerebral arteries, moderate luminal
regularity. Severe stenosis LEFT mid P1 segment.

No large vessel occlusion, contrast extravasation or aneurysm.

VENOUS SINUSES: Major dural venous sinuses are patent though not
tailored for evaluation on this angiographic examination.

ANATOMIC VARIANTS: None.

DELAYED PHASE: No abnormal intracranial enhancement.

MIP images reviewed.
IMPRESSION: CT HEAD:

1. No acute intracranial process ; patient's known acute infarcts
not apparent on CT.
2. Moderate parenchymal brain volume loss, advanced for age.
Moderate chronic small vessel ischemic disease.
CTA NECK:

1. Atherosclerosis without hemodynamically significant stenosis or
acute vascular process.
2. Large RIGHT pleural effusion, decreased from prior CT.
3. Status post bilateral neck dissections and LEFT hemiglossectomy.
CTA HEAD:

1. No emergent large vessel occlusion.
2. Severe stenosis LEFT P1 segment in a background of moderate
intracranial atherosclerosis.
Aortic Atherosclerosis (2218G-SSO.O) and Emphysema (2218G-YDM.D).
